# Patient Record
Sex: Male | Born: 1955
Health system: Southern US, Community
[De-identification: ages and names within clinical notes are randomized; demographics above are authoritative.]

## PROBLEM LIST (undated history)

## (undated) DIAGNOSIS — R011 Cardiac murmur, unspecified: Secondary | ICD-10-CM

## (undated) DIAGNOSIS — D649 Anemia, unspecified: Secondary | ICD-10-CM

## (undated) DIAGNOSIS — I499 Cardiac arrhythmia, unspecified: Secondary | ICD-10-CM

## (undated) DIAGNOSIS — K635 Polyp of colon: Secondary | ICD-10-CM

## (undated) DIAGNOSIS — M1990 Unspecified osteoarthritis, unspecified site: Secondary | ICD-10-CM

## (undated) DIAGNOSIS — M109 Gout, unspecified: Secondary | ICD-10-CM

## (undated) DIAGNOSIS — T7840XA Allergy, unspecified, initial encounter: Secondary | ICD-10-CM

## (undated) DIAGNOSIS — I10 Essential (primary) hypertension: Secondary | ICD-10-CM

## (undated) DIAGNOSIS — R0602 Shortness of breath: Secondary | ICD-10-CM

## (undated) DIAGNOSIS — K219 Gastro-esophageal reflux disease without esophagitis: Secondary | ICD-10-CM

## (undated) HISTORY — DX: Cardiac murmur, unspecified: R01.1

## (undated) HISTORY — DX: Shortness of breath: R06.02

## (undated) HISTORY — DX: Polyp of colon: K63.5

## (undated) HISTORY — DX: Essential (primary) hypertension: I10

## (undated) HISTORY — DX: Allergy, unspecified, initial encounter: T78.40XA

## (undated) HISTORY — DX: Gout, unspecified: M10.9

---

## 1988-10-31 HISTORY — PX: LUMBAR DISC SURGERY: SHX700

## 1999-12-06 ENCOUNTER — Encounter: Payer: Self-pay | Admitting: *Deleted

## 1999-12-06 ENCOUNTER — Encounter: Admission: RE | Admit: 1999-12-06 | Discharge: 1999-12-06 | Payer: Self-pay | Admitting: *Deleted

## 2000-01-24 ENCOUNTER — Encounter: Admission: RE | Admit: 2000-01-24 | Discharge: 2000-04-23 | Payer: Self-pay | Admitting: *Deleted

## 2006-08-17 ENCOUNTER — Ambulatory Visit: Payer: Self-pay | Admitting: Family Medicine

## 2006-09-12 ENCOUNTER — Ambulatory Visit: Payer: Self-pay | Admitting: Family Medicine

## 2006-09-19 ENCOUNTER — Ambulatory Visit: Payer: Self-pay | Admitting: Internal Medicine

## 2006-09-30 DIAGNOSIS — K635 Polyp of colon: Secondary | ICD-10-CM

## 2006-09-30 HISTORY — PX: COLONOSCOPY: SHX174

## 2006-09-30 HISTORY — DX: Polyp of colon: K63.5

## 2006-10-09 ENCOUNTER — Encounter (INDEPENDENT_AMBULATORY_CARE_PROVIDER_SITE_OTHER): Payer: Self-pay | Admitting: Specialist

## 2006-10-09 ENCOUNTER — Ambulatory Visit: Payer: Self-pay | Admitting: Internal Medicine

## 2006-10-09 LAB — HM COLONOSCOPY

## 2006-11-20 ENCOUNTER — Ambulatory Visit: Payer: Self-pay | Admitting: Family Medicine

## 2007-02-19 ENCOUNTER — Encounter: Payer: Self-pay | Admitting: Sports Medicine

## 2007-06-04 ENCOUNTER — Ambulatory Visit: Payer: Self-pay | Admitting: Family Medicine

## 2007-07-30 ENCOUNTER — Ambulatory Visit: Payer: Self-pay | Admitting: Family Medicine

## 2007-08-23 ENCOUNTER — Ambulatory Visit: Payer: Self-pay | Admitting: Family Medicine

## 2007-08-29 ENCOUNTER — Ambulatory Visit: Payer: Self-pay | Admitting: Family Medicine

## 2007-12-05 ENCOUNTER — Encounter: Payer: Self-pay | Admitting: Sports Medicine

## 2008-04-30 ENCOUNTER — Ambulatory Visit: Payer: Self-pay | Admitting: Sports Medicine

## 2008-04-30 DIAGNOSIS — M25569 Pain in unspecified knee: Secondary | ICD-10-CM | POA: Insufficient documentation

## 2008-04-30 DIAGNOSIS — M2140 Flat foot [pes planus] (acquired), unspecified foot: Secondary | ICD-10-CM

## 2008-04-30 DIAGNOSIS — IMO0002 Reserved for concepts with insufficient information to code with codable children: Secondary | ICD-10-CM

## 2008-07-24 ENCOUNTER — Ambulatory Visit: Payer: Self-pay | Admitting: Family Medicine

## 2008-08-07 ENCOUNTER — Ambulatory Visit: Payer: Self-pay | Admitting: Family Medicine

## 2009-10-06 ENCOUNTER — Ambulatory Visit: Payer: Self-pay | Admitting: Family Medicine

## 2009-10-31 HISTORY — PX: KNEE ARTHROSCOPY: SUR90

## 2009-12-21 ENCOUNTER — Ambulatory Visit: Payer: Self-pay | Admitting: Family Medicine

## 2010-01-20 ENCOUNTER — Ambulatory Visit: Payer: Self-pay | Admitting: Family Medicine

## 2010-08-20 ENCOUNTER — Ambulatory Visit: Payer: Self-pay | Admitting: Family Medicine

## 2010-09-09 ENCOUNTER — Ambulatory Visit: Payer: Self-pay | Admitting: Family Medicine

## 2010-09-13 ENCOUNTER — Ambulatory Visit: Payer: Self-pay | Admitting: Family Medicine

## 2011-01-10 ENCOUNTER — Ambulatory Visit (INDEPENDENT_AMBULATORY_CARE_PROVIDER_SITE_OTHER): Payer: BC Managed Care – PPO | Admitting: Family Medicine

## 2011-01-10 DIAGNOSIS — K5289 Other specified noninfective gastroenteritis and colitis: Secondary | ICD-10-CM

## 2011-02-14 ENCOUNTER — Ambulatory Visit (INDEPENDENT_AMBULATORY_CARE_PROVIDER_SITE_OTHER): Payer: BC Managed Care – PPO | Admitting: Family Medicine

## 2011-02-14 DIAGNOSIS — M79609 Pain in unspecified limb: Secondary | ICD-10-CM

## 2011-02-14 DIAGNOSIS — I10 Essential (primary) hypertension: Secondary | ICD-10-CM

## 2011-02-14 DIAGNOSIS — K219 Gastro-esophageal reflux disease without esophagitis: Secondary | ICD-10-CM

## 2011-02-14 DIAGNOSIS — Z79899 Other long term (current) drug therapy: Secondary | ICD-10-CM

## 2011-02-14 DIAGNOSIS — M7710 Lateral epicondylitis, unspecified elbow: Secondary | ICD-10-CM

## 2011-04-29 ENCOUNTER — Ambulatory Visit (INDEPENDENT_AMBULATORY_CARE_PROVIDER_SITE_OTHER): Payer: BC Managed Care – PPO | Admitting: Medical

## 2011-04-29 ENCOUNTER — Encounter: Payer: Self-pay | Admitting: Medical

## 2011-04-29 VITALS — BP 122/88 | HR 60 | Temp 98.2°F | Ht 73.0 in | Wt 197.0 lb

## 2011-04-29 DIAGNOSIS — D649 Anemia, unspecified: Secondary | ICD-10-CM

## 2011-04-29 LAB — IBC PANEL
%SAT: 15 % — ABNORMAL LOW (ref 20–55)
TIBC: 456 ug/dL — ABNORMAL HIGH (ref 215–435)
UIBC: 388 ug/dL

## 2011-04-29 LAB — COMPREHENSIVE METABOLIC PANEL
ALT: 17 U/L (ref 0–53)
CO2: 27 mEq/L (ref 19–32)
Calcium: 9.4 mg/dL (ref 8.4–10.5)
Chloride: 102 mEq/L (ref 96–112)
Creat: 0.99 mg/dL (ref 0.50–1.35)
Total Protein: 6.9 g/dL (ref 6.0–8.3)

## 2011-04-29 LAB — POCT URINALYSIS DIPSTICK
Bilirubin, UA: NEGATIVE
Blood, UA: NEGATIVE
Ketones, UA: NEGATIVE
Leukocytes, UA: NEGATIVE
pH, UA: 5

## 2011-04-29 NOTE — Progress Notes (Signed)
  Subjective:   HPI  Matthew [name] is a 56 y.o. male who presents for concern for anemia.  He was anemic mildly back in 4/12 on labs here with no prior history of anemia.  He notes that he donates platelets regularly, about once monthly for years, but recently he was declined twice due to low hemoglobin.  He is here today to investigate this.  He notes that he exercises regularly, 5 days/wk, eats a healthy balanced diet, but denies bleeding.  He does get some fatigue.  He wakes up at times fatigued, and gets some afternoon fatigue. He sometimes takes afternoon nap.  He denies mood or depression issues.  He does snore some but no witnessed apnea.  He denies pallor.  Otherwise has been in his usual state of health.  There are no family hx/o cancer or blood disorders.  His daughter has juvenile dermatomyositis, of note.  No other aggravating or relieving factors.  No other c/o.  The following portions of the patient's history were reviewed and updated as appropriate: allergies, current medications, past family history, past medical history, past social history, past surgical history and problem list. Past Medical History  Diagnosis Date  . Allergy   . Colonic polyp 09/2006    colonoscopy  . Hypertension     Review of Systems Constitutional: denies fever, chills, sweats, unexpected weight change, anorexia Dermatology: denies rash ENT: no runny nose, ear pain, sore throat, hoarseness, sinus pain Cardiology: denies chest pain, palpitations, edema Respiratory: denies cough, shortness of breath, wheezing Gastroenterology: denies abdominal pain, nausea, vomiting, diarrhea, constipation Hematology: denies bleeding or bruising problems Musculoskeletal: denies arthralgias, myalgias, joint swelling, back pain Urology: denies dysuria, difficulty urinating, hematuria, urinary frequency, urgency     Objective:   Physical Exam  General appearance: alert, no distress, WD/WN, white male Skin: warm dry,  no pallor Neck: supple, no lymphadenopathy, no thyromegaly, no masses Heart: RRR, normal S1, S2, no murmurs Lungs: CTA bilaterally, no wheezes, rhonchi, or rales Abdomen: +bs, soft, non tender, non distended, no masses, no hepatomegaly, no splenomegaly Extremities: no edema, no cyanosis, no clubbing Pulses: 2+ symmetric, upper and lower extremities, normal cap refill Rectal: anus normal, no obvious hemorrhoids, prostate WNL, occult negative stool  Assessment :    Encounter Diagnosis  Name Primary?  [name] Anemia Yes    Plan:    Discussed possible etiologies and possible workup.  Labs today, and if still anemic and no obvious source, will possibly need repeat GI workup.  He is due for repeat colonoscopy this year anyway.  Stool and urine negative today for blood.

## 2011-04-29 NOTE — Patient Instructions (Signed)
We will call with lab results and plan.

## 2011-04-30 LAB — CBC WITH DIFFERENTIAL/PLATELET
Basophils Absolute: 0 10*3/uL (ref 0.0–0.1)
HCT: 36.3 % — ABNORMAL LOW (ref 39.0–52.0)
Lymphocytes Relative: 27 % (ref 12–46)
Neutro Abs: 2.5 10*3/uL (ref 1.7–7.7)
Platelets: 300 10*3/uL (ref 150–400)
RDW: 14.3 % (ref 11.5–15.5)
WBC: 4.2 10*3/uL (ref 4.0–10.5)

## 2011-05-02 ENCOUNTER — Telehealth: Payer: Self-pay | Admitting: *Deleted

## 2011-05-02 ENCOUNTER — Other Ambulatory Visit: Payer: Self-pay | Admitting: Medical

## 2011-05-02 MED ORDER — FERROUS GLUCONATE 325 MG PO TABS: 325.0000 mg | ORAL_TABLET | Freq: Two times a day (BID) | ORAL | Status: DC

## 2011-05-02 NOTE — Telephone Encounter (Deleted)
Message copied by Dorthula Perfect on Mon May 02, 2011  5:01 PM ------      Message from: Jac Canavan      Created: Mon May 02, 2011  1:35 PM       His labs show low hemoglobin and red cells, iron and ferritin are at the low end of normal.  Otherwise liver, kidney, lytes, urine, B12, folate all normal.   At this point, since source of this is not known, lets refer back to GI.  He saw Dr. Yancey Flemings, [facility] for his colonoscopy in 2007.  If he is agreeable, refer back there for c/o anemia, source unknown to rule out GI bleed.  Send OV notes, labs, etc.  Also, I would recommend he begin iron for now.  I'll send script.  Have him take it once or twice daily with orange juice or food.  If it constipates him too bad, then try either once daily or 1/2 tablet BID.

## 2011-05-02 NOTE — Telephone Encounter (Addendum)
Message copied by Dorthula Perfect on Mon May 02, 2011  5:03 PM ------      Message from: Jac Canavan      Created: Mon May 02, 2011  1:35 PM       His labs show low hemoglobin and red cells, iron and ferritin are at the low end of normal.  Otherwise liver, kidney, lytes, urine, B12, folate all normal.   At this point, since source of this is not known, lets refer back to GI.  He saw Dr. Yancey Flemings, [facility] for his colonoscopy in 2007.  If he is agreeable, refer back there for c/o anemia, source unknown to rule out GI bleed.  Send OV notes, labs, etc.  Also, I would recommend he begin iron for now.  I'll send script.  Have him take it once or twice daily with orange juice or food.  If it constipates him too bad, then try either once daily or 1/2 tablet BID.   Pt notified of lab results.  Informed pt to take iron, and that rx was sent to pharmacy.  Will schedule appointment with GI Dr. Marina Goodell and let pt know of appointment.  CM,LPN

## 2011-05-03 ENCOUNTER — Encounter: Payer: Self-pay | Admitting: Internal Medicine

## 2011-05-03 ENCOUNTER — Telehealth: Payer: Self-pay | Admitting: Internal Medicine

## 2011-05-03 ENCOUNTER — Telehealth: Payer: Self-pay | Admitting: *Deleted

## 2011-05-03 NOTE — Telephone Encounter (Addendum)
Message copied by Dorthula Perfect on Tue May 03, 2011 10:25 AM ------      Message from: Aleen Campi, DAVID S      Created: Mon May 02, 2011  1:35 PM       His labs show low hemoglobin and red cells, iron and ferritin are at the low end of normal.  Otherwise liver, kidney, lytes, urine, B12, folate all normal.   At this point, since source of this is not known, lets refer back to GI.  He saw Dr. Yancey Flemings, [facility] for his colonoscopy in 2007.  If he is agreeable, refer back there for c/o anemia, source unknown to rule out GI bleed.  Send OV notes, labs, etc.  Also, I would recommend he begin iron for now.  I'll send script.  Have him take it once or twice daily with orange juice or food.  If it constipates him too bad, then try either once daily or 1/2 tablet BID.    Pt notified of all lab results.  Informed pt that rx for iron was sent to pharmacy and pt agreed to start taking the iron.  Appointment was scheduled with Dr. Marina Goodell GI on 06-15-11 at 3:30pm.  Pt was notified of appointment. CM, LPN

## 2011-05-03 NOTE — Telephone Encounter (Signed)
Pt would like to be seen, his Hgb and Ferritin level were low with his primary care visit. Pt had colon with Dr. Marina Goodell in 2007. Requesting sooner appt than first available. Appt made for pt to see Mike Gip PA 05/06/11@9 :30am when Dr. Marina Goodell is the supervising physician. Pt aware of appt date and time.

## 2011-05-06 ENCOUNTER — Encounter: Payer: Self-pay | Admitting: Physician Assistant

## 2011-05-06 ENCOUNTER — Ambulatory Visit (INDEPENDENT_AMBULATORY_CARE_PROVIDER_SITE_OTHER): Payer: BC Managed Care – PPO | Admitting: Physician Assistant

## 2011-05-06 VITALS — BP 102/80 | HR 80 | Ht 73.0 in | Wt 199.0 lb

## 2011-05-06 DIAGNOSIS — D500 Iron deficiency anemia secondary to blood loss (chronic): Secondary | ICD-10-CM

## 2011-05-06 DIAGNOSIS — D509 Iron deficiency anemia, unspecified: Secondary | ICD-10-CM

## 2011-05-06 DIAGNOSIS — Z860100 Personal history of colon polyps, unspecified: Secondary | ICD-10-CM

## 2011-05-06 MED ORDER — PEG-KCL-NACL-NASULF-NA ASC-C 100 G PO SOLR: ORAL | Status: DC

## 2011-05-06 NOTE — Progress Notes (Signed)
Agree with endoscopic workup for iron deficiency anemia.

## 2011-05-06 NOTE — Progress Notes (Signed)
Subjective:    Patient ID: Matthew [name], male    DOB: 1956-02-19, 55 y.o.   MRN: [id number]  HPI Matthew [name] is a pleasant 55 year old white male known to Dr. Marina Goodell from colonoscopy done in December of 2007. This was done for screening and he was found to have one small polyp in the splenic flexure which was adenomatous and a single cecal diverticulum. He has not been seen here since.  Patient had recent labs done through his primary care physician and was found to have a mild anemia with hemoglobin of 11.5 hematocrit of 36.3 MCV of 87.9 and platelets of 300 subsequent iron studies show a ferritin of 6 iron saturation of 15 TIBC 456 B12 and folate levels were normal. He has started on an iron supplement her sulfate 325 twice daily. He had rectal exam done within the past week or so which was Hemoccult-negative. He really has no symptoms from a GI standpoint and complains only of mild fatigue. He says that he has been generally healthy he eats very healthy in general he tries to keep crit. He has no complaints of heartburn indigestion dysphagia or odynophagia. Appetite has been fine weight has been stable, he has not noted any changes in his bowel habits nor any melena or hematochezia. He does take an occasional Advil though not daily and takes one baby aspirin daily.  He does relate that he has been a plasma donor for many years has been turned down twice recently.    Review of Systems  Constitutional: Positive for fatigue.  HENT: Negative.   Eyes: Negative.   Respiratory: Negative.   Cardiovascular: Negative.   Gastrointestinal: Negative.   Genitourinary: Negative.   Musculoskeletal: Negative.   Skin: Negative.   Neurological: Negative.   Hematological: Negative.   Psychiatric/Behavioral: Negative.    Outpatient Encounter Prescriptions as of 05/06/2011  Medication Sig Dispense Refill  . aspirin 81 MG tablet Take 81 mg by mouth daily.        . cetirizine (ZYRTEC) 10 MG tablet Take 10 mg by  mouth daily.        . ferrous gluconate (FERGON) 325 MG tablet Take 1 tablet (325 mg total) by mouth 2 (two) times daily.  60 tablet  2  . fish oil-omega-3 fatty acids 1000 MG capsule Take 2 g by mouth daily.        . fluticasone (FLONASE) 50 MCG/ACT nasal spray Place 2 sprays into the nose as needed.        [name] lisinopril-hydrochlorothiazide (PRINZIDE,ZESTORETIC) 10-12.5 MG per tablet Take 1 tablet by mouth daily.        . Multiple Vitamin (MULTIVITAMIN) tablet Take 1 tablet by mouth daily.        [name] omeprazole (PRILOSEC) 20 MG capsule Take 20 mg by mouth daily.        . peg 3350 powder (MOVIPREP) 100 G SOLR Take as directed. Moviprep  1 kit  0   Allergies  Allergen Reactions  . Demerol        Objective:   Physical Exam Well-developed white male in no acute distress, pleasant, accompanied by his wife. Alert and oriented x3; HEENT; nontraumatic, normocephalic EOMI PERRLA sclera anicteric, Neck; supple no JVD, Cardiovascular; regular rate and rhythm with S1-S2 soft systolic murmurM Pulmonary; clear bilaterally Abdomen; soft nontender nondistended, bowel sounds active, no palpable mass or hepatosplenomegaly  Rectal; not done, recent rectal per primary care Hemoccult-negative, Skin warm and dry benign, Psych; mood and affect normal an appropriate.  Assessment & Plan:  #[age] year old male with history of adenomatous colon polyp 2007, now with new diagnosis of iron deficiency anemia-Hemoccult-negative. Rule out intermittent slow GI blood loss, rule out malabsorption.  Plan; Continue ferrous sulfate 325 mg one twice daily, patient was advised to have his iron counts repeated in 3 months. Schedule for upper endoscopy with possible small bowel biopsies and colonoscopy with Dr. Yancey Flemings. Procedure discussed in detail with the patient and his wife and they are agreeable to proceed. Further workup pending findings of above.

## 2011-05-06 NOTE — Patient Instructions (Signed)
We have scheduled the colonoscopy/Endoscopy with Dr. Yancey Flemings at [facility]. Directions and brochure provided. We have sent a prescription for the Moviprep to [facility].

## 2011-05-10 ENCOUNTER — Other Ambulatory Visit: Payer: Self-pay | Admitting: Internal Medicine

## 2011-05-10 ENCOUNTER — Encounter: Payer: BC Managed Care – PPO | Admitting: Internal Medicine

## 2011-05-10 ENCOUNTER — Ambulatory Visit (HOSPITAL_COMMUNITY)
Admission: RE | Admit: 2011-05-10 | Discharge: 2011-05-10 | Disposition: A | Discharge status: Home / Self Care | Payer: BC Managed Care – PPO | Source: Ambulatory Visit | Attending: Internal Medicine | Admitting: Internal Medicine

## 2011-05-10 DIAGNOSIS — D509 Iron deficiency anemia, unspecified: Secondary | ICD-10-CM

## 2011-05-10 DIAGNOSIS — K5730 Diverticulosis of large intestine without perforation or abscess without bleeding: Secondary | ICD-10-CM | POA: Insufficient documentation

## 2011-05-10 DIAGNOSIS — Z860100 Personal history of colon polyps, unspecified: Secondary | ICD-10-CM | POA: Insufficient documentation

## 2011-05-10 LAB — HM COLONOSCOPY

## 2011-05-13 ENCOUNTER — Telehealth: Payer: Self-pay

## 2011-05-13 ENCOUNTER — Other Ambulatory Visit: Payer: Self-pay | Admitting: Internal Medicine

## 2011-05-13 DIAGNOSIS — D649 Anemia, unspecified: Secondary | ICD-10-CM

## 2011-05-13 NOTE — Telephone Encounter (Signed)
Pt scheduled to come for capsule teaching 05/17/11@2pm . Capsule endo scheduled for 05/24/11@8am . Paperwork to Dr. Marina Goodell to complete.

## 2011-05-13 NOTE — Telephone Encounter (Signed)
Message copied by Michele Mcalpine on Fri May 13, 2011  2:07 PM ------      Message from: Hilarie Fredrickson      Created: Thu May 12, 2011 10:53 AM       Bonita Quin, set this patient up for a capsule endoscopy. Thanks             jp                  ----- Message -----         From: [facility] In [facility] Interface         Sent: 05/11/2011   5:57 PM           To: Yancey Flemings, MD

## 2011-05-13 NOTE — Telephone Encounter (Signed)
Left message for pt to call back  °

## 2011-05-16 ENCOUNTER — Telehealth: Payer: Self-pay | Admitting: Internal Medicine

## 2011-05-16 ENCOUNTER — Telehealth: Payer: Self-pay | Admitting: Medical

## 2011-05-16 NOTE — Telephone Encounter (Signed)
If that is his wish, okay. This would not exclude the possibility of occult small bowel pathology causing iron deficiency anemia. I explained this to him previously, prior to his recent procedures.

## 2011-05-16 NOTE — Telephone Encounter (Signed)
Please call re need for lab tests. Pt saw Dr. Marina Goodell today, pt wants to wait on further testing and see what iron supplements are doing  First. When does he need labs repeated

## 2011-05-16 NOTE — Telephone Encounter (Signed)
Pt aware.

## 2011-05-16 NOTE — Telephone Encounter (Signed)
Pt would like to cancel his appt for the capsule teaching and endo. Pt thinks that perhaps his problem was due to taking an asa daily and doing the phoresis. He would like to wait 30-45 days and take the iron pills and see how his blood work looks. If he is still having problems he would then schedule the procedure.

## 2011-05-17 ENCOUNTER — Telehealth: Payer: Self-pay | Admitting: Medical

## 2011-05-17 NOTE — Telephone Encounter (Signed)
Pls call pt - if need be, you can get me on the phone regarding this:  I reviewed the GI note where they want to proceed with EGD and Colonoscopy.   As far as repeated blood work, I usually recheck a blood count in 28mo.  Regardless of whether the iron brings up the blood count or not, this still doesn't answer the question as to why he is anemic.  That is why the EGD and Colonoscopy is recommended.  I'll be glad to get on the phone and speak to him.

## 2011-05-17 NOTE — Telephone Encounter (Signed)
Pt had EGD and colonoscopy that were normal, biopsies were taken.  GI advised that they felt between him being on Aspirin and also him having plasma donations regularly contributed to the drop in H/H.  They said that the next step would be capsule endoscopy, but they also agreed that a conservative approach would be to c/t the iron for now, hold off on the plasma donations for now, and recheck CBC in 29mo.  We will go with this plan for now.  He will return end of the month for CBC no diff.  C/t iron for at least 43mo.

## 2011-06-15 ENCOUNTER — Ambulatory Visit: Payer: BC Managed Care – PPO | Admitting: Internal Medicine

## 2011-06-21 ENCOUNTER — Other Ambulatory Visit: Payer: BC Managed Care – PPO

## 2011-06-21 DIAGNOSIS — R7989 Other specified abnormal findings of blood chemistry: Secondary | ICD-10-CM

## 2011-06-22 LAB — CBC
Platelets: 323 10*3/uL (ref 150–400)
RBC: 4.44 MIL/uL (ref 4.22–5.81)
RDW: 15.8 % — ABNORMAL HIGH (ref 11.5–15.5)
WBC: 6.7 10*3/uL (ref 4.0–10.5)

## 2011-06-28 ENCOUNTER — Telehealth: Payer: Self-pay | Admitting: *Deleted

## 2011-06-28 NOTE — Telephone Encounter (Addendum)
Message copied by Dorthula Perfect on Tue Jun 28, 2011 11:50 AM ------      Message from: Aleen Campi, DAVID S      Created: Mon Jun 27, 2011  2:33 PM       Blood count, hemoglobin back to normal.  C/t iron for another 2 months as we discussed.   We can recheck CBC again in 2-3 mo.  I would hold off for at least another month or longer depending upon what GI told him regarding the plasma donations.   Pt informed of lab results.  Will call back to schedule a nurse visit to recheck CBC in 2-3 months.  CM, LPN

## 2011-07-25 ENCOUNTER — Ambulatory Visit (INDEPENDENT_AMBULATORY_CARE_PROVIDER_SITE_OTHER): Payer: BC Managed Care – PPO | Admitting: Family Medicine

## 2011-07-25 ENCOUNTER — Encounter: Payer: Self-pay | Admitting: Family Medicine

## 2011-07-25 VITALS — BP 120/82 | HR 68 | Wt 200.0 lb

## 2011-07-25 DIAGNOSIS — I498 Other specified cardiac arrhythmias: Secondary | ICD-10-CM

## 2011-07-25 DIAGNOSIS — D649 Anemia, unspecified: Secondary | ICD-10-CM

## 2011-07-25 DIAGNOSIS — R008 Other abnormalities of heart beat: Secondary | ICD-10-CM

## 2011-07-25 DIAGNOSIS — J301 Allergic rhinitis due to pollen: Secondary | ICD-10-CM

## 2011-07-25 MED ORDER — MONTELUKAST SODIUM 10 MG PO TABS: 10.0000 mg | ORAL_TABLET | Freq: Every day | ORAL | Status: DC

## 2011-07-25 MED ORDER — METHYLPREDNISOLONE SODIUM SUCC 125 MG IJ SOLR: 125.0000 mg | Freq: Once | INTRAMUSCULAR | Status: DC

## 2011-07-25 MED ORDER — CICLESONIDE 50 MCG/ACT NA SUSP: 2.0000 | Freq: Every day | NASAL | Status: DC

## 2011-07-25 NOTE — Progress Notes (Signed)
  Subjective:    Patient ID: Matthew [name], male    DOB: [birth year], 55 y.o.   MRN: [id number]  HPI He is here for evaluation of his allergies. He is having sneezing, itchy watery eyes, rhinorrhea, ear congestion and sinus pressure with PND and sore throat. He is on a nasal steroid and an OTC allergy med. He does not smoke. He also needs followup on his anemia. He did have workup including colonoscopy which was negative.   Review of Systems     Objective:   Physical Exam alert and in no distress. Tympanic membranes and canals are normal. Throat is clear. Tonsils are normal. Neck is supple without adenopathy or thyromegaly. Cardiac exam shows a regular sinus rhythm without murmurs , pain gallop type rhythm with a questionable splitting of S1. Lungs are clear to auscultation. EKG shows a conduction disturbance,LAH; right ventricular conduction delay       Assessment & Plan:   1. Allergic rhinitis due to pollen    2. Gallop rhythm  2D Echocardiogram without contrast  3. Anemia  CBC with Differential   He requested a steroid injection to help with the allergies. Will also switch him to [facility] and give Singulair. Will also use Allegra. CBC was canceled since he had blood work 3 weeks prior to the visit. He will also be sent to cardiology for further evaluation of his abnormal EKG as well as auscultation.

## 2011-07-25 NOTE — Patient Instructions (Signed)
Switch to Allegra and try the Omnaris and Singulair on a regular basis. He will probably need to take this through the first frost

## 2011-07-29 ENCOUNTER — Other Ambulatory Visit: Payer: Self-pay | Admitting: Family Medicine

## 2011-07-29 MED ORDER — METHYLPREDNISOLONE SODIUM SUCC 125 MG IJ SOLR: 125.0000 mg | Freq: Once | INTRAMUSCULAR | Status: DC

## 2011-07-29 NOTE — Progress Notes (Signed)
Addended by: Lavell Islam on: 07/29/2011 01:10 PM   Modules accepted: Orders

## 2011-08-04 ENCOUNTER — Telehealth: Payer: Self-pay | Admitting: Family Medicine

## 2011-08-04 NOTE — Telephone Encounter (Signed)
DONE

## 2011-08-09 DIAGNOSIS — Z0279 Encounter for issue of other medical certificate: Secondary | ICD-10-CM

## 2011-08-16 ENCOUNTER — Telehealth: Payer: Self-pay | Admitting: Family Medicine

## 2011-08-16 NOTE — Telephone Encounter (Signed)
I talked to him and will have him use Advil 800 3 times a day and call me in several days

## 2011-08-18 ENCOUNTER — Telehealth: Payer: Self-pay | Admitting: Family Medicine

## 2011-08-19 NOTE — Telephone Encounter (Signed)
DONE

## 2011-09-20 ENCOUNTER — Encounter: Payer: Self-pay | Admitting: Family Medicine

## 2011-09-20 ENCOUNTER — Ambulatory Visit (INDEPENDENT_AMBULATORY_CARE_PROVIDER_SITE_OTHER): Payer: BC Managed Care – PPO | Admitting: Family Medicine

## 2011-09-20 VITALS — BP 130/90 | HR 62 | Wt 195.0 lb

## 2011-09-20 DIAGNOSIS — M542 Cervicalgia: Secondary | ICD-10-CM

## 2011-09-20 MED ORDER — CARISOPRODOL 350 MG PO TABS: 350.0000 mg | ORAL_TABLET | Freq: Three times a day (TID) | ORAL | Status: AC | PRN

## 2011-09-20 NOTE — Progress Notes (Signed)
  Subjective:    Patient ID: Matthew [name], male    DOB: 1956/07/04, 55 y.o.   MRN: [id number]  HPI He has had difficulty with neck pain for the last month it started while he was running. The pain is more on the left. Is made worse with certain motions. He has no numbness tingling or weakness in his arms  Review of Systems     Objective:   Physical Exam Full range of motion of his neck. No palpable tenderness noted. Normal motor, sensory and DTRs.       Assessment & Plan:  Neck pain probably muscular: Discussed various therapies including a different chiropractor for manipulation versus physical therapy. He is decided to go to physical therapy for this.

## 2011-09-20 NOTE — Progress Notes (Signed)
Addended by: Ronnald Nian on: 09/20/2011 01:08 PM   Modules accepted: Orders

## 2011-09-20 NOTE — Progress Notes (Signed)
  Subjective:    Patient ID: Matthew [name], male    DOB: 1956-06-15, 55 y.o.   MRN: [id number]  HPI    Review of Systems     Objective:   Physical Exam        Assessment & Plan:  I will call inSoma compound for him. Also recommend he use Aleve 2 pills twice a day as well as heat 20 minutes 3 times per day

## 2011-09-23 ENCOUNTER — Telehealth: Payer: Self-pay | Admitting: Internal Medicine

## 2011-09-23 MED ORDER — ZOLPIDEM TARTRATE 10 MG PO TABS: 10.0000 mg | ORAL_TABLET | Freq: Every evening | ORAL | Status: DC | PRN

## 2011-09-23 NOTE — Telephone Encounter (Signed)
Called amb[facility] 10mg  [facility]

## 2011-09-23 NOTE — Telephone Encounter (Signed)
Renew the zolpedim

## 2011-10-07 ENCOUNTER — Other Ambulatory Visit: Payer: Self-pay | Admitting: Dermatology

## 2011-10-08 ENCOUNTER — Encounter: Payer: Self-pay | Admitting: Internal Medicine

## 2011-10-27 ENCOUNTER — Other Ambulatory Visit (INDEPENDENT_AMBULATORY_CARE_PROVIDER_SITE_OTHER): Payer: BC Managed Care – PPO

## 2011-10-27 DIAGNOSIS — Z23 Encounter for immunization: Secondary | ICD-10-CM

## 2011-12-02 ENCOUNTER — Telehealth: Payer: Self-pay | Admitting: Family Medicine

## 2011-12-02 MED ORDER — ZOLPIDEM TARTRATE 10 MG PO TABS: 10.0000 mg | ORAL_TABLET | Freq: Every evening | ORAL | Status: DC | PRN

## 2011-12-02 NOTE — Telephone Encounter (Signed)
Called in [place] 10mg  #30 no refills

## 2011-12-02 NOTE — Telephone Encounter (Signed)
fyi

## 2012-02-06 ENCOUNTER — Other Ambulatory Visit: Payer: Self-pay | Admitting: Family Medicine

## 2012-02-22 ENCOUNTER — Ambulatory Visit (INDEPENDENT_AMBULATORY_CARE_PROVIDER_SITE_OTHER): Payer: BC Managed Care – PPO | Admitting: Medical

## 2012-02-22 ENCOUNTER — Encounter: Payer: Self-pay | Admitting: Medical

## 2012-02-22 ENCOUNTER — Encounter: Payer: Self-pay | Admitting: Internal Medicine

## 2012-02-22 VITALS — BP 118/80 | HR 60 | Temp 98.0°F | Resp 16 | Wt 196.0 lb

## 2012-02-22 DIAGNOSIS — J40 Bronchitis, not specified as acute or chronic: Secondary | ICD-10-CM

## 2012-02-22 DIAGNOSIS — J329 Chronic sinusitis, unspecified: Secondary | ICD-10-CM

## 2012-02-22 DIAGNOSIS — G4700 Insomnia, unspecified: Secondary | ICD-10-CM

## 2012-02-22 MED ORDER — AMOXICILLIN-POT CLAVULANATE 875-125 MG PO TABS: 1.0000 | ORAL_TABLET | Freq: Two times a day (BID) | ORAL | Status: AC

## 2012-02-22 NOTE — Progress Notes (Signed)
Subjective:  Matthew [name] is a 56 y.o. male who presents for 5 day hx/o worsening symptoms.  Started with cough and chest congestion, but over last few days worsening head symptoms including sinus, cough with colored sputum, ears popping, running nose, scratchy throat.  Denies fever, but feels yucky.  Denies chills, sweats, NVD.  Daughter had bronchitis recently.  Using some Advil and hydrating well. No other aggravating or relieving factors.   He also notes problems with sleep.  In the past Dr. Susann Givens here has prescribe sleep aid Ambien, but this made him too groggy next day.  Has used periodically.  He notes 9-10pm falls asleep hard, then about 1:30-2 up every hour.  Has always been light sleeper.  Wife does sometimes watch tv in bed once he has went to sleep.  He notes no problems getting to sleep, just can't stay asleep.   Objective:   Filed Vitals:   02/22/12 1528  BP: 118/80  Pulse: 60  Temp: 98 F (36.7 C)  Resp: 16    General appearance: Alert, WD/WN, no distress                             Skin: warm, no rash                           Head: + mild sinus tenderness,                            Eyes: conjunctiva normal, corneas clear, PERRLA                            Ears: pearly TMs, external ear canals normal                          Nose: septum midline, turbinates swollen, with erythema and clear discharge             Mouth/throat: MMM, tongue normal, mild pharyngeal erythema                           Neck: supple, no adenopathy, no thyromegaly, nontender                          Heart: RRR, normal S1, S2, no murmurs                         Lungs: CTA bilaterally, no wheezes, rales, or rhonchi      Assessment and Plan:   Encounter Diagnoses  Name Primary?  . Sinobronchitis Yes  . Insomnia    Prescription given for Augmentin.  Can use OTC Mucinex for congestion.  Tylenol or Ibuprofen OTC for fever and malaise.  Discussed symptomatic relief, nasal saline, and call or  return if worse or not improving in 2-3 days.     Insomnia - discussed sleep hygeine, exercise, and begin trial of Melatonin OTC QHS.

## 2012-02-22 NOTE — Patient Instructions (Signed)
Sinobronchial infection - rest, drink plenty of fluids, salt water gargles, consider Mucinex DM or coricidin HBP for congestion, and then begin Augmentin twice daily for 10 days.  Call if not improving.   Sleep - consider melatonin 1mg  OTC at bedtime daily.   Insomnia Insomnia is frequent trouble falling and/or staying asleep. Insomnia can be a long term problem or a short term problem. Both are common. Insomnia can be a short term problem when the wakefulness is related to a certain stress or worry. Long term insomnia is often related to ongoing stress during waking hours and/or poor sleeping habits. Overtime, sleep deprivation itself can make the problem worse. Every little thing feels more severe because you are overtired and your ability to cope is decreased. CAUSES   Stress, anxiety, and depression.   Poor sleeping habits.   Distractions such as TV in the bedroom.   Naps close to bedtime.   Engaging in emotionally charged conversations before bed.   Technical reading before sleep.   Alcohol and other sedatives. They may make the problem worse. They can hurt normal sleep patterns and normal dream activity.   Stimulants such as caffeine for several hours prior to bedtime.   Pain syndromes and shortness of breath can cause insomnia.   Exercise late at night.   Changing time zones may cause sleeping problems (jet lag).  It is sometimes helpful to have someone observe your sleeping patterns. They should look for periods of not breathing during the night (sleep apnea). They should also look to see how long those periods last. If you live alone or observers are uncertain, you can also be observed at a sleep clinic where your sleep patterns will be professionally monitored. Sleep apnea requires a checkup and treatment. Give your caregivers your medical history. Give your caregivers observations your family has made about your sleep.  SYMPTOMS   Not feeling rested in the morning.    Anxiety and restlessness at bedtime.   Difficulty falling and staying asleep.  TREATMENT   Your caregiver may prescribe treatment for an underlying medical disorders. Your caregiver can give advice or help if you are using alcohol or other drugs for self-medication. Treatment of underlying problems will usually eliminate insomnia problems.   Medications can be prescribed for short time use. They are generally not recommended for lengthy use.   Over-the-counter sleep medicines are not recommended for lengthy use. They can be habit forming.   You can promote easier sleeping by making lifestyle changes such as:   Using relaxation techniques that help with breathing and reduce muscle tension.   Exercising earlier in the day.   Changing your diet and the time of your last meal. No night time snacks.   Establish a regular time to go to bed.   Counseling can help with stressful problems and worry.   Soothing music and white noise may be helpful if there are background noises you cannot remove.   Stop tedious detailed work at least one hour before bedtime.  HOME CARE INSTRUCTIONS   Keep a diary. Inform your caregiver about your progress. This includes any medication side effects. See your caregiver regularly. Take note of:   Times when you are asleep.   Times when you are awake during the night.   The quality of your sleep.   How you feel the next day.  This information will help your caregiver care for you.  Get out of bed if you are still awake after 15 minutes.  Read or do some quiet activity. Keep the lights down. Wait until you feel sleepy and go back to bed.   Keep regular sleeping and waking hours. Avoid naps.   Exercise regularly.   Avoid distractions at bedtime. Distractions include watching television or engaging in any intense or detailed activity like attempting to balance the household checkbook.   Develop a bedtime ritual. Keep a familiar routine of bathing,  brushing your teeth, climbing into bed at the same time each night, listening to soothing music. Routines increase the success of falling to sleep faster.   Use relaxation techniques. This can be using breathing and muscle tension release routines. It can also include visualizing peaceful scenes. You can also help control troubling or intruding thoughts by keeping your mind occupied with boring or repetitive thoughts like the old concept of counting sheep. You can make it more creative like imagining planting one beautiful flower after another in your backyard garden.   During your day, work to eliminate stress. When this is not possible use some of the previous suggestions to help reduce the anxiety that accompanies stressful situations.  MAKE SURE YOU:   Understand these instructions.   Will watch your condition.   Will get help right away if you are not doing well or get worse.  Document Released: 10/14/2000 Document Revised: 10/06/2011 Document Reviewed: 11/14/2007 [facility] Patient Information 2012 [place], [place].

## 2012-02-29 ENCOUNTER — Other Ambulatory Visit: Payer: Self-pay | Admitting: Family Medicine

## 2012-06-28 ENCOUNTER — Telehealth: Payer: Self-pay | Admitting: Family Medicine

## 2012-06-29 MED ORDER — VALACYCLOVIR HCL 1 G PO TABS: 1000.0000 mg | ORAL_TABLET | Freq: Every day | ORAL | Status: DC

## 2012-06-29 NOTE — Telephone Encounter (Signed)
Valtrex given for prevention

## 2012-07-27 ENCOUNTER — Telehealth: Payer: Self-pay | Admitting: Medical

## 2012-07-27 NOTE — Telephone Encounter (Signed)
Not sure about the cause.  Can c/t Ibuprofen, can actually use this 3 times daily if needed for a few days.  If he has calve/leg selling or redness or numbness may want to go to urgent care, but if just soreness, c/t Ibuprofen, increase water intake, consider eating bananas and drinking milk a few days for extra potassium in case its a cramp.

## 2012-07-27 NOTE — Telephone Encounter (Signed)
Pt called and I advised of Matthew [name]'s note.  Pt has ov here on Tuesday.

## 2012-07-28 ENCOUNTER — Ambulatory Visit (INDEPENDENT_AMBULATORY_CARE_PROVIDER_SITE_OTHER): Payer: BC Managed Care – PPO | Admitting: Family Medicine

## 2012-07-28 VITALS — BP 134/86 | HR 72 | Temp 97.3°F | Resp 16 | Ht 72.5 in | Wt 200.4 lb

## 2012-07-28 DIAGNOSIS — M79606 Pain in leg, unspecified: Secondary | ICD-10-CM

## 2012-07-28 DIAGNOSIS — M79609 Pain in unspecified limb: Secondary | ICD-10-CM

## 2012-07-28 LAB — POCT CBC
Granulocyte percent: 62.2 %G (ref 37–80)
MCH, POC: 29.9 pg (ref 27–31.2)
MCV: 94.6 fL (ref 80–97)
MID (cbc): 0.6 (ref 0–0.9)
MPV: 8.5 fL (ref 0–99.8)
POC LYMPH PERCENT: 29 %L (ref 10–50)
POC MID %: 8.8 %M (ref 0–12)
Platelet Count, POC: 326 10*3/uL (ref 142–424)
RBC: 4.52 M/uL — AB (ref 4.69–6.13)
RDW, POC: 13.2 %

## 2012-07-28 LAB — COMPREHENSIVE METABOLIC PANEL
AST: 29 U/L (ref 0–37)
Alkaline Phosphatase: 47 U/L (ref 39–117)
BUN: 10 mg/dL (ref 6–23)
Glucose, Bld: 99 mg/dL (ref 70–99)
Potassium: 4 mEq/L (ref 3.5–5.3)
Sodium: 140 mEq/L (ref 135–145)
Total Bilirubin: 0.6 mg/dL (ref 0.3–1.2)

## 2012-07-28 MED ORDER — DICLOFENAC SODIUM 75 MG PO TBEC: 75.0000 mg | DELAYED_RELEASE_TABLET | Freq: Two times a day (BID) | ORAL | Status: DC

## 2012-07-28 MED ORDER — METHOCARBAMOL 750 MG PO TABS: ORAL_TABLET | ORAL | Status: DC

## 2012-07-28 NOTE — Progress Notes (Addendum)
Subjective: Patient has been having pain for the past week. It started with a mild pain in his right buttock or lateral hip area. It then moved down into the lateral thigh. Then the last few days is moving down into the is no injuries. He is a runner sometimes, but he he last ran a couple days before the pain started. Played golf yesterday it hurt at the store but didn't let up. It is been bothering him a lot today. He has at times severe pain deep in the right lateral shin or calf area. He is not in the calf it's more in the shin.  Objective: Good range of motion of the spine. Nontender. He has had a laminectomy. Straight leg raise test negative. Is not acutely tender in the hip or thigh. Range of motion. He's got some tenderness in the right lateral lower leg. Pedal pulses good. No edema.  Assessment: Right leg pain etiology unclear  Plan: CPK CBC . He wondered about being metabolicly  out of line but I do not believe that is the case.  Results for orders placed in visit on 07/28/12  POCT CBC      Component Value Range   WBC 6.6  4.6 - 10.2 K/uL   Lymph, poc 1.9  0.6 - 3.4   POC LYMPH PERCENT 29.0  10 - 50 %L   MID (cbc) 0.6  0 - 0.9   POC MID % 8.8  0 - 12 %M   POC Granulocyte 4.1  2 - 6.9   Granulocyte percent 62.2  37 - 80 %G   RBC 4.52 (*) 4.69 - 6.13 M/uL   Hemoglobin 13.5 (*) 14.1 - 18.1 g/dL   HCT, POC 16.1 (*) 09.6 - 53.7 %   MCV 94.6  80 - 97 fL   MCH, POC 29.9  27 - 31.2 pg   MCHC 31.5 (*) 31.8 - 35.4 g/dL   RDW, POC 04.5     Platelet Count, POC 326  142 - 424 K/uL   MPV 8.5  0 - 99.8 fL

## 2012-07-28 NOTE — Patient Instructions (Signed)
Taking medicine for muscle relaxation and anti-inflammatory effect. If symptoms get worse return.

## 2012-07-30 ENCOUNTER — Encounter: Payer: Self-pay | Admitting: Medical

## 2012-07-30 ENCOUNTER — Ambulatory Visit (INDEPENDENT_AMBULATORY_CARE_PROVIDER_SITE_OTHER): Payer: BC Managed Care – PPO | Admitting: Medical

## 2012-07-30 VITALS — BP 130/80 | HR 60 | Temp 98.2°F | Wt 200.0 lb

## 2012-07-30 DIAGNOSIS — M79609 Pain in unspecified limb: Secondary | ICD-10-CM

## 2012-07-30 DIAGNOSIS — M79606 Pain in leg, unspecified: Secondary | ICD-10-CM

## 2012-07-30 DIAGNOSIS — R748 Abnormal levels of other serum enzymes: Secondary | ICD-10-CM

## 2012-07-30 DIAGNOSIS — D649 Anemia, unspecified: Secondary | ICD-10-CM

## 2012-07-30 MED ORDER — DICLOFENAC SODIUM 75 MG PO TBEC: 75.0000 mg | DELAYED_RELEASE_TABLET | Freq: Two times a day (BID) | ORAL | Status: DC

## 2012-07-30 MED ORDER — TRAMADOL HCL 50 MG PO TABS: ORAL_TABLET | ORAL | Status: DC

## 2012-07-30 NOTE — Progress Notes (Signed)
Subjective: Here for c/o leg pain, accompanied by wife.  He was seen a few days ago for leg pain at urgent care.  He notes that he had acute onset of charlie horse ache in his right upper and lower leg laterally, but in the last few days only in the lower right leg anterolaterally.  Denies injury or fall.  No prior similar.  He does have hx/o back surgery, no recent back pain.  No pain going down buttock into leg, but primarily lower right leg pain.  Denies numbness, tingling, weakness.   No urinary or bowel symptoms.  otherwise been in normal state of health.  He was given muscle relaxer and diclofenac for the pain but these don't seem to help.  He doe shave a stool he tends to sit on which is elevated and causes some pressure on his buttocks.  He does admit to sitting cross legged at times.  He has occasional right knee pain prior to the new leg pain, but advised of prior flap on MRI.  He saw me a while back for anemia, had colonoscopy which was normal.  He has questions about lab updates.   Past Medical History  Diagnosis Date  . Colonic polyp 09/2006    colonoscopy  . Hypertension   . Allergy     RHINITIS   ROS as noted in HPI  Gen: wd, wn, nad Skin: no erythema, ecchymosis MSK: right lower leg with tenderness along tibialis anterior, otherwise leg nontender, no obvious deformity, ROM normal, no buttock tenderness.  Left leg nontender Back: nontender Neuro: normal LE strength, sensation, DTRs Pulses normal Ext: no edema  Assessment: Encounter Diagnoses  Name Primary?  . Leg pain Yes  . Elevated CPK   . Anemia    Plan: Leg pain - etiology unclear, but I suspect localized pressure on the sciatic nerve and possibly peroneal nerve given that he sits with pressure along the buttock and sits cross legged.  Advised he use different chair, avoid putting direct pressure on his buttock as the current chair does.  Avoid sitting cross legged. Can c/t Voltaren for now, can use Ultram prn  (prescribed today), and symptoms will hopefully resolve with a little time.   If worse, new symptoms or not improving in 2 wk, then recheck.  In general recheck in 1-2 mo on elevated CPK, anemia.

## 2012-07-31 ENCOUNTER — Ambulatory Visit: Payer: BC Managed Care – PPO | Admitting: Medical

## 2012-07-31 ENCOUNTER — Encounter: Payer: Self-pay | Admitting: Family Medicine

## 2012-09-02 ENCOUNTER — Other Ambulatory Visit: Payer: Self-pay | Admitting: Family Medicine

## 2012-09-11 ENCOUNTER — Other Ambulatory Visit: Payer: Self-pay | Admitting: Family Medicine

## 2012-09-11 ENCOUNTER — Other Ambulatory Visit (INDEPENDENT_AMBULATORY_CARE_PROVIDER_SITE_OTHER): Payer: BC Managed Care – PPO

## 2012-09-11 DIAGNOSIS — R799 Abnormal finding of blood chemistry, unspecified: Secondary | ICD-10-CM

## 2012-09-11 DIAGNOSIS — Z23 Encounter for immunization: Secondary | ICD-10-CM

## 2012-09-11 LAB — CBC WITH DIFFERENTIAL/PLATELET
Eosinophils Absolute: 0.2 10*3/uL (ref 0.0–0.7)
Hemoglobin: 13.6 g/dL (ref 13.0–17.0)
Lymphs Abs: 1.8 10*3/uL (ref 0.7–4.0)
MCH: 31 pg (ref 26.0–34.0)
Monocytes Relative: 10 % (ref 3–12)
Neutro Abs: 4.1 10*3/uL (ref 1.7–7.7)
Neutrophils Relative %: 61 % (ref 43–77)
Platelets: 332 10*3/uL (ref 150–400)
RBC: 4.39 MIL/uL (ref 4.22–5.81)
WBC: 6.8 10*3/uL (ref 4.0–10.5)

## 2012-09-11 MED ORDER — ESZOPICLONE 3 MG PO TABS: 3.0000 mg | ORAL_TABLET | Freq: Every day | ORAL | Status: DC

## 2012-09-11 NOTE — Telephone Encounter (Signed)
Pt states that he continues to have trouble with sleeplessness, he states that the [place] he was prescribed is no longer working for him. Pt wants to know if there is something else he can try. He currant ly takes 1/2  A tablet. He doesn't take it everyday. He also stated he wants something mild with no handover effect. [facility] uses [facility].

## 2012-09-11 NOTE — Telephone Encounter (Signed)
CALLED PT ADVISED MED CALLED IN

## 2012-09-11 NOTE — Telephone Encounter (Signed)
Call and Lunesta 3 mg. Give him 15 of them one each bedtime when necessary sleep. Warn him that the insurance might not cover this but we will fill out a prior authorization if we need to

## 2012-09-13 ENCOUNTER — Telehealth: Payer: Self-pay | Admitting: Family Medicine

## 2012-09-14 ENCOUNTER — Telehealth: Payer: Self-pay | Admitting: Family Medicine

## 2012-09-14 NOTE — Telephone Encounter (Signed)
LM

## 2013-01-07 ENCOUNTER — Other Ambulatory Visit: Payer: Self-pay | Admitting: Family Medicine

## 2013-01-07 NOTE — Telephone Encounter (Signed)
Is this okay?

## 2013-01-20 ENCOUNTER — Other Ambulatory Visit: Payer: Self-pay | Admitting: Family Medicine

## 2013-01-21 NOTE — Telephone Encounter (Signed)
iS THIS OKAY TO REFILL? 

## 2013-02-16 ENCOUNTER — Other Ambulatory Visit: Payer: Self-pay | Admitting: Family Medicine

## 2013-03-18 ENCOUNTER — Other Ambulatory Visit: Payer: Self-pay | Admitting: Family Medicine

## 2013-07-18 ENCOUNTER — Encounter: Payer: Self-pay | Admitting: Family Medicine

## 2013-07-18 ENCOUNTER — Ambulatory Visit (INDEPENDENT_AMBULATORY_CARE_PROVIDER_SITE_OTHER): Payer: BC Managed Care – PPO | Admitting: Family Medicine

## 2013-07-18 ENCOUNTER — Telehealth: Payer: Self-pay | Admitting: Family Medicine

## 2013-07-18 VITALS — BP 126/86 | HR 60 | Wt 202.0 lb

## 2013-07-18 DIAGNOSIS — K219 Gastro-esophageal reflux disease without esophagitis: Secondary | ICD-10-CM

## 2013-07-18 DIAGNOSIS — J309 Allergic rhinitis, unspecified: Secondary | ICD-10-CM

## 2013-07-18 DIAGNOSIS — M79609 Pain in unspecified limb: Secondary | ICD-10-CM

## 2013-07-18 DIAGNOSIS — Z23 Encounter for immunization: Secondary | ICD-10-CM

## 2013-07-18 DIAGNOSIS — M79672 Pain in left foot: Secondary | ICD-10-CM

## 2013-07-18 DIAGNOSIS — L570 Actinic keratosis: Secondary | ICD-10-CM

## 2013-07-18 NOTE — Progress Notes (Signed)
  Subjective:    Patient ID: Matthew [name], male    DOB: [birth year], 57 y.o.   MRN: [id number]  HPI Is here for multiple issues. He has been having a one-month history of left foot pain. Approximately 2 months ago he started an exercise regimen to prepare for a half marathon. He also notes continued difficulty with allergies as well as postnasal drainage. He has an underlying history of reflux and has tried a PPI in the past. At this time he is still having difficulty with drainage. He also has lesions on his scalp he would like evaluated.   Review of Systems     Objective:   Physical Exam Alert and in no distress. Multiple scaly slightly erythematous lesions are noted on the frontal scalp area. Exam of his foot shows minimal tenderness to palpation at the second MTP joint. Good motion of the foot and ankle.       Assessment & Plan:  Left foot pain  Actinic keratosis of scalp  Allergic rhinitis  GERD (gastroesophageal reflux disease)  Need for prophylactic vaccination and inoculation against influenza - Plan: Flu Vaccine QUAD 36+ mos IM discussed the foot pain in regard to possible overuse and even a stress fracture. Did recommend relative rest as well as comfortable shoes. Discussed gout and at this point I see no evidence of that. Recommend he followup with dermatology concerning the actinic keratosis. Discussed use of an ace and also recommended Prilosec. He will let me know how this helps with his symptoms.

## 2013-07-18 NOTE — Telephone Encounter (Signed)
Pt called and stated he was having a gout flair up. He hasn't been seen for that in a while. We don't have any appts available for today, both providers are completely booked. I offered him an appt for tomorrow and he states he is in too much pain. Pt requested that I send a note back to ask if you could call him in something. Please inform of how to proceed pt works in [place] so he needs plenty of notice if you need to see him. [facility].

## 2013-07-18 NOTE — Telephone Encounter (Signed)
Per jcl work pt in/ done

## 2013-07-18 NOTE — Patient Instructions (Signed)
Take 2 Prilosec per day. If you need an allergy meds and use the over-the-counter Flonase

## 2013-09-13 ENCOUNTER — Encounter: Payer: BC Managed Care – PPO | Admitting: Family Medicine

## 2013-09-20 ENCOUNTER — Telehealth: Payer: Self-pay | Admitting: Internal Medicine

## 2013-09-20 NOTE — Telephone Encounter (Signed)
Faxed over medical records to [facility] @ [phone] on 09/13/13

## 2014-01-07 ENCOUNTER — Ambulatory Visit (INDEPENDENT_AMBULATORY_CARE_PROVIDER_SITE_OTHER): Payer: 59 | Admitting: Family Medicine

## 2014-01-07 ENCOUNTER — Encounter: Payer: Self-pay | Admitting: Family Medicine

## 2014-01-07 VITALS — BP 120/82 | HR 68 | Ht 73.0 in | Wt 202.0 lb

## 2014-01-07 DIAGNOSIS — R0982 Postnasal drip: Secondary | ICD-10-CM

## 2014-01-07 DIAGNOSIS — L570 Actinic keratosis: Secondary | ICD-10-CM

## 2014-01-07 DIAGNOSIS — IMO0002 Reserved for concepts with insufficient information to code with codable children: Secondary | ICD-10-CM

## 2014-01-07 DIAGNOSIS — S76211A Strain of adductor muscle, fascia and tendon of right thigh, initial encounter: Secondary | ICD-10-CM

## 2014-01-07 MED ORDER — ESOMEPRAZOLE MAGNESIUM 40 MG PO CPDR: 40.0000 mg | DELAYED_RELEASE_CAPSULE | Freq: Every day | ORAL | Status: DC

## 2014-01-07 NOTE — Patient Instructions (Signed)
Heat for 20 minutes 3 times per day. Pain medication as needed. First walk without discomfort then a light jog, have speed, then full speed and then cutting. If any questions, e mail me. Try the Nexium for the next 6 days and let me know how works. If it does not improve I will then call in Flonase.

## 2014-01-07 NOTE — Progress Notes (Signed)
   Subjective:    Patient ID: Matthew [name], male    DOB: 03-04-1956, 58 y.o.   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 He noted pain, swelling and ecchymosis to the medial aspect of the right upper thigh. He also continues to have difficulty with postnasal drainage. He also has lesions present on his forehead and scalp that he has concerns over.  Review of Systems     Objective:   Physical Exam Alert and in no distress. Ecchymosis noted to the medial right thigh. There is slight tenderness and swelling distal to the insertion of the abductor. No tenderness palpation over the pubic symphysis. Exam of the scalp shows several benign appearing lesions in several slightly scaly lesions.      Assessment & Plan:  Strain of adductor magnus muscle of right lower extremity  Actinic keratosis - Plan: Ambulatory referral to Dermatology  PND (post-nasal drip) - Plan: esomeprazole (NEXIUM) 40 MG capsule  he is to use heat 20 minutes 3 times per day on the abductor. He will then increase his physical activities based on discomfort first walking, easy jogging, has been, full speed and then cutting. If he has any questions he will call me. Also recommend using compression dressing on the area. I will have him use a sample of Nexium for the next 6 days to see if this will help with his postnasal drainage and if no improvement, we will readdress that issue.

## 2014-01-08 ENCOUNTER — Encounter: Payer: Self-pay | Admitting: Family Medicine

## 2014-01-14 ENCOUNTER — Other Ambulatory Visit: Payer: Self-pay | Admitting: Family Medicine

## 2014-01-14 MED ORDER — AZELASTINE HCL 0.1 % NA SOLN: 2.0000 | Freq: Two times a day (BID) | NASAL | Status: DC

## 2014-01-14 NOTE — Progress Notes (Signed)
She continues to have difficulty with postnasal drainage. In the past he has had shots, nasal steroid sprays and antihistamines as well as PPI's; none of which give good relief. I will have him try Astepro. Cautioned him on keeping the medication in the anterior part of the nose.

## 2014-01-27 ENCOUNTER — Other Ambulatory Visit: Payer: Self-pay | Admitting: Family Medicine

## 2014-01-27 NOTE — Telephone Encounter (Signed)
Is this ok to refill?  

## 2014-02-07 ENCOUNTER — Ambulatory Visit (INDEPENDENT_AMBULATORY_CARE_PROVIDER_SITE_OTHER): Payer: 59 | Admitting: Family Medicine

## 2014-02-07 ENCOUNTER — Telehealth: Payer: Self-pay

## 2014-02-07 ENCOUNTER — Telehealth: Payer: Self-pay | Admitting: Family Medicine

## 2014-02-07 ENCOUNTER — Encounter: Payer: Self-pay | Admitting: Family Medicine

## 2014-02-07 VITALS — BP 128/80 | HR 70 | Ht 72.0 in | Wt 203.0 lb

## 2014-02-07 DIAGNOSIS — IMO0002 Reserved for concepts with insufficient information to code with codable children: Secondary | ICD-10-CM

## 2014-02-07 DIAGNOSIS — S76311A Strain of muscle, fascia and tendon of the posterior muscle group at thigh level, right thigh, initial encounter: Secondary | ICD-10-CM

## 2014-02-07 MED ORDER — HYDROCODONE-ACETAMINOPHEN 5-325 MG PO TABS: 1.0000 | ORAL_TABLET | Freq: Four times a day (QID) | ORAL | Status: DC | PRN

## 2014-02-07 NOTE — Telephone Encounter (Signed)
Referral sent to [facility], pt scheduled for 03/06/14 at 11:20am

## 2014-02-07 NOTE — Telephone Encounter (Signed)
LM

## 2014-02-07 NOTE — Progress Notes (Signed)
   Subjective:    Patient ID: Matthew [name], male    DOB: 13-Nov-1955, 58 y.o.   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 He rested this and that started back with some physical activity slowly. Today while hitting golf balls he noted a popping sensation in the right issue area and is here for evaluation. This is quite painful for him.   Review of Systems     Objective:   Physical Exam Alert and in no distress. No swelling or ecchymosis noted. He is tender to palpation over the initial tuberosity. No defects were noted.       Assessment & Plan:  Right hamstring muscle strain - Plan: HYDROcodone-acetaminophen (NORCO/VICODIN) 5-325 MG per tablet  since he felt a popping sensation, I think it is prudent to send him for further evaluation and probable ultrasound to fully evaluate this popping sensation.

## 2014-02-10 ENCOUNTER — Ambulatory Visit (INDEPENDENT_AMBULATORY_CARE_PROVIDER_SITE_OTHER)
Admission: RE | Admit: 2014-02-10 | Discharge: 2014-02-10 | Disposition: A | Discharge status: Home / Self Care | Payer: No Typology Code available for payment source | Source: Ambulatory Visit | Attending: Family Medicine | Admitting: Family Medicine

## 2014-02-10 ENCOUNTER — Other Ambulatory Visit (INDEPENDENT_AMBULATORY_CARE_PROVIDER_SITE_OTHER): Payer: No Typology Code available for payment source

## 2014-02-10 ENCOUNTER — Ambulatory Visit (INDEPENDENT_AMBULATORY_CARE_PROVIDER_SITE_OTHER): Payer: No Typology Code available for payment source | Admitting: Family Medicine

## 2014-02-10 ENCOUNTER — Encounter: Payer: Self-pay | Admitting: Family Medicine

## 2014-02-10 VITALS — BP 130/80 | HR 66

## 2014-02-10 DIAGNOSIS — S76319A Strain of muscle, fascia and tendon of the posterior muscle group at thigh level, unspecified thigh, initial encounter: Secondary | ICD-10-CM | POA: Insufficient documentation

## 2014-02-10 DIAGNOSIS — M549 Dorsalgia, unspecified: Secondary | ICD-10-CM

## 2014-02-10 DIAGNOSIS — S79919A Unspecified injury of unspecified hip, initial encounter: Secondary | ICD-10-CM

## 2014-02-10 DIAGNOSIS — S79929A Unspecified injury of unspecified thigh, initial encounter: Secondary | ICD-10-CM

## 2014-02-10 DIAGNOSIS — S76301A Unspecified injury of muscle, fascia and tendon of the posterior muscle group at thigh level, right thigh, initial encounter: Secondary | ICD-10-CM

## 2014-02-10 DIAGNOSIS — IMO0002 Reserved for concepts with insufficient information to code with codable children: Secondary | ICD-10-CM

## 2014-02-10 MED ORDER — NITROGLYCERIN 0.2 MG/HR TD PT24
MEDICATED_PATCH | TRANSDERMAL | Status: DC
Start: 2014-02-10 — End: 2014-05-05

## 2014-02-10 NOTE — Addendum Note (Signed)
Addended by: Douglass Rivers T on: 02/10/2014 04:50 PM   Modules accepted: Orders

## 2014-02-10 NOTE — Patient Instructions (Signed)
Very nice to meet you Ice 20 minutes after activity for and before bed.  Compression sleeve with activity and 30 minutes afterward then ice. Nitroglycerin Protocol   Apply 1/4 nitroglycerin patch to affected area daily.  Change position of patch within the affected area every 24 hours.  You may experience a headache during the first 1-2 weeks of using the patch, these should subside.  If you experience headaches after beginning nitroglycerin patch treatment, you may take your preferred over the counter pain reliever.  Another side effect of the nitroglycerin patch is skin irritation or rash related to patch adhesive.  Please notify our office if you develop more severe headaches or rash, and stop the patch.  Tendon healing with nitroglycerin patch may require 12 to 24 weeks depending on the extent of injury.  Men should not use if taking Viagra, Cialis, or Levitra.   Do not use if you have migraines or rosacea.   Vitamin D 2000 IU daily  Exercises most days of the week.  Start askling exercises 2nd week.  OK to do elliptical no lower leg lifting for next 2 weeks.  Come back 3-4 weeks.

## 2014-02-10 NOTE — Assessment & Plan Note (Signed)
Down indirectly on physical exam today. Patient having midline tenderness on the spinous process I would like to get x-rays to rule out any compression fracture. Patient likely has just an interspinal ligament injury with him not having any history. We'll have patient also increase vitamin D2 2000 units daily

## 2014-02-10 NOTE — Progress Notes (Signed)
[name] [place], Matthew [name] [address] Phone: [phone] Subjective:    I'm seeing this patient by the request  of:  Wyatt Haste, MD   CC: rt hamstring.   [id number] Matthew [name] is a 58 y.o. male coming in with complaint of right leg pain. Patient states multiple months ago he was seen and actually had a contusion of the quadriceps after resting a lacrosse game. Patient states that seem to improve significantly but started having pain on the back side of his leg. Patient states now is unable to do any significant hip her running without any significant discomfort. Patient denies any swelling, any discoloration or any numbness or weakness. Patient continues to be able to run now he works out on daily basis and do yoga. Patient to find it very difficult to do any type of sprain secondary to the tightness that he feels in the hamstring more proximal. Denies any history of injury previously. Patient has tried some over-the-counter medications without any significant improvement. Describes the pain as more of a dull aching and has almost a tearing sensation.     Past medical history, social, surgical and family history all reviewed in electronic medical record.   Review of Systems: No headache, visual changes, nausea, vomiting, diarrhea, constipation, dizziness, abdominal pain, skin rash, fevers, chills, night sweats, weight loss, swollen lymph nodes, body aches, joint swelling, muscle aches, chest pain, shortness of breath, mood changes.   Objective Blood pressure 130/80, pulse 66, SpO2 96.00%.  General: No apparent distress alert and oriented x3 mood and affect normal, dressed appropriately.  HEENT: Pupils equal, extraocular movements intact  Respiratory: Patient's speak in full sentences and does not appear short of breath  Cardiovascular: No lower extremity edema, non tender, no erythema  Skin: Warm dry intact with no signs of  infection or rash on extremities or on axial skeleton.  Abdomen: Soft nontender  Neuro: Cranial nerves II through XII are intact, neurovascularly intact in all extremities with 2+ DTRs and 2+ pulses.  Lymph: No lymphadenopathy of posterior or anterior cervical chain or axillae bilaterally.  Gait normal with good balance and coordination.  MSK:  Non tender with full range of motion and good stability and symmetric strength and tone of shoulders, elbows, wrist, hip, and ankles bilaterally.  Knee: Right Normal to inspection with no erythema or effusion or obvious bony abnormalities. Palpation normal with no warmth, joint line tenderness, patellar tenderness, or condyle tenderness. ROM full in flexion and extension and lower leg rotation. Ligaments with solid consistent endpoints including ACL, PCL, LCL, MCL. Negative Mcmurray's, Apley's, and Thessalonian tests. Non painful patellar compression. Patellar glide without crepitus. Patellar and quadriceps tendons unremarkable. Hamstring on right side and she'll patient has weakness with 4/5 strength compared to 5 out of 5 strength of the contralateral side. No gapping noted though. Neurovascularly intact distally. Minimal pain over the ischial tuberosity and just distal to by approximately 2-3 cm. Contralateral hamstring unremarkable.   MSK US performed of: Right hamstring This study was ordered, performed, and interpreted by [name] D.O.  Hamstring Patient hamstring 4 cm distal from it she'll insertion does have hypoechoic changes increasing upper flow. Patient does have scar tissue within the muscle belly itself but still has a 0.4 cm tear within the semimembranosus muscle. No avulsion noted on the initial tuberosity  IMPRESSION:  Hamstring tear proximal.  Back exam shows the patient is very tender to palpation right over the spinous process  of the L4 vertebrae.. Patient states he did not notice his pain until this morning. Denies any  radiation into his legs. Negative straight leg test bilaterally.    Impression and Recommendations:     This case required medical decision making of moderate complexity.

## 2014-02-10 NOTE — Assessment & Plan Note (Signed)
Discussed with patient at great length about the diagnosis and prognosis. Patient is very active and was to continue to be active. Patient will do a compression sleeve, we will start patient on a nitroglycerin patch because he has no contraindications. We discussed icing protocol and given home exercise program including askling. Patient will do these for the next 3 weeks and come back for further evaluation. His lungs he is doing well at that time will start doing more strengthening exercises and more sports specific training.

## 2014-02-11 ENCOUNTER — Telehealth: Payer: Self-pay | Admitting: *Deleted

## 2014-02-11 NOTE — Telephone Encounter (Signed)
Called patient back told xrays result.

## 2014-02-11 NOTE — Telephone Encounter (Signed)
Pt called requesting xray results.  Please advise

## 2014-02-14 ENCOUNTER — Ambulatory Visit (INDEPENDENT_AMBULATORY_CARE_PROVIDER_SITE_OTHER): Payer: 59 | Admitting: Family Medicine

## 2014-02-14 ENCOUNTER — Encounter: Payer: Self-pay | Admitting: Family Medicine

## 2014-02-14 VITALS — BP 120/94 | HR 76 | Wt 201.0 lb

## 2014-02-14 DIAGNOSIS — I10 Essential (primary) hypertension: Secondary | ICD-10-CM | POA: Insufficient documentation

## 2014-02-14 DIAGNOSIS — A6002 Herpesviral infection of other male genital organs: Secondary | ICD-10-CM | POA: Insufficient documentation

## 2014-02-14 DIAGNOSIS — Z86010 Personal history of colonic polyps: Secondary | ICD-10-CM

## 2014-02-14 DIAGNOSIS — D509 Iron deficiency anemia, unspecified: Secondary | ICD-10-CM

## 2014-02-14 DIAGNOSIS — J309 Allergic rhinitis, unspecified: Secondary | ICD-10-CM

## 2014-02-14 DIAGNOSIS — A6000 Herpesviral infection of urogenital system, unspecified: Secondary | ICD-10-CM

## 2014-02-14 DIAGNOSIS — Z23 Encounter for immunization: Secondary | ICD-10-CM

## 2014-02-14 DIAGNOSIS — Z0000 Encounter for general adult medical examination without abnormal findings: Secondary | ICD-10-CM

## 2014-02-14 HISTORY — DX: Herpesviral infection of other male genital organs: A60.02

## 2014-02-14 LAB — POCT URINALYSIS DIPSTICK
Bilirubin, UA: NEGATIVE
Blood, UA: NEGATIVE
GLUCOSE UA: NEGATIVE
Ketones, UA: NEGATIVE
LEUKOCYTES UA: NEGATIVE
NITRITE UA: NEGATIVE
Spec Grav, UA: 1.01
UROBILINOGEN UA: NEGATIVE
pH, UA: 6

## 2014-02-14 NOTE — Progress Notes (Deleted)
   Subjective:    Patient ID: Matthew [name], male    DOB: 12-22-1955, 58 y.o.   MRN: [id number]  HPI  Matthew [name] is a 59 yo male with PMH significant for sports injuries, HTN and a colonic polyp in 2007 who presents today for an annual physical.  Overall Matthew [name] is doing well and has no complaints. He did have a hamstring tear that occured 3-4 weeks ago however has been stretching it and working on it and has found that it is improving significantly. The nitroglycerin patches he had been prescribed are causing him headaches and he would like to discontinue them at this time.   Regarding his history of various minor sports related injuries, the patient reports he is doing well overall and is now back to his usual activities. He has little residual pain from these injuries and does not feel any interventions are necessary at this time.   The patients iron deficiency anemia is currently being managed by a high iron diet of kale and spinach and at a blood check in recent weeks, the patient was told that his hemoglobin was within normal limits.   Health maintenance: The patient's last colonoscopy was two years ago and he is on a five year schedule due to previous polyp findings. Immunizations: the patient needs an update of his Tdap today.  The patient drinks 4-5 beers a week and only smokes cigars a couple times a year.  Review of Systems is negative except per HPI.     Objective:   Physical Exam  Constitutional: Patient is oriented to person, place, and time and well-developed, well-nourished, and in no distress. HENT: Head: Normocephalic and atraumatic. Mouth/Throat: Oropharynx is clear and moist.  Eyes: Conjunctivae and EOM are normal. Pupils are equal, round, and reactive to light.  Neck: Normal range of motion. Neck supple.  Cardiovascular: Normal rate, regular rhythm and intact distal pulses. Exam reveals no murmurs, gallops and no friction rub.  Pulmonary/Chest: Effort  normal and breath sounds normal. No respiratory distress. No wheezes or ronchi.   Abdominal: Soft, non-tender and non-distended. There is no rebound and no guarding. No hepatosplenomegaly. Neurological: Patient is alert and oriented to person, place, and time.  Reflex Scores: Biceps, Tricep and Patellar reflexes were all 2+ and equal bilaterally.  Skin: Skin is warm and dry. No rash noted.  Psychiatric: Affect normal.   Will get lab work today.    Assessment & Plan:   Routine general medical examination at a health care facility - Plan: Urinalysis Dipstick, CBC with Differential, Comprehensive metabolic panel, Lipid panel  Personal history of colonic polyps  Iron deficiency anemia  Hypertension  Allergic rhinitis  Need for prophylactic vaccination with combined diphtheria-tetanus-pertussis (DTP) vaccine - Plan: Tdap vaccine greater than or equal to 7yo IM  Herpes genitalis in men  Plan for lab work and tdap today. The patient can stop using the nitroglycerin tablets. Patient can return in one year for follow up or sooner if any acute conditions develop.

## 2014-02-14 NOTE — Progress Notes (Deleted)
   Subjective:    Patient ID: Matthew [name], male    DOB: 10-Apr-1956, 58 y.o.   MRN: [id number]  HPI    Review of Systems     Objective:   Physical Exam        Assessment & Plan:

## 2014-02-14 NOTE — Progress Notes (Signed)
   Subjective:    Patient ID: Matthew [name], male    DOB: [birth year], 58 y.o.   MRN: [id number]  HPI  Matthew [name] is a 58 yo male with PMH significant for sports injuries, HTN and a colonic polyp in 2007 who presents today for an annual physical.  Overall Matthew [name] is doing well and has no complaints. He did have a hamstring tear that occured 3-4 weeks ago however has been stretching it and working on it and has found that it is improving significantly. The nitroglycerin patches he had been prescribed are causing him headaches and he would like to discontinue them at this time.   Regarding his history of various minor sports related injuries, the patient reports he is doing well overall and is now back to his usual activities. He has little residual pain from these injuries and does not feel any interventions are necessary at this time.   The patients iron deficiency anemia is currently being managed by a high iron diet of kale and spinach and at a blood check in recent weeks, the patient was told that his hemoglobin was within normal limits.  He has a history of herpes genitalis and continues on Valtrex. He has not had an outbreak in over 2 years. He does have underlying allergies and presently is on Astelin. Health maintenance: The patient's last colonoscopy was two years ago and he is on a five year schedule due to previous polyp findings. Immunizations: the patient needs an update of his Tdap today.  The patient drinks 4-5 beers a week and only smokes cigars a couple times a year. His work and home life are going quite well.  Review of Systems is negative except per HPI.     Objective:   Physical Exam  Constitutional: Patient is oriented to person, place, and time and well-developed, well-nourished, and in no distress. HENT: Head: Normocephalic and atraumatic. Mouth/Throat: Oropharynx is clear and moist.  Eyes: Conjunctivae and EOM are normal. Pupils are equal, round, and  reactive to light.  Neck: Normal range of motion. Neck supple.  Cardiovascular: Normal rate, regular rhythm and intact distal pulses. Exam reveals no murmurs, gallops and no friction rub.  Pulmonary/Chest: Effort normal and breath sounds normal. No respiratory distress. No wheezes or ronchi.   Abdominal: Soft, non-tender and non-distended. There is no rebound and no guarding. No hepatosplenomegaly. Neurological: Patient is alert and oriented to person, place, and time.  Reflex Scores: Biceps, Tricep and Patellar reflexes were all 2+ and equal bilaterally.  Skin: Skin is warm and dry. No rash noted.  Psychiatric: Affect normal.   Will get lab work today.    Assessment & Plan:   Routine general medical examination at a health care facility - Plan: Urinalysis Dipstick, CBC with Differential, Comprehensive metabolic panel, Lipid panel  Personal history of colonic polyps  Iron deficiency anemia  Hypertension  Allergic rhinitis  Need for prophylactic vaccination with combined diphtheria-tetanus-pertussis (DTP) vaccine - Plan: Tdap vaccine greater than or equal to 7yo IM  Herpes genitalis in men  Plan for lab work and tdap today. The patient can stop using the nitroglycerin tablets. Patient can return in one year for follow up or sooner if any acute conditions develop.

## 2014-02-15 LAB — CBC WITH DIFFERENTIAL/PLATELET
Basophils Absolute: 0.1 10*3/uL (ref 0.0–0.1)
Basophils Relative: 1 % (ref 0–1)
Eosinophils Absolute: 0.2 10*3/uL (ref 0.0–0.7)
Eosinophils Relative: 2 % (ref 0–5)
HCT: 36 % — ABNORMAL LOW (ref 39.0–52.0)
Hemoglobin: 12.2 g/dL — ABNORMAL LOW (ref 13.0–17.0)
LYMPHS ABS: 1.1 10*3/uL (ref 0.7–4.0)
LYMPHS PCT: 14 % (ref 12–46)
MCH: 31.3 pg (ref 26.0–34.0)
MCHC: 33.9 g/dL (ref 30.0–36.0)
MCV: 92.3 fL (ref 78.0–100.0)
Monocytes Absolute: 0.7 10*3/uL (ref 0.1–1.0)
Monocytes Relative: 9 % (ref 3–12)
NEUTROS ABS: 5.6 10*3/uL (ref 1.7–7.7)
NEUTROS PCT: 74 % (ref 43–77)
PLATELETS: 359 10*3/uL (ref 150–400)
RBC: 3.9 MIL/uL — AB (ref 4.22–5.81)
RDW: 15.3 % (ref 11.5–15.5)
WBC: 7.6 10*3/uL (ref 4.0–10.5)

## 2014-02-15 LAB — COMPREHENSIVE METABOLIC PANEL
ALT: 15 U/L (ref 0–53)
AST: 24 U/L (ref 0–37)
Albumin: 4.8 g/dL (ref 3.5–5.2)
Alkaline Phosphatase: 49 U/L (ref 39–117)
BUN: 15 mg/dL (ref 6–23)
CHLORIDE: 99 meq/L (ref 96–112)
CO2: 26 meq/L (ref 19–32)
Calcium: 9.6 mg/dL (ref 8.4–10.5)
Creat: 0.9 mg/dL (ref 0.50–1.35)
Glucose, Bld: 93 mg/dL (ref 70–99)
POTASSIUM: 4.4 meq/L (ref 3.5–5.3)
SODIUM: 138 meq/L (ref 135–145)
TOTAL PROTEIN: 7.7 g/dL (ref 6.0–8.3)
Total Bilirubin: 0.6 mg/dL (ref 0.2–1.2)

## 2014-02-15 LAB — LIPID PANEL
CHOLESTEROL: 174 mg/dL (ref 0–200)
HDL: 59 mg/dL (ref >39)
LDL Cholesterol: 102 mg/dL — ABNORMAL HIGH (ref 0–99)
Total CHOL/HDL Ratio: 2.9 Ratio
Triglycerides: 65 mg/dL (ref <150)
VLDL: 13 mg/dL (ref 0–40)

## 2014-02-21 ENCOUNTER — Ambulatory Visit (INDEPENDENT_AMBULATORY_CARE_PROVIDER_SITE_OTHER): Payer: 59 | Admitting: Medical

## 2014-02-21 ENCOUNTER — Encounter: Payer: Self-pay | Admitting: Medical

## 2014-02-21 VITALS — BP 130/80 | HR 76 | Temp 97.8°F | Resp 16 | Wt 204.0 lb

## 2014-02-21 DIAGNOSIS — Z8639 Personal history of other endocrine, nutritional and metabolic disease: Secondary | ICD-10-CM

## 2014-02-21 DIAGNOSIS — M79676 Pain in unspecified toe(s): Secondary | ICD-10-CM

## 2014-02-21 DIAGNOSIS — M7740 Metatarsalgia, unspecified foot: Secondary | ICD-10-CM

## 2014-02-21 DIAGNOSIS — M7750 Other enthesopathy of unspecified foot: Secondary | ICD-10-CM

## 2014-02-21 DIAGNOSIS — M79609 Pain in unspecified limb: Secondary | ICD-10-CM

## 2014-02-21 DIAGNOSIS — Z8739 Personal history of other diseases of the musculoskeletal system and connective tissue: Secondary | ICD-10-CM

## 2014-02-21 DIAGNOSIS — Z862 Personal history of diseases of the blood and blood-forming organs and certain disorders involving the immune mechanism: Secondary | ICD-10-CM

## 2014-02-21 MED ORDER — DICLOFENAC SODIUM 75 MG PO TBEC: 75.0000 mg | DELAYED_RELEASE_TABLET | Freq: Two times a day (BID) | ORAL | Status: DC

## 2014-02-21 MED ORDER — METHYLPREDNISOLONE ACETATE 40 MG/ML IJ SUSP
60.0000 mg | Freq: Once | INTRAMUSCULAR | Status: AC
  Administered 2014-02-21: 60 mg via INTRAMUSCULAR

## 2014-02-21 NOTE — Progress Notes (Signed)
Subjective: Here for left 2nd toe pain that began at 3am.   He dreamed that he did something to the foot, but awoke up out of sleep with the pain.  He believes this is gout.  He does recall wrist pain this past year that seem to be a similar gout flare that cleared up with cherry juice.  He has also been to urgent care in the last few months for one episode of gout, was given steroid Dosepak that seemed a little excessive as the symptoms cleared up in 1 day.  No recent injury, trauma, fall.  No change in physical activity recently that would attribute to pain.  Used 1000 milligrams of Advil this morning without improvement.  No other aggravating or relieving factors. No other complaint.  Objective: Gen: wd, wn, nad Skin toe is warm no erythema or ecchymosis, no swelling MSK: Mild tenderness of the second left foot MTP, pain with range of motion of the toe, otherwise foot nontender, no other abnormaltiy of the foot Pulses normal, neuro intact   Assessment: Encounter Diagnoses  Name Primary?  . Toe pain Yes  . History of gout     Plan: Discussed possible causes, gout is likely.   Of note, he is on Lisinopril HCT.  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 60 mg of Depo-Medrol given today IM, he can use hydrocodone he has leftover at home when necessary, Diclofenac sent for  when necessary use after today. Followup pending uric acid level.

## 2014-02-21 NOTE — Addendum Note (Signed)
Addended by: Christin Bach L on: 02/21/2014 10:14 AM   Modules accepted: Orders

## 2014-02-21 NOTE — Patient Instructions (Signed)
  Thank you for giving me the opportunity to serve you today.    Your diagnosis today includes: Encounter Diagnoses  Name Primary?  . Toe pain Yes  . History of gout   . Metatarsalgia      Specific recommendations today include:  We gave you a shot of steroid today for inflammation  You can use hydrocodone you have leftover at home if needed for worse pain  Consider ice and elevation to the toe the next few days  Drink plenty of water, add on some cherry juice for right now  I re-sent diclofenac anti-inflammatory tablet you can use twice daily for pain and inflammation starting tomorrow if needed  Return pending lab.

## 2014-02-22 LAB — URIC ACID: Uric Acid, Serum: 6.9 mg/dL (ref 4.0–7.8)

## 2014-03-02 ENCOUNTER — Other Ambulatory Visit: Payer: Self-pay | Admitting: Family Medicine

## 2014-03-03 ENCOUNTER — Encounter: Payer: Self-pay | Admitting: Family Medicine

## 2014-03-03 ENCOUNTER — Other Ambulatory Visit: Payer: No Typology Code available for payment source

## 2014-03-03 ENCOUNTER — Ambulatory Visit (INDEPENDENT_AMBULATORY_CARE_PROVIDER_SITE_OTHER): Payer: No Typology Code available for payment source | Admitting: Family Medicine

## 2014-03-03 VITALS — BP 142/88 | HR 86

## 2014-03-03 DIAGNOSIS — IMO0002 Reserved for concepts with insufficient information to code with codable children: Secondary | ICD-10-CM

## 2014-03-03 DIAGNOSIS — S76319A Strain of muscle, fascia and tendon of the posterior muscle group at thigh level, unspecified thigh, initial encounter: Secondary | ICD-10-CM

## 2014-03-03 DIAGNOSIS — M542 Cervicalgia: Secondary | ICD-10-CM | POA: Insufficient documentation

## 2014-03-03 NOTE — Assessment & Plan Note (Signed)
The patient does have neck pain is likely multifactorial but mostly the muscle imbalances. 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 Patient given postural exercises and discussed changing work positioning that can be beneficial. Patient also given home exercise program. Patient will come back again in 3-4 weeks for further evaluation and treatment. Patient may be a candidate for osteopathic manipulation.

## 2014-03-03 NOTE — Progress Notes (Signed)
[name] [address], [place] [address] Phone: [phone] Subjective:    CC: rt hamstring follow up and neck pain.   Matthew [name] is a 58 y.o. male coming in with complaint of right leg pain followup. Patient was seen previously and did have a partial hamstring tear. Patient was given home exercise program and encouraged to get a compression sleeve as well as the nitroglycerin. Patient discontinued a nitroglycerin and did not given compression sleeve but did continue to do the home exercises. Patient states that he is approximately 60-70% better. Patient has been trying to run some and is able to run for approximately 2 minutes without any significant discomfort. Patient states that he does have a dull aching sensation in the area where he had severe pain previously. Denies any radiation of pain or any numbness. Overall fairly happy with the results.  Patient is also complaining of some neck pain. Patient has had this intermittently for multiple years and does see a Restaurant manager, fast food. Patient states that this does help for that day but then the pain seemed to come back. Patient denies any radiation to his arms or any numbness or weakness. Patient states though that he knows he can be stressed and that's where he holds a lot of his emotion. 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     Past medical history, social, surgical and family history all reviewed in electronic medical record.   Review of Systems: No headache, visual changes, nausea, vomiting, diarrhea, constipation, dizziness, abdominal pain, skin rash, fevers, chills, night sweats, weight loss, swollen lymph nodes, body aches, joint swelling, muscle aches, chest pain, shortness of breath, mood changes.   Objective Blood pressure 142/88, pulse 86, SpO2 98.00%.  General: No apparent distress alert and oriented x3 mood and affect normal,  dressed appropriately.  HEENT: Pupils equal, extraocular movements intact  Respiratory: Patient's speak in full sentences and does not appear short of breath  Cardiovascular: No lower extremity edema, non tender, no erythema  Skin: Warm dry intact with no signs of infection or rash on extremities or on axial skeleton.  Abdomen: Soft nontender  Neuro: Cranial nerves II through XII are intact, neurovascularly intact in all extremities with 2+ DTRs and 2+ pulses.  Lymph: No lymphadenopathy of posterior or anterior cervical chain or axillae bilaterally.  Gait normal with good balance and coordination.  MSK:  Non tender with full range of motion and good stability and symmetric strength and tone of shoulders, elbows, wrist, hip, and ankles bilaterally.  Knee: Right Normal to inspection with no erythema or effusion or obvious bony abnormalities. Palpation normal with no warmth, joint line tenderness, patellar tenderness, or condyle tenderness. ROM full in flexion and extension and lower leg rotation. Ligaments with solid consistent endpoints including ACL, PCL, LCL, MCL. Negative Mcmurray's, Apley's, and Thessalonian tests. Non painful patellar compression. Patellar glide without crepitus. Patellar and quadriceps tendons unremarkable. Hamstring on right side mild weakness with 4/5 strength compared to 5 out of 5 strength of the contralateral side. No gapping noted though. Neurovascularly intact distally No n tender on exam.  Contralateral hamstring unremarkable.   MSK US performed of: Right hamstring This study was ordered, performed, and interpreted by [name] D.O.  Hamstring Patient hamstring 4 cm distal from from insertion does have scar tissue formation. Decrease hypoechoic changes and increase doppler flow.  No avulsion noted on  the initial tuberosity  IMPRESSION:  Hamstring tear proximal.  Neck: Inspection mild increase in lordosis.  No palpable stepoffs. Negative Spurling's  maneuver. Full neck range of motion Grip strength and sensation normal in bilateral hands Strength good C4 to T1 distribution No sensory change to C4 to T1 Negative Hoffman sign bilaterally Reflexes normal    Impression and Recommendations:     This case required medical decision making of moderate complexity.

## 2014-03-03 NOTE — Assessment & Plan Note (Signed)
Patient is making good improvement. Patient encouraged to try the nitroglycerin patch again. Patient is also going to get a compression sleeve. We discussed icing protocol continuing the exercises on a regular regimen. Patient will try these changes and come back again in 4 weeks. As long as he is doing well we will start him on nordic exercises.   Spent greater than 25 minutes with patient face-to-face and had greater than 50% of counseling including as described above in assessment and plan.

## 2014-03-03 NOTE — Patient Instructions (Signed)
Good to see you Try the nitro again Posture on wall heels, butt shoulders and head for 5 minutes daily.  Monitor at eye level Tennis ball between shoulder blades on chair at work and consider car.  Exercises in handout Turmeric 500mg  twice daily.  See me again in 4 weeks.

## 2014-03-31 ENCOUNTER — Ambulatory Visit (INDEPENDENT_AMBULATORY_CARE_PROVIDER_SITE_OTHER): Payer: No Typology Code available for payment source | Admitting: Family Medicine

## 2014-03-31 ENCOUNTER — Encounter: Payer: Self-pay | Admitting: Family Medicine

## 2014-03-31 VITALS — BP 146/86 | HR 74 | Ht 73.0 in | Wt 201.0 lb

## 2014-03-31 DIAGNOSIS — IMO0002 Reserved for concepts with insufficient information to code with codable children: Secondary | ICD-10-CM

## 2014-03-31 DIAGNOSIS — M9981 Other biomechanical lesions of cervical region: Secondary | ICD-10-CM

## 2014-03-31 DIAGNOSIS — M542 Cervicalgia: Secondary | ICD-10-CM

## 2014-03-31 DIAGNOSIS — M999 Biomechanical lesion, unspecified: Secondary | ICD-10-CM | POA: Insufficient documentation

## 2014-03-31 DIAGNOSIS — S76319A Strain of muscle, fascia and tendon of the posterior muscle group at thigh level, unspecified thigh, initial encounter: Secondary | ICD-10-CM

## 2014-03-31 NOTE — Assessment & Plan Note (Signed)
Neck pain is still multifactorial. Patient will continue to work on postural exercises and he was given further upper back exercises that could be helpful. Patient did respond very well to osteopathic epilation will follow up again in 4 weeks for further evaluation.

## 2014-03-31 NOTE — Progress Notes (Signed)
[name] Matthew [name], [place] [address] Phone: [phone] Subjective:    CC: rt hamstring follow up and neck pain follow up.   ION:Matthew [name] is a 58 y.o. male coming in with complaint of right leg pain followup. Patient was seen previously and did have a partial hamstring tear. Patient was given home exercise program and encouraged to get a compression sleeve as well as the nitroglycerin. Patient is on phase 2 exercises. Patient states he continues to improve slowly. Patient is able to do either a local or biking for 60 minutes without any significant discomfort. Patient does have pain when he starts to get on a treadmill. Patient states that he keeps on an incline it does feel better. Denies any radiation of pain or any numbness. States overall though he continues to get better he is an 85-90% of his regular soft.  Patient is also complaining of some neck pain. Secondary to muscle balances. Patient was given postural exercises and discussed over-the-counter medications that could be beneficial. Patient states he has made some significant strides in this as well. Only has some mild discomfort when he is riding in the car. Patient's denies any radiation to the arms any numbness. Patient continues to have some stiffness but overall is feeling much better.     Past medical history, social, surgical and family history all reviewed in electronic medical record.   Review of Systems: No headache, visual changes, nausea, vomiting, diarrhea, constipation, dizziness, abdominal pain, skin rash, fevers, chills, night sweats, weight loss, swollen lymph nodes, body aches, joint swelling, muscle aches, chest pain, shortness of breath, mood changes.   Objective Blood pressure 146/86, pulse 74, height 6\' 1"  (1.854 m), weight 201 lb (91.173 kg), SpO2 98.00%.  General: No apparent distress alert and oriented x3 mood and affect normal, dressed  appropriately.  HEENT: Pupils equal, extraocular movements intact  Respiratory: Patient's speak in full sentences and does not appear short of breath  Cardiovascular: No lower extremity edema, non tender, no erythema  Skin: Warm dry intact with no signs of infection or rash on extremities or on axial skeleton.  Abdomen: Soft nontender  Neuro: Cranial nerves II through XII are intact, neurovascularly intact in all extremities with 2+ DTRs and 2+ pulses.  Lymph: No lymphadenopathy of posterior or anterior cervical chain or axillae bilaterally.  Gait normal with good balance and coordination.  MSK:  Non tender with full range of motion and good stability and symmetric strength and tone of shoulders, elbows, wrist, hip, and ankles bilaterally.  Knee: Right Normal to inspection with no erythema or effusion or obvious bony abnormalities. Palpation normal with no warmth, joint line tenderness, patellar tenderness, or condyle tenderness. ROM full in flexion and extension and lower leg rotation. Ligaments with solid consistent endpoints including ACL, PCL, LCL, MCL. Negative Mcmurray's, Apley's, and Thessalonian tests. Non painful patellar compression. Patellar glide without crepitus. Patellar and quadriceps tendons unremarkable. Hamstring on right side mild weakness with 5/5 strength compared to 5 out of 5 strength of the contralateral side. No gapping noted though. Neurovascularly intact distally No n tender on exam.  Contralateral hamstring unremarkable.    Neck: Inspection mild increase in lordosis.  No palpable stepoffs. Negative Spurling's maneuver. Full neck range of motion Grip strength and sensation normal in bilateral hands Strength good C4 to T1 distribution No sensory change to C4 to T1 Negative Hoffman sign bilaterally Reflexes normal Osteopathic findings Cervical C2 flexed rotated and  side bent left   Impression and Recommendations:     This case required medical decision  making of moderate complexity.

## 2014-03-31 NOTE — Assessment & Plan Note (Signed)
Decision today to treat with OMT was based on Physical Exam  After verbal consent patient was treated with HVLA techniques in cervical areas  Patient tolerated the procedure well with improvement in symptoms  Patient given exercises, stretches and lifestyle modifications  See medications in patient instructions if given  Patient will follow up in 4 weeks      

## 2014-03-31 NOTE — Assessment & Plan Note (Signed)
Patient is doing remarkably well at this time. Encourage him to continue the nitroglycerin as well as a compression. Patient was given face 3 exercises including strengthening with nordic exercises. Patient will continue to increase his activity as tolerated. Patient will come back earlier if any setbacks otherwise he'll followup again in 4-6 weeks for further evaluation. Spent greater than 25 minutes with patient face-to-face and had greater than 50% of counseling including as described above in assessment and plan.

## 2014-03-31 NOTE — Patient Instructions (Signed)
Good to see you.  Continue the exercises 3 times  A week and look at following link for nordic hamstring exercises [employer] For your neck work on Y-T-A exercises.  2 second holds in each position.  You are doing great shorten stride on elliptical and on treadmill  Incline better then decline.  See me again in 4 weeks.

## 2014-05-05 ENCOUNTER — Encounter: Payer: Self-pay | Admitting: Family Medicine

## 2014-05-05 ENCOUNTER — Ambulatory Visit (INDEPENDENT_AMBULATORY_CARE_PROVIDER_SITE_OTHER): Payer: No Typology Code available for payment source | Admitting: Family Medicine

## 2014-05-05 VITALS — BP 136/82 | HR 67 | Ht 73.0 in | Wt 198.0 lb

## 2014-05-05 DIAGNOSIS — Z5189 Encounter for other specified aftercare: Secondary | ICD-10-CM

## 2014-05-05 DIAGNOSIS — IMO0002 Reserved for concepts with insufficient information to code with codable children: Secondary | ICD-10-CM

## 2014-05-05 DIAGNOSIS — M999 Biomechanical lesion, unspecified: Secondary | ICD-10-CM

## 2014-05-05 DIAGNOSIS — G5701 Lesion of sciatic nerve, right lower limb: Secondary | ICD-10-CM | POA: Insufficient documentation

## 2014-05-05 DIAGNOSIS — M542 Cervicalgia: Secondary | ICD-10-CM

## 2014-05-05 DIAGNOSIS — G5700 Lesion of sciatic nerve, unspecified lower limb: Secondary | ICD-10-CM

## 2014-05-05 DIAGNOSIS — S76311D Strain of muscle, fascia and tendon of the posterior muscle group at thigh level, right thigh, subsequent encounter: Secondary | ICD-10-CM

## 2014-05-05 DIAGNOSIS — M9981 Other biomechanical lesions of cervical region: Secondary | ICD-10-CM

## 2014-05-05 NOTE — Assessment & Plan Note (Signed)
Piriformis Syndrome  Using an anatomical model, reviewed with the patient the structures involved and how they related to diagnosis. The patient indicated understanding.   The patient was given a handout from Dr. Arne Cleveland book "The Sports Medicine Patient Advisor" describing the anatomy and rehabilitation of the following condition: Piriformis Syndrome  Also given a handout with more extensive Piriformis stretching, hip flexor and abductor strengthening, ham stretching  Rec deep massage, explained self-massage with ball  return in 4 weeks.

## 2014-05-05 NOTE — Assessment & Plan Note (Signed)
Patient's neck pain seems to be improving. Patient's toe this has one area at the C2 region and seems to give him some discomfort. We discussed other manual massage strategies that could be beneficial. Patient will follow up with me again in 3-4 weeks for further evaluation and treatment.

## 2014-05-05 NOTE — Assessment & Plan Note (Signed)
Decision today to treat with OMT was based on Physical Exam  After verbal consent patient was treated with HVLA techniques in cervical areas  Patient tolerated the procedure well with improvement in symptoms  Patient given exercises, stretches and lifestyle modifications  See medications in patient instructions if given  Patient will follow up in 3-4 weeks

## 2014-05-05 NOTE — Progress Notes (Signed)
  [name] [address], [place] [address] Phone: [phone] Subjective:    CC: rt hamstring follow up and neck pain follow up.   [id number] Matthew [name] is a 58 y.o. male coming in with complaint of right leg pain followup. Patient was seen previously and did have a partial hamstring tear. He was doing 85-90% better with conservative therapy at last visit patient states overall he continues to do very well at 90-95% better. Patient states the pain is more specified to the right buttocks area. Patient has been wearing a tennis ball in the back right pocket which does give him some relief. Patient states he is having no pain in the hamstring itself. Continues to do the exercises fairly regularly.  Patient is also complaining of some neck pain. Secondary to muscle balances. Patient has been working on postural positioning the due to the holiday not doing the exercises quite so much. Denies any new symptoms. Patient has responded very well to osteopathic manipulation previously.     Past medical history, social, surgical and family history all reviewed in electronic medical record.   Review of Systems: No headache, visual changes, nausea, vomiting, diarrhea, constipation, dizziness, abdominal pain, skin rash, fevers, chills, night sweats, weight loss, swollen lymph nodes, body aches, joint swelling, muscle aches, chest pain, shortness of breath, mood changes.   Objective Blood pressure 136/82, pulse 67, height 6\' 1"  (6.812 m), weight 198 lb (89.812 kg), SpO2 97.00%.  General: No apparent distress alert and oriented x3 mood and affect normal, dressed appropriately.  HEENT: Pupils equal, extraocular movements intact  Respiratory: Patient's speak in full sentences and does not appear short of breath  Cardiovascular: No lower extremity edema, non tender, no erythema  Skin: Warm dry intact with no signs of infection or rash on extremities or on axial  skeleton.  Abdomen: Soft nontender  Neuro: Cranial nerves II through XII are intact, neurovascularly intact in all extremities with 2+ DTRs and 2+ pulses.  Lymph: No lymphadenopathy of posterior or anterior cervical chain or axillae bilaterally.  Gait normal with good balance and coordination.  MSK:  Non tender with full range of motion and good stability and symmetric strength and tone of shoulders, elbows, wrist, hip, and ankles bilaterally.  Knee: Right Normal to inspection with no erythema or effusion or obvious bony abnormalities. Palpation normal with no warmth, joint line tenderness, patellar tenderness, or condyle tenderness. ROM full in flexion and extension and lower leg rotation. Ligaments with solid consistent endpoints including ACL, PCL, LCL, MCL. Negative Mcmurray's, Apley's, and Thessalonian tests. Non painful patellar compression. Patellar glide without crepitus. Patellar and quadriceps tendons unremarkable. Hamstring with 5/5 strength compared to 5 out of 5 strength of the contralateral side. No gapping noted though. Neurovascularly intact distally No n tender on exam.  Positive Faber with weak hip abductors compared to the contralateral side. Contralateral hamstring unremarkable.    Neck: Inspection mild increase in lordosis.  No palpable stepoffs. Negative Spurling's maneuver. Full neck range of motion Grip strength and sensation normal in bilateral hands Strength good C4 to T1 distribution No sensory change to C4 to T1 Negative Hoffman sign bilaterally Reflexes normal  Osteopathic findings Cervical C2 flexed rotated and side bent left   Impression and Recommendations:     This case required medical decision making of moderate complexity.

## 2014-05-05 NOTE — Assessment & Plan Note (Signed)
Patient is doing markedly better at this time. Patient has stopped a nitroglycerin patch at this time. Patient is going to do more exercises for the piriformis and less for the hamstring itself. Patient continues to have pain though I would like to do another ultrasound and see if patient is having more of an issue bursitis. I think this is low likelihood. Patient will follow up with me again in 3-4 weeks for further evaluation and treatment for manipulation.  Spent greater than 25 minutes with patient face-to-face and had greater than 50% of counseling including as described above in assessment and plan.

## 2014-05-05 NOTE — Patient Instructions (Signed)
Good to see you New exercises 3 times a week. For piriformis and for groin strain.  Avoid any crunches.  Continue the exercises for the hamstring for another 3 weeks.  Overall you are doing great.  See me again  In 3-4 weeks.

## 2014-06-02 ENCOUNTER — Encounter: Payer: Self-pay | Admitting: Family Medicine

## 2014-06-02 ENCOUNTER — Ambulatory Visit (INDEPENDENT_AMBULATORY_CARE_PROVIDER_SITE_OTHER): Payer: No Typology Code available for payment source | Admitting: Family Medicine

## 2014-06-02 VITALS — BP 148/88 | HR 66 | Ht 73.0 in | Wt 195.0 lb

## 2014-06-02 DIAGNOSIS — G5700 Lesion of sciatic nerve, unspecified lower limb: Secondary | ICD-10-CM

## 2014-06-02 DIAGNOSIS — M999 Biomechanical lesion, unspecified: Secondary | ICD-10-CM

## 2014-06-02 DIAGNOSIS — M9981 Other biomechanical lesions of cervical region: Secondary | ICD-10-CM

## 2014-06-02 DIAGNOSIS — G5701 Lesion of sciatic nerve, right lower limb: Secondary | ICD-10-CM

## 2014-06-02 DIAGNOSIS — M542 Cervicalgia: Secondary | ICD-10-CM

## 2014-06-02 DIAGNOSIS — D509 Iron deficiency anemia, unspecified: Secondary | ICD-10-CM

## 2014-06-02 NOTE — Assessment & Plan Note (Signed)
Patient overall is doing much better. Encourage him to continue to work on the posterolateral side was at work and is working position. Patient is wearing good shoes and is avoiding certain activities that seems to exacerbate the problem. Patient is responding well to osteopathic manipulation will come back again in 4 weeks for further evaluation and treatment.

## 2014-06-02 NOTE — Progress Notes (Signed)
[name] [address], [place] [address] Phone: [phone] Subjective:    CC: rt hamstring follow up and neck pain follow up.   [id number] KEVEON AMSLER is a 58 y.o. male coming in with complaint of right leg pain followup. Patient was seen previously and did have a partial hamstring tear. Then more of a piriformis syndrome. Patient was given exercises and discontinued a nitroglycerin patch. Patient was to do icing we discussed proper shoe wear. Patient also had what appeared to be a potential groin strain. We avoided any significant purchase. Patient states he is not having any pain at all. Patient is doing relatively well. Patient has been able to do all activities without any discomfort and is running on a regular basis.  Patient is also complaining of some neck pain. Secondary to muscle balances. Patient states the pain in his neck is significantly better as well. Patient is very happy with the regimen that he is on at this time. Patient is able to do more activity and has been feeling relatively good. Patient does complain of some increasing fatigue. Patient does have a past medical history significant for iron depletion. Patient went to give blood and was denied secondary to anemia he states. Patient is a little more fatigue at the moment and was wondering what else he could possibly do.   looking at patient's last ferritin 2012 had a value of 6.      Past medical history, social, surgical and family history all reviewed in electronic medical record.   Review of Systems: No headache, visual changes, nausea, vomiting, diarrhea, constipation, dizziness, abdominal pain, skin rash, fevers, chills, night sweats, weight loss, swollen lymph nodes, body aches, joint swelling, muscle aches, chest pain, shortness of breath, mood changes.   Objective Blood pressure 148/88, pulse 66, height 6\' 1"  (1.854 m), weight 195 lb (88.451 kg), SpO2 99.00%.    General: No apparent distress alert and oriented x3 mood and affect normal, dressed appropriately.  HEENT: Pupils equal, extraocular movements intact  Respiratory: Patient's speak in full sentences and does not appear short of breath  Cardiovascular: No lower extremity edema, non tender, no erythema  Skin: Warm dry intact with no signs of infection or rash on extremities or on axial skeleton.  Abdomen: Soft nontender  Neuro: Cranial nerves II through XII are intact, neurovascularly intact in all extremities with 2+ DTRs and 2+ pulses.  Lymph: No lymphadenopathy of posterior or anterior cervical chain or axillae bilaterally.  Gait normal with good balance and coordination.  MSK:  Non tender with full range of motion and good stability and symmetric strength and tone of shoulders, elbows, wrist, hip, and ankles bilaterally.  Knee: Right Normal to inspection with no erythema or effusion or obvious bony abnormalities. Palpation normal with no warmth, joint line tenderness, patellar tenderness, or condyle tenderness. ROM full in flexion and extension and lower leg rotation. Ligaments with solid consistent endpoints including ACL, PCL, LCL, MCL. Negative Mcmurray's, Apley's, and Thessalonian tests. Non painful patellar compression. Patellar glide without crepitus. Patellar and quadriceps tendons unremarkable. Hamstring with 5/5 strength compared to 5 out of 5 strength of the contralateral side.  Negative Faber Contralateral hamstring unremarkable.    Neck: Inspection mild increase in lordosis.  No palpable stepoffs. Negative Spurling's maneuver. Full neck range of motion Grip strength and sensation normal in bilateral hands Strength good C4 to T1 distribution No sensory change to C4 to T1 Negative Hoffman sign bilaterally Reflexes  normal  Osteopathic findings Cervical C2 flexed rotated and side bent left C4 flexed rotated and side bent right   Impression and Recommendations:      This case required medical decision making of moderate complexity.

## 2014-06-02 NOTE — Assessment & Plan Note (Signed)
Patient has had anemia for multiple years. Patient has tried supplementation before with some good results. We are going to start again with over-the-counter medications. In addition this patient will do vitamin C supplementation at the same time to increase reabsorption as well as Colace to avoid any significant discomfort. Patient will try this and we can get lab results in 4-6 weeks to make sure that the iron has been absorbed properly. Otherwise we should consider other labs as well.

## 2014-06-02 NOTE — Assessment & Plan Note (Signed)
Decision today to treat with OMT was based on Physical Exam  After verbal consent patient was treated with HVLA techniques in cervical areas  Patient tolerated the procedure well with improvement in symptoms  Patient given exercises, stretches and lifestyle modifications  See medications in patient instructions if given  Patient will follow up in 4 weeks

## 2014-06-02 NOTE — Assessment & Plan Note (Signed)
Patient is doing remarkably better at this time. Encourage him to continue the exercises 3 times a week for another 6 weeks. We discussed the icing as well for manual massage. Patient will followup again in 4 weeks for further evaluation.

## 2014-06-02 NOTE — Patient Instructions (Signed)
Good to see you as always.  Keep doing what you are doing Whey protein isolate 4:1 ration of carbs to protein after exercise example chocolate milk 1 cup with whey protein and frozen fruit.  Never more than 2-3 hours without a snack.  Limit carbs after 6  Pm.  Iron 325mg  daily (27mg  on the bottle) take with 500mg  Vitamin C and colace/Ducolax.  See you in a month.

## 2014-06-11 ENCOUNTER — Telehealth: Payer: Self-pay | Admitting: *Deleted

## 2014-06-11 DIAGNOSIS — D509 Iron deficiency anemia, unspecified: Secondary | ICD-10-CM

## 2014-06-11 NOTE — Telephone Encounter (Signed)
Pt called and stated md was going to do some labs on him. Don't see in epic. Pt want to come in tomorrow morning for labs need order entered...Matthew [name]

## 2014-06-12 ENCOUNTER — Other Ambulatory Visit (INDEPENDENT_AMBULATORY_CARE_PROVIDER_SITE_OTHER): Payer: No Typology Code available for payment source

## 2014-06-12 DIAGNOSIS — D509 Iron deficiency anemia, unspecified: Secondary | ICD-10-CM

## 2014-06-12 LAB — IBC PANEL
IRON: 225 ug/dL — AB (ref 42–165)
Saturation Ratios: 45.4 % (ref 20.0–50.0)
Transferrin: 354 mg/dL (ref 212.0–360.0)

## 2014-06-12 LAB — CBC WITH DIFFERENTIAL/PLATELET
BASOS ABS: 0 10*3/uL (ref 0.0–0.1)
Basophils Relative: 0.6 % (ref 0.0–3.0)
EOS ABS: 0.2 10*3/uL (ref 0.0–0.7)
Eosinophils Relative: 2.9 % (ref 0.0–5.0)
HCT: 37.4 % — ABNORMAL LOW (ref 39.0–52.0)
Hemoglobin: 12.5 g/dL — ABNORMAL LOW (ref 13.0–17.0)
LYMPHS PCT: 24.1 % (ref 12.0–46.0)
Lymphs Abs: 1.4 10*3/uL (ref 0.7–4.0)
MCHC: 33.5 g/dL (ref 30.0–36.0)
MCV: 90.3 fl (ref 78.0–100.0)
Monocytes Absolute: 0.7 10*3/uL (ref 0.1–1.0)
Monocytes Relative: 11.7 % (ref 3.0–12.0)
NEUTROS ABS: 3.5 10*3/uL (ref 1.4–7.7)
Neutrophils Relative %: 60.7 % (ref 43.0–77.0)
Platelets: 341 10*3/uL (ref 150.0–400.0)
RBC: 4.14 Mil/uL — ABNORMAL LOW (ref 4.22–5.81)
RDW: 17 % — AB (ref 11.5–15.5)
WBC: 5.7 10*3/uL (ref 4.0–10.5)

## 2014-06-12 LAB — FERRITIN: Ferritin: 15.3 ng/mL — ABNORMAL LOW (ref 22.0–322.0)

## 2014-06-12 NOTE — Telephone Encounter (Signed)
Called patient told him labs are good to go.

## 2014-06-13 ENCOUNTER — Encounter: Payer: Self-pay | Admitting: Family Medicine

## 2014-06-24 ENCOUNTER — Other Ambulatory Visit: Payer: Self-pay | Admitting: Family Medicine

## 2014-07-08 ENCOUNTER — Encounter: Payer: Self-pay | Admitting: Family Medicine

## 2014-07-08 ENCOUNTER — Ambulatory Visit (INDEPENDENT_AMBULATORY_CARE_PROVIDER_SITE_OTHER): Payer: No Typology Code available for payment source | Admitting: Family Medicine

## 2014-07-08 VITALS — BP 140/82 | HR 72 | Ht 73.0 in | Wt 197.0 lb

## 2014-07-08 DIAGNOSIS — M999 Biomechanical lesion, unspecified: Secondary | ICD-10-CM

## 2014-07-08 DIAGNOSIS — R0982 Postnasal drip: Secondary | ICD-10-CM | POA: Insufficient documentation

## 2014-07-08 DIAGNOSIS — M9981 Other biomechanical lesions of cervical region: Secondary | ICD-10-CM

## 2014-07-08 DIAGNOSIS — D509 Iron deficiency anemia, unspecified: Secondary | ICD-10-CM

## 2014-07-08 DIAGNOSIS — M542 Cervicalgia: Secondary | ICD-10-CM

## 2014-07-08 NOTE — Patient Instructions (Addendum)
Great to see you as always.  I am sure I will see you at the gym.  Your ferritin is improving slowly which is great, will check again in 4 weeks.  Continue what you are doing! See you on the second.  Dr. Redmond Baseman will be calling you.

## 2014-07-08 NOTE — Progress Notes (Signed)
  [name] [address], [place] [address] Phone: [phone] Subjective:    CC: Neck pain, fatigue, postnasal drip   [id number] Matthew [name] is a 58 y.o. male coming in with complaint of fatigue. Patient was found to have a significant iron deficiency previously. Patient has been doing iron out 2 times daily and has noted some improvement. Patient ferritin level has been increasing and we'll be checking again in 4 weeks. Patient states that most of the time he has more energy and is feeling much more like himself. Denies any new symptoms such as weight loss.  The patient is complaining of a new problem of a postnasal drip. We discussed patient has been seen by a specialist previously and has been treated for gastroesophageal reflux disease as well as chronic sinusitis without any significant improvement. Patient is on an as per at this time and is not notice any significant improvement. Patient is no longer taking any be anti-reflex medication. Patient is wondering if he could see a potential specialist.        Past medical history, social, surgical and family history all reviewed in electronic medical record.   Review of Systems: No headache, visual changes, nausea, vomiting, diarrhea, constipation, dizziness, abdominal pain, skin rash, fevers, chills, night sweats, weight loss, swollen lymph nodes, body aches, joint swelling, muscle aches, chest pain, shortness of breath, mood changes.   Objective Blood pressure 140/82, pulse 72, height 6\' 1"  (1.854 m), weight 197 lb (89.359 kg), SpO2 99.00%.  General: No apparent distress alert and oriented x3 mood and affect normal, dressed appropriately.  HEENT: Pupils equal, extraocular movements intact, patient's posterior pharynx does have mild erythema. Mild postnasal drip noted. Respiratory: Patient's speak in full sentences and does not appear short of breath  Cardiovascular: No lower extremity  edema, non tender, no erythema  Skin: Warm dry intact with no signs of infection or rash on extremities or on axial skeleton.  Abdomen: Soft nontender  Neuro: Cranial nerves II through XII are intact, neurovascularly intact in all extremities with 2+ DTRs and 2+ pulses.  Lymph: No lymphadenopathy of posterior or anterior cervical chain or axillae bilaterally.  Gait normal with good balance and coordination.  MSK:  Non tender with full range of motion and good stability and symmetric strength and tone of shoulders, elbows, wrist, knees, hip, and ankles bilaterally.   Neck: Inspection mild increase in lordosis.  No palpable stepoffs. Negative Spurling's maneuver. Full neck range of motion Grip strength and sensation normal in bilateral hands Strength good C4 to T1 distribution No sensory change to C4 to T1 Negative Hoffman sign bilaterally Reflexes normal  Osteopathic findings Cervical C2 flexed rotated and side bent left C4 flexed rotated and side bent right  Thoracic T3 extended rotated and side bent right T7 extended rotated and side bent left  Lumbar L3 Flexed rotated rotated and side bent right   Impression and Recommendations:     This case required medical decision making of moderate complexity.

## 2014-07-08 NOTE — Assessment & Plan Note (Signed)
Decision today to treat with OMT was based on Physical Exam  After verbal consent patient was treated with HVLA techniques in cervical, thoracic, and lumbar areas  Patient tolerated the procedure well with improvement in symptoms  Patient given exercises, stretches and lifestyle modifications  See medications in patient instructions if given  Patient will follow up in 4 weeks

## 2014-07-08 NOTE — Assessment & Plan Note (Signed)
Patient's neck pain overall seems to be improving. Patient continues to respond very well to osteopathic manipulation. Patient was given new postural exercises I think will be beneficial. Patient will continue the over-the-counter supplementation. Patient is still remaining very active. Patient will come back and see me again in 4-6 weeks for further evaluation.

## 2014-07-08 NOTE — Assessment & Plan Note (Signed)
Patient is making some improvement with his hemoglobin overall. Patient is also making some improvement in his iron levels. Patient was given the suggestion to take MiraLax to help with any side effects to the medications. Patient and will come back and see me again in one month. At that time we will recheck patient ferritin level.

## 2014-07-10 ENCOUNTER — Encounter: Payer: Self-pay | Admitting: Family Medicine

## 2014-07-31 ENCOUNTER — Other Ambulatory Visit (INDEPENDENT_AMBULATORY_CARE_PROVIDER_SITE_OTHER): Payer: No Typology Code available for payment source

## 2014-07-31 DIAGNOSIS — D509 Iron deficiency anemia, unspecified: Secondary | ICD-10-CM

## 2014-07-31 LAB — IBC PANEL
Iron: 128 ug/dL (ref 42–165)
Saturation Ratios: 29 % (ref 20.0–50.0)
TRANSFERRIN: 314.9 mg/dL (ref 212.0–360.0)

## 2014-07-31 LAB — FERRITIN: Ferritin: 22.1 ng/mL (ref 22.0–322.0)

## 2014-08-01 ENCOUNTER — Encounter: Payer: Self-pay | Admitting: Family Medicine

## 2014-08-01 ENCOUNTER — Ambulatory Visit (INDEPENDENT_AMBULATORY_CARE_PROVIDER_SITE_OTHER): Payer: No Typology Code available for payment source | Admitting: Family Medicine

## 2014-08-01 VITALS — BP 128/72 | HR 71 | Ht 73.0 in | Wt 197.0 lb

## 2014-08-01 DIAGNOSIS — D509 Iron deficiency anemia, unspecified: Secondary | ICD-10-CM

## 2014-08-01 DIAGNOSIS — M542 Cervicalgia: Secondary | ICD-10-CM

## 2014-08-01 DIAGNOSIS — M999 Biomechanical lesion, unspecified: Secondary | ICD-10-CM

## 2014-08-01 DIAGNOSIS — K219 Gastro-esophageal reflux disease without esophagitis: Secondary | ICD-10-CM

## 2014-08-01 DIAGNOSIS — M9901 Segmental and somatic dysfunction of cervical region: Secondary | ICD-10-CM

## 2014-08-01 DIAGNOSIS — Z23 Encounter for immunization: Secondary | ICD-10-CM

## 2014-08-01 MED ORDER — ESOMEPRAZOLE MAGNESIUM 40 MG PO CPDR: 40.0000 mg | DELAYED_RELEASE_CAPSULE | Freq: Every day | ORAL | Status: DC

## 2014-08-01 NOTE — Assessment & Plan Note (Signed)
Patient has made improvement with his ferritin level. Encourage him to continue to twice daily and patient will continue Maalox as well. We discussed continuing the vitamin C to help with absorption. Patient will make no other significant changes at this time.

## 2014-08-01 NOTE — Patient Instructions (Addendum)
Try prilosec or nexium in AM right when you wake up Continue what you are doing  With workiing out more then 45 minutes gatorade. Add 1/4 cup to water.  Good luck with the tournament  See me when you need me.

## 2014-08-01 NOTE — Progress Notes (Signed)
  [name] [address] [name], [place] [address] Phone: [phone] Subjective:    CC: Neck pain, fatigue  [id number] Matthew [name] is a 58 y.o. male coming in with complaint of fatigue. Patient was found to have a significant iron deficiency previously. Patient has been doing iron out 2 times daily and has noted some improvement. Patient did have ferritin levels checked and did make another increase at 22.1. Patient has noticed that he has not felt quite as fatigue at the end of days and states that the stiffness is somewhat better.  The patient is complaining of a continued feeling in the back of his throat. Patient actually has noticed when he drinks coffee her right groin it seems to be somewhat more. Patient states when he lays down at night he can have some mild discomfort in the back of his throat is well. Denies any fevers or chills or any abnormal weight loss.  Neck pain has been improving. Continues to do the exercises routinely. Patient is still very active and he started playing lacrosse. Patient states though that he is doing well overall. Some mild aches and pains but no true injuries.     Past medical history, social, surgical and family history all reviewed in electronic medical record.   Review of Systems: No headache, visual changes, nausea, vomiting, diarrhea, constipation, dizziness, abdominal pain, skin rash, fevers, chills, night sweats, weight loss, swollen lymph nodes, body aches, joint swelling, muscle aches, chest pain, shortness of breath, mood changes.   Objective Blood pressure 128/72, pulse 71, height 6\' 1"  (1.854 m), weight 197 lb (89.359 kg), SpO2 99.00%.  General: No apparent distress alert and oriented x3 mood and affect normal, dressed appropriately.  HEENT: Pupils equal, extraocular movements intact, patient's posterior pharynx does have mild erythema. Mild postnasal drip noted. Respiratory: Patient's speak in full  sentences and does not appear short of breath  Cardiovascular: No lower extremity edema, non tender, no erythema  Skin: Warm dry intact with no signs of infection or rash on extremities or on axial skeleton.  Abdomen: Soft nontender  Neuro: Cranial nerves II through XII are intact, neurovascularly intact in all extremities with 2+ DTRs and 2+ pulses.  Lymph: No lymphadenopathy of posterior or anterior cervical chain or axillae bilaterally.  Gait normal with good balance and coordination.  MSK:  Non tender with full range of motion and good stability and symmetric strength and tone of shoulders, elbows, wrist, knees, hip, and ankles bilaterally.   Neck: Inspection mild increase in lordosis.  No palpable stepoffs. Negative Spurling's maneuver. Full neck range of motion Grip strength and sensation normal in bilateral hands Strength good C4 to T1 distribution No sensory change to C4 to T1 Negative Hoffman sign bilaterally Reflexes normal  Osteopathic findings Cervical C2 flexed rotated and side bent left C4 flexed rotated and side bent right  Thoracic T3 extended rotated and side bent right  Lumbar L3 Flexed rotated rotated and side bent right   Impression and Recommendations:     This case required medical decision making of moderate complexity.

## 2014-08-01 NOTE — Assessment & Plan Note (Signed)
Decision today to treat with OMT was based on Physical Exam  After verbal consent patient was treated with HVLA techniques in cervical, thoracic, and lumbar areas  Patient tolerated the procedure well with improvement in symptoms  Patient given exercises, stretches and lifestyle modifications  See medications in patient instructions if given  Patient will follow up in 4 weeks

## 2014-08-01 NOTE — Assessment & Plan Note (Signed)
I believe with patient at this time the patient's postnasal drip is actually more secondary to gastroesophageal reflux disease secondary to the association with food. Differential also includes allergic reaction to the foot itself. Patient will be started on a proton pump inhibitor medication. Patient will do a trial of this and see if he makes any significant improvement. He may want to consider B12 supplementation with this medicine. We'll see how patient does with that and follow up again in 2-4 weeks to discuss the patient is responding.

## 2014-08-05 ENCOUNTER — Encounter: Payer: Self-pay | Admitting: Family Medicine

## 2014-09-18 ENCOUNTER — Encounter: Payer: Self-pay | Admitting: Family Medicine

## 2014-09-18 ENCOUNTER — Other Ambulatory Visit (INDEPENDENT_AMBULATORY_CARE_PROVIDER_SITE_OTHER): Payer: No Typology Code available for payment source

## 2014-09-18 ENCOUNTER — Ambulatory Visit (INDEPENDENT_AMBULATORY_CARE_PROVIDER_SITE_OTHER): Payer: No Typology Code available for payment source | Admitting: Family Medicine

## 2014-09-18 VITALS — BP 118/82 | HR 63 | Ht 73.0 in | Wt 195.0 lb

## 2014-09-18 DIAGNOSIS — S83242A Other tear of medial meniscus, current injury, left knee, initial encounter: Secondary | ICD-10-CM | POA: Insufficient documentation

## 2014-09-18 DIAGNOSIS — M25562 Pain in left knee: Secondary | ICD-10-CM

## 2014-09-18 DIAGNOSIS — D509 Iron deficiency anemia, unspecified: Secondary | ICD-10-CM

## 2014-09-18 DIAGNOSIS — K219 Gastro-esophageal reflux disease without esophagitis: Secondary | ICD-10-CM

## 2014-09-18 NOTE — Patient Instructions (Addendum)
Phase out the nose spray for a week.  Continue the nexium Start fomantidine or rantidine 1-2 times daily.  Pennsaid twice daily for next 10 days then as needed.  Ice 20 minutes 2 times daily. Usually after activity and before bed. Exercises 3 times a week.  OK to bike or ellptical  Avoid twisting.  Avoid deep squats as of now.  See me before tournament.

## 2014-09-18 NOTE — Assessment & Plan Note (Signed)
Postsurgical changes noted the patient does have an acute on chronic meniscal tear that is approximately 20% of the tendon that is still remaining. There is no significant displacement. I think patient will do well with conservative therapy. Patient was given topical anti-inflammatories a try. Will avoid oral anti-inflammatories secondary to patient's gastroesophageal reflux disease. Patient was given a home exercise program and icing protocol. We will avoid any bracing at this time. Patient continues to have difficulty we can consider bracing as well as injection but we will discuss it further at follow-up in the next 3 weeks.  Spent greater than 25 minutes with patient face-to-face and had greater than 50% of counseling including as described above in assessment and plan.

## 2014-09-18 NOTE — Assessment & Plan Note (Signed)
Added H2 blocker

## 2014-09-18 NOTE — Progress Notes (Signed)
[name] [address] [place], [place] [address] Phone: [phone] Subjective:     CC: medial knee pain.   XTG:GYIRSWNIOE Matthew [name] is a 58 y.o. male coming in with complaint of medial knee pain.  Patient states that he did play a significant amount of Lacrosse but then during a yoga class he had pain all the second on the medial aspect of his knee. Patient states for 2 days and was extremely sore. Over the course last week it has slowly improved but still giving him difficulty when he does some twisting rotation. Denies any swelling, denies any radiation denies any numbness. Rates the severity of pain a 6 out of 10. Denies any nighttime awakening. Patient has not tried any home modalities at this time. Patient still is working out fairly regularly.  Patient also had gastroesophageal reflux disease. Patient is now taking Nexium and is feeling much better. Patient though still has some mild discomfort especially after having coffee. Patient is wondering if there is anything else that can be done.      Past medical history, social, surgical and family history all reviewed in electronic medical record.   Review of Systems: No headache, visual changes, nausea, vomiting, diarrhea, constipation, dizziness, abdominal pain, skin rash, fevers, chills, night sweats, weight loss, swollen lymph nodes, body aches, joint swelling, muscle aches, chest pain, shortness of breath, mood changes.   Objective Blood pressure 118/82, pulse 63, height 6\' 1"  (1.854 m), weight 195 lb (88.451 kg), SpO2 98 %.  General: No apparent distress alert and oriented x3 mood and affect normal, dressed appropriately.  HEENT: Pupils equal, extraocular movements intact  Respiratory: Patient's speak in full sentences and does not appear short of breath  Cardiovascular: No lower extremity edema, non tender, no erythema  Skin: Warm dry intact with no signs of infection or rash on extremities  or on axial skeleton.  Abdomen: Soft nontender  Neuro: Cranial nerves II through XII are intact, neurovascularly intact in all extremities with 2+ DTRs and 2+ pulses.  Lymph: No lymphadenopathy of posterior or anterior cervical chain or axillae bilaterally.  Gait normal with good balance and coordination.  MSK:  Non tender with full range of motion and good stability and symmetric strength and tone of shoulders, elbows, wrist, hip, and ankles bilaterally.  Knee: Left Normal to inspection with no erythema or effusion or obvious bony abnormalities. Palpation normal with no warmth, joint line tenderness, patellar tenderness, or condyle tenderness. ROM full in flexion and extension and lower leg rotation. Ligaments with solid consistent endpoints including ACL, PCL, LCL, MCL. Positive Mcmurray's, Apley's, and Thessalonian tests. Non painful patellar compression. Patellar glide without crepitus. Patellar and quadriceps tendons unremarkable. Hamstring and quadriceps strength is normal.  Contralateral knee unremarkable  MSK US performed of: Right knee This study was ordered, performed, and interpreted by [name] D.O.  Knee: All structures visualized. Patient's posterior medial meniscus has been removed. Patient does have some pulsatile changes of the medial meniscus but does have a new acute tear noted with increasing Doppler flow. No significant hypoechoic changes and no displacement noted. Patellar Tendon unremarkable on long and transverse views without effusion. No abnormality of prepatellar bursa. LCL and MCL unremarkable on long and transverse views. No abnormality of origin of medial or lateral head of the gastrocnemius.  IMPRESSION:  Acute on chronic medial meniscal tear with previous surgery noted.    Impression and Recommendations:     This case required medical  decision making of moderate complexity.

## 2014-10-01 ENCOUNTER — Other Ambulatory Visit (INDEPENDENT_AMBULATORY_CARE_PROVIDER_SITE_OTHER): Payer: No Typology Code available for payment source

## 2014-10-01 DIAGNOSIS — D509 Iron deficiency anemia, unspecified: Secondary | ICD-10-CM

## 2014-10-01 LAB — FERRITIN: Ferritin: 44.1 ng/mL (ref 22.0–322.0)

## 2014-10-02 ENCOUNTER — Encounter: Payer: Self-pay | Admitting: Family Medicine

## 2014-10-02 ENCOUNTER — Ambulatory Visit (INDEPENDENT_AMBULATORY_CARE_PROVIDER_SITE_OTHER): Payer: No Typology Code available for payment source | Admitting: Family Medicine

## 2014-10-02 VITALS — BP 112/76 | HR 84 | Ht 73.0 in | Wt 197.0 lb

## 2014-10-02 DIAGNOSIS — K219 Gastro-esophageal reflux disease without esophagitis: Secondary | ICD-10-CM

## 2014-10-02 DIAGNOSIS — S83242D Other tear of medial meniscus, current injury, left knee, subsequent encounter: Secondary | ICD-10-CM

## 2014-10-02 DIAGNOSIS — D509 Iron deficiency anemia, unspecified: Secondary | ICD-10-CM

## 2014-10-02 NOTE — Assessment & Plan Note (Signed)
Discussed with patient with training for marathon he should cross train 30% of his training. Also discussed continuing the icing. Patient given sedation of a compression sleeve which I think will be helpful. Patient will try these changes and will follow-up with me in 4 weeks if not completely resolved.

## 2014-10-02 NOTE — Assessment & Plan Note (Signed)
Patient is doing very well with the our instrumentation. Patient will continue the twice a day dosing because he is having more energy and feeling well with no GI discomfort.

## 2014-10-02 NOTE — Progress Notes (Signed)
[name] [address] [place], [place] [address] Phone: [phone] Subjective:     CC: medial knee pain follow up  Matthew [name] is a 58 y.o. male coming in with complaint of medial knee pain.  Patient was found to have an acute on chronic medial meniscal tear status post surgical intervention. We discussed conservative therapy. Patient was given bracing, icing, home exercises as well as topical anti-inflammatories to try. Patient states overall it is feeling much better. Patient is starting to train for marathon. Patient states that he has some mild dull achiness but otherwise feels relatively well. Denies any clicking, popping or giving out on him. Overall states that he is approximately 80% better.  Patient also had gastroesophageal reflux disease.seems to be stable now that we have added an H2 blocker..  Patient also has known iron deficiency. Patient easily had new ferritin level which has increased again. Patient is now at 38. This is within regular range and is optimal for athletes. Patient has had significant increase in energy and has been feeling significantly better.     Past medical history, social, surgical and family history all reviewed in electronic medical record.   Review of Systems: No headache, visual changes, nausea, vomiting, diarrhea, constipation, dizziness, abdominal pain, skin rash, fevers, chills, night sweats, weight loss, swollen lymph nodes, body aches, joint swelling, muscle aches, chest pain, shortness of breath, mood changes.   Objective Blood pressure 112/76, pulse 84, height 6\' 1"  (1.854 m), weight 197 lb (89.359 kg), SpO2 98 %.  General: No apparent distress alert and oriented x3 mood and affect normal, dressed appropriately.  HEENT: Pupils equal, extraocular movements intact  Respiratory: Patient's speak in full sentences and does not appear short of breath  Cardiovascular: No lower extremity edema, non  tender, no erythema  Skin: Warm dry intact with no signs of infection or rash on extremities or on axial skeleton.  Abdomen: Soft nontender  Neuro: Cranial nerves II through XII are intact, neurovascularly intact in all extremities with 2+ DTRs and 2+ pulses.  Lymph: No lymphadenopathy of posterior or anterior cervical chain or axillae bilaterally.  Gait normal with good balance and coordination.  MSK:  Non tender with full range of motion and good stability and symmetric strength and tone of shoulders, elbows, wrist, hip, and ankles bilaterally.  Knee: Left Normal to inspection with no erythema or effusion or obvious bony abnormalities. Palpation normal with no warmth, joint line tenderness, patellar tenderness, or condyle tenderness. ROM full in flexion and extension and lower leg rotation. Ligaments with solid consistent endpoints including ACL, PCL, LCL, MCL. negativeMcmurray's, Apley's, and Thessalonian tests. Non painful patellar compression. Patellar glide without crepitus. Patellar and quadriceps tendons unremarkable. Hamstring and quadriceps strength is normal.  Contralateral knee unremarkable  MSK US performed of: Right knee This study was ordered, performed, and interpreted by [name] D.O.  Knee: All structures visualized. Patient's posterior medial meniscus has been removed. Patient's meniscus still has tear appreciated but no significant hypoechoic changes. Mild interval healing noted.Doppler flow. No significant hypoechoic changes and no displacement noted. Patellar Tendon unremarkable on long and transverse views without effusion. No abnormality of prepatellar bursa. LCL and MCL unremarkable on long and transverse views. No abnormality of origin of medial or lateral head of the gastrocnemius.  IMPRESSION:  Healing medial meniscal tear    Impression and Recommendations:     This case required medical decision making of moderate complexity.

## 2014-10-02 NOTE — Assessment & Plan Note (Signed)
Continue current therapy. We discussed also about B12 supplementation with chronic proton pump inhibitor. Patient will decide if he would like to take this over-the-counter or not. We can test if any fatigue starts to occur.

## 2014-10-02 NOTE — Patient Instructions (Signed)
Good to see you Your knee is doing great Do at least 3rd of training on a bike or spin.  Ice still afterwards [email or web address] knee sleeve size medium.  Consider B12 1045mcg daily Iron continue same dose Continue nose spray.  See me when you need me.

## 2014-10-20 ENCOUNTER — Other Ambulatory Visit: Payer: Self-pay | Admitting: Family Medicine

## 2014-10-29 ENCOUNTER — Encounter: Payer: Self-pay | Admitting: Family Medicine

## 2014-10-29 MED ORDER — AMOXICILLIN-POT CLAVULANATE 875-125 MG PO TABS: 1.0000 | ORAL_TABLET | Freq: Two times a day (BID) | ORAL | Status: DC

## 2014-10-29 NOTE — Telephone Encounter (Signed)
Sent in rx for sinusitis, if worse then come back .

## 2014-11-30 ENCOUNTER — Encounter: Payer: Self-pay | Admitting: Family Medicine

## 2014-12-01 ENCOUNTER — Encounter: Payer: Self-pay | Admitting: Family Medicine

## 2014-12-01 ENCOUNTER — Ambulatory Visit (INDEPENDENT_AMBULATORY_CARE_PROVIDER_SITE_OTHER): Payer: BLUE CROSS/BLUE SHIELD | Admitting: Family Medicine

## 2014-12-01 VITALS — BP 130/80 | HR 61 | Ht 73.0 in | Wt 197.0 lb

## 2014-12-01 DIAGNOSIS — M62830 Muscle spasm of back: Secondary | ICD-10-CM

## 2014-12-01 NOTE — Assessment & Plan Note (Signed)
Believe the patient is having more of a back spasm. I think this back  Pain is more from tightness of the  Psoas muscle and possibly iliacus. Patient given range of motion and hip abductors. Discussed icing and compression shorts, Discussed if worsening, call immediatley.  NSIAD burst given F/u in 2 weeks.

## 2014-12-01 NOTE — Progress Notes (Signed)
Pre visit review using our clinic review tool, if applicable. No additional management support is needed unless otherwise documented below in the visit note. 

## 2014-12-01 NOTE — Patient Instructions (Addendum)
Good to see you.  Ice 20 minutes 3 times daily. Usually after activity and before bed. Nexium 2 times daily.  OK to add zantac in AM or pm whatever is better Alternate exercises 3 times a week Compression shorts Duexis 3 times daily for 6 days.  See me again in 2 weeks.

## 2014-12-01 NOTE — Progress Notes (Signed)
  [name] [address] [place], [place] [address] Phone: [phone] Subjective:    CC: Back pain.   OIN:OMVEHMCNOB Matthew [name] is a 59 y.o. male coming in with complaint of back pain. Patient states that this seems to be localized.patient states that this started after doing a lacrosse tournament. Patient felt pain in the left groin initially. Patient states that this did improve though. Patient states that more this seems to be returning on the left side of his body. Patient states that there is a very noticeable not the lower back that is very tender to palpation. Patient denies any radiation down the legs any numbness or tingling. Patient states though that it is sort making him have difficult he doing certain activities. Patient is concerned because he does have a lacrosse tournament next week and would like to be able to play.patient denies any nighttime awakening but states that it is very uncomfortable in the low back.patient states it has responded to ibuprofen.      Past medical history, social, surgical and family history all reviewed in electronic medical record.   Review of Systems: No headache, visual changes, nausea, vomiting, diarrhea, constipation, dizziness, abdominal pain, skin rash, fevers, chills, night sweats, weight loss, swollen lymph nodes, body aches, joint swelling, muscle aches, chest pain, shortness of breath, mood changes.   Objective Blood pressure 130/80, pulse 61, height 6\' 1"  (1.854 m), weight 197 lb (89.359 kg), SpO2 99 %.  General: No apparent distress alert and oriented x3 mood and affect normal, dressed appropriately.  HEENT: Pupils equal, extraocular movements intact  Respiratory: Patient's speak in full sentences and does not appear short of breath  Cardiovascular: No lower extremity edema, non tender, no erythema  Skin: Warm dry intact with no signs of infection or rash on extremities or on axial skeleton.  Abdomen:  Soft nontender  Neuro: Cranial nerves II through XII are intact, neurovascularly intact in all extremities with 2+ DTRs and 2+ pulses.  Lymph: No lymphadenopathy of posterior or anterior cervical chain or axillae bilaterally.  Gait normal with good balance and coordination.  MSK:  Non tender with full range of motion and good stability and symmetric strength and tone of shoulders, elbows, wrist, hip, knee and ankles bilaterally.  Back Exam:  Inspection: Unremarkable  Motion: Flexion 35 deg, Extension 35 deg, Side Bending to 35 deg bilaterally,  Rotation to 35 deg bilaterally  SLR laying: Negative  XSLR laying: Negative  Palpable tenderness: this is tender to palpation in the thoracolumbar junction in the paraspinal musculature on the left side. There is an area that appears to be some mild swelling mostly over the L2 area. FABER: negative. Sensory change: Gross sensation intact to all lumbar and sacral dermatomes.  Reflexes: 2+ at both patellar tendons, 2+ at achilles tendons, Babinski's downgoing.  Strength at foot  Plantar-flexion: 5/5 Dorsi-flexion: 5/5 Eversion: 5/5 Inversion: 5/5  Leg strength  Quad: 5/5 Hamstring: 5/5 Hip flexor: 5/5 Hip abductors: 4/5       Impression and Recommendations:     This case required medical decision making of moderate complexity.

## 2014-12-02 ENCOUNTER — Encounter: Payer: Self-pay | Admitting: Family Medicine

## 2014-12-15 ENCOUNTER — Encounter: Payer: Self-pay | Admitting: Family Medicine

## 2015-01-03 ENCOUNTER — Other Ambulatory Visit: Payer: Self-pay | Admitting: Family Medicine

## 2015-01-30 ENCOUNTER — Encounter: Payer: Self-pay | Admitting: Family Medicine

## 2015-01-30 ENCOUNTER — Ambulatory Visit (INDEPENDENT_AMBULATORY_CARE_PROVIDER_SITE_OTHER): Payer: BLUE CROSS/BLUE SHIELD | Admitting: Family Medicine

## 2015-01-30 VITALS — BP 132/82 | HR 72 | Ht 73.0 in | Wt 197.0 lb

## 2015-01-30 DIAGNOSIS — M62830 Muscle spasm of back: Secondary | ICD-10-CM | POA: Diagnosis not present

## 2015-01-30 MED ORDER — METHYLPREDNISOLONE ACETATE 80 MG/ML IJ SUSP
80.0000 mg | Freq: Once | INTRAMUSCULAR | Status: AC
  Administered 2015-01-30: 80 mg via INTRAMUSCULAR

## 2015-01-30 MED ORDER — KETOROLAC TROMETHAMINE 60 MG/2ML IM SOLN
60.0000 mg | Freq: Once | INTRAMUSCULAR | Status: AC
  Administered 2015-01-30: 60 mg via INTRAMUSCULAR

## 2015-01-30 MED ORDER — CYCLOBENZAPRINE HCL 10 MG PO TABS: 10.0000 mg | ORAL_TABLET | Freq: Three times a day (TID) | ORAL | Status: DC | PRN

## 2015-01-30 MED ORDER — TRAMADOL HCL 50 MG PO TABS: 50.0000 mg | ORAL_TABLET | Freq: Two times a day (BID) | ORAL | Status: DC | PRN

## 2015-01-30 NOTE — Progress Notes (Signed)
  [name] [address] [place], [place] [address] Phone: [phone] Subjective:    CC: Back pain follow up  CNO:[id number] Matthew [name] is a 59 y.o. male coming in with complaint of back pain.  Patient states the same area he's had previously seems to be significant worse. Patient states that it feels muscular in nature. Denies any fever, chills, or any abnormal weight loss. Patient has a treatment tomorrow in lacrosse and would like to play. Patient though it had difficulty sleeping even last night. Denies any bowel or bladder incontinence. Patient rates the severity of pain is 8 out of 10.      Past medical history, social, surgical and family history all reviewed in electronic medical record.   Review of Systems: No headache, visual changes, nausea, vomiting, diarrhea, constipation, dizziness, abdominal pain, skin rash, fevers, chills, night sweats, weight loss, swollen lymph nodes, body aches, joint swelling, muscle aches, chest pain, shortness of breath, mood changes.   Objective Blood pressure 132/82, pulse 72, height 6\' 1"  (1.854 m), weight 197 lb (89.359 kg), SpO2 98 %.  General: No apparent distress alert and oriented x3 mood and affect normal, dressed appropriately.  HEENT: Pupils equal, extraocular movements intact  Respiratory: Patient's speak in full sentences and does not appear short of breath  Cardiovascular: No lower extremity edema, non tender, no erythema  Skin: Warm dry intact with no signs of infection or rash on extremities or on axial skeleton.  Abdomen: Soft nontender  Neuro: Cranial nerves II through XII are intact, neurovascularly intact in all extremities with 2+ DTRs and 2+ pulses.  Lymph: No lymphadenopathy of posterior or anterior cervical chain or axillae bilaterally.  Gait normal with good balance and coordination.  MSK:  Non tender with full range of motion and good stability and symmetric strength and tone of  shoulders, elbows, wrist, hip, knee and ankles bilaterally.  Back Exam:  Inspection: Unremarkable  Motion: Flexion 35 deg, Extension 25 deg, Side Bending to 35 deg bilaterally,  Rotation to 35 deg bilaterally  SLR laying: Negative  XSLR laying: Negative  Palpable tenderness: this is tender to palpation in the thoracolumbar junction in the paraspinal musculature on the left side. Significant worse. Patient does have cystic lipoma in the area. Patient also has trigger points in the quadratus lumborum muscle. FABER: negative. Sensory change: Gross sensation intact to all lumbar and sacral dermatomes.  Reflexes: 2+ at both patellar tendons, 2+ at achilles tendons, Babinski's downgoing.  Strength at foot  Plantar-flexion: 5/5 Dorsi-flexion: 5/5 Eversion: 5/5 Inversion: 5/5  Leg strength  Quad: 5/5 Hamstring: 5/5 Hip flexor: 5/5 Hip abductors: 4/5       Impression and Recommendations:     This case required medical decision making of moderate complexity.

## 2015-01-30 NOTE — Progress Notes (Signed)
Pre visit review using our clinic review tool, if applicable. No additional management support is needed unless otherwise documented below in the visit note. 

## 2015-01-30 NOTE — Assessment & Plan Note (Signed)
Patient does have more of a muscle back spasm. Patient is having more severe than we seen previously. Patient does have some trigger points. We discussed the possibility of trigger point injections versus possible imaging. Patient elected try more of an oral approach with anti-inflammatories, Flexeril, as well as tramadol for breakthrough pain. Patient given a shot of Toradol and Depo-Medrol today. Patient is going to try to play if he can tolerate it. Patient has any worsening symptoms or any systemic red flags we discussed patient will seek medical attention immediately. Patient does not make any significant improvement over the next 72 hours he will call and we may need to consider further imaging.  Spent  25 minutes with patient face-to-face and had greater than 50% of counseling including as described above in assessment and plan.

## 2015-01-30 NOTE — Patient Instructions (Addendum)
Good to see you as always.  2 injections today Ice 20 minutes 2 times a day Flexeril up to 3 times daily Try the medicine 2 times a day for 6 days Call me on Monday and if not better we will need to consider MRI.

## 2015-02-15 ENCOUNTER — Other Ambulatory Visit: Payer: Self-pay | Admitting: Family Medicine

## 2015-02-16 NOTE — Telephone Encounter (Signed)
Is this okay?

## 2015-02-21 ENCOUNTER — Other Ambulatory Visit: Payer: Self-pay | Admitting: Family Medicine

## 2015-02-27 ENCOUNTER — Telehealth: Payer: Self-pay | Admitting: Family Medicine

## 2015-02-27 MED ORDER — HYDROXYZINE HCL 25 MG PO TABS: 25.0000 mg | ORAL_TABLET | Freq: Three times a day (TID) | ORAL | Status: DC | PRN

## 2015-02-27 NOTE — Telephone Encounter (Signed)
Sent in rx for sleep while on plane.

## 2015-04-09 ENCOUNTER — Other Ambulatory Visit: Payer: Self-pay | Admitting: Family Medicine

## 2015-04-10 ENCOUNTER — Encounter: Payer: Self-pay | Admitting: Family Medicine

## 2015-06-01 ENCOUNTER — Encounter: Payer: Self-pay | Admitting: Family Medicine

## 2015-06-08 ENCOUNTER — Encounter: Payer: Self-pay | Admitting: Family Medicine

## 2015-06-08 MED ORDER — CYCLOBENZAPRINE HCL 10 MG PO TABS: 10.0000 mg | ORAL_TABLET | Freq: Three times a day (TID) | ORAL | Status: DC | PRN

## 2015-06-10 ENCOUNTER — Encounter: Payer: Self-pay | Admitting: Family Medicine

## 2015-06-10 ENCOUNTER — Ambulatory Visit (INDEPENDENT_AMBULATORY_CARE_PROVIDER_SITE_OTHER): Payer: BLUE CROSS/BLUE SHIELD | Admitting: Family Medicine

## 2015-06-10 VITALS — BP 130/84 | HR 61 | Ht 73.0 in | Wt 195.0 lb

## 2015-06-10 DIAGNOSIS — M62830 Muscle spasm of back: Secondary | ICD-10-CM

## 2015-06-10 MED ORDER — METHYLPREDNISOLONE ACETATE 80 MG/ML IJ SUSP
80.0000 mg | Freq: Once | INTRAMUSCULAR | Status: AC
Start: 2015-06-10 — End: 2015-06-10
  Administered 2015-06-10: 80 mg via INTRAMUSCULAR

## 2015-06-10 MED ORDER — KETOROLAC TROMETHAMINE 60 MG/2ML IM SOLN
60.0000 mg | Freq: Once | INTRAMUSCULAR | Status: AC
  Administered 2015-06-10: 60 mg via INTRAMUSCULAR

## 2015-06-10 NOTE — Progress Notes (Signed)
  [name] [place], [place] [address] Phone: [phone] Subjective:    CC: Back pain follow up  HLK:TGYBWLSLHT DARUIS Matthew [name] is a 59 y.o. male coming in with complaint of back pain.  Patient has had recurrent spasms previously. Patient did take some time off and then started lifting. Patient states that he did have more tightness in the back. Patient was given a muscle relaxer. Patient states unfortunately had more of right-sided low back pain. Patient states that it was so severe he was unable to actually get up from a seated position. Patient states over the course of time though with the muscle relaxer it has improved. States that it's more tightness now. Patient denies any radiation down the legs or any numbness or tingling. Seems to be localized. An states that the severity is 7 out of 10. Now able to do daily activities. Patient states that sitting for long amount of time such as driving or it is very difficult to get up.     Past medical history, social, surgical and family history all reviewed in electronic medical record.   Review of Systems: No headache, visual changes, nausea, vomiting, diarrhea, constipation, dizziness, abdominal pain, skin rash, fevers, chills, night sweats, weight loss, swollen lymph nodes, body aches, joint swelling, muscle aches, chest pain, shortness of breath, mood changes.   Objective Blood pressure 130/84, pulse 61, height 6\' 1"  (1.854 m), weight 195 lb (88.451 kg), SpO2 99 %.  General: No apparent distress alert and oriented x3 mood and affect normal, dressed appropriately.  HEENT: Pupils equal, extraocular movements intact  Respiratory: Patient's speak in full sentences and does not appear short of breath  Cardiovascular: No lower extremity edema, non tender, no erythema  Skin: Warm dry intact with no signs of infection or rash on extremities or on axial skeleton.  Abdomen: Soft nontender  Neuro: Cranial  nerves II through XII are intact, neurovascularly intact in all extremities with 2+ DTRs and 2+ pulses.  Lymph: No lymphadenopathy of posterior or anterior cervical chain or axillae bilaterally.  Gait mild antalgic gait MSK:  Non tender with full range of motion and good stability and symmetric strength and tone of shoulders, elbows, wrist, hip, knee and ankles bilaterally.  Back Exam:  Inspection: Unremarkable  Motion: Flexion 35 deg, Extension 25 deg, Side Bending to 35 deg bilaterally,  Rotation to 35 deg bilaterally  SLR laying: Negative  XSLR laying: Negative  Palpable tenderness: t severe tenderness over the thoracolumbar junction on the right side as well as L3-L5. FABER: negative. Sensory change: Gross sensation intact to all lumbar and sacral dermatomes.  Reflexes: 2+ at both patellar tendons, 2+ at achilles tendons, Babinski's downgoing.  Strength at foot  Plantar-flexion: 5/5 Dorsi-flexion: 5/5 Eversion: 5/5 Inversion: 5/5  Leg strength  Quad: 5/5 Hamstring: 5/5 Hip flexor: 5/5 Hip abductors: 4/5       Impression and Recommendations:     This case required medical decision making of moderate complexity.

## 2015-06-10 NOTE — Progress Notes (Signed)
Pre visit review using our clinic review tool, if applicable. No additional management support is needed unless otherwise documented below in the visit note. 

## 2015-06-10 NOTE — Patient Instructions (Signed)
Good to see you Try double the socks or get turf shoes.  20 minutes heat 20 minutes ice then off as much as you want.  2 injections today Duexis starting tomorrow 1 pill 3 times a day for 3 days Muscle relaxer you have if needed See me again in 2 weeks and we will see manipulation.

## 2015-06-10 NOTE — Assessment & Plan Note (Signed)
It is as the patient is 7 and repeat muscle spasm. Patient does have some muscle relaxers. Patient given 2 injections today to help break some of the inflammation that is contributing to this. Patient will do a three-day course of anti-inflammatory's as well. Patient will alternate heat and ice. Patient has range of motion exercises. Patient will come back and see me again in 2 weeks to make sure that he is improving otherwise imaging would be warranted.

## 2015-06-15 ENCOUNTER — Other Ambulatory Visit: Payer: Self-pay | Admitting: Family Medicine

## 2015-07-23 ENCOUNTER — Encounter: Payer: Self-pay | Admitting: Family Medicine

## 2015-07-28 ENCOUNTER — Encounter: Payer: Self-pay | Admitting: Family Medicine

## 2015-07-28 ENCOUNTER — Ambulatory Visit (INDEPENDENT_AMBULATORY_CARE_PROVIDER_SITE_OTHER): Payer: BLUE CROSS/BLUE SHIELD | Admitting: Family Medicine

## 2015-07-28 VITALS — BP 118/80 | HR 76 | Ht 72.0 in | Wt 191.0 lb

## 2015-07-28 DIAGNOSIS — M542 Cervicalgia: Secondary | ICD-10-CM | POA: Diagnosis not present

## 2015-07-28 DIAGNOSIS — Z0000 Encounter for general adult medical examination without abnormal findings: Secondary | ICD-10-CM

## 2015-07-28 DIAGNOSIS — A6002 Herpesviral infection of other male genital organs: Secondary | ICD-10-CM

## 2015-07-28 DIAGNOSIS — J301 Allergic rhinitis due to pollen: Secondary | ICD-10-CM

## 2015-07-28 DIAGNOSIS — I10 Essential (primary) hypertension: Secondary | ICD-10-CM

## 2015-07-28 DIAGNOSIS — Z8639 Personal history of other endocrine, nutritional and metabolic disease: Secondary | ICD-10-CM

## 2015-07-28 DIAGNOSIS — A609 Anogenital herpesviral infection, unspecified: Secondary | ICD-10-CM | POA: Diagnosis not present

## 2015-07-28 DIAGNOSIS — G479 Sleep disorder, unspecified: Secondary | ICD-10-CM | POA: Diagnosis not present

## 2015-07-28 DIAGNOSIS — R252 Cramp and spasm: Secondary | ICD-10-CM

## 2015-07-28 DIAGNOSIS — K219 Gastro-esophageal reflux disease without esophagitis: Secondary | ICD-10-CM | POA: Diagnosis not present

## 2015-07-28 DIAGNOSIS — Z23 Encounter for immunization: Secondary | ICD-10-CM

## 2015-07-28 DIAGNOSIS — Z8739 Personal history of other diseases of the musculoskeletal system and connective tissue: Secondary | ICD-10-CM

## 2015-07-28 LAB — CBC WITH DIFFERENTIAL/PLATELET
BASOS PCT: 0 % (ref 0–1)
Basophils Absolute: 0 10*3/uL (ref 0.0–0.1)
EOS ABS: 0.1 10*3/uL (ref 0.0–0.7)
Eosinophils Relative: 2 % (ref 0–5)
HCT: 39.1 % (ref 39.0–52.0)
Hemoglobin: 13.6 g/dL (ref 13.0–17.0)
LYMPHS ABS: 1.3 10*3/uL (ref 0.7–4.0)
Lymphocytes Relative: 26 % (ref 12–46)
MCH: 32.9 pg (ref 26.0–34.0)
MCHC: 34.8 g/dL (ref 30.0–36.0)
MCV: 94.4 fL (ref 78.0–100.0)
MPV: 9.8 fL (ref 8.6–12.4)
Monocytes Absolute: 0.5 10*3/uL (ref 0.1–1.0)
Monocytes Relative: 11 % (ref 3–12)
Neutro Abs: 3 10*3/uL (ref 1.7–7.7)
Neutrophils Relative %: 61 % (ref 43–77)
PLATELETS: 291 10*3/uL (ref 150–400)
RBC: 4.14 MIL/uL — ABNORMAL LOW (ref 4.22–5.81)
RDW: 14.2 % (ref 11.5–15.5)
WBC: 4.9 10*3/uL (ref 4.0–10.5)

## 2015-07-28 LAB — POCT URINALYSIS DIPSTICK
Bilirubin, UA: NEGATIVE
Blood, UA: NEGATIVE
Glucose, UA: NEGATIVE
KETONES UA: NEGATIVE
LEUKOCYTES UA: NEGATIVE
Nitrite, UA: NEGATIVE
PH UA: 7
PROTEIN UA: NEGATIVE
Spec Grav, UA: 1.01
Urobilinogen, UA: NEGATIVE

## 2015-07-28 LAB — COMPREHENSIVE METABOLIC PANEL
ALBUMIN: 4.3 g/dL (ref 3.6–5.1)
ALT: 17 U/L (ref 9–46)
AST: 24 U/L (ref 10–35)
Alkaline Phosphatase: 43 U/L (ref 40–115)
BUN: 14 mg/dL (ref 7–25)
CHLORIDE: 103 mmol/L (ref 98–110)
CO2: 26 mmol/L (ref 20–31)
Calcium: 9.6 mg/dL (ref 8.6–10.3)
Creat: 0.85 mg/dL (ref 0.70–1.33)
Glucose, Bld: 82 mg/dL (ref 65–99)
POTASSIUM: 4.1 mmol/L (ref 3.5–5.3)
Sodium: 138 mmol/L (ref 135–146)
TOTAL PROTEIN: 6.9 g/dL (ref 6.1–8.1)
Total Bilirubin: 0.7 mg/dL (ref 0.2–1.2)

## 2015-07-28 LAB — LIPID PANEL
CHOL/HDL RATIO: 2.7 ratio (ref <=5.0)
CHOLESTEROL: 159 mg/dL (ref 125–200)
HDL: 60 mg/dL (ref >=40)
LDL Cholesterol: 86 mg/dL (ref <130)
TRIGLYCERIDES: 67 mg/dL (ref <150)
VLDL: 13 mg/dL (ref <30)

## 2015-07-28 LAB — URIC ACID: Uric Acid, Serum: 6.6 mg/dL (ref 4.0–7.8)

## 2015-07-28 NOTE — Patient Instructions (Signed)
Try melatonin regularly

## 2015-07-28 NOTE — Progress Notes (Signed)
Subjective:    Patient ID: Matthew [name], male    DOB: 07-Sep-1956, 59 y.o.   MRN: [id number]  HPI He is here for a complete examination. He has had difficulty recently with leg cramping. He has not had any changes in his running regimen or shoes. He keeps himself well hydrated. He usually has only one beer at night. He has also had difficulty with cough. He does have underlying reflux and hypertension. He has tried to determine whether this is related to the blood pressure medication or possibly reflux but so far has been unable to truly identify an issue. He also has a history of gout but has not had trouble with that in quite some time. He does complain of difficulty with sleep stating he can get to sleep usually will wake up several hours later. There've been no new stressors in his life. Medications were reviewed and again no alcohol to excess. He does have a history of neck pain and does see a chiropractor periodically for this. He has a history of herpes and continues on medication.He has had difficulty in the past with iron deficiency. 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 Work and home life are going well. Family and social history as well as health maintenance and immunizations were reviewed.   Review of Systems  All other systems reviewed and are negative.      Objective:   Physical Exam BP 118/80 mmHg  Pulse 76  Ht 6' (1.829 m)  Wt 191 lb (86.637 kg)  BMI 25.90 kg/m2  SpO2 96%  General Appearance:    Alert, cooperative, no distress, appears stated age  Head:    Normocephalic, without obvious abnormality, atraumatic  Eyes:    PERRL, conjunctiva/corneas clear, EOM's intact, fundi    benign  Ears:    Normal TM's and external ear canals  Nose:   Nares normal, mucosa normal, no drainage or sinus   tenderness  Throat:   Lips, mucosa, and tongue normal; teeth and gums normal  Neck:   Supple, no lymphadenopathy;  thyroid:  no    enlargementnderness/nodules; no carotid   bruit or JVD  Back:    Spine nontender, no curvature, ROM normal, no CVA     tenderness  Lungs:     Clear to auscultation bilaterally without wheezes, rales or     ronchi; respirations unlabored  Chest Wall:    No tenderness or deformity   Heart:    Regular rate and rhythm, S1 and S2 normal, no murmur, rub   or gallop  Breast Exam:    No chest wall tenderness, masses or gynecomastia  Abdomen:     Soft, non-tender, nondistended, normoactive bowel sounds,    no masses, no hepatosplenomegaly        Extremities:   No clubbing, cyanosis or edema  Pulses:   2+ and symmetric all extremities  Skin:   Skin color, texture, turgor normal, no rashes or lesions  Lymph nodes:   Cervical, supraclavicular, and axillary nodes normal  Neurologic:   CNII-XII intact, normal strength, sensation and gait; reflexes 2+ and symmetric throughout          Psych:   Normal mood, affect, hygiene and grooming.          Assessment & Plan:  Routine general medical examination at a health care facility - Plan: POCT Urinalysis Dipstick, CBC with Differential/Platelet, Comprehensive metabolic panel, Lipid panel, Uric Acid  Cramps, extremity - Plan:  CBC with Differential/Platelet, Comprehensive metabolic panel  History of gout - Plan: Comprehensive metabolic panel, Uric Acid  Need for prophylactic vaccination and inoculation against influenza - Plan: Flu Vaccine QUAD 36+ mos PF IM (Fluarix & Fluzone Quad PF)  Sleep disturbance  Essential hypertension  Gastroesophageal reflux disease without esophagitis  Allergic rhinitis due to pollen  Herpes genitalis in men  Neck pain discussed the cramping in regard to proper stretching after running as well as keeping himself well-hydrated which he usually does a good job of. Also recommend using melatonin to help with his sleep. Discussed the lisinopril as well as the PPI. He will experiment to see if there is any correlation  between knees and his coughing and let me know. Discussed the herpes with him and the possibility of stopping the medication. He will let me know if he wants to do that.

## 2015-07-29 MED ORDER — LISINOPRIL-HYDROCHLOROTHIAZIDE 10-12.5 MG PO TABS: 1.0000 | ORAL_TABLET | Freq: Every day | ORAL | Status: DC

## 2015-07-29 NOTE — Addendum Note (Signed)
Addended by: Denita Lung on: 07/29/2015 09:10 AM   Modules accepted: Orders

## 2015-08-10 ENCOUNTER — Encounter: Payer: Self-pay | Admitting: Family Medicine

## 2015-08-11 ENCOUNTER — Encounter: Payer: Self-pay | Admitting: *Deleted

## 2015-08-12 ENCOUNTER — Ambulatory Visit (INDEPENDENT_AMBULATORY_CARE_PROVIDER_SITE_OTHER): Payer: BLUE CROSS/BLUE SHIELD | Admitting: Family Medicine

## 2015-08-12 ENCOUNTER — Encounter: Payer: Self-pay | Admitting: Family Medicine

## 2015-08-12 ENCOUNTER — Other Ambulatory Visit: Payer: Self-pay | Admitting: Family Medicine

## 2015-08-12 ENCOUNTER — Ambulatory Visit: Payer: BLUE CROSS/BLUE SHIELD | Admitting: Family Medicine

## 2015-08-12 VITALS — BP 136/84 | HR 64 | Ht 72.0 in | Wt 192.0 lb

## 2015-08-12 DIAGNOSIS — M9908 Segmental and somatic dysfunction of rib cage: Secondary | ICD-10-CM

## 2015-08-12 DIAGNOSIS — R05 Cough: Secondary | ICD-10-CM

## 2015-08-12 DIAGNOSIS — M9901 Segmental and somatic dysfunction of cervical region: Secondary | ICD-10-CM | POA: Diagnosis not present

## 2015-08-12 DIAGNOSIS — M539 Dorsopathy, unspecified: Secondary | ICD-10-CM | POA: Diagnosis not present

## 2015-08-12 DIAGNOSIS — T464X5A Adverse effect of angiotensin-converting-enzyme inhibitors, initial encounter: Principal | ICD-10-CM

## 2015-08-12 DIAGNOSIS — M999 Biomechanical lesion, unspecified: Secondary | ICD-10-CM | POA: Insufficient documentation

## 2015-08-12 DIAGNOSIS — R29898 Other symptoms and signs involving the musculoskeletal system: Secondary | ICD-10-CM | POA: Insufficient documentation

## 2015-08-12 DIAGNOSIS — M9902 Segmental and somatic dysfunction of thoracic region: Secondary | ICD-10-CM

## 2015-08-12 DIAGNOSIS — I10 Essential (primary) hypertension: Secondary | ICD-10-CM

## 2015-08-12 MED ORDER — LOSARTAN POTASSIUM-HCTZ 50-12.5 MG PO TABS: 1.0000 | ORAL_TABLET | Freq: Every day | ORAL | Status: DC

## 2015-08-12 MED ORDER — TIZANIDINE HCL 4 MG PO TABS: 4.0000 mg | ORAL_TABLET | Freq: Every evening | ORAL | Status: DC

## 2015-08-12 NOTE — Progress Notes (Signed)
Pre visit review using our clinic review tool, if applicable. No additional management support is needed unless otherwise documented below in the visit note. 

## 2015-08-12 NOTE — Progress Notes (Signed)
  [name] [place],  [address] Phone: [phone] Subjective:    CC: Back pain follow up neck pain.    CBU:LAGTXMIWOE Matthew [name] is a 59 y.o. male coming in with complaint of back pain.  Patient states the neck seems to be worse in the back of this time. Patient states she is doing more of a tightness. Patient states that it seems to go into the shoulders bilaterally. No radiation down the arms and no weakness. States that he notices him having to shift position multiple times throughout the day. Patient denies any weakness of the upper 20s. Patient states that it is very difficult to get comfortable at night. Patient has had difficulty sleeping. Mild increase in stress as well as could be contributing to it. Doesn't stop  him from activities but is becoming frustrating. Has not tried any home modalities.     Past medical history, social, surgical and family history all reviewed in electronic medical record.   Review of Systems: No headache, visual changes, nausea, vomiting, diarrhea, constipation, dizziness, abdominal pain, skin rash, fevers, chills, night sweats, weight loss, swollen lymph nodes, body aches, joint swelling, muscle aches, chest pain, shortness of breath, mood changes.   Objective Blood pressure 136/84, pulse 64, height 6' (1.829 m), weight 192 lb (87.091 kg), SpO2 98 %.  General: No apparent distress alert and oriented x3 mood and affect normal, dressed appropriately.  HEENT: Pupils equal, extraocular movements intact  Respiratory: Patient's speak in full sentences and does not appear short of breath  Cardiovascular: No lower extremity edema, non tender, no erythema  Skin: Warm dry intact with no signs of infection or rash on extremities or on axial skeleton.  Abdomen: Soft nontender  Neuro: Cranial nerves II through XII are intact, neurovascularly intact in all extremities with 2+ DTRs and 2+ pulses.  Lymph: No  lymphadenopathy of posterior or anterior cervical chain or axillae bilaterally.  Gait mild antalgic gait MSK:  Non tender with full range of motion and good stability and symmetric strength and tone of shoulders, elbows, wrist, hip, knee and ankles bilaterally.  Neck: Inspection unremarkable. No palpable stepoffs. Negative Spurling's maneuver. Mild limitation in range of motion with rotation to the right and left as well as extension.. Grip strength and sensation normal in bilateral hands Strength good C4 to T1 distribution No sensory change to C4 to T1 Negative Hoffman sign bilaterally Reflexes normal Back Exam:  Inspection: Unremarkable  Motion: Flexion 35 deg, Extension 25 deg, Side Bending to 35 deg bilaterally,  Rotation to 35 deg bilaterally  SLR laying: Negative  XSLR laying: Negative  Palpable tenderness:  Nontender in the back FABER: negative. Sensory change: Gross sensation intact to all lumbar and sacral dermatomes.  Reflexes: 2+ at both patellar tendons, 2+ at achilles tendons, Babinski's downgoing.  Strength at foot  Plantar-flexion: 5/5 Dorsi-flexion: 5/5 Eversion: 5/5 Inversion: 5/5  Leg strength  Quad: 5/5 Hamstring: 5/5 Hip flexor: 5/5 Hip abductors: 4/5    Osteopathic findings C2 flexed rotated and side bent right C7 flexed rotated and side bent left T1 extended rotated and side bent left with elevated first rib T3 extended rotated and side bent right   Impression and Recommendations:     This case required medical decision making of moderate complexity.

## 2015-08-12 NOTE — Patient Instructions (Addendum)
Good to see you Duexis 1 tab 3 times daily for 6 days zanaflex at night to help with sleep.  Melatonin 5 mg at 6-7 pm.  On wall with heels, butt shoulder and head touching for a goal of 5 minutes daily  At computer try tennisball between shoulder blades See me again in 2-3 weeks.

## 2015-08-12 NOTE — Assessment & Plan Note (Signed)
Patient does have tightness of the next bilaterally. Seems to be more muscular skeletal and truly alignment. There is a possibility for some underlying arthritis but we will not get x-rays at this time. Patient is going to try short course of anti-inflammatory as well as a muscle relaxer at night. We discussed proper sleep position. Discussed postural changes throughout the day and ergonomics at work. Patient will try to make these changes and come back in 2-3 weeks. Continued have pain imaging will be warranted. Will also go with strengthening of the scapular region.

## 2015-08-12 NOTE — Assessment & Plan Note (Signed)
Decision today to treat with OMT was based on Physical Exam  After verbal consent patient was treated with HVLA, ME, FPR techniques in cervical, thoracic and rib areas  Patient tolerated the procedure well with improvement in symptoms  Patient given exercises, stretches and lifestyle modifications  See medications in patient instructions if given  Patient will follow up in 2-3 weeks

## 2015-11-12 ENCOUNTER — Encounter: Payer: Self-pay | Admitting: Family Medicine

## 2016-01-03 ENCOUNTER — Other Ambulatory Visit: Payer: Self-pay | Admitting: Family Medicine

## 2016-01-26 ENCOUNTER — Encounter: Payer: Self-pay | Admitting: Family Medicine

## 2016-01-27 ENCOUNTER — Ambulatory Visit (INDEPENDENT_AMBULATORY_CARE_PROVIDER_SITE_OTHER): Payer: BLUE CROSS/BLUE SHIELD | Admitting: Family Medicine

## 2016-01-27 ENCOUNTER — Encounter: Payer: Self-pay | Admitting: Family Medicine

## 2016-01-27 VITALS — BP 124/84 | HR 71 | Ht 72.0 in | Wt 197.0 lb

## 2016-01-27 DIAGNOSIS — M869 Osteomyelitis, unspecified: Secondary | ICD-10-CM | POA: Diagnosis not present

## 2016-01-27 DIAGNOSIS — M25562 Pain in left knee: Secondary | ICD-10-CM | POA: Insufficient documentation

## 2016-01-27 HISTORY — DX: Osteomyelitis, unspecified: M86.9

## 2016-01-27 NOTE — Patient Instructions (Signed)
Good to see you  Ice is your friend No squats-  Start leg press then squat on machine then smith machine askling exercises on yuo tube  Vitamin D 4000 IU daily for 2 weeks then 2000 IU daily  Send message in 2-3 weeks.

## 2016-01-27 NOTE — Progress Notes (Signed)
[name] [place], [place] [address] Phone: [phone] Subjective:     CC: medial knee pain.   [id number] Matthew [name] is a 60 y.o. male coming in with complaint of medial knee pain. Has history of microfracture as well as meniscectomy. Has been seen before. States that as long as he wears the brace she seems to do better. Denies any instability but states twisting seems to be worse recently. And is prone greater than Neer ago and it seemed to resolve on its own. Was doing much worse when he made the appointment 2 days ago but now seems to be doing better. Working on a fairly regular basis.  Patient is also coming in with complaint of bilateral groin pain. Denies any type injury. Has been doing a lot more yoga as well as running. Patient has been doing some high impact exercises. Patient states that seems to be more of a dull aching sensation with very minimal radiation. No back pain with it. Denies any fevers chills or any abnormal weight loss.     Past Medical History  Diagnosis Date  . Colonic polyp 09/2006    colonoscopy  . Hypertension   . Allergy     RHINITIS   Past Surgical History  Procedure Laterality Date  . Colonoscopy  09/2006  . Lumbar disc surgery  1990   Social History  Substance Use Topics  . Smoking status: Current Some Day Smoker    Types: Cigars  . Smokeless tobacco: Never Used  . Alcohol Use: 1.5 oz/week    3 drink(s) per week   Allergies  Allergen Reactions  . Demerol    No family history on file. No known family history of rheumatological diseases.   Past medical history, social, surgical and family history all reviewed in electronic medical record.   Review of Systems: No headache, visual changes, nausea, vomiting, diarrhea, constipation, dizziness, abdominal pain, skin rash, fevers, chills, night sweats, weight loss, swollen lymph nodes, body aches, joint swelling, muscle aches, chest pain,  shortness of breath, mood changes.   Objective Blood pressure 124/84, pulse 71, height 6' (1.829 m), weight 197 lb (89.359 kg), SpO2 98 %.  General: No apparent distress alert and oriented x3 mood and affect normal, dressed appropriately.  HEENT: Pupils equal, extraocular movements intact  Respiratory: Patient's speak in full sentences and does not appear short of breath  Cardiovascular: No lower extremity edema, non tender, no erythema  Skin: Warm dry intact with no signs of infection or rash on extremities or on axial skeleton.  Abdomen: Soft nontender  Neuro: Cranial nerves II through XII are intact, neurovascularly intact in all extremities with 2+ DTRs and 2+ pulses.  Lymph: No lymphadenopathy of posterior or anterior cervical chain or axillae bilaterally.  Gait normal with good balance and coordination.  MSK:  Non tender with full range of motion and good stability and symmetric strength and tone of shoulders, elbows, wrist, , and ankles bilaterally.  Hip exam bilaterally shows the patient does have full range of motion. Mild tightness with external rotation. Full strength of the hip flexors. Patient does have pain though against resisted adduction bilaterally. Severe tenderness over the pubic bone at its symphysis. No crepitus noted. No difficulty with ambulation. Neurovascularly intact distally with deep tendon reflexes intact  Knee: Left Normal to inspection with no erythema or effusion or obvious bony abnormalities. Palpation normal with no warmth, joint line tenderness, patellar tenderness, or condyle tenderness. ROM  full in flexion and extension and lower leg rotation. Ligaments with solid consistent endpoints including ACL, PCL, LCL, MCL. Positive Mcmurray's, Apley's, and Thessalonian tests. Non painful patellar compression. Patellar glide without crepitus. Patellar and quadriceps tendons unremarkable. Hamstring and quadriceps strength is normal.  Contralateral knee  unremarkable No significant change in previous exam.     Impression and Recommendations:     This case required medical decision making of moderate complexity.

## 2016-01-27 NOTE — Assessment & Plan Note (Signed)
We will continue to monitor. I do believe the patient did have some mild tightness of the hamstrings and we will go after the tightness of this. We discussed continuing to wear the brace as well as it is helpful. Patient will come back and see me again in 3-4 weeks.

## 2016-01-27 NOTE — Assessment & Plan Note (Signed)
This exam today I do think that this is the most likely diagnosis. Patient does not have any significant decrease in range of motion of the hips. Patient has had no injury otherwise. Severely tender over the symphysis. We discussed different treatment options. Patient is going to take anti-inflammatory 3 times daily for the next 6 days. We discussed icing. Discussed which activities to avoid. Patient is any significant pain that does not seem to be improving come back in 3 weeks and we'll either consider prednisone burst possible injection. At that time pelvic imaging would be necessary as well.

## 2016-01-27 NOTE — Progress Notes (Signed)
Pre visit review using our clinic review tool, if applicable. No additional management support is needed unless otherwise documented below in the visit note. 

## 2016-02-15 ENCOUNTER — Other Ambulatory Visit: Payer: Self-pay | Admitting: Family Medicine

## 2016-05-15 ENCOUNTER — Other Ambulatory Visit: Payer: Self-pay | Admitting: Family Medicine

## 2016-06-05 ENCOUNTER — Encounter: Payer: Self-pay | Admitting: Family Medicine

## 2016-07-25 ENCOUNTER — Encounter: Payer: Self-pay | Admitting: Family Medicine

## 2016-07-25 NOTE — Progress Notes (Signed)
[name] [address] [place], [place] [address] Phone: [phone] Subjective:     CC: medial knee pain.   [id number]  Matthew [name] is a 60 y.o. male coming in with complaint of medial knee pain. Has history of microfracture as well as meniscectomy. Patient has had this pain previously. Seems to be on the left sign. An states the pain seems to be worsening and increasing frequency as well as duration.  Patient states It as a dull throbbing aching sensation. States that he had some difficulty walking for proximal main 2 days and then it seemed to improve. Patient denies any numbness. Rates the severity of pain as 6 out of 10. States that today though it as 2 out of 10 and seems to be improving. Did not play golf at the last couple days.     Past Medical History:  Diagnosis Date  . Allergy    RHINITIS  . Colonic polyp 09/2006   colonoscopy  . Hypertension    Past Surgical History:  Procedure Laterality Date  . COLONOSCOPY  09/2006  . [place] SURGERY  1990   Social History  Substance Use Topics  . Smoking status: Current Some Day Smoker    Types: Cigars  . Smokeless tobacco: Never Used  . Alcohol use 1.5 oz/week    3 drink(s) per week   Allergies  Allergen Reactions  . Demerol    No family history on file. No known family history of rheumatological diseases.   Past medical history, social, surgical and family history all reviewed in electronic medical record.   Review of Systems: No headache, visual changes, nausea, vomiting, diarrhea, constipation, dizziness, abdominal pain, skin rash, fevers, chills, night sweats, weight loss, swollen lymph nodes, body aches, joint swelling, muscle aches, chest pain, shortness of breath, mood changes.   Objective  Blood pressure 130/86, pulse 65, SpO2 97 %.  General: No apparent distress alert and oriented x3 mood and affect normal, dressed appropriately.  HEENT: Pupils equal, extraocular  movements intact  Respiratory: Patient's speak in full sentences and does not appear short of breath  Cardiovascular: No lower extremity edema, non tender, no erythema  Skin: Warm dry intact with no signs of infection or rash on extremities or on axial skeleton.  Abdomen: Soft nontender  Neuro: Cranial nerves II through XII are intact, neurovascularly intact in all extremities with 2+ DTRs and 2+ pulses.  Lymph: No lymphadenopathy of posterior or anterior cervical chain or axillae bilaterally.  Gait normal with good balance and coordination.  MSK:  Non tender with full range of motion and good stability and symmetric strength and tone of shoulders, elbows, wrist, , and ankles bilaterally.  Pes planus bilaterally  Knee: Left Normal to inspection with no erythema or effusion or obvious bony abnormalities. Tender to palpation over the medial joint line ROM full in flexion and extension and lower leg rotation. Ligaments with solid consistent endpoints including ACL, PCL, LCL, MCL. Positive Mcmurray's, Apley's, and Thessalonian tests. Non painful patellar compression. Tenderness of the hamstring noted on the left side Patellar glide with mild crepitus. Patellar and quadriceps tendons unremarkable. Hamstring and quadriceps strength is normal.  Contralateral knee unremarkable Worsening pain from previous exam  MSK US performed of: Left knee This study was ordered, performed, and interpreted by [name] D.O.  Knee: All structures visualized. Patient does have chronic meniscal tear of the medial joint space. Mild narrowing noted. Patient does have significant tendinopathy noted. No  abnormality of prepatellar bursa. LCL and MCL unremarkable on long and transverse views. No abnormality of origin of medial or lateral head of the gastrocnemius.  IMPRESSION:  Chronic meniscal tear distal chain strengthening tendinopathy    Impression and Recommendations:     This case required medical  decision making of moderate complexity.

## 2016-07-26 ENCOUNTER — Ambulatory Visit (INDEPENDENT_AMBULATORY_CARE_PROVIDER_SITE_OTHER): Payer: BLUE CROSS/BLUE SHIELD | Admitting: Family Medicine

## 2016-07-26 ENCOUNTER — Encounter: Payer: Self-pay | Admitting: Family Medicine

## 2016-07-26 ENCOUNTER — Other Ambulatory Visit: Payer: Self-pay

## 2016-07-26 VITALS — BP 130/86 | HR 65

## 2016-07-26 DIAGNOSIS — M25562 Pain in left knee: Secondary | ICD-10-CM

## 2016-07-26 DIAGNOSIS — M76892 Other specified enthesopathies of left lower limb, excluding foot: Secondary | ICD-10-CM | POA: Insufficient documentation

## 2016-07-26 DIAGNOSIS — M2142 Flat foot [pes planus] (acquired), left foot: Secondary | ICD-10-CM | POA: Diagnosis not present

## 2016-07-26 DIAGNOSIS — Z23 Encounter for immunization: Secondary | ICD-10-CM | POA: Diagnosis not present

## 2016-07-26 DIAGNOSIS — M2141 Flat foot [pes planus] (acquired), right foot: Secondary | ICD-10-CM

## 2016-07-26 DIAGNOSIS — M109 Gout, unspecified: Secondary | ICD-10-CM | POA: Insufficient documentation

## 2016-07-26 MED ORDER — ALLOPURINOL 100 MG PO TABS: 200.0000 mg | ORAL_TABLET | Freq: Every day | ORAL | 6 refills | Status: DC

## 2016-07-26 MED ORDER — VITAMIN D (ERGOCALCIFEROL) 1.25 MG (50000 UNIT) PO CAPS: 50000.0000 [IU] | ORAL_CAPSULE | ORAL | 0 refills | Status: DC

## 2016-07-26 NOTE — Patient Instructions (Signed)
Good to see you  Stop daily vitamin D Start once weekly for 12 weeks.  Allopurinol 200mg  daily  Continue the vitamins Ice is your friend Gatorade through the day a little Spenco orthotics "total support" online would be great  See me again in 4-6 weeks.

## 2016-07-26 NOTE — Assessment & Plan Note (Signed)
Encouraged patient to get over-the-counter orthotics. We discussed this may be beneficial to some of his back pain as well. Follow-up in 4-6 weeks.

## 2016-07-26 NOTE — Assessment & Plan Note (Signed)
Patient likely had a partial meniscectomy that seems to now has had some chronic changes. Possible calcific deposits first possible uric acid deposits. Patient will be started on allopurinol and once weekly vitamin D. Encourage him to do more stretching. We discussed compression sleeve with activity. We discussed over-the-counter orthotics to help with the pes planus. Icing regimen and continuing to be active. Follow-up again in 4-6 weeks.

## 2016-08-12 ENCOUNTER — Encounter: Payer: Self-pay | Admitting: Family Medicine

## 2016-08-17 ENCOUNTER — Telehealth: Payer: Self-pay

## 2016-08-17 NOTE — Telephone Encounter (Signed)
Request refill of Losartan- HCTZ 90 day supply to [facility]

## 2016-08-18 ENCOUNTER — Other Ambulatory Visit: Payer: Self-pay

## 2016-08-18 MED ORDER — LOSARTAN POTASSIUM-HCTZ 50-12.5 MG PO TABS: 1.0000 | ORAL_TABLET | Freq: Every day | ORAL | 3 refills | Status: DC

## 2016-08-18 NOTE — Telephone Encounter (Signed)
Done

## 2016-10-10 ENCOUNTER — Encounter: Payer: Self-pay | Admitting: Family Medicine

## 2016-10-11 ENCOUNTER — Encounter: Payer: Self-pay | Admitting: Family Medicine

## 2016-10-12 ENCOUNTER — Other Ambulatory Visit (INDEPENDENT_AMBULATORY_CARE_PROVIDER_SITE_OTHER): Payer: BLUE CROSS/BLUE SHIELD

## 2016-10-12 ENCOUNTER — Other Ambulatory Visit: Payer: Self-pay | Admitting: Family Medicine

## 2016-10-12 DIAGNOSIS — R5383 Other fatigue: Secondary | ICD-10-CM

## 2016-10-12 LAB — COMPREHENSIVE METABOLIC PANEL
ALK PHOS: 50 U/L (ref 39–117)
ALT: 21 U/L (ref 0–53)
AST: 20 U/L (ref 0–37)
Albumin: 4.9 g/dL (ref 3.5–5.2)
BUN: 15 mg/dL (ref 6–23)
CHLORIDE: 102 meq/L (ref 96–112)
CO2: 29 mEq/L (ref 19–32)
Calcium: 10 mg/dL (ref 8.4–10.5)
Creatinine, Ser: 0.95 mg/dL (ref 0.40–1.50)
GFR: 85.72 mL/min (ref >60.00)
Glucose, Bld: 96 mg/dL (ref 70–99)
POTASSIUM: 3.9 meq/L (ref 3.5–5.1)
Sodium: 140 mEq/L (ref 135–145)
Total Bilirubin: 0.7 mg/dL (ref 0.2–1.2)
Total Protein: 7.7 g/dL (ref 6.0–8.3)

## 2016-10-12 LAB — CBC WITH DIFFERENTIAL/PLATELET
BASOS PCT: 0.3 % (ref 0.0–3.0)
Basophils Absolute: 0 10*3/uL (ref 0.0–0.1)
EOS PCT: 4.2 % (ref 0.0–5.0)
Eosinophils Absolute: 0.3 10*3/uL (ref 0.0–0.7)
HEMATOCRIT: 43.2 % (ref 39.0–52.0)
HEMOGLOBIN: 14.8 g/dL (ref 13.0–17.0)
Lymphocytes Relative: 23 % (ref 12.0–46.0)
Lymphs Abs: 1.4 10*3/uL (ref 0.7–4.0)
MCHC: 34.3 g/dL (ref 30.0–36.0)
MCV: 94.1 fl (ref 78.0–100.0)
MONOS PCT: 13.4 % — AB (ref 3.0–12.0)
Monocytes Absolute: 0.8 10*3/uL (ref 0.1–1.0)
Neutro Abs: 3.7 10*3/uL (ref 1.4–7.7)
Neutrophils Relative %: 59.1 % (ref 43.0–77.0)
Platelets: 300 10*3/uL (ref 150.0–400.0)
RBC: 4.59 Mil/uL (ref 4.22–5.81)
RDW: 12.5 % (ref 11.5–15.5)
WBC: 6.3 10*3/uL (ref 4.0–10.5)

## 2016-10-12 LAB — TSH: TSH: 2.15 u[IU]/mL (ref 0.35–4.50)

## 2016-10-12 LAB — IBC PANEL
IRON: 137 ug/dL (ref 42–165)
SATURATION RATIOS: 32.2 % (ref 20.0–50.0)
TRANSFERRIN: 304 mg/dL (ref 212.0–360.0)

## 2016-10-12 LAB — VITAMIN D 25 HYDROXY (VIT D DEFICIENCY, FRACTURES): VITD: 35.95 ng/mL (ref 30.00–100.00)

## 2016-10-12 LAB — FERRITIN: Ferritin: 82.8 ng/mL (ref 22.0–322.0)

## 2016-10-12 LAB — SEDIMENTATION RATE: SED RATE: 5 mm/h (ref 0–20)

## 2016-10-13 NOTE — Progress Notes (Signed)
[name] [place] [place], [place] [address] Phone: [phone] Subjective:    I'm seeing this patient by the request  of:  Wyatt Haste, MD   CC: right shoulder pain   [id number]  Matthew [name] is a 60 y.o. male coming in with complaint of right shoulder pain.   Patient states that this is a new problem. Has been getting worse over the course of time. Certain movements has a sharp pain that seems to be on the anterior lateral aspect of the shoulder. Patient denies any true injury. Continues to try to stay active. Denies any radiation towards the elbow. Patient denies any associated neck pain. Does hurt when he lays on that side. Rates the severity of pain as 5 out of 10.  Has been having some cramping in his legs mostly at night. Patient states it does not seem to happen during the day. Can be in his hamstrings quadriceps or even his calves. Has had difficult he with her previously but has been doing well. Patient to have it checked again and labs were unremarkable.     Past Medical History:  Diagnosis Date  . Allergy    RHINITIS  . Colonic polyp 09/2006   colonoscopy  . Hypertension    Past Surgical History:  Procedure Laterality Date  . COLONOSCOPY  09/2006  . [place] SURGERY  1990   Social History   Social History  . Marital status: Married    Spouse name: N/A  . Number of children: N/A  . Years of education: N/A   Social History Main Topics  . Smoking status: Current Some Day Smoker    Types: Cigars  . Smokeless tobacco: Never Used  . Alcohol use 1.5 oz/week    3 drink(s) per week  . Drug use: No  . Sexual activity: Yes   Other Topics Concern  . None   Social History Narrative  . None   Allergies  Allergen Reactions  . Demerol    No family history on file.no family history of rheumatological diseases  Past medical history, social, surgical and family history all reviewed in electronic medical  record.  No pertanent information unless stated regarding to the chief complaint.   Review of Systems:Review of systems updated and as accurate as of 10/15/16  No headache, visual changes, nausea, vomiting, diarrhea, constipation, dizziness, abdominal pain, skin rash, fevers, chills, night sweats, weight loss, swollen lymph nodes, body aches, joint swelling, muscle aches, chest pain, shortness of breath, mood changes.   Objective  Blood pressure 122/80, pulse (!) 58, height 6' (1.829 m), weight 204 lb (92.5 kg), SpO2 98 %. Systems examined below as of 10/15/16   General: No apparent distress alert and oriented x3 mood and affect normal, dressed appropriately.  HEENT: Pupils equal, extraocular movements intact  Respiratory: Patient's speak in full sentences and does not appear short of breath  Cardiovascular: No lower extremity edema, non tender, no erythema  Skin: Warm dry intact with no signs of infection or rash on extremities or on axial skeleton.  Abdomen: Soft nontender  Neuro: Cranial nerves II through XII are intact, neurovascularly intact in all extremities with 2+ DTRs and 2+ pulses.  Lymph: No lymphadenopathy of posterior or anterior cervical chain or axillae bilaterally.  Gait normal with good balance and coordination.  MSK:  Non tender with full range of motion and good stability and symmetric strength and tone of  elbows, wrist, hip, knee and ankles  bilaterally.  Shoulder: Right Inspection reveals no abnormalities, atrophy or asymmetry. Palpation is normal with no tenderness over AC joint or bicipital groove. ROM is full in all planes passively. Rotator cuff strength normal throughout. signs of impingement with positive Neer and Hawkin's tests, but negative empty can sign. Speeds and Yergason's tests positive. No labral pathology noted with negative Obrien's, negative clunk and good stability. Normal scapular function observed. No painful arc and no drop arm sign. No  apprehension sign  MSK US performed of: right shoulder This study was ordered, performed, and interpreted by [name] D.O.  Shoulder:   Supraspinatus: mild degenerative change but no true tear appreciated Infraspinatus:  Appears normal on long and transverse views. Subscapularis: degenerative changes noted Teres Minor:  Appears normal on long and transverse views. AC joint: mild arthritis Glenohumeral Joint:  Appears normal without effusion. Glenoid Labrum:  posterior aspect visualized with no true tear Biceps Tendon: patient does have significant hypoechoic changes of the bicep tendon.subluxation occurring No increased power doppler signal. Impression:biceps sublux with mild degenerative changes of the rotator cuff  Procedure note 97110; 15 minutes spent for Therapeutic exercises as stated in above notes.  This included exercises focusing on stretching, strengthening, with significant focus on eccentric aspects.   Shoulder Exercises that included:  Basic scapular stabilization to include adduction and depression of scapula Scaption, focusing on proper movement and good control Internal and External rotation utilizing a theraband, with elbow tucked at side entire time Rows with theraband  Proper technique shown and discussed handout in great detail with ATC.  All questions were discussed and answered.     Impression and Recommendations:     This case required medical decision making of moderate complexity.      Note: This dictation was prepared with Dragon dictation along with smaller phrase technology. Any transcriptional errors that result from this process are unintentional.

## 2016-10-14 ENCOUNTER — Ambulatory Visit (INDEPENDENT_AMBULATORY_CARE_PROVIDER_SITE_OTHER): Payer: BLUE CROSS/BLUE SHIELD | Admitting: Family Medicine

## 2016-10-14 ENCOUNTER — Encounter: Payer: Self-pay | Admitting: Family Medicine

## 2016-10-14 DIAGNOSIS — S43003A Unspecified subluxation of unspecified shoulder joint, initial encounter: Secondary | ICD-10-CM

## 2016-10-14 DIAGNOSIS — R252 Cramp and spasm: Secondary | ICD-10-CM

## 2016-10-14 DIAGNOSIS — S46119A Strain of muscle, fascia and tendon of long head of biceps, unspecified arm, initial encounter: Secondary | ICD-10-CM | POA: Diagnosis not present

## 2016-10-14 MED ORDER — VITAMIN D (ERGOCALCIFEROL) 1.25 MG (50000 UNIT) PO CAPS: 50000.0000 [IU] | ORAL_CAPSULE | ORAL | 0 refills | Status: DC

## 2016-10-14 MED ORDER — GABAPENTIN 100 MG PO CAPS: 200.0000 mg | ORAL_CAPSULE | Freq: Every day | ORAL | 3 refills | Status: DC

## 2016-10-14 NOTE — Patient Instructions (Addendum)
Good to see you Add vitamin D1000 IU daily to the weekly  Gabapentin 200mg  at night  For the shoulder Exercises 3 times a week.  pennsaid pinkie amount topically 2 times daily as needed.  Ice before bed Keep hands within peripheral vision  Continue the iron  See me when you need me.  Happy holidays!

## 2016-10-15 DIAGNOSIS — S46119A Strain of muscle, fascia and tendon of long head of biceps, unspecified arm, initial encounter: Secondary | ICD-10-CM | POA: Insufficient documentation

## 2016-10-15 DIAGNOSIS — G4762 Sleep related leg cramps: Secondary | ICD-10-CM | POA: Insufficient documentation

## 2016-10-15 DIAGNOSIS — R252 Cramp and spasm: Secondary | ICD-10-CM | POA: Insufficient documentation

## 2016-10-15 DIAGNOSIS — S43003A Unspecified subluxation of unspecified shoulder joint, initial encounter: Secondary | ICD-10-CM

## 2016-10-15 HISTORY — DX: Strain of muscle, fascia and tendon of long head of biceps, unspecified arm, initial encounter: S46.119A

## 2016-10-15 HISTORY — DX: Unspecified subluxation of unspecified shoulder joint, initial encounter: S43.003A

## 2016-10-15 NOTE — Assessment & Plan Note (Signed)
Patient does have more of a biceps subluxation. Patient is going to compression, home exercises, icing protocol. We discussed over-the-counter medications. Patient will continue to be active otherwise. Follow-up again with me in 4 weeks.

## 2016-10-15 NOTE — Assessment & Plan Note (Signed)
Discussed with patient in great length. We discussed icing regimen and home exercises. We discussed what other things patient may do before bed. Patient will try to make these changes and will start on gabapentin. History of a lumbar surgery and we discussed that this could be coming from his back. Follow-up again in 4 weeks. Worsening symptoms to seek medical attention immediately.

## 2016-11-10 ENCOUNTER — Encounter: Payer: Self-pay | Admitting: Family Medicine

## 2016-11-11 ENCOUNTER — Encounter: Payer: Self-pay | Admitting: Family Medicine

## 2016-11-11 ENCOUNTER — Ambulatory Visit (INDEPENDENT_AMBULATORY_CARE_PROVIDER_SITE_OTHER): Payer: BLUE CROSS/BLUE SHIELD | Admitting: Family Medicine

## 2016-11-11 ENCOUNTER — Ambulatory Visit: Payer: Self-pay

## 2016-11-11 VITALS — BP 136/84 | HR 66

## 2016-11-11 DIAGNOSIS — M79672 Pain in left foot: Secondary | ICD-10-CM

## 2016-11-11 DIAGNOSIS — M71572 Other bursitis, not elsewhere classified, left ankle and foot: Secondary | ICD-10-CM

## 2016-11-11 DIAGNOSIS — M7752 Other enthesopathy of left foot: Secondary | ICD-10-CM

## 2016-11-11 HISTORY — DX: Other enthesopathy of left foot and ankle: M77.52

## 2016-11-11 MED ORDER — DOXYCYCLINE HYCLATE 100 MG PO TABS: 100.0000 mg | ORAL_TABLET | Freq: Two times a day (BID) | ORAL | 0 refills | Status: AC

## 2016-11-11 NOTE — Assessment & Plan Note (Signed)
Bursitis noted and likely rupture.  Discussed proper shoes, ice, elevation Abx sent in in case of infection Potential uric acid as well.  RTC  In 2 weeks if not resolved.

## 2016-11-11 NOTE — Progress Notes (Signed)
[name] [address] [place], [place] [address] Phone: [phone] Subjective:    I'm seeing this patient by the request  of:    CC: Left foot pain  [id number]  Matthew [name] is a 61 y.o. male coming in with complaint of left foot pain. Patient states that this started last night. Severe amount of pain. Was unable to walk. Seem to be on the plantar aspect of the foot. Does not rib or injury. Did not step on anything. Patient states at night and had more of a throbbing sensation. Rates the severity pain is 8 out of 10. Worse with walking better with rest.     Past Medical History:  Diagnosis Date  . Allergy    RHINITIS  . Colonic polyp 09/2006   colonoscopy  . Hypertension    Past Surgical History:  Procedure Laterality Date  . COLONOSCOPY  09/2006  . [place] SURGERY  1990   Social History   Social History  . Marital status: Married    Spouse name: N/A  . Number of children: N/A  . Years of education: N/A   Social History Main Topics  . Smoking status: Current Some Day Smoker    Types: Cigars  . Smokeless tobacco: Never Used  . Alcohol use 1.5 oz/week    3 drink(s) per week  . Drug use: No  . Sexual activity: Yes   Other Topics Concern  . None   Social History Narrative  . None   Allergies  Allergen Reactions  . Demerol    No family history on file.  Past medical history, social, surgical and family history all reviewed in electronic medical record.  No pertanent information unless stated regarding to the chief complaint.   Review of Systems:Review of systems updated and as accurate as of 11/11/16  No headache, visual changes, nausea, vomiting, diarrhea, constipation, dizziness, abdominal pain, skin rash, fevers, chills, night sweats, weight loss, swollen lymph nodes, body aches, joint swelling, muscle aches, chest pain, shortness of breath, mood changes.   Objective  Blood pressure 136/84, pulse 66, SpO2 97  %. Systems examined below as of 11/11/16   General: No apparent distress alert and oriented x3 mood and affect normal, dressed appropriately.  HEENT: Pupils equal, extraocular movements intact  Respiratory: Patient's speak in full sentences and does not appear short of breath  Cardiovascular: No lower extremity edema, non tender, no erythema  Skin: Warm dry intact with no signs of infection or rash on extremities or on axial skeleton.  Abdomen: Soft nontender  Neuro: Cranial nerves II through XII are intact, neurovascularly intact in all extremities with 2+ DTRs and 2+ pulses.  Lymph: No lymphadenopathy of posterior or anterior cervical chain or axillae bilaterally.  Gait normal with good balance and coordination.  MSK:  Non tender with full range of motion and good stability and symmetric strength and tone of shoulders, elbows, wrist, hip, knee and ankles bilaterally.  Foot exam shows the patient does have more swelling on the plantar aspect of the foot. Soreness to palpation just proximal to the heads of the metatarsals over The second and third. Seems to be more soft tissue than over the bone itself. No pain over the dorsal aspect of the foot. No numbness. Neurovascularly intact distally. Full range of motion of the ankle.  Limited muscular skeletal ultrasound was performed and interpreted by Lyndal Pulley  Limited ultrasound patient's plantar aspect of the wound shows the patient  does have significant soft tissue swelling. No cortical defect appears to potentially have a bursa sac that may have ruptured. Impression possible plantar bursitis and had potentially ruptured causing soft tissue inflammation.      Impression and Recommendations:     This case required medical decision making of moderate complexity.      Note: This dictation was prepared with Dragon dictation along with smaller phrase technology. Any transcriptional errors that result from this process are unintentional.

## 2016-11-12 ENCOUNTER — Other Ambulatory Visit: Payer: Self-pay | Admitting: Family Medicine

## 2016-11-14 NOTE — Telephone Encounter (Signed)
Is this okay to refill? 

## 2016-12-07 ENCOUNTER — Ambulatory Visit (INDEPENDENT_AMBULATORY_CARE_PROVIDER_SITE_OTHER)
Admission: RE | Admit: 2016-12-07 | Discharge: 2016-12-07 | Disposition: A | Discharge status: Home / Self Care | Payer: BLUE CROSS/BLUE SHIELD | Source: Ambulatory Visit | Attending: Family Medicine | Admitting: Family Medicine

## 2016-12-07 ENCOUNTER — Encounter: Payer: Self-pay | Admitting: Family Medicine

## 2016-12-07 DIAGNOSIS — R52 Pain, unspecified: Secondary | ICD-10-CM

## 2016-12-07 NOTE — Progress Notes (Deleted)
[name] [place], [place] [address] Phone: [phone] Subjective:    I'm seeing this patient by the request  of:    CC: Right arm pain, left knee pain  [id number]  Matthew [name] is a 61 y.o. male coming in with complaint of  Right arm pain. Has been known to have subluxation of the bicep tendon. Patient was given home exercises, compression, overweight certain activities. Patient states  Patient is also had some left knee pain. Was having worsening symptoms. Possibility of having gout Changes of the knee. Patient was started on out. All. No significant improvement. Patient discusses pain as more of a dull, throbbing aching sensation.  X-rays the patient's right shoulder show mild glenohumeral joint arthritis. X-rays the patient's left knee shows mild arthritic changes as well as CPPD these were independently visualized by me. Taking 12/07/2016     Past Medical History:  Diagnosis Date  . Allergy    RHINITIS  . Colonic polyp 09/2006   colonoscopy  . Hypertension    Past Surgical History:  Procedure Laterality Date  . COLONOSCOPY  09/2006  . [place] SURGERY  1990   Social History   Social History  . Marital status: Married    Spouse name: N/A  . Number of children: N/A  . Years of education: N/A   Social History Main Topics  . Smoking status: Current Some Day Smoker    Types: Cigars  . Smokeless tobacco: Never Used  . Alcohol use 1.5 oz/week    3 drink(s) per week  . Drug use: No  . Sexual activity: Yes   Other Topics Concern  . Not on file   Social History Narrative  . No narrative on file   Allergies  Allergen Reactions  . Demerol    No family history on file.  Past medical history, social, surgical and family history all reviewed in electronic medical record.  No pertanent information unless stated regarding to the chief complaint.   Review of Systems:Review of systems updated and as accurate  as of 12/07/16  No headache, visual changes, nausea, vomiting, diarrhea, constipation, dizziness, abdominal pain, skin rash, fevers, chills, night sweats, weight loss, swollen lymph nodes, body aches, joint swelling, muscle aches, chest pain, shortness of breath, mood changes.   Objective  There were no vitals taken for this visit. Systems examined below as of 12/07/16   General: No apparent distress alert and oriented x3 mood and affect normal, dressed appropriately.  HEENT: Pupils equal, extraocular movements intact  Respiratory: Patient's speak in full sentences and does not appear short of breath  Cardiovascular: No lower extremity edema, non tender, no erythema  Skin: Warm dry intact with no signs of infection or rash on extremities or on axial skeleton.  Abdomen: Soft nontender  Neuro: Cranial nerves II through XII are intact, neurovascularly intact in all extremities with 2+ DTRs and 2+ pulses.  Lymph: No lymphadenopathy of posterior or anterior cervical chain or axillae bilaterally.  Gait normal with good balance and coordination.  MSK:  Non tender with full range of motion and good stability and symmetric strength and tone of  elbows, wrist, hip, and ankles bilaterally.  Shoulder: Inspection reveals no abnormalities, atrophy or asymmetry. Palpation is normal with no tenderness over AC joint or bicipital groove. ROM is full in all planes. Rotator cuff strength normal throughout. No signs of impingement with negative Neer and Hawkin's tests, empty can sign. Speeds and Yergason's tests  normal. No labral pathology noted with negative Obrien's, negative clunk and good stability. Normal scapular function observed. No painful arc and no drop arm sign. No apprehension sign Contralateral shoulder unremarkable  Knee: Normal to inspection with no erythema or effusion or obvious bony abnormalities. Palpation normal with no warmth, joint line tenderness, patellar tenderness, or condyle  tenderness. ROM full in flexion and extension and lower leg rotation. Ligaments with solid consistent endpoints including ACL, PCL, LCL, MCL. Negative Mcmurray's, Apley's, and Thessalonian tests. Non painful patellar compression. Patellar glide without crepitus. Patellar and quadriceps tendons unremarkable. Hamstring and quadriceps strength is normal.       Impression and Recommendations:     This case required medical decision making of moderate complexity.      Note: This dictation was prepared with Dragon dictation along with smaller phrase technology. Any transcriptional errors that result from this process are unintentional.

## 2016-12-08 ENCOUNTER — Ambulatory Visit: Payer: BLUE CROSS/BLUE SHIELD | Admitting: Family Medicine

## 2016-12-09 ENCOUNTER — Encounter: Payer: Self-pay | Admitting: Family Medicine

## 2016-12-09 ENCOUNTER — Ambulatory Visit: Payer: Self-pay

## 2016-12-09 ENCOUNTER — Ambulatory Visit (INDEPENDENT_AMBULATORY_CARE_PROVIDER_SITE_OTHER): Payer: BLUE CROSS/BLUE SHIELD | Admitting: Family Medicine

## 2016-12-09 VITALS — BP 128/82 | HR 60 | Ht 72.0 in | Wt 206.0 lb

## 2016-12-09 DIAGNOSIS — M109 Gout, unspecified: Secondary | ICD-10-CM | POA: Diagnosis not present

## 2016-12-09 DIAGNOSIS — S83207A Unspecified tear of unspecified meniscus, current injury, left knee, initial encounter: Secondary | ICD-10-CM | POA: Diagnosis not present

## 2016-12-09 DIAGNOSIS — M25511 Pain in right shoulder: Secondary | ICD-10-CM

## 2016-12-09 DIAGNOSIS — S43003A Unspecified subluxation of unspecified shoulder joint, initial encounter: Secondary | ICD-10-CM

## 2016-12-09 DIAGNOSIS — S46119A Strain of muscle, fascia and tendon of long head of biceps, unspecified arm, initial encounter: Secondary | ICD-10-CM | POA: Diagnosis not present

## 2016-12-09 MED ORDER — ALLOPURINOL 300 MG PO TABS: 300.0000 mg | ORAL_TABLET | Freq: Every day | ORAL | 3 refills | Status: DC

## 2016-12-09 NOTE — Assessment & Plan Note (Signed)
Patient given injection. Tolerated the procedure well. Discussed icing regimen and home exercises. Follow-up again in 4-6 weeks.

## 2016-12-09 NOTE — Progress Notes (Signed)
[name] [address] [place], [place] [address] Phone: [phone] Subjective:      CC: Right shoulder pain, left knee pain  [id number]  Matthew [name] is a 61 y.o. male coming in with complaint of 2 problems. Patient was seen previously and was diagnosed with more of a right shoulder problem. Patient did have more of a bicep tendinitis. Seems to be more of a subluxation. Started on home exercises. Was given gabapentin help him with some nighttime relief. Patient continued have pain. We did get x-rays. These were independently visualized by me showing patient having minimal arthritic changes of the acromial clavicular joint and mild arthritic changes of the glenohumeral joint.  Patient also was having left knee pain. States that it is stopping him from activities such as going up and down stairs can have pain with a twisting motion. On ultrasound previously seem to have more of a CPPD versus gout-like changes. Patient did get x-rays. X-rays were and apparently visualized by me. X-ray show degenerative chondrocalcinosis of the menisci and mild osteophytic changes of the patellofemoral joint.      Past Medical History:  Diagnosis Date  . Allergy    RHINITIS  . Colonic polyp 09/2006   colonoscopy  . Hypertension    Past Surgical History:  Procedure Laterality Date  . COLONOSCOPY  09/2006  . [place] SURGERY  1990   Social History   Social History  . Marital status: Married    Spouse name: N/A  . Number of children: N/A  . Years of education: N/A   Social History Main Topics  . Smoking status: Current Some Day Smoker    Types: Cigars  . Smokeless tobacco: Never Used  . Alcohol use 1.5 oz/week    3 drink(s) per week  . Drug use: No  . Sexual activity: Yes   Other Topics Concern  . Not on file   Social History Narrative  . No narrative on file   Allergies  Allergen Reactions  . Demerol    No family history on  file.  Past medical history, social, surgical and family history all reviewed in electronic medical record.  No pertanent information unless stated regarding to the chief complaint.   Review of Systems:Review of systems updated and as accurate as of 12/09/16  No headache, visual changes, nausea, vomiting, diarrhea, constipation, dizziness, abdominal pain, skin rash, fevers, chills, night sweats, weight loss, swollen lymph nodes, body aches, joint swelling, muscle aches, chest pain, shortness of breath, mood changes.   Objective  There were no vitals taken for this visit. Systems examined below as of 12/09/16   General: No apparent distress alert and oriented x3 mood and affect normal, dressed appropriately.  HEENT: Pupils equal, extraocular movements intact  Respiratory: Patient's speak in full sentences and does not appear short of breath  Cardiovascular: No lower extremity edema, non tender, no erythema  Skin: Warm dry intact with no signs of infection or rash on extremities or on axial skeleton.  Abdomen: Soft nontender  Neuro: Cranial nerves II through XII are intact, neurovascularly intact in all extremities with 2+ DTRs and 2+ pulses.  Lymph: No lymphadenopathy of posterior or anterior cervical chain or axillae bilaterally.  Gait normal with good balance and coordination.  MSK:  Non tender with full range of motion and good stability and symmetric strength and tone of  elbows, wrist, hip, and ankles bilaterally.  Shoulder:Right Inspection reveals no abnormalities, atrophy or asymmetry. Palpation  is normal with no tenderness over AC joint or bicipital groove. ROM is full in all planes. Rotator cuff strength normal throughout. Impingement noted. Speeds and Yergason's tests positive. No labral pathology noted with negative Obrien's, negative clunk and good stability. Normal scapular function observed. No painful arc and no drop arm sign. No apprehension sign    Knee:  Left Normal to inspection with no erythema or effusion or obvious bony abnormalities. Tender to palpation over the medial joint line ROM full in flexion and extension and lower leg rotation. Ligaments with solid consistent endpoints including ACL, PCL, LCL, MCL. Positive Mcmurray's, Apley's, and Thessalonian tests. Non painful patellar compression. Patellar glide without crepitus. Patellar and quadriceps tendons unremarkable. Hamstring and quadriceps strength is normal.  Contralateral knee unremarkable  After informed written and verbal consent, patient was seated on exam table. Left knee was prepped with alcohol swab and utilizing anterolateral approach, patient's left knee space was injected with 4:1  marcaine 0.5%: Kenalog 40mg /dL. Patient tolerated the procedure well without immediate complications.  Procedure: Real-time Ultrasound Guided Injection of right glenohumeral joint Device: GE Logiq Q7  Ultrasound guided injection is preferred based studies that show increased duration, increased effect, greater accuracy, decreased procedural pain, increased response rate with ultrasound guided versus blind injection.  Verbal informed consent obtained.  Time-out conducted.  Noted no overlying erythema, induration, or other signs of local infection.  Skin prepped in a sterile fashion.  Local anesthesia: Topical Ethyl chloride.  With sterile technique and under real time ultrasound guidance:  Joint visualized.  23g 1  inch needle inserted posterior approach. Pictures taken for needle placement. Patient did have injection of 2 cc of 1% lidocaine, 2 cc of 0.5% Marcaine, and 1.0 cc of Kenalog 40 mg/dL. Completed without difficulty  Pain immediately resolved suggesting accurate placement of the medication.  Advised to call if fevers/chills, erythema, induration, drainage, or persistent bleeding.  Images permanently stored and available for review in the ultrasound unit.  Impression: Technically  successful ultrasound guided injection.    Impression and Recommendations:     This case required medical decision making of moderate complexity.      Note: This dictation was prepared with Dragon dictation along with smaller phrase technology. Any transcriptional errors that result from this process are unintentional.

## 2016-12-09 NOTE — Assessment & Plan Note (Signed)
Increase allopurionl

## 2016-12-09 NOTE — Patient Instructions (Addendum)
Good to see you  Matthew [name] is your friend.  Increase allopurinol to 300mg  sent in new prescription Watch the diet like we said See em again in w4 weeks but send message in 2 weeks.

## 2016-12-09 NOTE — Assessment & Plan Note (Signed)
Given an injection. Ice discuss. Patient was given a brace that I think will be beneficial. Encourage patient to do the once weekly vitamin D. We will continue to monitor. Increase out. Also secondary to gout.

## 2016-12-25 ENCOUNTER — Other Ambulatory Visit: Payer: Self-pay | Admitting: Family Medicine

## 2016-12-26 ENCOUNTER — Encounter: Payer: Self-pay | Admitting: Family Medicine

## 2016-12-26 NOTE — Telephone Encounter (Signed)
Refill done.  

## 2016-12-27 MED ORDER — GABAPENTIN 300 MG PO CAPS: ORAL_CAPSULE | ORAL | 3 refills | Status: DC

## 2017-03-20 ENCOUNTER — Encounter: Payer: Self-pay | Admitting: Family Medicine

## 2017-03-22 ENCOUNTER — Encounter: Payer: Self-pay | Admitting: Family Medicine

## 2017-03-22 ENCOUNTER — Ambulatory Visit (INDEPENDENT_AMBULATORY_CARE_PROVIDER_SITE_OTHER): Payer: BLUE CROSS/BLUE SHIELD | Admitting: Family Medicine

## 2017-03-22 DIAGNOSIS — M1712 Unilateral primary osteoarthritis, left knee: Secondary | ICD-10-CM

## 2017-03-22 DIAGNOSIS — M109 Gout, unspecified: Secondary | ICD-10-CM

## 2017-03-22 DIAGNOSIS — Z72820 Sleep deprivation: Secondary | ICD-10-CM

## 2017-03-22 MED ORDER — ALLOPURINOL 100 MG PO TABS: 100.0000 mg | ORAL_TABLET | Freq: Every day | ORAL | 6 refills | Status: DC

## 2017-03-22 MED ORDER — HYDROXYZINE HCL 25 MG PO TABS: 25.0000 mg | ORAL_TABLET | Freq: Every evening | ORAL | 3 refills | Status: DC | PRN

## 2017-03-22 MED ORDER — ZOLPIDEM TARTRATE 5 MG PO TABS
5.0000 mg | ORAL_TABLET | Freq: Every evening | ORAL | 1 refills | Status: DC | PRN
Start: 2017-03-22 — End: 2017-11-15

## 2017-03-22 MED ORDER — MELOXICAM 15 MG PO TABS: 15.0000 mg | ORAL_TABLET | Freq: Every day | ORAL | 1 refills | Status: DC

## 2017-03-22 NOTE — Assessment & Plan Note (Signed)
Patient is having some difficulty with sleep. Discussed the possibility of a sleep study. We discussed that patient does have some snoring he states. Patient given hydroxyzine to see if this is more beneficial. Low dose of Ambien also given. Discussed the potential for withdrawal symptoms with this medication. We discussed other sleep hygiene neck be beneficial. Follow-up again in 4 weeks.

## 2017-03-22 NOTE — Patient Instructions (Addendum)
Good to see you as usual.  Meloxicam daily instead of the ibuprofen  Stop the gabapentin  Stop allopurionol at 300 and we will go back to 100mg   Tart cherry extract at night any dose  Hydroxyzine try first with the sleep  If not better then try the other sleep aid.  Injected knee.  See me again in 3-4 weeks and we will start the synvisc injections maybe

## 2017-03-22 NOTE — Assessment & Plan Note (Signed)
Decreased patient's paternal to 100 mg and will try over-the-counter medications.

## 2017-03-22 NOTE — Progress Notes (Signed)
[name] [address] [place], [place] [address] Phone: [phone] Subjective:    I'm seeing this patient by the request  of:    CC: Left knee pain, sleep problems  [id number]  Matthew [name] is a 61 y.o. male coming in with complaint of left knee pain. Past pedicle history significant for arthritic changes of the knee. Patient has had surgery on it previously. Patient states and is having worsening symptoms. Did have injections long time ago. Patient was responding to the upper and elbow feels like it is not helping as much. Has tried to make changes in his diet. Unable to run secondary to the pain. Denies any significant instability.  Having difficulty staying asleep. Patient states that he can follow sleep just fine. Unfortunately then seems to wake up at 3 AM and unable to go back to sleep. Has been going on for months. Has tried someone else's sleeping aid that was beneficial. Wondering if he can get some Ambien.     Past Medical History:  Diagnosis Date  . Allergy    RHINITIS  . Colonic polyp 09/2006   colonoscopy  . Hypertension    Past Surgical History:  Procedure Laterality Date  . COLONOSCOPY  09/2006  . [place] SURGERY  1990   Social History   Social History  . Marital status: Married    Spouse name: N/A  . Number of children: N/A  . Years of education: N/A   Social History Main Topics  . Smoking status: Current Some Day Smoker    Types: Cigars  . Smokeless tobacco: Never Used  . Alcohol use 1.5 oz/week    3 drink(s) per week  . Drug use: No  . Sexual activity: Yes   Other Topics Concern  . Not on file   Social History Narrative  . No narrative on file   Allergies  Allergen Reactions  . Demerol    No family history on file.  Past medical history, social, surgical and family history all reviewed in electronic medical record.  No pertanent information unless stated regarding to the chief complaint.    Review of Systems:Review of systems updated and as accurate as of 03/22/17  No headache, visual changes, nausea, vomiting, diarrhea, constipation, dizziness, abdominal pain, skin rash, fevers, chills, night sweats, weight loss, swollen lymph nodes, body aches, joint swelling, muscle aches, chest pain, shortness of breath, mood changes.   Objective  There were no vitals taken for this visit. Systems examined below as of 03/22/17   General: No apparent distress alert and oriented x3 mood and affect normal, dressed appropriately.  HEENT: Pupils equal, extraocular movements intact  Respiratory: Patient's speak in full sentences and does not appear short of breath  Cardiovascular: No lower extremity edema, non tender, no erythema  Skin: Warm dry intact with no signs of infection or rash on extremities or on axial skeleton.  Abdomen: Soft nontender  Neuro: Cranial nerves II through XII are intact, neurovascularly intact in all extremities with 2+ DTRs and 2+ pulses.  Lymph: No lymphadenopathy of posterior or anterior cervical chain or axillae bilaterally.  Gait normal with good balance and coordination.  MSK:  Non tender with full range of motion and good stability and symmetric strength and tone of shoulders, elbows, wrist, hip, and ankles bilaterally.  Knee: Left Moderate arthritic changes Tender over the medial joint line ROM full in flexion and extension and lower leg rotation. Mild instability with valgus force. Positive  Mcmurray's, Apley's, and Thessalonian tests. Non painful patellar compression. Patellar glide without crepitus. Patellar and quadriceps tendons unremarkable. Hamstring and quadriceps strength is normal.   After informed written and verbal consent, patient was seated on exam table. Left knee was prepped with alcohol swab and utilizing anterolateral approach, patient's left knee space was injected with 4:1  marcaine 0.5%: Kenalog 40mg /dL. Patient tolerated the procedure  well without immediate complications.    Impression and Recommendations:     This case required medical decision making of moderate complexity.      Note: This dictation was prepared with Dragon dictation along with smaller phrase technology. Any transcriptional errors that result from this process are unintentional.

## 2017-03-22 NOTE — Assessment & Plan Note (Signed)
Patient given injection today. Tolerated the procedure well. We discussed icing regimen. Has custom brace. Patient will try to wear this on a more regular basis. Patient will try to make daily changes. Patient come back and see me again in 3-4 weeks. Could be a candidate for viscous supplementation.

## 2017-03-23 ENCOUNTER — Other Ambulatory Visit: Payer: Self-pay | Admitting: Family Medicine

## 2017-04-19 NOTE — Progress Notes (Signed)
[name] [address] [place], [place] [address] Phone: [phone] Subjective:    I'm seeing this patient by the request  of:    CC: Left knee pain, sleep problems  [id number]  Matthew [name] is a 61 y.o. male coming in with complaint of left knee pain. Past medical history significant for arthritic changes of the knee. Patient has had surgery on it previously. Was having worsening symptoms and was given an injection at last exam. Patient was also switched to meloxicam. Stop the gabapentin. Went back to help her in all 100 mg. Started tart Cherry extract. Hydroxyzine to help with sleep. Patient states Doing much better overall. Patient has been running. Not having any severe pain at the time. Very happy with the results.  Having difficulty staying asleep. Started on hydroxyzine. States that the Ambien seems to do better at night. Patient understands and would have to take it nightly. ] Still some neck pain. Describes it as a dull, throbbing aching pain. Worse with rotating her head to the right. Denies any radiation down the arm. Seems to wake him up at about 2 AM.     Past Medical History:  Diagnosis Date  . Allergy    RHINITIS  . Colonic polyp 09/2006   colonoscopy  . Hypertension    Past Surgical History:  Procedure Laterality Date  . COLONOSCOPY  09/2006  . [place] SURGERY  1990   Social History   Social History  . Marital status: Married    Spouse name: N/A  . Number of children: N/A  . Years of education: N/A   Social History Main Topics  . Smoking status: Current Some Day Smoker    Types: Cigars  . Smokeless tobacco: Never Used  . Alcohol use 1.5 oz/week    3 drink(s) per week  . Drug use: No  . Sexual activity: Yes   Other Topics Concern  . None   Social History Narrative  . None   Allergies  Allergen Reactions  . Demerol    No family history on file.  Past medical history, social, surgical and family  history all reviewed in electronic medical record.  No pertanent information unless stated regarding to the chief complaint.   Review of Systems:Review of systems updated and as accurate as of 04/20/17  No headache, visual changes, nausea, vomiting, diarrhea, constipation, dizziness, abdominal pain, skin rash, fevers, chills, night sweats, weight loss, swollen lymph nodes, body aches, joint swelling,  chest pain, shortness of breath, mood changes.  Positive muscle aches  Objective  Blood pressure 130/80, pulse 74, SpO2 98 %.   Systems examined below as of 04/20/17 General: NAD A&O x3 mood, affect normal  HEENT: Pupils equal, extraocular movements intact no nystagmus Respiratory: not short of breath at rest or with speaking Cardiovascular: No lower extremity edema, non tender Skin: Warm dry intact with no signs of infection or rash on extremities or on axial skeleton. Abdomen: Soft nontender, no masses Neuro: Cranial nerves  intact, neurovascularly intact in all extremities with 2+ DTRs and 2+ pulses. Lymph: No lymphadenopathy appreciated today  Gait normal with good balance and coordination.  MSK: Non tender with full range of motion and good stability and symmetric strength and tone of shoulders, elbows, wrist, hips and ankles bilaterally.    Knee: Left Moderate arthritic changes Continue mild tenderness over the medial joint line ROM full in flexion and extension and lower leg rotation. Mild instability with valgus force.  Positive Mcmurray's, Apley's, and Thessalonian tests. Non painful patellar compression. Patellar glide without crepitus. Patellar and quadriceps tendons unremarkable. Hamstring and quadriceps strength is normal.  Contralateral knee unremarkable. Mild improvement from pre-'s exam   Neck: Inspection unremarkable. No palpable stepoffs. Negative Spurling's maneuver. Decreased range of motion lacking the last 5-10 of side bending to the right. Also lacks last 5  of flexion. Grip strength and sensation normal in bilateral hands Strength good C4 to T1 distribution No sensory change to C4 to T1 Negative Hoffman sign bilaterally Reflexes normal  Osteopathic findings C2 flexed rotated and side bent right C4 flexed rotated and side bent left T3 extended rotated and side bent right inhaled third rib T5 extended rotated and side bent left    Impression and Recommendations:     This case required medical decision making of moderate complexity.      Note: This dictation was prepared with Dragon dictation along with smaller phrase technology. Any transcriptional errors that result from this process are unintentional.

## 2017-04-20 ENCOUNTER — Ambulatory Visit (INDEPENDENT_AMBULATORY_CARE_PROVIDER_SITE_OTHER): Payer: BLUE CROSS/BLUE SHIELD | Admitting: Family Medicine

## 2017-04-20 ENCOUNTER — Encounter: Payer: Self-pay | Admitting: Family Medicine

## 2017-04-20 VITALS — BP 130/80 | HR 74

## 2017-04-20 DIAGNOSIS — M999 Biomechanical lesion, unspecified: Secondary | ICD-10-CM | POA: Diagnosis not present

## 2017-04-20 DIAGNOSIS — M1712 Unilateral primary osteoarthritis, left knee: Secondary | ICD-10-CM

## 2017-04-20 NOTE — Assessment & Plan Note (Signed)
Decision today to treat with OMT was based on Physical Exam  After verbal consent patient was treated with HVLA, ME, FPR techniques in cervical, thoracic,areas  Patient tolerated the procedure well with improvement in symptoms  Patient given exercises, stretches and lifestyle modifications  See medications in patient instructions if given  Patient will follow up in 3-4 weeks

## 2017-04-20 NOTE — Assessment & Plan Note (Signed)
Better after injection, continue current therapy.  Patient to be candidate for viscous supplementation if needed.

## 2017-05-04 ENCOUNTER — Encounter: Payer: Self-pay | Admitting: Family Medicine

## 2017-05-09 ENCOUNTER — Encounter: Payer: Self-pay | Admitting: Family Medicine

## 2017-05-09 ENCOUNTER — Ambulatory Visit (INDEPENDENT_AMBULATORY_CARE_PROVIDER_SITE_OTHER): Payer: BLUE CROSS/BLUE SHIELD | Admitting: Family Medicine

## 2017-05-09 VITALS — BP 138/100 | HR 78 | Ht 72.0 in | Wt 195.0 lb

## 2017-05-09 DIAGNOSIS — M1712 Unilateral primary osteoarthritis, left knee: Secondary | ICD-10-CM | POA: Diagnosis not present

## 2017-05-09 DIAGNOSIS — M999 Biomechanical lesion, unspecified: Secondary | ICD-10-CM

## 2017-05-09 DIAGNOSIS — R29898 Other symptoms and signs involving the musculoskeletal system: Secondary | ICD-10-CM

## 2017-05-09 NOTE — Patient Instructions (Signed)
Good to see you  Matthew [name] is your friend.  Stay active  we will get you set up for the brace Try a 1/2 size larger on the shoes.  See you again in 1 week for the knee.

## 2017-05-09 NOTE — Assessment & Plan Note (Signed)
Underlying arthritis is likely. We discussed with patient at great length. We discussed icing regimen and home exercises. We discussed which activities doing which ones to avoid. Patient will increase activity slowly. Well to manipulation. 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

## 2017-05-09 NOTE — Assessment & Plan Note (Signed)
Patient's physical conservative therapy and started on viscous supplementation. Patient will do more the home exercises, icing regimen, which activities to do a which ones to avoid. Patient will work on core strengthening instability. Follow-up again in 1 week for second in a series of 3 injections.

## 2017-05-09 NOTE — Assessment & Plan Note (Signed)
Decision today to treat with OMT was based on Physical Exam  After verbal consent patient was treated with HVLA, ME, FPR techniques in cervical, thoracic, rib lumbar and sacral areas  Patient tolerated the procedure well with improvement in symptoms  Patient given exercises, stretches and lifestyle modifications  See medications in patient instructions if given  Patient will follow up in 4-6 weeks 

## 2017-05-09 NOTE — Progress Notes (Signed)
[name] [address] [place], [place] [address] Phone: [phone] Subjective:     CC: Left knee pain, sleep problems  [id number]  Matthew [name] is a 61 y.o. male coming in with complaint of left knee pain. Past medical history significant for arthritic changes of the knee. Patient has had surgery on it previously. Was having worsening symptoms and was given an injection at last exam. States that unfortunately recently had some swelling again. Starting have worsening pain again. Looking for some other   Back pain seems to be stable. Does respond fairly well to the manipulation. Neck pain as well. Throbbing aching sensation. All bleeding asleep little bit better evaluate is taking medicines on a basis. No radiation of the pain..     Past Medical History:  Diagnosis Date  . Allergy    RHINITIS  . Colonic polyp 09/2006   colonoscopy  . Hypertension    Past Surgical History:  Procedure Laterality Date  . COLONOSCOPY  09/2006  . [place] SURGERY  1990   Social History   Social History  . Marital status: Married    Spouse name: N/A  . Number of children: N/A  . Years of education: N/A   Social History Main Topics  . Smoking status: Current Some Day Smoker    Types: Cigars  . Smokeless tobacco: Never Used  . Alcohol use 1.5 oz/week    3 drink(s) per week  . Drug use: No  . Sexual activity: Yes   Other Topics Concern  . None   Social History Narrative  . None   Allergies  Allergen Reactions  . Demerol    No family history on file. No family history of rheumatological diseases  Past medical history, social, surgical and family history all reviewed in electronic medical record.  No pertanent information unless stated regarding to the chief complaint.   Review of Systems: No headache, visual changes, nausea, vomiting, diarrhea, constipation, dizziness, abdominal pain, skin rash, fevers, chills, night sweats, weight loss,  swollen lymph nodes, body aches, muscle aches, chest pain, shortness of breath, mood changes. Positive joint swelling   Objective  Blood pressure (!) 138/100, pulse 78, height 6' (1.829 m), weight 195 lb (88.5 kg).   Systems examined below as of 05/09/17 General: NAD A&O x3 mood, affect normal  HEENT: Pupils equal, extraocular movements intact no nystagmus Respiratory: not short of breath at rest or with speaking Cardiovascular: No lower extremity edema, non tender Skin: Warm dry intact with no signs of infection or rash on extremities or on axial skeleton. Abdomen: Soft nontender, no masses Neuro: Cranial nerves  intact, neurovascularly intact in all extremities with 2+ DTRs and 2+ pulses. Lymph: No lymphadenopathy appreciated today  Gait normal with good balance and coordination.  MSK: Non tender with full range of motion and good stability and symmetric strength and tone of shoulders, elbows, wrist,  hips and ankles bilaterally.   Knee: valgus deformity noted.  Tender to palpation over medial and PF joint line.  ROM full in flexion and extension and lower leg rotation. instability with valgus force.  painful patellar compression. Patellar glide with moderate crepitus. Patellar and quadriceps tendons unremarkable. Hamstring and quadriceps strength is normal. Contralateral knee shows minimal changes    Neck: Inspection unremarkable. No palpable stepoffs. Negative Spurling's maneuver. Mild limitation in range of motion last 5 of extension Grip strength and sensation normal in bilateral hands Strength good C4 to T1 distribution No sensory change  to C4 to T1 Negative Hoffman sign bilaterally Reflexes normal  Osteopathic findingsl C2 flexed rotated and side bent right C5 flexed rotated and side bent left T3 extended rotated and side bent right inhaled third rib T6 extended rotated and side bent left L2 flexed rotated and side bent right Sacrum right on right  After  informed written and verbal consent, patient was seated on exam table. Left knee was prepped with alcohol swab and utilizing anterolateral approach, patient's left knee space was injected with 16 mg/2.5 mL of Synvisc (sodium hyaluronate) in a prefilled syringe was injected easily into the knee through a 22-gauge needle. Patient tolerated the procedure well without immediate complications.    Impression and Recommendations:     This case required medical decision making of moderate complexity.      Note: This dictation was prepared with Dragon dictation along with smaller phrase technology. Any transcriptional errors that result from this process are unintentional.

## 2017-05-15 ENCOUNTER — Encounter: Payer: Self-pay | Admitting: Family Medicine

## 2017-05-15 ENCOUNTER — Other Ambulatory Visit: Payer: BLUE CROSS/BLUE SHIELD

## 2017-05-15 ENCOUNTER — Ambulatory Visit (INDEPENDENT_AMBULATORY_CARE_PROVIDER_SITE_OTHER): Payer: BLUE CROSS/BLUE SHIELD | Admitting: Family Medicine

## 2017-05-15 DIAGNOSIS — M1712 Unilateral primary osteoarthritis, left knee: Secondary | ICD-10-CM

## 2017-05-15 MED ORDER — TRAMADOL HCL 50 MG PO TABS: 50.0000 mg | ORAL_TABLET | Freq: Three times a day (TID) | ORAL | 0 refills | Status: DC | PRN

## 2017-05-15 MED ORDER — PREDNISONE 50 MG PO TABS: 50.0000 mg | ORAL_TABLET | Freq: Every day | ORAL | 0 refills | Status: DC

## 2017-05-15 NOTE — Patient Instructions (Addendum)
Good to see you  We will get MRI as soon as I can  Tramadol up to 2 times a day for pain prednsione daily for 5 days starting tomorrow as well in case gout may play a little role.   Drained the knee again today and I hope it helps.  Depending on the MRI we will discuss.

## 2017-05-15 NOTE — Assessment & Plan Note (Addendum)
Worsening symptoms at this time. Discussed with patient at great length patient did have aspiration is some fluid today. Seems to be more of a synovitis than a true knee effusion. Patient will be given prednisone. Tramadol for breaks or pain. MRI ordered secondary to the lack of motion in the instability of the knee. Depending on findings we'll discuss further treatment options. We will need to talk to him about the possibility of arthroscopic procedure versus possible knee replacement the patient is very active. Differential also includes a possible gout flare.

## 2017-05-15 NOTE — Progress Notes (Signed)
Pre visit review using our clinic review tool, if applicable. No additional management support is needed unless otherwise documented below in the visit note. 

## 2017-05-15 NOTE — Progress Notes (Signed)
[name] [place], [place] [address] Phone: [phone] Subjective:     CC:  Left knee pain  [id number]  Matthew [name] is a 61 y.o. male coming in with complaint of left knee pain. Past medical history is significant for arthroscopic procedures. Patient does have severe osteophytic changes of the medial compartment. Failed all conservative therapy. Here for second in a series of 3 injections for viscous supplementation. Patient states that unfortunately worsening symptoms. Having locking. Giving out on him positive swelling. States that the pain is severe enough that keep him up at night. Wondering know what to the next step is. Pain worse then ever.     Past Medical History:  Diagnosis Date  . Allergy    RHINITIS  . Colonic polyp 09/2006   colonoscopy  . Hypertension    Past Surgical History:  Procedure Laterality Date  . COLONOSCOPY  09/2006  . [place] SURGERY  1990   Social History   Social History  . Marital status: Married    Spouse name: N/A  . Number of children: N/A  . Years of education: N/A   Social History Main Topics  . Smoking status: Current Some Day Smoker    Types: Cigars  . Smokeless tobacco: Never Used  . Alcohol use 1.5 oz/week    3 drink(s) per week  . Drug use: No  . Sexual activity: Yes   Other Topics Concern  . Not on file   Social History Narrative  . No narrative on file   Allergies  Allergen Reactions  . Demerol    No family history on file.  Past medical history, social, surgical and family history all reviewed in electronic medical record.  No pertanent information unless stated regarding to the chief complaint.   Review of Systems:Review of systems updated and as accurate as of 05/15/17  No headache, visual changes, nausea, vomiting, diarrhea, constipation, dizziness, abdominal pain, skin rash, fevers, chills, night sweats, weight loss, swollen lymph nodes, body aches, ,  chest pain, shortness of breath, mood changes. Positive muscle aches and joint swelling  Objective  There were no vitals taken for this visit. Systems examined below as of 05/15/17   General: No apparent distress alert and oriented x3 mood and affect normal, dressed appropriately.  HEENT: Pupils equal, extraocular movements intact  Respiratory: Patient's speak in full sentences and does not appear short of breath  Cardiovascular: No lower extremity edema, non tender, no erythema  Skin: Warm dry intact with no signs of infection or rash on extremities or on axial skeleton.  Abdomen: Soft nontender  Neuro: Cranial nerves II through XII are intact, neurovascularly intact in all extremities with 2+ DTRs and 2+ pulses.  Lymph: No lymphadenopathy of posterior or anterior cervical chain or axillae bilaterally.  Gait severely antalgic MSK:  Non tender with full range of motion and good stability and symmetric strength and tone of shoulders, elbows, wrist, hip, and ankles bilaterally.  Knee: Left valgus deformity noted. Severe swelling noted Tender to palpation over medial a joint line.  Significant decrease in flexion and lacking the last 10 as well as last 5 of extension instability with valgus force.  painful patellar compression. Patellar glide with moderate crepitus. Patellar and quadriceps tendons unremarkable. Hamstring and quadriceps strength unable to test secondary to pain Contralateral knee shows no significant arthritic changes  After informed written and verbal consent, patient was seated on exam table. Left knee was prepped with  alcohol swab and utilizing anterolateral approach, patient's left knee space was injected with 4:1  marcaine 0.5%: Kenalog 40mg /dL did aspirate 20 mL of strawlike colored fluid that was thick. Possible crystals noted. Patient tolerated the procedure well without immediate complications.  Impression and Recommendations:     This case required medical  decision making of moderate complexity.      Note: This dictation was prepared with Dragon dictation along with smaller phrase technology. Any transcriptional errors that result from this process are unintentional.

## 2017-05-16 ENCOUNTER — Ambulatory Visit
Admission: RE | Admit: 2017-05-16 | Discharge: 2017-05-16 | Disposition: A | Discharge status: Home / Self Care | Payer: BLUE CROSS/BLUE SHIELD | Source: Ambulatory Visit | Attending: Family Medicine | Admitting: Family Medicine

## 2017-05-16 DIAGNOSIS — M1712 Unilateral primary osteoarthritis, left knee: Secondary | ICD-10-CM

## 2017-05-16 LAB — SYNOVIAL CELL COUNT + DIFF, W/ CRYSTALS
BASOPHILS, %: 0 %
EOSINOPHILS-SYNOVIAL: 0 % (ref 0–2)
Lymphocytes-Synovial Fld: 31 % (ref 0–74)
Monocyte/Macrophage: 55 % (ref 0–69)
NEUTROPHIL, SYNOVIAL: 12 % (ref 0–24)
SYNOVIOCYTES, %: 2 % (ref 0–15)

## 2017-05-18 ENCOUNTER — Ambulatory Visit: Payer: BLUE CROSS/BLUE SHIELD | Admitting: Family Medicine

## 2017-05-21 NOTE — Progress Notes (Signed)
[name] [place], [place] [address] Phone: [phone] Subjective:    I'm seeing this patient by the request  of:    CC: Knee pain follow-up  FUX:[id number]  Matthew [name] is a 61 y.o. male coming in with complaint of knee pain. Left-sided. Patient does have history of CPPD. Worsening symptoms. Had a meniscal tear noted. Patient does have advanced medial compartment arthritis. Patient did have what appeared to be a knee effusion at last exam and was sent for an MRI. MRI and Bentley visualized by me confirming what was seen previously. Patient is here for second in a series of 3 injections for viscous supplementation. After taking prednisone was doing significantly better. Patient states Much better after last steroid injection. Patient states though still having pain on the medial aspect of the knee. Not working out likely regulated does. Is going to see another provider to discuss the unilateral compartment replacement.     Past Medical History:  Diagnosis Date  . Allergy    RHINITIS  . Colonic polyp 09/2006   colonoscopy  . Hypertension    Past Surgical History:  Procedure Laterality Date  . COLONOSCOPY  09/2006  . [place] SURGERY  1990   Social History   Social History  . Marital status: Married    Spouse name: N/A  . Number of children: N/A  . Years of education: N/A   Social History Main Topics  . Smoking status: Current Some Day Smoker    Types: Cigars  . Smokeless tobacco: Never Used  . Alcohol use 1.5 oz/week    3 drink(s) per week  . Drug use: No  . Sexual activity: Yes   Other Topics Concern  . Not on file   Social History Narrative  . No narrative on file   Allergies  Allergen Reactions  . Demerol    No family history on file. Scheduled Meds: Continuous Infusions: PRN Meds:.  Past medical history, social, surgical and family history all reviewed in electronic medical record.  No pertanent  information unless stated regarding to the chief complaint.   Review of Systems:Review of systems updated and as accurate as of 05/21/17  No headache, visual changes, nausea, vomiting, diarrhea, constipation, dizziness, abdominal pain, skin rash, fevers, chills, night sweats, weight loss, swollen lymph nodes, body aches, joint swelling, muscle aches, chest pain, shortness of breath, mood changes.   Objective  There were no vitals taken for this visit. Systems examined below as of 05/21/17   General: No apparent distress alert and oriented x3 mood and affect normal, dressed appropriately.  HEENT: Pupils equal, extraocular movements intact  Respiratory: Patient's speak in full sentences and does not appear short of breath  Cardiovascular: No lower extremity edema, non tender, no erythema  Skin: Warm dry intact with no signs of infection or rash on extremities or on axial skeleton.  Abdomen: Soft nontender  Neuro: Cranial nerves II through XII are intact, neurovascularly intact in all extremities with 2+ DTRs and 2+ pulses.  Lymph: No lymphadenopathy of posterior or anterior cervical chain or axillae bilaterally.  Gait normal with good balance and coordination.  MSK:  Non tender with full range of motion and good stability and symmetric strength and tone of shoulders, elbows, wrist, hip, and ankles bilaterally.  Knee: Left valgus deformity noted.  Tender to palpation over medial   ROM full in flexion and extension and lower leg rotation. Minimal instability with valgus force  painful  patellar compression. Patellar glide with moderate crepitus. Patellar and quadriceps tendons unremarkable. Hamstring and quadriceps strength is normal. Contralateral knee shows No arthritic changes  Procedure: Real-time Ultrasound Guided Injection of left knee Device: GE Logiq Q7 Ultrasound guided injection is preferred based studies that show increased duration, increased effect, greater accuracy, decreased  procedural pain, increased response rate, and decreased cost with ultrasound guided versus blind injection.  Verbal informed consent obtained.  Time-out conducted.  Noted no overlying erythema, induration, or other signs of local infection.  Skin prepped in a sterile fashion.  Local anesthesia: Topical Ethyl chloride.  With sterile technique and under real time ultrasound guidance: With a 22-gauge 2 inch needle patient was injected with 4 cc of 0.5% Marcaine and aspirated 12 mL of strawlike fluid then injected  1 cc of Kenalog 40 mg/dL. This was from a superior lateral approach.  Completed without difficulty  Pain immediately resolved suggesting accurate placement of the medication.  Advised to call if fevers/chills, erythema, induration, drainage, or persistent bleeding.  Images permanently stored and available for review in the ultrasound unit.  Impression: Technically successful ultrasound guided injection.   Impression and Recommendations:     This case required medical decision making of moderate complexity.      Note: This dictation was prepared with Dragon dictation along with smaller phrase technology. Any transcriptional errors that result from this process are unintentional.

## 2017-05-22 ENCOUNTER — Encounter: Payer: Self-pay | Admitting: Family Medicine

## 2017-05-22 ENCOUNTER — Ambulatory Visit: Payer: Self-pay

## 2017-05-22 ENCOUNTER — Ambulatory Visit (INDEPENDENT_AMBULATORY_CARE_PROVIDER_SITE_OTHER): Payer: BLUE CROSS/BLUE SHIELD | Admitting: Family Medicine

## 2017-05-22 VITALS — BP 128/82 | HR 67 | Ht 72.0 in | Wt 198.0 lb

## 2017-05-22 DIAGNOSIS — M109 Gout, unspecified: Secondary | ICD-10-CM

## 2017-05-22 DIAGNOSIS — M1712 Unilateral primary osteoarthritis, left knee: Secondary | ICD-10-CM | POA: Diagnosis not present

## 2017-05-22 MED ORDER — ALLOPURINOL 300 MG PO TABS: 300.0000 mg | ORAL_TABLET | Freq: Every day | ORAL | 6 refills | Status: DC

## 2017-05-22 NOTE — Assessment & Plan Note (Signed)
Second in a series of 3 injections for viscous supplementation. Patient will be discussing the possibility of replacement with another provider. Patient will return in one week for third and final injection. History of CPPD and increase patient's allopurinol to 300 mg.

## 2017-05-22 NOTE — Assessment & Plan Note (Signed)
Increase allopurinol to 300 mg. ?

## 2017-05-22 NOTE — Patient Instructions (Addendum)
See you next week  Allopurinol 300mg  at night  See you next week

## 2017-05-27 ENCOUNTER — Other Ambulatory Visit: Payer: Self-pay | Admitting: Family Medicine

## 2017-05-29 ENCOUNTER — Ambulatory Visit (INDEPENDENT_AMBULATORY_CARE_PROVIDER_SITE_OTHER): Payer: BLUE CROSS/BLUE SHIELD | Admitting: Family Medicine

## 2017-05-29 ENCOUNTER — Encounter: Payer: Self-pay | Admitting: Family Medicine

## 2017-05-29 DIAGNOSIS — M1712 Unilateral primary osteoarthritis, left knee: Secondary | ICD-10-CM | POA: Diagnosis not present

## 2017-05-29 NOTE — Patient Instructions (Signed)
You are done with the injections.  Stay active Lets hope the other medicines will help  See me again in 4 weeks.

## 2017-05-29 NOTE — Assessment & Plan Note (Signed)
Patient does have some osteophytic changes. Did well with synvisc.  Getting custom brace Following up with surgeon to discuss options RTC in 4 weeks

## 2017-05-29 NOTE — Progress Notes (Signed)
[name] [place], [place] [address] Phone: [phone] Subjective:    I'm seeing this patient by the request  of:    CC: Knee pain follow-up  LYY:[id number]  Matthew [name] is a 61 y.o. male coming in with complaint of knee pain. Left-sided. Patient does have history of CPPD. Worsening symptoms. Had a meniscal tear noted. Patient does have advanced medial compartment arthritis. Patient did have what appeared to be a knee effusion at last exam and was sent for an MRI. MRI and Bentley visualized by me confirming what was seen previously. Patient is here for 3rd in a series of 3 injections for viscous supplementation.Patient is doing relatively well. Making some improvement.     Past Medical History:  Diagnosis Date  . Allergy    RHINITIS  . Colonic polyp 09/2006   colonoscopy  . Hypertension    Past Surgical History:  Procedure Laterality Date  . COLONOSCOPY  09/2006  . [place] SURGERY  1990   Social History   Social History  . Marital status: Married    Spouse name: N/A  . Number of children: N/A  . Years of education: N/A   Social History Main Topics  . Smoking status: Current Some Day Smoker    Types: Cigars  . Smokeless tobacco: Never Used  . Alcohol use 1.5 oz/week    3 drink(s) per week  . Drug use: No  . Sexual activity: Yes   Other Topics Concern  . None   Social History Narrative  . None   Allergies  Allergen Reactions  . Demerol    No family history on file. Scheduled Meds: Continuous Infusions: PRN Meds:.  Past medical history, social, surgical and family history all reviewed in electronic medical record.  No pertanent information unless stated regarding to the chief complaint.   Review of Systems: No headache, visual changes, nausea, vomiting, diarrhea, constipation, dizziness, abdominal pain, skin rash, fevers, chills, night sweats, weight loss, swollen lymph nodes, body aches,, chest pain,  shortness of breath, mood changes.  Positive muscle aches and joint swelling.   Objective  Blood pressure 124/80, pulse 72, height 6' (1.829 m), SpO2 98 %.   Systems examined below as of 05/29/17 General: NAD A&O x3 mood, affect normal  HEENT: Pupils equal, extraocular movements intact no nystagmus Respiratory: not short of breath at rest or with speaking Cardiovascular: No lower extremity edema, non tender Skin: Warm dry intact with no signs of infection or rash on extremities or on axial skeleton. Abdomen: Soft nontender, no masses Neuro: Cranial nerves  intact, neurovascularly intact in all extremities with 2+ DTRs and 2+ pulses. Lymph: No lymphadenopathy appreciated today  Gait normal with good balance and coordination.  MSK: Non tender with full range of motion and good stability and symmetric strength and tone of shoulders, elbows, wrist,  hips and ankles bilaterally.   Knee:Left valgus deformity noted. Large thigh to calf ratio.  Tender to palpation over medial and PF joint line.  ROM full in flexion and extension and lower leg rotation. instability with valgus force.  painful patellar compression. Patellar glide with moderate crepitus. Patellar and quadriceps tendons unremarkable. Hamstring and quadriceps strength is normal. Contralateral knee shows no significant arthritic changes  After informed written and verbal consent, patient was seated on exam table. Left knee was prepped with alcohol swab and utilizing anterolateral approach, patient's left knee space was injected with 16 mg/2.5 mL of Synvisc (sodium hyaluronate) in  a prefilled syringe was injected easily into the knee through a 22-gauge needle. Patient tolerated the procedure well without immediate complications.     Impression and Recommendations:     This case required medical decision making of moderate complexity.      Note: This dictation was prepared with Dragon dictation along with smaller phrase  technology. Any transcriptional errors that result from this process are unintentional.

## 2017-06-01 ENCOUNTER — Other Ambulatory Visit: Payer: Self-pay | Admitting: Family Medicine

## 2017-06-11 ENCOUNTER — Other Ambulatory Visit: Payer: Self-pay | Admitting: Family Medicine

## 2017-07-10 ENCOUNTER — Other Ambulatory Visit: Payer: Self-pay | Admitting: *Deleted

## 2017-07-10 ENCOUNTER — Other Ambulatory Visit: Payer: Self-pay | Admitting: Family Medicine

## 2017-07-10 DIAGNOSIS — G8929 Other chronic pain: Secondary | ICD-10-CM

## 2017-07-10 DIAGNOSIS — M25562 Pain in left knee: Principal | ICD-10-CM

## 2017-07-10 MED ORDER — PREDNISONE 50 MG PO TABS: 50.0000 mg | ORAL_TABLET | Freq: Every day | ORAL | 0 refills | Status: DC

## 2017-07-11 ENCOUNTER — Encounter: Payer: Self-pay | Admitting: Physical Therapy

## 2017-07-11 ENCOUNTER — Ambulatory Visit: Payer: BLUE CROSS/BLUE SHIELD | Attending: Physical Medicine & Rehabilitation | Admitting: Physical Therapy

## 2017-07-11 DIAGNOSIS — M6281 Muscle weakness (generalized): Secondary | ICD-10-CM | POA: Insufficient documentation

## 2017-07-11 DIAGNOSIS — M25562 Pain in left knee: Secondary | ICD-10-CM | POA: Insufficient documentation

## 2017-07-11 DIAGNOSIS — G8929 Other chronic pain: Secondary | ICD-10-CM | POA: Diagnosis present

## 2017-07-11 NOTE — Patient Instructions (Signed)
   Leg press single leg - 10 to 15 reps starting with left leg (should be challenging on the last couple) do 2-3 sets   HIP ABDUCTION - SIDELYING  While lying on your side, slowly raise up your top leg to the side. Keep your knee straight and maintain your toes pointed forward the entire time. Keep your leg in-line with your body.  The bottom leg can be bent to stabilize your body.  15 x 3 sets    CERVICAL CHIN TUCK  AND RETRACTION - SUPINE WITH TOWEL  While lying on your back with a small folded up towel under your head, tuck your chin towards your chest. Also, focus on putting pressure on the towel with the back of your head.   Maintain contact of head with the towel the entire time.  Hold 5 seconds repeat 10

## 2017-07-11 NOTE — Therapy (Signed)
[facility] [address], [place], [place], [address] Phone: [phone]   Fax:  [phone]  Physical Therapy Evaluation  Patient Details  Name: Matthew [name] MRN: [id number] Date of Birth: 05/01/1956 Referring Provider: Lyndal Pulley, DO  Encounter Date: 07/11/2017      PT End of Session - 07/11/17 1516    Visit Number 1   Date for PT Re-Evaluation 09/05/17   PT Start Time 1526   PT Stop Time 9937   PT Time Calculation (min) 48 min   Activity Tolerance Patient tolerated treatment well   Behavior During Therapy [facility] for tasks assessed/performed      Past Medical History:  Diagnosis Date  . Allergy    RHINITIS  . Colonic polyp 09/2006   colonoscopy  . Hypertension     Past Surgical History:  Procedure Laterality Date  . COLONOSCOPY  09/2006  . LUMBAR DISC SURGERY  1990    There were no vitals filed for this visit.       Subjective Assessment - 07/11/17 1531    Subjective The knee is primary issue and will get gout flare ups.  Had surgery 7 years ago and then earlier this year I turned the knee.  I have some neck and shoulder pain and have also groin pain on the right side.  I have torn the h/s previously and it hurts sometimes at night.     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 Yes   Pain Score 2    Pain Location Knee   Pain Orientation Left;Medial   Pain Descriptors / Indicators Dull  sharp with gout   Pain Onset More than a month ago   Pain Frequency Intermittent   Aggravating Factors  reffing lacrosse, walking downhill   Pain Relieving Factors ice, tylenol   Effect of Pain on Daily Activities haven't run 6 weeks   Multiple Pain Sites Yes   Pain Score 4  gets up to 8/10   Pain Location Neck   Pain Orientation Right;Left   Pain Descriptors / Indicators Tightness   Pain Type Acute pain   Pain Radiating Towards to shoulder   Pain Onset  More than a month ago   Pain Frequency Intermittent   Aggravating Factors  sitting and driving   Pain Relieving Factors stretches, sitting up staight   Effect of Pain on Daily Activities turning head especially right            [facility] PT Assessment - 07/11/17 0001      Assessment   Medical Diagnosis M25.562,G89.29 (ICD-10-CM) - Chronic pain of left knee   Referring Provider Lyndal Pulley, DO   Prior Therapy no     Precautions   Precautions None     Restrictions   Weight Bearing Restrictions No     Balance Screen   Has the patient fallen in the past 6 months Yes   How many times? 1  playing [place]   Living Arrangements Spouse/significant other     Prior Function   Level of Independence Independent   Vocation Full time employment   Vocation Requirements driving and sitting     Cognition   Overall Cognitive Status Within Functional Limits for tasks assessed     Observation/Other Assessments   Focus on Therapeutic Outcomes (FOTO)  46% limited  Functional Tests   Functional tests Step down     Step Down   Comments hip drop left>right     Posture/Postural Control   Posture/Postural Control Postural limitations   Postural Limitations Rounded Shoulders  elevated shoulders     ROM / Strength   AROM / PROM / Strength Strength;AROM     AROM   Overall AROM Comments left knee flex 138 deg; right knee flex 141 deg     Strength   Overall Strength Comments left hip 4+/5, right hip 5/5     Palpation   Patella mobility WNL   Palpation comment right hip adductor tight     Ambulation/Gait   Gait Pattern Within Functional Limits            Objective measurements completed on examination: See above findings.          [facility] PT Treatment/Exercise - 07/11/17 0001      Exercises   Exercises --  HEP educated and performed                PT Education - 07/11/17 1659     Education provided Yes   Education Details leg press, hip abduction, cervical retraction   Person(s) Educated Patient   Methods Explanation;Demonstration;Tactile cues;Verbal cues;Handout   Comprehension Verbalized understanding;Returned demonstration          PT Short Term Goals - 07/11/17 1704      PT SHORT TERM GOAL #1   Title ind with initial HEP   Time 4   Period Weeks   Status New   Target Date 08/08/17           PT Long Term Goals - 07/11/17 1701      PT LONG TERM GOAL #1   Title FOTO < or = to 35% limited   Time 8   Period Weeks   Status New   Target Date 09/05/17     PT LONG TERM GOAL #2   Title independent with advanced HEP   Time 8   Period Weeks   Status New   Target Date 09/05/17     PT LONG TERM GOAL #3   Title able to perform step down 10x from 6" step without hip drop for improved LE stability in activities   Time 8   Period Weeks   Status New   Target Date 09/05/17     PT LONG TERM GOAL #4   Title able to lightly jog for 10 minute intervals without increased pain in order to return to normal activities.   Time 8   Period Weeks   Status New   Target Date 09/05/17                Plan - 07/11/17 1704    Clinical Impression Statement Patient presents to clinic with main compaint of left knee pain that has been chronic but gotten worse since twisting his leg several months ago.  Pt demonstrates LE weakness in left LE>right LE demonstrated by inablitliy to perform step down without dropping his hip.  He has muscle spasms in right adductors and decreased left knee flexion. 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  He will benefit from skilled PT in order to improve function and be able to participate in his activities to immprove his quality of life.    History and Personal Factors relevant to plan of care: OA, gout   Clinical Presentation Stable  Clinical Presentation due to: patient is stable    Clinical Decision Making Low   Clinical Impairments Affecting Rehab Potential OA, gout   PT Frequency 1x / week   PT Duration 8 weeks   PT Treatment/Interventions ADLs/Self Care Home Management;Biofeedback;Electrical Stimulation;Iontophoresis 4mg /ml Dexamethasone;Moist Heat;Patient/family education;Therapeutic activities;Therapeutic exercise;Balance training;Neuromuscular re-education;Passive range of motion;Manual techniques;Dry needling   PT Next Visit Plan discuss DN to adductor, review foam rolling to adductors, hip strength, single leg strengthening, look at running gait on TM, review step downs and HEP   Recommended Other Services n/a   Consulted and Agree with Plan of Care Patient      Patient will benefit from skilled therapeutic intervention in order to improve the following deficits and impairments:  Postural dysfunction, Impaired flexibility, Pain, Increased muscle spasms, Decreased strength, Decreased range of motion  Visit Diagnosis: Chronic pain of left knee  Muscle weakness (generalized)     Problem List Patient Active Problem List   Diagnosis Date Noted  . Degenerative arthritis of left knee 03/22/2017  . Sleep deficient 03/22/2017  . Acute meniscal tear of knee, left, initial encounter 12/09/2016  . Bursitis of left foot 11/11/2016  . Subluxation of tendon of long head of biceps 10/15/2016  . Leg cramping 10/15/2016  . Hamstring tendinitis of left thigh 07/26/2016  . Gout 07/26/2016  . Osteitis pubis ([place]) 01/27/2016  . Left knee pain 01/27/2016  . Neck tightness 08/12/2015  . Nonallopathic lesion of cervical region 08/12/2015  . Nonallopathic lesion of thoracic region 08/12/2015  . Nonallopathic lesion-rib cage 08/12/2015  . ACE-inhibitor cough 08/12/2015  . GERD (gastroesophageal reflux disease) 08/01/2014  . Neck pain 03/03/2014  . Hypertension 02/14/2014  . Allergic rhinitis 02/14/2014  . Herpes genitalis in men 02/14/2014  . Personal history of  colonic polyps 05/06/2011  . Pes planus 04/30/2008    Zannie Cove, PT 07/11/2017, 5:20 PM  [facility] [address], [place], [place], [address] Phone: [phone]   Fax:  [phone]  Name: Matthew [name] MRN: [id number] Date of Birth: [birth year]

## 2017-07-19 ENCOUNTER — Encounter: Payer: Self-pay | Admitting: Physical Therapy

## 2017-07-19 ENCOUNTER — Ambulatory Visit: Payer: BLUE CROSS/BLUE SHIELD | Admitting: Physical Therapy

## 2017-07-19 DIAGNOSIS — M25562 Pain in left knee: Secondary | ICD-10-CM | POA: Diagnosis not present

## 2017-07-19 DIAGNOSIS — M6281 Muscle weakness (generalized): Secondary | ICD-10-CM

## 2017-07-19 DIAGNOSIS — G8929 Other chronic pain: Secondary | ICD-10-CM

## 2017-07-19 NOTE — Therapy (Signed)
[facility] [address] [place], [place], [address] Phone: [phone]   Fax:  [phone]  Physical Therapy Treatment  Patient Details  Name: Matthew [name] MRN: [id number] Date of Birth: December 11, 1955 Referring Provider: Lyndal Pulley, DO  Encounter Date: 07/19/2017      PT End of Session - 07/19/17 0806    Visit Number 2  dry needle   Date for PT Re-Evaluation 09/05/17   PT Start Time 0803   PT Stop Time 0844   PT Time Calculation (min) 41 min   Activity Tolerance Patient tolerated treatment well   Behavior During Therapy [facility] for tasks assessed/performed      Past Medical History:  Diagnosis Date  . Allergy    RHINITIS  . Colonic polyp 09/2006   colonoscopy  . Hypertension     Past Surgical History:  Procedure Laterality Date  . COLONOSCOPY  09/2006  . LUMBAR DISC SURGERY  1990    There were no vitals filed for this visit.      Subjective Assessment - 07/19/17 0805    Subjective I had a little pain last night in my knee, but none currently.     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 No/denies                         [facility] Adult PT Treatment/Exercise - 07/19/17 0001      Exercises   Exercises Lumbar     Lumbar Exercises: Aerobic   Stationary Bike L2 x 6 min  PT present to discuss treatment   Tread Mill jogging 4.5 speed x 3 min  see assessment for notes on gait     Lumbar Exercises: Standing   Other Standing Lumbar Exercises squat with isometric obliques - red band; trunk rotation bilat red band - 10x each way  cue to keep heels down when squatting     Knee/Hip Exercises: Stretches   Other Knee/Hip Stretches adductor butterfly stretch 30 sec hold; foam rolling to adductors x 2 min     Knee/Hip Exercises: Sidelying   Hip ABduction Strengthening;Right;Left;10 reps  cue to keep leg in line; increased hip ext      Manual Therapy   Manual Therapy Soft tissue mobilization   Soft tissue mobilization right adductors          Trigger Point Dry Needling - 07/19/17 0951    Consent Given? Yes   Education Handout Provided No  verbally provided   Muscles Treated Lower Body Adductor longus/brevius/maximus   Adductor Response Twitch response elicited;Palpable increased muscle length                PT Short Term Goals - 07/19/17 0086      PT SHORT TERM GOAL #1   Title ind with initial HEP   Time 4   Period Weeks   Status On-going           PT Long Term Goals - 07/19/17 7619      PT LONG TERM GOAL #1   Title FOTO < or = to 35% limited   Time 8   Period Weeks   Status On-going     PT LONG TERM GOAL #2   Title independent with advanced HEP   Time 8   Period Weeks   Status On-going     PT LONG TERM GOAL #3  Title able to perform step down 10x from 6" step without hip drop for improved LE stability in activities   Time 8   Period Weeks   Status On-going     PT LONG TERM GOAL #4   Title able to lightly jog for 10 minute intervals without increased pain in order to return to normal activities.   Time 8   Period Weeks   Status On-going               Plan - 07/19/17 0806    Clinical Impression Statement Assessed running gait on TM and pt has mid-foot strike on outside of feet, right LE internal rotation and some medial collapse, no hip extension, more trunk rotation to the right.  Pt responded well to manual and dry need to right adductors.  Very tight and tender to palpation.  He was able to perform foam rolling and had improved hip flexion with less pain after treatment,.  Pt has difficulty with trunk rotation and needs cues for rotation to left.  He demonstrates improved squat technique with cues a to keep weight in heels. Pt will benefit from skilled PT to work on strength imbalances and posture for improved running gait and return to functional activities.   PT  Treatment/Interventions ADLs/Self Care Home Management;Biofeedback;Electrical Stimulation;Iontophoresis 4mg /ml Dexamethasone;Moist Heat;Patient/family education;Therapeutic activities;Therapeutic exercise;Balance training;Neuromuscular re-education;Passive range of motion;Manual techniques;Dry needling   PT Next Visit Plan f/u on right groin pain after DN #1, squat form, posture with exercises, single leg strengthening in standing, step up/down as tolerated   Consulted and Agree with Plan of Care Patient      Patient will benefit from skilled therapeutic intervention in order to improve the following deficits and impairments:  Postural dysfunction, Impaired flexibility, Pain, Increased muscle spasms, Decreased strength, Decreased range of motion  Visit Diagnosis: Chronic pain of left knee  Muscle weakness (generalized)     Problem List Patient Active Problem List   Diagnosis Date Noted  . Degenerative arthritis of left knee 03/22/2017  . Sleep deficient 03/22/2017  . Acute meniscal tear of knee, left, initial encounter 12/09/2016  . Bursitis of left foot 11/11/2016  . Subluxation of tendon of long head of biceps 10/15/2016  . Leg cramping 10/15/2016  . Hamstring tendinitis of left thigh 07/26/2016  . Gout 07/26/2016  . Osteitis pubis ([place]) 01/27/2016  . Left knee pain 01/27/2016  . Neck tightness 08/12/2015  . Nonallopathic lesion of cervical region 08/12/2015  . Nonallopathic lesion of thoracic region 08/12/2015  . Nonallopathic lesion-rib cage 08/12/2015  . ACE-inhibitor cough 08/12/2015  . GERD (gastroesophageal reflux disease) 08/01/2014  . Neck pain 03/03/2014  . Hypertension 02/14/2014  . Allergic rhinitis 02/14/2014  . Herpes genitalis in men 02/14/2014  . Personal history of colonic polyps 05/06/2011  . Pes planus 04/30/2008    Zannie Cove, PT 07/19/2017, 9:54 AM  [facility] [address], [place], [place], [address] Phone: [phone]   Fax:  [phone]  Name: Matthew [name] MRN: [id number] Date of Birth: Jul 23, 1956

## 2017-07-27 ENCOUNTER — Ambulatory Visit: Payer: BLUE CROSS/BLUE SHIELD | Admitting: Physical Therapy

## 2017-07-27 DIAGNOSIS — M6281 Muscle weakness (generalized): Secondary | ICD-10-CM

## 2017-07-27 DIAGNOSIS — M25562 Pain in left knee: Secondary | ICD-10-CM | POA: Diagnosis not present

## 2017-07-27 DIAGNOSIS — G8929 Other chronic pain: Secondary | ICD-10-CM

## 2017-07-27 NOTE — Therapy (Signed)
[facility] [address] [place], [place], [address] Phone: [phone]   Fax:  [phone]  Physical Therapy Treatment  Patient Details  Name: Matthew [name] MRN: [id number] Date of Birth: [birth year] Referring Provider: Lyndal Pulley, DO  Encounter Date: 07/27/2017      PT End of Session - 07/27/17 0848    Visit Number 3   Date for PT Re-Evaluation 09/05/17   PT Start Time 0801   PT Stop Time 0845   PT Time Calculation (min) 44 min   Activity Tolerance Patient tolerated treatment well;No increased pain   Behavior During Therapy WFL for tasks assessed/performed      Past Medical History:  Diagnosis Date  . Allergy    RHINITIS  . Colonic polyp 09/2006   colonoscopy  . Hypertension     Past Surgical History:  Procedure Laterality Date  . COLONOSCOPY  09/2006  . LUMBAR DISC SURGERY  1990    There were no vitals filed for this visit.      Subjective Assessment - 07/27/17 1108    Subjective Pt reports that he found some relief following the dry needling completed last session. He is still frustrated with the stiffness and fatigue in his quads during activity and exercise.    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 No/denies                         [facility] Adult PT Treatment/Exercise - 07/27/17 0001      Lumbar Exercises: Standing   Other Standing Lumbar Exercises Hip hike/lower from 6" box 2x15 reps each    Other Standing Lumbar Exercises BUE pressdown with knee flexion 2x15 reps each with green TB      Lumbar Exercises: Supine   Bridge 10 reps   Bridge Limitations Single leg lock bridge      Manual Therapy   Soft tissue mobilization IASTM Lt quadriceps (medial and lateral)          Trigger Point Dry Needling - 07/27/17 0846    Consent Given? Yes   Education Handout Provided No  pt declines, therapist provided verbal  instruction   Muscles Treated Lower Body Quadriceps  Lt    Quadriceps Response Twitch response elicited;Palpable increased muscle length              PT Education - 07/27/17 0848    Education provided Yes   Education Details difference between strength and stability, importance of addressing trigger points along quadriceps to decrease tightness with activity; dry needling implications and aftercare; technique with therex   Person(s) Educated Patient   Methods Explanation;Verbal cues;Tactile cues   Comprehension Verbalized understanding;Returned demonstration          PT Short Term Goals - 07/19/17 6962      PT SHORT TERM GOAL #1   Title ind with initial HEP   Time 4   Period Weeks   Status On-going           PT Long Term Goals - 07/19/17 9528      PT LONG TERM GOAL #1   Title FOTO < or = to 35% limited   Time 8   Period Weeks   Status On-going     PT LONG TERM GOAL #2   Title independent with advanced HEP   Time 8   Period Weeks   Status  On-going     PT LONG TERM GOAL #3   Title able to perform step down 10x from 6" step without hip drop for improved LE stability in activities   Time 8   Period Weeks   Status On-going     PT LONG TERM GOAL #4   Title able to lightly jog for 10 minute intervals without increased pain in order to return to normal activities.   Time 8   Period Weeks   Status On-going               Plan - 07/27/17 0941    Clinical Impression Statement Pt arrived with reported improvements in medial thigh pain following dry needling last session. Primary complaint at this time is his fatigue and tightness along the anterior thighs. Therapist noted palpable trigger points throughout the quadriceps, and completed soft tissue techniques to address this. Ended with trial of dry needling to determine muscle reaction, noting twitch response along vastus lateralis. Will likely complete more of this during the next session to further  improve quadriceps activation. Ended session without report of pain or fatigue.    PT Treatment/Interventions ADLs/Self Care Home Management;Biofeedback;Electrical Stimulation;Iontophoresis 4mg /ml Dexamethasone;Moist Heat;Patient/family education;Therapeutic activities;Therapeutic exercise;Balance training;Neuromuscular re-education;Passive range of motion;Manual techniques;Dry needling   PT Next Visit Plan DN B quadriceps, deadlifts, single leg clamshell, single leg step down   Consulted and Agree with Plan of Care Patient      Patient will benefit from skilled therapeutic intervention in order to improve the following deficits and impairments:  Postural dysfunction, Impaired flexibility, Pain, Increased muscle spasms, Decreased strength, Decreased range of motion  Visit Diagnosis: Chronic pain of left knee  Muscle weakness (generalized)     Problem List Patient Active Problem List   Diagnosis Date Noted  . Degenerative arthritis of left knee 03/22/2017  . Sleep deficient 03/22/2017  . Acute meniscal tear of knee, left, initial encounter 12/09/2016  . Bursitis of left foot 11/11/2016  . Subluxation of tendon of long head of biceps 10/15/2016  . Leg cramping 10/15/2016  . Hamstring tendinitis of left thigh 07/26/2016  . Gout 07/26/2016  . Osteitis pubis ([place]) 01/27/2016  . Left knee pain 01/27/2016  . Neck tightness 08/12/2015  . Nonallopathic lesion of cervical region 08/12/2015  . Nonallopathic lesion of thoracic region 08/12/2015  . Nonallopathic lesion-rib cage 08/12/2015  . ACE-inhibitor cough 08/12/2015  . GERD (gastroesophageal reflux disease) 08/01/2014  . Neck pain 03/03/2014  . Hypertension 02/14/2014  . Allergic rhinitis 02/14/2014  . Herpes genitalis in men 02/14/2014  . Personal history of colonic polyps 05/06/2011  . Pes planus 04/30/2008   11:12 AM,07/27/17 Elly Modena PT, DPT [facility] [address], [place], [place], [address] Phone: [phone]   Fax:  [phone]  Name: Matthew [name] MRN: [id number] Date of Birth: 01-14-1956

## 2017-07-31 ENCOUNTER — Other Ambulatory Visit: Payer: Self-pay | Admitting: Family Medicine

## 2017-07-31 NOTE — Telephone Encounter (Signed)
Refill done.  

## 2017-08-03 ENCOUNTER — Ambulatory Visit: Payer: BLUE CROSS/BLUE SHIELD | Attending: Physical Medicine & Rehabilitation | Admitting: Physical Therapy

## 2017-08-03 DIAGNOSIS — G8929 Other chronic pain: Secondary | ICD-10-CM | POA: Insufficient documentation

## 2017-08-03 DIAGNOSIS — M25562 Pain in left knee: Secondary | ICD-10-CM | POA: Insufficient documentation

## 2017-08-03 DIAGNOSIS — M6281 Muscle weakness (generalized): Secondary | ICD-10-CM | POA: Diagnosis present

## 2017-08-03 NOTE — Therapy (Signed)
[facility] [address], [place], [place], [address] Phone: [phone]   Fax:  [phone]  Physical Therapy Treatment  Patient Details  Name: Matthew [name] MRN: [id number] Date of Birth: [birth year] Referring Provider: Lyndal Pulley, DO  Encounter Date: 08/03/2017      PT End of Session - 08/03/17 0854    Visit Number 4   Date for PT Re-Evaluation 09/05/17   PT Start Time 0800   PT Stop Time 0845   PT Time Calculation (min) 45 min   Activity Tolerance Patient tolerated treatment well;No increased pain   Behavior During Therapy WFL for tasks assessed/performed      Past Medical History:  Diagnosis Date  . Allergy    RHINITIS  . Colonic polyp 09/2006   colonoscopy  . Hypertension     Past Surgical History:  Procedure Laterality Date  . COLONOSCOPY  09/2006  . LUMBAR DISC SURGERY  1990    There were no vitals filed for this visit.      Subjective Assessment - 08/03/17 0804    Subjective (P)  Pt reports that he found some relief following the dry needling completed last session. He is still frustrated with the stiffness and fatigue in his quads during activity and exercise.    Patient Stated Goals (P)  be able to ref lacrosse and be able to run                   [facility] Adult PT Treatment/Exercise - 08/03/17 0001      Lumbar Exercises: Standing   Other Standing Lumbar Exercises Single leg dead lift x5 reps each, tactile cues for proper lumbar alignment     Knee/Hip Exercises: Stretches   Other Knee/Hip Stretches Standing Lt quad stretch with mini squat hold 5x10 sec      Manual Therapy   Soft tissue mobilization STM Lt quadriceps/ITB          Trigger Point Dry Needling - 08/03/17 0857    Consent Given? Yes   Education Handout Provided No  PT provided information verbally    Muscles Treated Lower Body Quadriceps   Quadriceps Response Twitch response elicited;Palpable  increased muscle length              PT Education - 08/03/17 0851    Education provided Yes   Education Details after care following today's session; technique with therex    Person(s) Educated Patient   Methods Explanation;Verbal cues   Comprehension Verbalized understanding;Returned demonstration          PT Short Term Goals - 07/19/17 0808      PT SHORT TERM GOAL #1   Title ind with initial HEP   Time 4   Period Weeks   Status On-[name]           PT Long Term Goals - 07/19/17 1245      PT LONG TERM GOAL #1   Title FOTO < or = to 35% limited   Time 8   Period Weeks   Status On-[name]     PT LONG TERM GOAL #2   Title independent with advanced HEP   Time 8   Period Weeks   Status On-[name]     PT LONG TERM GOAL #3   Title able to perform step down 10x from 6" step without hip drop for improved LE stability in activities   Time 8   Period Weeks   Status On-[name]  PT LONG TERM GOAL #4   Title able to lightly jog for 10 minute intervals without increased pain in order to return to normal activities.   Time 8   Period Weeks   Status On-[name]               Plan - 08/03/17 0854    Clinical Impression Statement Pt is making progress towards improving pain along the lateral knee, however his medial knee pain has returned since his last session. Focused on dry needling this session, in addition to other manual techniques to address trigger points throughout the Lt quadriceps primarily. Noted local twitch responses and improvements in tissue flexibility by the end of today's interventions. Educated pt on proper after care and he verbalized understanding. Will continue with current POC to decrease pain, improve flexibility and stability with daily activity.    PT Treatment/Interventions ADLs/Self Care Home Management;Biofeedback;Electrical Stimulation;Iontophoresis 4mg /ml Dexamethasone;Moist Heat;Patient/family education;Therapeutic activities;Therapeutic  exercise;Balance training;Neuromuscular re-education;Passive range of motion;Manual techniques;Dry needling   PT Next Visit Plan f/u on dry needling response, deadlifts (bilateral and single), single leg clamshell, single leg step down   PT Home Exercise Plan foam rolling 5-10 min/day    Consulted and Agree with Plan of Care Patient      Patient will benefit from skilled therapeutic intervention in order to improve the following deficits and impairments:  Postural dysfunction, Impaired flexibility, Pain, Increased muscle spasms, Decreased strength, Decreased range of motion  Visit Diagnosis: Chronic pain of left knee  Muscle weakness (generalized)     Problem List Patient Active Problem List   Diagnosis Date Noted  . Degenerative arthritis of left knee 03/22/2017  . Sleep deficient 03/22/2017  . Acute meniscal tear of knee, left, initial encounter 12/09/2016  . Bursitis of left foot 11/11/2016  . Subluxation of tendon of long head of biceps 10/15/2016  . Leg cramping 10/15/2016  . Hamstring tendinitis of left thigh 07/26/2016  . Gout 07/26/2016  . Osteitis pubis ([place]) 01/27/2016  . Left knee pain 01/27/2016  . Neck tightness 08/12/2015  . Nonallopathic lesion of cervical region 08/12/2015  . Nonallopathic lesion of thoracic region 08/12/2015  . Nonallopathic lesion-rib cage 08/12/2015  . ACE-inhibitor cough 08/12/2015  . GERD (gastroesophageal reflux disease) 08/01/2014  . Neck pain 03/03/2014  . Hypertension 02/14/2014  . Allergic rhinitis 02/14/2014  . Herpes genitalis in men 02/14/2014  . Personal history of colonic polyps 05/06/2011  . Pes planus 04/30/2008    10:24 AM,08/03/17 Elly Modena PT, DPT [facility] [address] [place], [place], [address] Phone: [phone]   Fax:  [phone]  Name: Matthew [name] MRN: [id number] Date  of Birth: Jun 05, 1956

## 2017-08-08 ENCOUNTER — Ambulatory Visit: Payer: BLUE CROSS/BLUE SHIELD | Admitting: Physical Therapy

## 2017-08-08 ENCOUNTER — Other Ambulatory Visit: Payer: Self-pay | Admitting: *Deleted

## 2017-08-08 DIAGNOSIS — M6281 Muscle weakness (generalized): Secondary | ICD-10-CM

## 2017-08-08 DIAGNOSIS — M1712 Unilateral primary osteoarthritis, left knee: Secondary | ICD-10-CM

## 2017-08-08 DIAGNOSIS — M25562 Pain in left knee: Secondary | ICD-10-CM | POA: Diagnosis not present

## 2017-08-08 DIAGNOSIS — G8929 Other chronic pain: Secondary | ICD-10-CM

## 2017-08-08 NOTE — Therapy (Addendum)
[facility] [address] [place], [place], [address] Phone: [phone]   Fax:  [phone]  Physical Therapy Treatment/Discharge  Patient Details  Name: WILIAN KWONG MRN: [id number] Date of Birth: 1956-03-07 Referring Provider: Lyndal Pulley, DO  Encounter Date: 08/08/2017      PT End of Session - 08/08/17 1607    Visit Number 5   Date for PT Re-Evaluation 09/05/17   PT Start Time 0233   PT Stop Time 1600   PT Time Calculation (min) 44 min   Activity Tolerance Patient tolerated treatment well;No increased pain   Behavior During Therapy WFL for tasks assessed/performed      Past Medical History:  Diagnosis Date  . Allergy    RHINITIS  . Colonic polyp 09/2006   colonoscopy  . Hypertension     Past Surgical History:  Procedure Laterality Date  . COLONOSCOPY  09/2006  . LUMBAR DISC SURGERY  1990    There were no vitals filed for this visit.      Subjective Assessment - 08/08/17 1520    Subjective Pt reports that things are going ok. He tried doing some running last week on the treadmill, but could only take maybe 20 steps without increased Lt knee pain.    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 No/denies                         [facility] Adult PT Treatment/Exercise - 08/08/17 0001      Exercises   Exercises Other Exercises   Other Exercises  standing adductor stretch 2x30 sec each      Lumbar Exercises: Standing   Other Standing Lumbar Exercises standing hip adductor squeeze/slide 2x10 reps each  mirror feedback to correct technique    Other Standing Lumbar Exercises lateral step down from 4" box, 2x10 reps each LE, mirror feedback and green TB around knees to correct posture and decrease knee valgus deviation; BLE mini deadlift with 10# dumbbells x20 reps (last 10 reps with blue TB resistance to increase hip extensor activation)      Manual Therapy   Soft tissue mobilization STM/TPR Rt adductors                 PT Education - 08/08/17 1606    Education provided Yes   Education Details implications for exercises completed during session; discussed placing pt on hold to allow for him to reach out to his orthopedic MD regarding possible surgery to address Lt knee arthritis secondary to his goal for returning to high level activity    Person(s) Educated Patient   Methods Explanation;Verbal cues;Tactile cues   Comprehension Verbalized understanding;Returned demonstration          PT Short Term Goals - 07/19/17 4356      PT SHORT TERM GOAL #1   Title ind with initial HEP   Time 4   Period Weeks   Status On-going           PT Long Term Goals - 07/19/17 8616      PT LONG TERM GOAL #1   Title FOTO < or = to 35% limited   Time 8   Period Weeks   Status On-going     PT LONG TERM GOAL #2   Title independent with advanced HEP   Time 8   Period Weeks   Status On-going  PT LONG TERM GOAL #3   Title able to perform step down 10x from 6" step without hip drop for improved LE stability in activities   Time 8   Period Weeks   Status On-going     PT LONG TERM GOAL #4   Title able to lightly jog for 10 minute intervals without increased pain in order to return to normal activities.   Time 8   Period Weeks   Status On-going               Plan - 08/08/17 1608    Clinical Impression Statement Pt arrived today with frustrations after attempting to jog over the weekend. This resulted in swelling and pain in the Lt knee. At this time, due to his goal of returning to a higher level of sport and activity participation, he feels that he would benefit from possible surgical intervention. He will be placed on hold with PT until he is able to speak with his referring physician regarding possible next steps. Otherwise, focus of today was on improving posterior chain activation and improving hip  stability during single leg tasks. Pt required heavy cuing to address his valgus/Trendelenburg deviations noted on each LE during step downs and would benefit from continued skilled PT to further improve his flexibility, strength, proprioception and stability should he choose to return after speaking with his physician.    PT Treatment/Interventions ADLs/Self Care Home Management;Biofeedback;Electrical Stimulation;Iontophoresis 38m/ml Dexamethasone;Moist Heat;Patient/family education;Therapeutic activities;Therapeutic exercise;Balance training;Neuromuscular re-education;Passive range of motion;Manual techniques;Dry needling   PT Next Visit Plan f/u on pt plan to either have surgery, etc.; single leg step down, progress proximal hip strength   PT Home Exercise Plan foam rolling 5-10 min/day    Consulted and Agree with Plan of Care Patient      Patient will benefit from skilled therapeutic intervention in order to improve the following deficits and impairments:  Postural dysfunction, Impaired flexibility, Pain, Increased muscle spasms, Decreased strength, Decreased range of motion  Visit Diagnosis: Chronic pain of left knee  Muscle weakness (generalized)     Problem List Patient Active Problem List   Diagnosis Date Noted  . Degenerative arthritis of left knee 03/22/2017  . Sleep deficient 03/22/2017  . Acute meniscal tear of knee, left, initial encounter 12/09/2016  . Bursitis of left foot 11/11/2016  . Subluxation of tendon of long head of biceps 10/15/2016  . Leg cramping 10/15/2016  . Hamstring tendinitis of left thigh 07/26/2016  . Gout 07/26/2016  . Osteitis pubis ([place] 01/27/2016  . Left knee pain 01/27/2016  . Neck tightness 08/12/2015  . Nonallopathic lesion of cervical region 08/12/2015  . Nonallopathic lesion of thoracic region 08/12/2015  . Nonallopathic lesion-rib cage 08/12/2015  . ACE-inhibitor cough 08/12/2015  . GERD (gastroesophageal reflux disease) 08/01/2014  .  Neck pain 03/03/2014  . Hypertension 02/14/2014  . Allergic rhinitis 02/14/2014  . Herpes genitalis in men 02/14/2014  . Personal history of colonic polyps 05/06/2011  . Pes planus 04/30/2008    4:14 PM,08/08/17 SElly ModenaPT, DPT [facility] [address] [place] [address]Phone: [phone]  Fax:  [phone] Name: PMAXTON NOREENMRN: [id number] of Birth: 2May 28, 1957   *Addendum to d/c pt from therapy and resolve episode of care  PHYSICAL THERAPY DISCHARGE SUMMARY  Visits from Start of Care: 5  Current functional level related to goals / functional outcomes:  See above for more details    Remaining deficits: See above for more details    Education / Equipment: See above for more details  Plan: Patient agrees to discharge.  Patient goals were partially met. Patient is being discharged due to not returning since the last visit.  ?????     2:08 PM,10/12/17 Elly Modena PT, [place] at [place]

## 2017-08-09 ENCOUNTER — Encounter: Payer: Self-pay | Admitting: *Deleted

## 2017-08-25 ENCOUNTER — Other Ambulatory Visit: Payer: Self-pay | Admitting: Family Medicine

## 2017-08-25 MED ORDER — TRAZODONE HCL 50 MG PO TABS: 25.0000 mg | ORAL_TABLET | Freq: Every evening | ORAL | 3 refills | Status: DC | PRN

## 2017-09-02 ENCOUNTER — Other Ambulatory Visit: Payer: Self-pay | Admitting: Family Medicine

## 2017-09-07 ENCOUNTER — Other Ambulatory Visit: Payer: Self-pay | Admitting: Family Medicine

## 2017-09-07 NOTE — Telephone Encounter (Signed)
Pt has appt scheduled for CPE in Jan due to traveling and holidays and knee surgery in Jan.

## 2017-10-02 ENCOUNTER — Other Ambulatory Visit: Payer: Self-pay | Admitting: Family Medicine

## 2017-10-02 MED ORDER — TRAMADOL HCL 50 MG PO TABS: 50.0000 mg | ORAL_TABLET | Freq: Three times a day (TID) | ORAL | 0 refills | Status: DC | PRN

## 2017-10-02 MED ORDER — PREDNISONE 50 MG PO TABS: 50.0000 mg | ORAL_TABLET | Freq: Every day | ORAL | 0 refills | Status: DC

## 2017-10-03 NOTE — Progress Notes (Signed)
[name] [address], [place] [address] Phone: [phone] Subjective:      CC: Left knee pain  NIO:[id number]  Matthew [name] is a 61 y.o. male coming in with complaint of severe left knee pain.  Patient has had severe bone-on-bone medial compartment arthritis.  Had been doing fairly well with conservative therapy.  Attempted Visco supplementation in July of this year.  Unfortunately worsening symptoms.  Patient is scheduled for replacement in January but is having worsening discomfort and pain now.  Patient states she started to develop them.  Trying to wear the brace more but states that the swelling is because it not fitting right.        Past Medical History:  Diagnosis Date  . Allergy    RHINITIS  . Colonic polyp 09/2006   colonoscopy  . Hypertension    Past Surgical History:  Procedure Laterality Date  . COLONOSCOPY  09/2006  . [place]   Social History   Socioeconomic History  . Marital status: Married    Spouse name: Not on file  . Number of children: Not on file  . Years of education: Not on file  . Highest education level: Not on file  Social Needs  . Financial resource strain: Not on file  . Food insecurity - worry: Not on file  . Food insecurity - inability: Not on file  . Transportation needs - medical: Not on file  . Transportation needs - non-medical: Not on file  Occupational History  . Not on file  Tobacco Use  . Smoking status: Current Some Day Smoker    Types: Cigars  . Smokeless tobacco: Never Used  Substance and Sexual Activity  . Alcohol use: Yes    Alcohol/week: 1.5 oz    Types: 3 drink(s) per week  . Drug use: No  . Sexual activity: Yes  Other Topics Concern  . Not on file  Social History Narrative  . Not on file   Allergies  Allergen Reactions  . Demerol    No family history on file.   Past medical history, social, surgical and family history all reviewed in  electronic medical record.  No pertanent information unless stated regarding to the chief complaint.   Review of Systems:Review of systems updated and as accurate as of 10/03/17  No headache, visual changes, nausea, vomiting, diarrhea, constipation, dizziness, abdominal pain, skin rash, fevers, chills, night sweats, weight loss, swollen lymph nodes, body aches, joint swelling, muscle aches, chest pain, shortness of breath, mood changes.   Objective  There were no vitals taken for this visit. Systems examined below as of 10/03/17   General: No apparent distress alert and oriented x3 mood and affect normal, dressed appropriately.  HEENT: Pupils equal, extraocular movements intact  Respiratory: Patient's speak in full sentences and does not appear short of breath  Cardiovascular: No lower extremity edema, non tender, no erythema  Skin: Warm dry intact with no signs of infection or rash on extremities or on axial skeleton.  Abdomen: Soft nontender  Neuro: Cranial nerves II through XII are intact, neurovascularly intact in all extremities with 2+ DTRs and 2+ pulses.  Lymph: No lymphadenopathy of posterior or anterior cervical chain or axillae bilaterally.  Gait normal with good balance and coordination.  MSK:  Non tender with full range of motion and good stability and symmetric strength and tone of shoulders, elbows, wrist, hip, and ankles bilaterally.  Knee: Left valgus deformity  noted.  Abnormal thigh to calf ratio.  Tender to palpation over medial and PF joint line.  ROM full in flexion and extension and lower leg rotation. instability with valgus force.  painful patellar compression. Patellar glide with moderate crepitus. Patellar and quadriceps tendons unremarkable. Hamstring and quadriceps strength is normal. Contralateral knee shows minimal arthritic changes  After informed written and verbal consent, patient was seated on exam table. Left knee was prepped with alcohol swab and  utilizing anterolateral approach, patient's left knee space was injected with 4:1  marcaine 0.5%: Kenalog 40mg /dL. Patient tolerated the procedure well without immediate complications.    Impression and Recommendations:     This case required medical decision making of moderate complexity.      Note: This dictation was prepared with Dragon dictation along with smaller phrase technology. Any transcriptional errors that result from this process are unintentional.

## 2017-10-04 ENCOUNTER — Encounter: Payer: Self-pay | Admitting: Family Medicine

## 2017-10-04 ENCOUNTER — Ambulatory Visit (INDEPENDENT_AMBULATORY_CARE_PROVIDER_SITE_OTHER): Payer: BLUE CROSS/BLUE SHIELD | Admitting: Family Medicine

## 2017-10-04 DIAGNOSIS — M1712 Unilateral primary osteoarthritis, left knee: Secondary | ICD-10-CM

## 2017-10-04 MED ORDER — PREDNISONE 50 MG PO TABS: 50.0000 mg | ORAL_TABLET | Freq: Every day | ORAL | 0 refills | Status: DC

## 2017-10-04 NOTE — Patient Instructions (Addendum)
You are in good hands with [facility] Matthew [name] is your friend Refilled prednisone in case you need it between now and the surgery  YOu know where I am if you need me

## 2017-10-04 NOTE — Assessment & Plan Note (Signed)
Repeat injection again today.  Failed all conservative therapy and is scheduled for surgery next month.  We discussed icing regimen, encouraged the possibility of prednisone if no significant improvement.  Patient will continue to be active otherwise.  Follow-up with me again in 4-6 weeks

## 2017-10-17 ENCOUNTER — Other Ambulatory Visit: Payer: Self-pay | Admitting: Family Medicine

## 2017-10-26 ENCOUNTER — Other Ambulatory Visit: Payer: Self-pay | Admitting: Orthopedic Surgery

## 2017-10-29 ENCOUNTER — Other Ambulatory Visit: Payer: Self-pay | Admitting: Family Medicine

## 2017-11-01 ENCOUNTER — Encounter (HOSPITAL_COMMUNITY)
Admission: RE | Admit: 2017-11-01 | Discharge: 2017-11-01 | Disposition: A | Discharge status: Home / Self Care | Payer: 59 | Source: Ambulatory Visit | Attending: Orthopedic Surgery | Admitting: Orthopedic Surgery

## 2017-11-01 ENCOUNTER — Ambulatory Visit (HOSPITAL_COMMUNITY)
Admission: RE | Admit: 2017-11-01 | Discharge: 2017-11-01 | Disposition: A | Discharge status: Home / Self Care | Payer: 59 | Source: Ambulatory Visit | Attending: Orthopedic Surgery | Admitting: Orthopedic Surgery

## 2017-11-01 ENCOUNTER — Other Ambulatory Visit: Payer: Self-pay

## 2017-11-01 ENCOUNTER — Encounter (HOSPITAL_COMMUNITY): Payer: Self-pay

## 2017-11-01 DIAGNOSIS — M1712 Unilateral primary osteoarthritis, left knee: Secondary | ICD-10-CM | POA: Diagnosis not present

## 2017-11-01 DIAGNOSIS — R9431 Abnormal electrocardiogram [ECG] [EKG]: Secondary | ICD-10-CM | POA: Diagnosis not present

## 2017-11-01 DIAGNOSIS — Z01818 Encounter for other preprocedural examination: Secondary | ICD-10-CM | POA: Insufficient documentation

## 2017-11-01 HISTORY — DX: Unspecified osteoarthritis, unspecified site: M19.90

## 2017-11-01 HISTORY — DX: Gastro-esophageal reflux disease without esophagitis: K21.9

## 2017-11-01 HISTORY — DX: Anemia, unspecified: D64.9

## 2017-11-01 LAB — TYPE AND SCREEN
ABO/RH(D): O POS
Antibody Screen: NEGATIVE

## 2017-11-01 LAB — CBC WITH DIFFERENTIAL/PLATELET
BASOS ABS: 0 10*3/uL (ref 0.0–0.1)
BASOS PCT: 1 %
Eosinophils Absolute: 0.3 10*3/uL (ref 0.0–0.7)
Eosinophils Relative: 4 %
HEMATOCRIT: 42 % (ref 39.0–52.0)
HEMOGLOBIN: 14.3 g/dL (ref 13.0–17.0)
Lymphocytes Relative: 24 %
Lymphs Abs: 1.6 10*3/uL (ref 0.7–4.0)
MCH: 32 pg (ref 26.0–34.0)
MCHC: 34 g/dL (ref 30.0–36.0)
MCV: 94 fL (ref 78.0–100.0)
MONOS PCT: 12 %
Monocytes Absolute: 0.8 10*3/uL (ref 0.1–1.0)
NEUTROS ABS: 4 10*3/uL (ref 1.7–7.7)
NEUTROS PCT: 59 %
Platelets: 330 10*3/uL (ref 150–400)
RBC: 4.47 MIL/uL (ref 4.22–5.81)
RDW: 12.3 % (ref 11.5–15.5)
WBC: 6.7 10*3/uL (ref 4.0–10.5)

## 2017-11-01 LAB — COMPREHENSIVE METABOLIC PANEL
ALBUMIN: 4.9 g/dL (ref 3.5–5.0)
ALK PHOS: 57 U/L (ref 38–126)
ALT: 31 U/L (ref 17–63)
AST: 33 U/L (ref 15–41)
Anion gap: 9 (ref 5–15)
BILIRUBIN TOTAL: 0.8 mg/dL (ref 0.3–1.2)
BUN: 11 mg/dL (ref 6–20)
CALCIUM: 9.9 mg/dL (ref 8.9–10.3)
CO2: 28 mmol/L (ref 22–32)
Chloride: 99 mmol/L — ABNORMAL LOW (ref 101–111)
Creatinine, Ser: 0.93 mg/dL (ref 0.61–1.24)
GFR calc Af Amer: >60 mL/min (ref >60)
GFR calc non Af Amer: >60 mL/min (ref >60)
GLUCOSE: 93 mg/dL (ref 65–99)
Potassium: 3.5 mmol/L (ref 3.5–5.1)
Sodium: 136 mmol/L (ref 135–145)
TOTAL PROTEIN: 8.4 g/dL — AB (ref 6.5–8.1)

## 2017-11-01 LAB — URINALYSIS, ROUTINE W REFLEX MICROSCOPIC
BILIRUBIN URINE: NEGATIVE
Bacteria, UA: NONE SEEN
GLUCOSE, UA: NEGATIVE mg/dL
HGB URINE DIPSTICK: NEGATIVE
Ketones, ur: NEGATIVE mg/dL
Leukocytes, UA: NEGATIVE
NITRITE: NEGATIVE
Protein, ur: NEGATIVE mg/dL
Specific Gravity, Urine: 1.011 (ref 1.005–1.030)
Squamous Epithelial / LPF: NONE SEEN
pH: 7 (ref 5.0–8.0)

## 2017-11-01 LAB — SURGICAL PCR SCREEN
MRSA, PCR: NEGATIVE
STAPHYLOCOCCUS AUREUS: POSITIVE — AB

## 2017-11-01 LAB — PROTIME-INR
INR: 0.94
Prothrombin Time: 12.4 seconds (ref 11.4–15.2)

## 2017-11-01 LAB — ABO/RH: ABO/RH(D): O POS

## 2017-11-01 LAB — APTT: APTT: 31 s (ref 24–36)

## 2017-11-01 NOTE — Pre-Procedure Instructions (Signed)
Matthew [name]  11/01/2017      [id number] [name], [place] [address] Phone: [phone] Fax: [phone]    Your procedure is scheduled on  Jan 7  Report to [facility] at [phone]   Call this number if you have problems the morning of surgery:  [phone]   Remember:  Do not eat food or drink liquids after midnight.  Take these medicines the morning of surgery with A SIP OF WATER Allopurinol (Zyloprim),Cetirizine (Zyrtec), Esomeprazole (Nexium) if needed, Tramadol (Ultram) if needed  Stop taking aspirin as directed by your DR.   Stop taking BC's, Goody's, Herbal medications, Fish Oil, Ibuprofen, Advil, Motrin, Aleve, meloxicam ,(Mobic ) Tumeric,   Do not wear jewelry, make-up or nail polish.  Do not wear lotions, powders, or perfumes, or deodorant.  Do not shave 48 hours prior to surgery.  Men may shave face and neck.  Do not bring valuables to the hospital.  [facility] is not responsible for any belongings or valuables.  Contacts, dentures or bridgework may not be worn into surgery.  Leave your suitcase in the car.  After surgery it may be brought to your room.  For patients admitted to the hospital, discharge time will be determined by your treatment team.  Patients discharged the day of surgery will not be allowed to drive home.   Special instructions:  [facility] - Preparing for Surgery  Before surgery, you can play an important role.  Because skin is not sterile, your skin needs to be as free of germs as possible.  You can reduce the number of germs on you skin by washing with CHG (chlorahexidine gluconate) soap before surgery.  CHG is an antiseptic cleaner which kills germs and bonds with the skin to continue killing germs even after washing.  Please DO NOT use if you have an allergy to CHG or antibacterial soaps.  If your skin becomes reddened/irritated stop using the CHG and  inform your nurse when you arrive at Short Stay.  Do not shave (including legs and underarms) for at least 48 hours prior to the first CHG shower.  You may shave your face.  Please follow these instructions carefully:   1.  Shower with CHG Soap the night before surgery and the morning of Surgery.  2.  If you choose to wash your hair, wash your hair first as usual with your  normal shampoo.  3.  After you shampoo, rinse your hair and body thoroughly to remove the  Shampoo.  4.  Use CHG as you would any other liquid soap.  You can apply chg directly  to the skin and wash gently with scrungie or a clean washcloth.  5.  Apply the CHG Soap to your body ONLY FROM THE NECK DOWN.  Do not use on open wounds or open sores.  Avoid contact with your eyes,       ears, mouth and genitals (private parts).  Wash genitals (private parts)  with your normal soap.  6.  Wash thoroughly, paying special attention to the area where your surgery will be performed.  7.  Thoroughly rinse your body with warm water from the neck down.  8.  DO NOT shower/wash with your normal soap after using and rinsing off   the CHG Soap.  9.  Pat yourself dry with a clean towel.  10.  Wear clean pajamas.            11.  Place clean sheets on your bed the night of your first shower and do not  sleep with pets.  Day of Surgery  Do not apply any lotions/deoderants the morning of surgery.  Please wear clean clothes to the hospital/surgery center.     Please read over the  fact sheets that you were given.

## 2017-11-01 NOTE — Pre-Procedure Instructions (Signed)
Matthew [name]  11/01/2017      [id number] Matthew [name], Matthew [name] [place] Matthew [name] Matthew [name] [address] Phone: [phone] Fax: [phone]    Your procedure is scheduled on  Jan 7  Report to [facility] at [phone]   Call this number if you have problems the morning of surgery:  [phone]   Remember:  Do not eat food or drink liquids after midnight.  Take these medicines the morning of surgery with A SIP OF WATER Allopurinol (Zyloprim),Cetirizine (Zyrtec), Esomeprazole (Nexium) if needed, Tramadol (Ultram) if needed  Stop taking aspirin as directed by your DR.   Stop taking BC's, Goody's, Herbal medications, Fish Oil, Ibuprofen, Advil, Motrin, Aleve, meloxicam ,(Mobic ) Tumeric,   Do not wear jewelry, make-up or nail polish.  Do not wear lotions, powders, or perfumes, or deodorant.  Do not shave 48 hours prior to surgery.  Men may shave face and neck.  Do not bring valuables to the hospital.  [facility] is not responsible for any belongings or valuables.  Contacts, dentures or bridgework may not be worn into surgery.  Leave your suitcase in the car.  After surgery it may be brought to your room.  For patients admitted to the hospital, discharge time will be determined by your treatment team.  Patients discharged the day of surgery will not be allowed to drive home.   Special instructions:  [facility] - Preparing for Surgery  Before surgery, you can play an important role.  Because skin is not sterile, your skin needs to be as free of germs as possible.  You can reduce the number of germs on you skin by washing with CHG (chlorahexidine gluconate) soap before surgery.  CHG is an antiseptic cleaner which kills germs and bonds with the skin to continue killing germs even after washing.  Please DO NOT use if you have an allergy to CHG or antibacterial soaps.  If your skin becomes reddened/irritated stop using the CHG and  inform your nurse when you arrive at Short Stay.  Do not shave (including legs and underarms) for at least 48 hours prior to the first CHG shower.  You may shave your face.  Please follow these instructions carefully:   1.  Shower with CHG Soap the night before surgery and the morning of Surgery.  2.  If you choose to wash your hair, wash your hair first as usual with your  normal shampoo.  3.  After you shampoo, rinse your hair and body thoroughly to remove the  Shampoo.  4.  Use CHG as you would any other liquid soap.  You can apply chg directly  to the skin and wash gently with scrungie or a clean washcloth.  5.  Apply the CHG Soap to your body ONLY FROM THE NECK DOWN.  Do not use on open wounds or open sores.  Avoid contact with your eyes,       ears, mouth and genitals (private parts).  Wash genitals (private parts)  with your normal soap.  6.  Wash thoroughly, paying special attention to the area where your surgery will be performed.  7.  Thoroughly rinse your body with warm water from the neck down.  8.  DO NOT shower/wash with your normal soap after using and rinsing off   the CHG Soap.  9.  Pat yourself dry with a clean towel.  10.  Wear clean pajamas.            11.  Place clean sheets on your bed the night of your first shower and do not  sleep with pets.  Day of Surgery  Do not apply any lotions/deoderants the morning of surgery.  Please wear clean clothes to the hospital/surgery center.     Please read over the  fact sheets that you were given.

## 2017-11-01 NOTE — Progress Notes (Signed)
[facility] [address] [facility] of Staph. Left message on pt's voicemail informing him of results and need to pick up Rx tonight and start it tonight.

## 2017-11-01 NOTE — Pre-Procedure Instructions (Signed)
Matthew [name]  11/01/2017      [id number] [name], [place] [address] Phone: [phone] Fax: [phone]    Your procedure is scheduled on  Jan 7  Report to [facility] at [phone]   Call this number if you have problems the morning of surgery:  [phone]   Remember:  Do not eat food or drink liquids after midnight.  Take these medicines the morning of surgery with A SIP OF WATER Allopurinol (Zyloprim), Artificial tears if needed, Cetirizine (Zyrtec), Esomeprazole (Nexium) if needed, Tramadol (Ultram) if needed  Stop taking aspirin as directed by your DR.   Stop taking BC's, Goody's, Herbal medications, Fish Oil, Ibuprofen, Advil, Motrin, Aleve, Mobic    Do not wear jewelry, make-up or nail polish.  Do not wear lotions, powders, or perfumes, or deodorant.  Do not shave 48 hours prior to surgery.  Men may shave face and neck.  Do not bring valuables to the hospital.  [facility] is not responsible for any belongings or valuables.  Contacts, dentures or bridgework may not be worn into surgery.  Leave your suitcase in the car.  After surgery it may be brought to your room.  For patients admitted to the hospital, discharge time will be determined by your treatment team.  Patients discharged the day of surgery will not be allowed to drive home.   Special instructions:  [facility] - Preparing for Surgery  Before surgery, you can play an important role.  Because skin is not sterile, your skin needs to be as free of germs as possible.  You can reduce the number of germs on you skin by washing with CHG (chlorahexidine gluconate) soap before surgery.  CHG is an antiseptic cleaner which kills germs and bonds with the skin to continue killing germs even after washing.  Please DO NOT use if you have an allergy to CHG or antibacterial soaps.  If your skin becomes reddened/irritated stop using the CHG  and inform your nurse when you arrive at Short Stay.  Do not shave (including legs and underarms) for at least 48 hours prior to the first CHG shower.  You may shave your face.  Please follow these instructions carefully:   1.  Shower with CHG Soap the night before surgery and the morning of Surgery.  2.  If you choose to wash your hair, wash your hair first as usual with your  normal shampoo.  3.  After you shampoo, rinse your hair and body thoroughly to remove the  Shampoo.  4.  Use CHG as you would any other liquid soap.  You can apply chg directly  to the skin and wash gently with scrungie or a clean washcloth.  5.  Apply the CHG Soap to your body ONLY FROM THE NECK DOWN.  Do not use on open wounds or open sores.  Avoid contact with your eyes,       ears, mouth and genitals (private parts).  Wash genitals (private parts)  with your normal soap.  6.  Wash thoroughly, paying special attention to the area where your surgery will be performed.  7.  Thoroughly rinse your body with warm water from the neck down.  8.  DO NOT shower/wash with your normal soap after using and rinsing off   the CHG Soap.  9.  Pat yourself dry with a clean towel.  10.  Wear clean pajamas.            11.  Place clean sheets on your bed the night of your first shower and do not  sleep with pets.  Day of Surgery  Do not apply any lotions/deoderants the morning of surgery.  Please wear clean clothes to the hospital/surgery center.     Please read over the following fact sheets that you were given. Pain Booklet, Coughing and Deep Breathing, MRSA Information and Surgical Site Infection Prevention

## 2017-11-02 ENCOUNTER — Ambulatory Visit (INDEPENDENT_AMBULATORY_CARE_PROVIDER_SITE_OTHER): Payer: 59 | Admitting: Family Medicine

## 2017-11-02 ENCOUNTER — Encounter: Payer: Self-pay | Admitting: Family Medicine

## 2017-11-02 VITALS — BP 126/70 | HR 70 | Resp 16 | Ht 72.0 in | Wt 202.6 lb

## 2017-11-02 DIAGNOSIS — Z72820 Sleep deprivation: Secondary | ICD-10-CM | POA: Diagnosis not present

## 2017-11-02 DIAGNOSIS — J301 Allergic rhinitis due to pollen: Secondary | ICD-10-CM | POA: Diagnosis not present

## 2017-11-02 DIAGNOSIS — Z125 Encounter for screening for malignant neoplasm of prostate: Secondary | ICD-10-CM

## 2017-11-02 DIAGNOSIS — M109 Gout, unspecified: Secondary | ICD-10-CM | POA: Diagnosis not present

## 2017-11-02 DIAGNOSIS — M6740 Ganglion, unspecified site: Secondary | ICD-10-CM | POA: Diagnosis not present

## 2017-11-02 DIAGNOSIS — Z0000 Encounter for general adult medical examination without abnormal findings: Secondary | ICD-10-CM

## 2017-11-02 DIAGNOSIS — L570 Actinic keratosis: Secondary | ICD-10-CM

## 2017-11-02 DIAGNOSIS — Z86010 Personal history of colonic polyps: Secondary | ICD-10-CM | POA: Diagnosis not present

## 2017-11-02 DIAGNOSIS — M1712 Unilateral primary osteoarthritis, left knee: Secondary | ICD-10-CM | POA: Diagnosis not present

## 2017-11-02 DIAGNOSIS — Z1159 Encounter for screening for other viral diseases: Secondary | ICD-10-CM

## 2017-11-02 DIAGNOSIS — Z23 Encounter for immunization: Secondary | ICD-10-CM | POA: Diagnosis not present

## 2017-11-02 DIAGNOSIS — D171 Benign lipomatous neoplasm of skin and subcutaneous tissue of trunk: Secondary | ICD-10-CM | POA: Diagnosis not present

## 2017-11-02 HISTORY — DX: Ganglion, unspecified site: M67.40

## 2017-11-02 LAB — POCT URINALYSIS DIP (PROADVANTAGE DEVICE)
BILIRUBIN UA: NEGATIVE
BILIRUBIN UA: NEGATIVE mg/dL
Blood, UA: NEGATIVE
Glucose, UA: NEGATIVE mg/dL
Leukocytes, UA: NEGATIVE
Nitrite, UA: NEGATIVE
PROTEIN UA: NEGATIVE mg/dL
SPECIFIC GRAVITY, URINE: 1.015
Urobilinogen, Ur: NEGATIVE
pH, UA: 7 (ref 5.0–8.0)

## 2017-11-02 NOTE — Progress Notes (Signed)
Subjective:    Patient ID: Matthew [name], male    DOB: 1956-05-06, 62 y.o.   MRN: [id number]  HPI He is here for complete examination.  He is planning on having a left TKR done this coming Monday.  He continues on tramadol to help with that.  He was also given gabapentin and trazodone.  Trazodone was to help with sleep and the gabapentin was supposed to help with the knee pain from the arthritis.  He also would like to be seen by dermatology for treatment of scalp actinic keratoses.  He has a lesion in his left flank area that he would like evaluated.  He also has lesions on his hands that are starting to give him some difficulty.  He has underlying gout and continues on allopurinol.  He also has history of colonic polyps and is in need of follow-up colonoscopy.  His allergies seem to be under good control.  He does complain of reflux symptoms but really discusses more clearing of the throat rather than true acid reflux symptoms.  No sneezing, itchy watery eyes. Family and social history as well as health maintenance and immunizations was reviewed   Review of Systems  All other systems reviewed and are negative.      Objective:   Physical Exam BP 126/70   Pulse 70   Resp 16   Ht 6' (1.829 m)   Wt 202 lb 9.6 oz (91.9 kg)   SpO2 98%   BMI 27.48 kg/m   General Appearance:    Alert, cooperative, no distress, appears stated age  Head:    Normocephalic, without obvious abnormality, atraumatic.  Multiple slightly erythematous scaly lesions are noted on the scalp.  Eyes:    PERRL, conjunctiva/corneas clear, EOM's intact, fundi    benign  Ears:    Normal TM's and external ear canals  Nose:   Nares normal, mucosa normal, no drainage or sinus   tenderness  Throat:   Lips, mucosa, and tongue normal; teeth and gums normal  Neck:   Supple, no lymphadenopathy;  thyroid:  no   enlargement/tenderness/nodules; no carotid   bruit or JVD     Lungs:     Clear to auscultation bilaterally without  wheezes, rales or     ronchi; respirations unlabored      Heart:    Regular rate and rhythm, S1 and S2 normal, no murmur, rub   or gallop     Abdomen:     Soft, non-tender, nondistended, normoactive bowel sounds, 3 cm round smooth movable lesion is noted in the left leg area.   no masses, no hepatosplenomegaly  Genitalia:   Deferred  Rectal:   Deferred  Extremities:   No clubbing, cyanosis or edema.  Several 1-1/2 cm round smooth lesions are noted on the palm of the hands.  Pulses:   2+ and symmetric all extremities  Skin:   Skin color, texture, turgor normal, no rashes or lesions  Lymph nodes:   Cervical, supraclavicular, and axillary nodes normal  Neurologic:   CNII-XII intact, normal strength, sensation and gait; reflexes 2+ and symmetric throughout          Psych:   Normal mood, affect, hygiene and grooming.           Assessment & Plan:  Routine general medical examination at a health care facility - Plan: POCT Urinalysis DIP (Proadvantage Device), Lipid panel  Screening for prostate cancer - Plan: PSA  Need for shingles vaccine - Plan: Varicella-zoster  vaccine IM (Shingrix)  Primary osteoarthritis of left knee  Sleep deficient  Allergic rhinitis due to pollen, unspecified seasonality  Gout, unspecified cause, unspecified chronicity, unspecified site - Plan: Uric Acid  Personal history of colonic polyps - Plan: Ambulatory referral to Gastroenterology  Encounter for hepatitis C screening test for low risk patient - Plan: Hepatitis C antibody  Lipoma of torso  Ganglion  Actinic keratosis He is ready to have the surgery and does plan to rehab this quite extensively. I then discussed the medicines that he is using for sleep and pain.  Encouraged him to try to avoid them if at all possible especially postoperatively as all of them can have CNS effects and he will probably be given pain management.  He will keep in touch with me concerning this.  I do not think he has  reflux disease but more Of a clearing of the throat/allergy type symptoms and recommended trying an antihistamine. Explained that the lipoma is of no concern and no intervention needed. Discussed the issue with the hands and until he has difficulty with gripping things, would not recommend further intervention on that. He will be referred to dermatology for care of his actinic keratosis. I also discussed in detail PSA testing.  He would like to go ahead and get this done.

## 2017-11-03 LAB — LIPID PANEL
CHOL/HDL RATIO: 2.9 (calc) (ref <5.0)
Cholesterol: 208 mg/dL — ABNORMAL HIGH (ref <200)
HDL: 71 mg/dL (ref >40)
LDL Cholesterol (Calc): 117 mg/dL (calc) — ABNORMAL HIGH
Non-HDL Cholesterol (Calc): 137 mg/dL (calc) — ABNORMAL HIGH (ref <130)
TRIGLYCERIDES: 96 mg/dL (ref <150)

## 2017-11-03 LAB — URIC ACID: URIC ACID, SERUM: 4.7 mg/dL (ref 4.0–8.0)

## 2017-11-03 LAB — PSA: PSA: 0.7 ng/mL (ref <=4.0)

## 2017-11-03 LAB — HEPATITIS C ANTIBODY
HEP C AB: NONREACTIVE
SIGNAL TO CUT-OFF: 0.01 (ref <1.00)

## 2017-11-03 MED ORDER — ALLOPURINOL 300 MG PO TABS: 300.0000 mg | ORAL_TABLET | Freq: Every day | ORAL | 3 refills | Status: DC

## 2017-11-03 MED ORDER — LOSARTAN POTASSIUM-HCTZ 50-12.5 MG PO TABS: 1.0000 | ORAL_TABLET | Freq: Every day | ORAL | 3 refills | Status: DC

## 2017-11-03 MED ORDER — CEFAZOLIN SODIUM-DEXTROSE 2-4 GM/100ML-% IV SOLN
2.0000 g | INTRAVENOUS | Status: AC
  Administered 2017-11-06: 2 g via INTRAVENOUS
  Filled 2017-11-03: qty 100

## 2017-11-03 NOTE — Addendum Note (Signed)
Addended by: Denita Lung on: 11/03/2017 08:17 AM   Modules accepted: Orders

## 2017-11-06 ENCOUNTER — Encounter (HOSPITAL_COMMUNITY)
Admission: RE | Disposition: A | Discharge status: Home Health | Payer: Self-pay | Source: Ambulatory Visit | Attending: Orthopedic Surgery

## 2017-11-06 ENCOUNTER — Inpatient Hospital Stay (HOSPITAL_COMMUNITY): Payer: 59 | Admitting: Anesthesiology

## 2017-11-06 ENCOUNTER — Inpatient Hospital Stay (HOSPITAL_COMMUNITY)
Admission: RE | Admit: 2017-11-06 | Discharge: 2017-11-07 | DRG: 470 | Disposition: A | Discharge status: Home Health | Payer: 59 | Source: Ambulatory Visit | Attending: Orthopedic Surgery | Admitting: Orthopedic Surgery

## 2017-11-06 ENCOUNTER — Encounter (HOSPITAL_COMMUNITY): Payer: Self-pay | Admitting: Anesthesiology

## 2017-11-06 DIAGNOSIS — M1712 Unilateral primary osteoarthritis, left knee: Principal | ICD-10-CM | POA: Diagnosis present

## 2017-11-06 DIAGNOSIS — M25562 Pain in left knee: Secondary | ICD-10-CM | POA: Diagnosis not present

## 2017-11-06 DIAGNOSIS — F17290 Nicotine dependence, other tobacco product, uncomplicated: Secondary | ICD-10-CM | POA: Diagnosis present

## 2017-11-06 DIAGNOSIS — I10 Essential (primary) hypertension: Secondary | ICD-10-CM | POA: Diagnosis present

## 2017-11-06 DIAGNOSIS — G8918 Other acute postprocedural pain: Secondary | ICD-10-CM | POA: Diagnosis not present

## 2017-11-06 DIAGNOSIS — Z86010 Personal history of colonic polyps: Secondary | ICD-10-CM

## 2017-11-06 DIAGNOSIS — A6002 Herpesviral infection of other male genital organs: Secondary | ICD-10-CM | POA: Diagnosis present

## 2017-11-06 DIAGNOSIS — Z79899 Other long term (current) drug therapy: Secondary | ICD-10-CM | POA: Diagnosis not present

## 2017-11-06 DIAGNOSIS — Z888 Allergy status to other drugs, medicaments and biological substances status: Secondary | ICD-10-CM

## 2017-11-06 DIAGNOSIS — Z7982 Long term (current) use of aspirin: Secondary | ICD-10-CM | POA: Diagnosis not present

## 2017-11-06 DIAGNOSIS — K219 Gastro-esophageal reflux disease without esophagitis: Secondary | ICD-10-CM | POA: Diagnosis not present

## 2017-11-06 DIAGNOSIS — Z96652 Presence of left artificial knee joint: Secondary | ICD-10-CM | POA: Diagnosis not present

## 2017-11-06 HISTORY — DX: Unilateral primary osteoarthritis, left knee: M17.12

## 2017-11-06 HISTORY — PX: TOTAL KNEE ARTHROPLASTY: SHX125

## 2017-11-06 SURGERY — ARTHROPLASTY, KNEE, TOTAL
Anesthesia: Spinal | Site: Knee | Laterality: Left

## 2017-11-06 MED ORDER — SODIUM CHLORIDE 0.9 % IR SOLN
Status: DC | PRN
  Administered 2017-11-06: 3000 mL

## 2017-11-06 MED ORDER — MIDAZOLAM HCL 2 MG/2ML IJ SOLN
2.0000 mg | Freq: Once | INTRAMUSCULAR | Status: AC
  Administered 2017-11-06: 2 mg via INTRAVENOUS

## 2017-11-06 MED ORDER — ASPIRIN EC 325 MG PO TBEC: 325.0000 mg | DELAYED_RELEASE_TABLET | Freq: Two times a day (BID) | ORAL | 0 refills | Status: DC

## 2017-11-06 MED ORDER — BUPIVACAINE IN DEXTROSE 0.75-8.25 % IT SOLN
INTRATHECAL | Status: DC | PRN
  Administered 2017-11-06: 2 mL via INTRATHECAL

## 2017-11-06 MED ORDER — MIDAZOLAM HCL 2 MG/2ML IJ SOLN
INTRAMUSCULAR | Status: AC
  Administered 2017-11-06: 2 mg via INTRAVENOUS
  Filled 2017-11-06: qty 2

## 2017-11-06 MED ORDER — OXYCODONE HCL 5 MG PO TABS
5.0000 mg | ORAL_TABLET | ORAL | Status: DC | PRN
  Administered 2017-11-06 – 2017-11-07 (×4): 10 mg via ORAL
  Filled 2017-11-06 (×4): qty 2

## 2017-11-06 MED ORDER — FENTANYL CITRATE (PF) 100 MCG/2ML IJ SOLN
100.0000 ug | Freq: Once | INTRAMUSCULAR | Status: AC
  Administered 2017-11-06: 100 ug via INTRAVENOUS

## 2017-11-06 MED ORDER — FENTANYL CITRATE (PF) 100 MCG/2ML IJ SOLN
INTRAMUSCULAR | Status: AC
  Administered 2017-11-06: 100 ug via INTRAVENOUS
  Filled 2017-11-06: qty 2

## 2017-11-06 MED ORDER — METHOCARBAMOL 1000 MG/10ML IJ SOLN
500.0000 mg | Freq: Four times a day (QID) | INTRAVENOUS | Status: DC | PRN
  Filled 2017-11-06: qty 5

## 2017-11-06 MED ORDER — TRANEXAMIC ACID 1000 MG/10ML IV SOLN
1000.0000 mg | Freq: Once | INTRAVENOUS | Status: AC
  Administered 2017-11-06: 1000 mg via INTRAVENOUS
  Filled 2017-11-06: qty 10

## 2017-11-06 MED ORDER — PANTOPRAZOLE SODIUM 40 MG PO TBEC
40.0000 mg | DELAYED_RELEASE_TABLET | Freq: Every day | ORAL | Status: DC
  Administered 2017-11-06 – 2017-11-07 (×2): 40 mg via ORAL
  Filled 2017-11-06 (×2): qty 1

## 2017-11-06 MED ORDER — CHLORHEXIDINE GLUCONATE 4 % EX LIQD: 60.0000 mL | Freq: Once | CUTANEOUS | Status: DC

## 2017-11-06 MED ORDER — ACETAMINOPHEN 650 MG RE SUPP: 650.0000 mg | RECTAL | Status: DC | PRN

## 2017-11-06 MED ORDER — DOCUSATE SODIUM 100 MG PO CAPS
100.0000 mg | ORAL_CAPSULE | Freq: Two times a day (BID) | ORAL | Status: DC
  Administered 2017-11-06 – 2017-11-07 (×2): 100 mg via ORAL
  Filled 2017-11-06 (×2): qty 1

## 2017-11-06 MED ORDER — PROMETHAZINE HCL 25 MG/ML IJ SOLN
INTRAMUSCULAR | Status: AC
  Filled 2017-11-06: qty 1

## 2017-11-06 MED ORDER — MIDAZOLAM HCL 2 MG/2ML IJ SOLN
INTRAMUSCULAR | Status: AC
  Filled 2017-11-06: qty 2

## 2017-11-06 MED ORDER — CELECOXIB 200 MG PO CAPS
200.0000 mg | ORAL_CAPSULE | Freq: Two times a day (BID) | ORAL | Status: DC
  Administered 2017-11-06 – 2017-11-07 (×2): 200 mg via ORAL
  Filled 2017-11-06 (×2): qty 1

## 2017-11-06 MED ORDER — ONDANSETRON HCL 4 MG PO TABS: 4.0000 mg | ORAL_TABLET | Freq: Four times a day (QID) | ORAL | Status: DC | PRN

## 2017-11-06 MED ORDER — LORATADINE 10 MG PO TABS
10.0000 mg | ORAL_TABLET | Freq: Every day | ORAL | Status: DC
  Administered 2017-11-07: 10 mg via ORAL
  Filled 2017-11-06: qty 1

## 2017-11-06 MED ORDER — DEXAMETHASONE SODIUM PHOSPHATE 10 MG/ML IJ SOLN
10.0000 mg | Freq: Two times a day (BID) | INTRAMUSCULAR | Status: DC
  Administered 2017-11-06 – 2017-11-07 (×2): 10 mg via INTRAVENOUS
  Filled 2017-11-06 (×3): qty 1

## 2017-11-06 MED ORDER — METHOCARBAMOL 500 MG PO TABS
500.0000 mg | ORAL_TABLET | Freq: Four times a day (QID) | ORAL | Status: DC | PRN
  Administered 2017-11-06 – 2017-11-07 (×2): 500 mg via ORAL
  Filled 2017-11-06 (×2): qty 1

## 2017-11-06 MED ORDER — BUPIVACAINE LIPOSOME 1.3 % IJ SUSP
20.0000 mL | Freq: Once | INTRAMUSCULAR | Status: DC
  Filled 2017-11-06: qty 20

## 2017-11-06 MED ORDER — DOCUSATE SODIUM 100 MG PO CAPS: 100.0000 mg | ORAL_CAPSULE | Freq: Two times a day (BID) | ORAL | 0 refills | Status: DC

## 2017-11-06 MED ORDER — TRANEXAMIC ACID 1000 MG/10ML IV SOLN
1000.0000 mg | INTRAVENOUS | Status: AC
  Administered 2017-11-06: 1000 mg via INTRAVENOUS
  Filled 2017-11-06: qty 10

## 2017-11-06 MED ORDER — PROMETHAZINE HCL 25 MG/ML IJ SOLN
6.2500 mg | INTRAMUSCULAR | Status: DC | PRN
  Administered 2017-11-06: 6.25 mg via INTRAVENOUS

## 2017-11-06 MED ORDER — TIZANIDINE HCL 2 MG PO TABS: 2.0000 mg | ORAL_TABLET | Freq: Three times a day (TID) | ORAL | 0 refills | Status: DC | PRN

## 2017-11-06 MED ORDER — ONDANSETRON HCL 4 MG/2ML IJ SOLN
4.0000 mg | Freq: Four times a day (QID) | INTRAMUSCULAR | Status: DC | PRN
  Administered 2017-11-06: 4 mg via INTRAVENOUS
  Filled 2017-11-06: qty 2

## 2017-11-06 MED ORDER — LOSARTAN POTASSIUM 50 MG PO TABS
50.0000 mg | ORAL_TABLET | Freq: Every day | ORAL | Status: DC
  Administered 2017-11-06 – 2017-11-07 (×2): 50 mg via ORAL
  Filled 2017-11-06 (×2): qty 1

## 2017-11-06 MED ORDER — HYDROMORPHONE HCL 1 MG/ML IJ SOLN
INTRAMUSCULAR | Status: AC
  Filled 2017-11-06: qty 1

## 2017-11-06 MED ORDER — LOSARTAN POTASSIUM-HCTZ 50-12.5 MG PO TABS: 1.0000 | ORAL_TABLET | Freq: Every day | ORAL | Status: DC

## 2017-11-06 MED ORDER — VALACYCLOVIR HCL 500 MG PO TABS
1000.0000 mg | ORAL_TABLET | Freq: Every day | ORAL | Status: DC
  Administered 2017-11-07: 1000 mg via ORAL
  Filled 2017-11-06 (×2): qty 2

## 2017-11-06 MED ORDER — MIDAZOLAM HCL 5 MG/5ML IJ SOLN
INTRAMUSCULAR | Status: DC | PRN
  Administered 2017-11-06 (×2): 1 mg via INTRAVENOUS

## 2017-11-06 MED ORDER — PHENYLEPHRINE HCL 10 MG/ML IJ SOLN
INTRAMUSCULAR | Status: DC | PRN
  Administered 2017-11-06 (×9): 20 ug via INTRAVENOUS
  Administered 2017-11-06: 40 ug via INTRAVENOUS
  Administered 2017-11-06 (×3): 20 ug via INTRAVENOUS

## 2017-11-06 MED ORDER — BUPIVACAINE HCL (PF) 0.25 % IJ SOLN
INTRAMUSCULAR | Status: AC
  Filled 2017-11-06: qty 10

## 2017-11-06 MED ORDER — ALLOPURINOL 300 MG PO TABS
300.0000 mg | ORAL_TABLET | Freq: Every day | ORAL | Status: DC
  Administered 2017-11-07: 300 mg via ORAL
  Filled 2017-11-06: qty 1

## 2017-11-06 MED ORDER — OXYCODONE-ACETAMINOPHEN 5-325 MG PO TABS: 1.0000 | ORAL_TABLET | Freq: Four times a day (QID) | ORAL | 0 refills | Status: DC | PRN

## 2017-11-06 MED ORDER — DIPHENHYDRAMINE HCL 12.5 MG/5ML PO ELIX
12.5000 mg | ORAL_SOLUTION | ORAL | Status: DC | PRN
  Administered 2017-11-06: 25 mg via ORAL
  Filled 2017-11-06: qty 10

## 2017-11-06 MED ORDER — MAGNESIUM CITRATE PO SOLN: 1.0000 | Freq: Once | ORAL | Status: DC | PRN

## 2017-11-06 MED ORDER — ONDANSETRON HCL 4 MG/2ML IJ SOLN
INTRAMUSCULAR | Status: DC | PRN
  Administered 2017-11-06: 4 mg via INTRAVENOUS

## 2017-11-06 MED ORDER — FERROUS SULFATE 325 (65 FE) MG PO TABS
325.0000 mg | ORAL_TABLET | Freq: Every day | ORAL | Status: DC
  Administered 2017-11-07: 325 mg via ORAL
  Filled 2017-11-06: qty 1

## 2017-11-06 MED ORDER — BISACODYL 5 MG PO TBEC: 5.0000 mg | DELAYED_RELEASE_TABLET | Freq: Every day | ORAL | Status: DC | PRN

## 2017-11-06 MED ORDER — BUPIVACAINE HCL (PF) 0.25 % IJ SOLN
INTRAMUSCULAR | Status: AC
  Filled 2017-11-06: qty 20

## 2017-11-06 MED ORDER — HYDROMORPHONE HCL 1 MG/ML IJ SOLN
0.5000 mg | INTRAMUSCULAR | Status: DC | PRN
  Administered 2017-11-06 – 2017-11-07 (×4): 1 mg via INTRAVENOUS
  Filled 2017-11-06 (×4): qty 1

## 2017-11-06 MED ORDER — SODIUM CHLORIDE 0.9 % IV SOLN
INTRAVENOUS | Status: DC
  Administered 2017-11-06: 23:00:00 via INTRAVENOUS

## 2017-11-06 MED ORDER — HYDROCHLOROTHIAZIDE 12.5 MG PO CAPS
12.5000 mg | ORAL_CAPSULE | Freq: Every day | ORAL | Status: DC
  Administered 2017-11-07: 12.5 mg via ORAL
  Filled 2017-11-06: qty 1

## 2017-11-06 MED ORDER — ALUM & MAG HYDROXIDE-SIMETH 200-200-20 MG/5ML PO SUSP: 30.0000 mL | ORAL | Status: DC | PRN

## 2017-11-06 MED ORDER — LACTATED RINGERS IV SOLN
INTRAVENOUS | Status: DC
  Administered 2017-11-06 (×2): via INTRAVENOUS

## 2017-11-06 MED ORDER — POLYETHYLENE GLYCOL 3350 17 G PO PACK: 17.0000 g | PACK | Freq: Every day | ORAL | Status: DC | PRN

## 2017-11-06 MED ORDER — GABAPENTIN 300 MG PO CAPS
300.0000 mg | ORAL_CAPSULE | Freq: Two times a day (BID) | ORAL | Status: DC
  Administered 2017-11-06 – 2017-11-07 (×2): 300 mg via ORAL
  Filled 2017-11-06 (×2): qty 1

## 2017-11-06 MED ORDER — 0.9 % SODIUM CHLORIDE (POUR BTL) OPTIME
TOPICAL | Status: DC | PRN
  Administered 2017-11-06: 1000 mL

## 2017-11-06 MED ORDER — HYDROMORPHONE HCL 1 MG/ML IJ SOLN
0.2500 mg | INTRAMUSCULAR | Status: DC | PRN
  Administered 2017-11-06 (×3): 0.5 mg via INTRAVENOUS

## 2017-11-06 MED ORDER — PROPOFOL 500 MG/50ML IV EMUL
INTRAVENOUS | Status: DC | PRN
  Administered 2017-11-06: 100 ug/kg/min via INTRAVENOUS

## 2017-11-06 MED ORDER — CEFAZOLIN SODIUM-DEXTROSE 2-4 GM/100ML-% IV SOLN
2.0000 g | Freq: Four times a day (QID) | INTRAVENOUS | Status: AC
  Administered 2017-11-06 – 2017-11-07 (×2): 2 g via INTRAVENOUS
  Filled 2017-11-06 (×2): qty 100

## 2017-11-06 MED ORDER — MEPERIDINE HCL 25 MG/ML IJ SOLN: 6.2500 mg | INTRAMUSCULAR | Status: DC | PRN

## 2017-11-06 MED ORDER — ACETAMINOPHEN 325 MG PO TABS: 650.0000 mg | ORAL_TABLET | ORAL | Status: DC | PRN

## 2017-11-06 MED ORDER — ASPIRIN EC 325 MG PO TBEC
325.0000 mg | DELAYED_RELEASE_TABLET | Freq: Two times a day (BID) | ORAL | Status: DC
  Administered 2017-11-07: 325 mg via ORAL
  Filled 2017-11-06: qty 1

## 2017-11-06 SURGICAL SUPPLY — 64 items
APL SKNCLS STERI-STRIP NONHPOA (GAUZE/BANDAGES/DRESSINGS) ×1
BANDAGE ACE 4X5 VEL STRL LF (GAUZE/BANDAGES/DRESSINGS) ×2 IMPLANT
BANDAGE ACE 6X5 VEL STRL LF (GAUZE/BANDAGES/DRESSINGS) ×2 IMPLANT
BANDAGE ESMARK 6X9 LF (GAUZE/BANDAGES/DRESSINGS) ×1 IMPLANT
BENZOIN TINCTURE PRP APPL 2/3 (GAUZE/BANDAGES/DRESSINGS) ×3 IMPLANT
BLADE SAGITTAL 25.0X1.19X90 (BLADE) ×2 IMPLANT
BLADE SAGITTAL 25.0X1.19X90MM (BLADE) ×1
BLADE SAW SAG 90X13X1.27 (BLADE) ×7 IMPLANT
BNDG CMPR 9X6 STRL LF SNTH (GAUZE/BANDAGES/DRESSINGS) ×1
BNDG ESMARK 6X9 LF (GAUZE/BANDAGES/DRESSINGS) ×3
BOWL SMART MIX CTS (DISPOSABLE) ×3 IMPLANT
CAPT KNEE TOTAL 3 ATTUNE ×2 IMPLANT
CEMENT HV SMART SET (Cement) ×6 IMPLANT
CLOSURE STERI-STRIP 1/2X4 (GAUZE/BANDAGES/DRESSINGS) ×1
CLOSURE WOUND 1/2 X4 (GAUZE/BANDAGES/DRESSINGS)
CLSR STERI-STRIP ANTIMIC 1/2X4 (GAUZE/BANDAGES/DRESSINGS) ×1 IMPLANT
COVER SURGICAL LIGHT HANDLE (MISCELLANEOUS) ×3 IMPLANT
CUFF TOURNIQUET SINGLE 34IN LL (TOURNIQUET CUFF) ×3 IMPLANT
CUFF TOURNIQUET SINGLE 44IN (TOURNIQUET CUFF) IMPLANT
DRAPE EXTREMITY T 121X128X90 (DRAPE) ×3 IMPLANT
DRAPE U-SHAPE 47X51 STRL (DRAPES) ×3 IMPLANT
DRSG AQUACEL AG ADV 3.5X10 (GAUZE/BANDAGES/DRESSINGS) ×2 IMPLANT
DRSG PAD ABDOMINAL 8X10 ST (GAUZE/BANDAGES/DRESSINGS) ×3 IMPLANT
DURAPREP 26ML APPLICATOR (WOUND CARE) ×3 IMPLANT
ELECT REM PT RETURN 9FT ADLT (ELECTROSURGICAL) ×3
ELECTRODE REM PT RTRN 9FT ADLT (ELECTROSURGICAL) ×1 IMPLANT
EVACUATOR 1/8 PVC DRAIN (DRAIN) IMPLANT
FACESHIELD WRAPAROUND (MASK) ×3 IMPLANT
FACESHIELD WRAPAROUND OR TEAM (MASK) ×1 IMPLANT
GAUZE SPONGE 4X4 12PLY STRL (GAUZE/BANDAGES/DRESSINGS) ×3 IMPLANT
GLOVE BIOGEL PI IND STRL 8 (GLOVE) ×2 IMPLANT
GLOVE BIOGEL PI INDICATOR 8 (GLOVE) ×4
GLOVE ECLIPSE 7.5 STRL STRAW (GLOVE) ×6 IMPLANT
GOWN STRL REUS W/ TWL LRG LVL3 (GOWN DISPOSABLE) ×1 IMPLANT
GOWN STRL REUS W/ TWL XL LVL3 (GOWN DISPOSABLE) ×2 IMPLANT
GOWN STRL REUS W/TWL LRG LVL3 (GOWN DISPOSABLE) ×3
GOWN STRL REUS W/TWL XL LVL3 (GOWN DISPOSABLE) ×6
HANDPIECE INTERPULSE COAX TIP (DISPOSABLE) ×3
HOOD PEEL AWAY FACE SHEILD DIS (HOOD) ×6 IMPLANT
IMMOBILIZER KNEE 22 UNIV (SOFTGOODS) ×3 IMPLANT
KIT BASIN OR (CUSTOM PROCEDURE TRAY) ×3 IMPLANT
KIT ROOM TURNOVER OR (KITS) ×3 IMPLANT
MANIFOLD NEPTUNE II (INSTRUMENTS) ×3 IMPLANT
NEEDLE 22X1 1/2 (OR ONLY) (NEEDLE) ×3 IMPLANT
NS IRRIG 1000ML POUR BTL (IV SOLUTION) ×3 IMPLANT
PACK TOTAL JOINT (CUSTOM PROCEDURE TRAY) ×3 IMPLANT
PAD ARMBOARD 7.5X6 YLW CONV (MISCELLANEOUS) ×6 IMPLANT
PADDING CAST COTTON 6X4 STRL (CAST SUPPLIES) ×2 IMPLANT
SET HNDPC FAN SPRY TIP SCT (DISPOSABLE) ×1 IMPLANT
STAPLER VISISTAT 35W (STAPLE) IMPLANT
STRIP CLOSURE SKIN 1/2X4 (GAUZE/BANDAGES/DRESSINGS) IMPLANT
SUCTION FRAZIER HANDLE 10FR (MISCELLANEOUS) ×2
SUCTION TUBE FRAZIER 10FR DISP (MISCELLANEOUS) ×1 IMPLANT
SUT MNCRL AB 3-0 PS2 18 (SUTURE) IMPLANT
SUT VIC AB 0 CTB1 27 (SUTURE) ×6 IMPLANT
SUT VIC AB 1 CT1 27 (SUTURE) ×6
SUT VIC AB 1 CT1 27XBRD ANBCTR (SUTURE) ×2 IMPLANT
SUT VIC AB 2-0 CTB1 (SUTURE) ×6 IMPLANT
SYR 50ML LL SCALE MARK (SYRINGE) ×3 IMPLANT
TOWEL OR 17X24 6PK STRL BLUE (TOWEL DISPOSABLE) ×3 IMPLANT
TOWEL OR 17X26 10 PK STRL BLUE (TOWEL DISPOSABLE) ×3 IMPLANT
TRAY CATH 16FR W/PLASTIC CATH (SET/KITS/TRAYS/PACK) IMPLANT
TRAY FOLEY W/METER SILVER 16FR (SET/KITS/TRAYS/PACK) IMPLANT
WRAP KNEE MAXI GEL POST OP (GAUZE/BANDAGES/DRESSINGS) ×3 IMPLANT

## 2017-11-06 NOTE — Progress Notes (Signed)
Orthopedic Tech Progress Note Patient Details:  THADIUS SMISEK 10-02-1956 [id number]  CPM Left Knee CPM Left Knee: On Left Knee Flexion (Degrees): 90 Left Knee Extension (Degrees): 0 Additional Comments: applied cpm to pt left knee.  pt tolerated application very well.  applied OHF for support.  left knee.   Post Interventions Patient Tolerated: Well Instructions Provided: Adjustment of device, Care of device  Kristopher Oppenheim 11/06/2017, 5:20 PM

## 2017-11-06 NOTE — Discharge Instructions (Signed)

## 2017-11-06 NOTE — Brief Op Note (Signed)
11/06/2017  4:00 PM  PATIENT:  Tomma Rakers  62 y.o. male  PRE-OPERATIVE DIAGNOSIS:  LEFT KNEE OSTEOARTHRITIS  POST-OPERATIVE DIAGNOSIS:  LEFT KNEE OSTEOARTHRITIS  PROCEDURE:  Procedure(s): TOTAL KNEE ARTHROPLASTY (Left)  SURGEON:  Surgeon(s) and Role:    Dorna Leitz, MD - Primary  PHYSICIAN ASSISTANT:   ASSISTANTS: bethune   ANESTHESIA:   spinal  EBL:  none   BLOOD ADMINISTERED:none  DRAINS: none   LOCAL MEDICATIONS USED:  MARCAINE    and OTHER experel  SPECIMEN:  No Specimen  DISPOSITION OF SPECIMEN:  N/A  COUNTS:  YES  TOURNIQUET:   Total Tourniquet Time Documented: Thigh (Left) - 62 minutes Total: Thigh (Left) - 62 minutes   DICTATION: .Other Dictation: Dictation Number [id number]  PLAN OF CARE: Admit to inpatient   PATIENT DISPOSITION:  PACU - hemodynamically stable.   Delay start of Pharmacological VTE agent (>24hrs) due to surgical blood loss or risk of bleeding: no

## 2017-11-06 NOTE — Anesthesia Preprocedure Evaluation (Signed)
Anesthesia Evaluation  Patient identified by MRN, date of birth, ID band Patient awake    Reviewed: Allergy & Precautions, NPO status , Patient's Chart, lab work & pertinent test results  Airway Mallampati: II  TM Distance: >3 FB Neck ROM: Full    Dental no notable dental hx.    Pulmonary neg pulmonary ROS, Current Smoker,    Pulmonary exam normal breath sounds clear to auscultation       Cardiovascular hypertension, Pt. on medications Normal cardiovascular exam Rhythm:Regular Rate:Normal     Neuro/Psych negative neurological ROS  negative psych ROS   GI/Hepatic Neg liver ROS, GERD  ,  Endo/Other  negative endocrine ROS  Renal/GU negative Renal ROS     Musculoskeletal  (+) Arthritis ,   Abdominal   Peds  Hematology  (+) anemia ,   Anesthesia Other Findings   Reproductive/Obstetrics                             Anesthesia Physical Anesthesia Plan  ASA: III  Anesthesia Plan: Spinal   Post-op Pain Management:  Regional for Post-op pain   Induction: Intravenous  PONV Risk Score and Plan: 1 and Ondansetron and Propofol infusion  Airway Management Planned:   Additional Equipment:   Intra-op Plan:   Post-operative Plan:   Informed Consent: I have reviewed the patients History and Physical, chart, labs and discussed the procedure including the risks, benefits and alternatives for the proposed anesthesia with the patient or authorized representative who has indicated his/her understanding and acceptance.   Dental advisory given  Plan Discussed with: CRNA  Anesthesia Plan Comments:         Anesthesia Quick Evaluation

## 2017-11-06 NOTE — Transfer of Care (Signed)
Immediate Anesthesia Transfer of Care Note  Patient: Matthew [name]  Procedure(s) Performed: TOTAL KNEE ARTHROPLASTY (Left Knee)  Patient Location: PACU  Anesthesia Type:Spinal and MAC combined with regional for post-op pain  Level of Consciousness: awake, alert  and oriented  Airway & Oxygen Therapy: Patient Spontanous Breathing and Patient connected to face mask oxygen  Post-op Assessment: Report given to RN and Post -op Vital signs reviewed and stable  Post vital signs: Reviewed and stable  Last Vitals:  Vitals:   11/06/17 1315 11/06/17 1320  BP: (!) 139/95 (!) 130/95  Pulse: 71 68  Resp: 15 12  Temp:    SpO2: 100% 97%    Last Pain:  Vitals:   11/06/17 1244  TempSrc:   PainSc: 2          Complications: No apparent anesthesia complications

## 2017-11-06 NOTE — Anesthesia Procedure Notes (Signed)
Anesthesia Regional Block: Adductor canal block   Pre-Anesthetic Checklist: ,, timeout performed, Correct Patient, Correct Site, Correct Laterality, Correct Procedure, Correct Position, site marked, Risks and benefits discussed,  Surgical consent,  Pre-op evaluation,  At surgeon's request and post-op pain management  Laterality: Left  Prep: chloraprep       Needles:  Injection technique: Single-shot  Needle Type: Stimiplex     Needle Length: 9cm  Needle Gauge: 21     Additional Needles:   Procedures:,,,, ultrasound used (permanent image in chart),,,,  Narrative:  Start time: 11/06/2017 1:13 PM End time: 11/06/2017 1:15 PM Injection made incrementally with aspirations every 5 mL.  Performed by: Personally  Anesthesiologist: Nolon Nations, MD  Additional Notes: BP cuff, EKG monitors applied. Sedation begun. Artery and nerve location verified with U/S and anesthetic injected incrementally, slowly, and after negative aspirations under direct u/s guidance. Good fascial /perineural spread. Tolerated well.

## 2017-11-06 NOTE — Anesthesia Procedure Notes (Signed)
Spinal  Patient location during procedure: OR Staffing Anesthesiologist: Levert Heslop, MD Performed: anesthesiologist  Preanesthetic Checklist Completed: patient identified, site marked, surgical consent, pre-op evaluation, timeout performed, IV checked, risks and benefits discussed and monitors and equipment checked Spinal Block Patient position: sitting Prep: ChloraPrep Patient monitoring: heart rate, continuous pulse ox and blood pressure Approach: right paramedian Location: L2-3 Injection technique: single-shot Needle Needle type: Sprotte  Needle gauge: 24 G Needle length: 9 cm Additional Notes Expiration date of kit checked and confirmed. Patient tolerated procedure well, without complications.       

## 2017-11-06 NOTE — H&P (Signed)
TOTAL KNEE ADMISSION H&P  Patient is being admitted for left total knee arthroplasty.  Subjective:  Chief Complaint:left knee pain.  HPI: Matthew [name], 62 y.o. male, has a history of pain and functional disability in the left knee due to arthritis and has failed non-surgical conservative treatments for greater than 12 weeks to includeNSAID's and/or analgesics, corticosteriod injections, viscosupplementation injections, flexibility and strengthening excercises, supervised PT with diminished ADL's post treatment and activity modification.  Onset of symptoms was gradual, starting 5 years ago with gradually worsening course since that time. The patient noted no past surgery on the left knee(s).  Patient currently rates pain in the left knee(s) at 9 out of 10 with activity. Patient has night pain, worsening of pain with activity and weight bearing, pain that interferes with activities of daily living, pain with passive range of motion, crepitus and joint swelling.  Patient has evidence of subchondral sclerosis and joint space narrowing by imaging studies. This patient has had ffailure of all reasonable conservative care. There is no active infection.  Patient Active Problem List   Diagnosis Date Noted  . Lipoma of torso 11/02/2017  . Ganglion 11/02/2017  . Sleep deficient 03/22/2017  . Bursitis of left foot 11/11/2016  . Subluxation of tendon of long head of biceps 10/15/2016  . Leg cramping 10/15/2016  . Hamstring tendinitis of left thigh 07/26/2016  . Gout 07/26/2016  . Osteitis pubis ([place]) 01/27/2016  . Neck tightness 08/12/2015  . Nonallopathic lesion of cervical region 08/12/2015  . Nonallopathic lesion of thoracic region 08/12/2015  . Nonallopathic lesion-rib cage 08/12/2015  . ACE-inhibitor cough 08/12/2015  . GERD (gastroesophageal reflux disease) 08/01/2014  . Neck pain 03/03/2014  . Hypertension 02/14/2014  . Allergic rhinitis 02/14/2014  . Herpes genitalis in men 02/14/2014   . Personal history of colonic polyps 05/06/2011  . Pes planus 04/30/2008   Past Medical History:  Diagnosis Date  . Allergy    RHINITIS  . Anemia   . Arthritis   . Colonic polyp 09/2006   colonoscopy  . GERD (gastroesophageal reflux disease)   . Hypertension     Past Surgical History:  Procedure Laterality Date  . COLONOSCOPY  09/2006  . KNEE ARTHROSCOPY Left   . LUMBAR DISC SURGERY  1990    Current Facility-Administered Medications  Medication Dose Route Frequency Provider Last Rate Last Dose  . ceFAZolin (ANCEF) IVPB 2g/100 mL premix  2 g Intravenous To Nino Parsley, MD       Current Outpatient Medications  Medication Sig Dispense Refill Last Dose  . acetaminophen (TYLENOL) 500 MG tablet Take 1,000 mg by mouth every 6 (six) hours as needed for moderate pain or headache.   Not Taking  . Artificial Tear Ointment (LUBRICANT EYE OP) Place 1 drop into both eyes daily.   Taking  . aspirin 81 MG tablet Take 81 mg by mouth daily.     Taking  . cetirizine (ZYRTEC) 10 MG tablet Take 10 mg by mouth daily.   Taking  . esomeprazole (NEXIUM 24HR) 20 MG capsule Take 40 mg by mouth daily as needed (for acid reflux).   Taking  . Ferrous Sulfate (IRON) 325 (65 FE) MG TABS Take 325 mg by mouth daily.    Taking  . fish oil-omega-3 fatty acids 1000 MG capsule Take 2 g by mouth daily.    Taking  . gabapentin (NEURONTIN) 300 MG capsule Take 300 mg by mouth at bedtime.   Taking  . Multiple Vitamin (MULTIVITAMIN) tablet  Take 1 tablet by mouth daily.     Taking  . traZODone (DESYREL) 50 MG tablet Take 0.5-1 tablets (25-50 mg total) by mouth at bedtime as needed for sleep. 30 tablet 3 Taking  . TURMERIC PO Take 1 capsule by mouth daily.   Taking  . valACYclovir (VALTREX) 1000 MG tablet TAKE 1 TABLET BY MOUTH EVERY DAY (Patient taking differently: TAKE 1000 MG BY MOUTH EVERY DAY) 30 tablet 5 Taking  . Vitamin D, Ergocalciferol, (DRISDOL) 50000 units CAPS capsule TAKE 1 CAPSULE (50,000 UNITS  TOTAL) BY MOUTH EVERY 7 (SEVEN) DAYS. (Patient taking differently: Take 50,000 Units by mouth every Sunday. ) 12 capsule 0 Taking  . allopurinol (ZYLOPRIM) 300 MG tablet Take 1 tablet (300 mg total) by mouth daily. 90 tablet 3   . hydrOXYzine (ATARAX/VISTARIL) 25 MG tablet TAKE 1 TABLET BY MOUTH AT BEDTIME AS NEEDED. (Patient not taking: Reported on 10/27/2017) 90 tablet 0 Not Taking  . losartan-hydrochlorothiazide (HYZAAR) 50-12.5 MG tablet Take 1 tablet by mouth daily. 90 tablet 3   . meloxicam (MOBIC) 15 MG tablet Take 1 tablet (15 mg total) by mouth daily. (Patient not taking: Reported on 10/27/2017) 90 tablet 1 Not Taking  . OVER THE COUNTER MEDICATION    Taking  . traMADol (ULTRAM) 50 MG tablet TAKE 1 TABLET (50 MG TOTAL) BY MOUTH EVERY 8 (EIGHT) HOURS AS NEEDED. 30 tablet 0 Taking  . zolpidem (AMBIEN) 5 MG tablet Take 1 tablet (5 mg total) by mouth at bedtime as needed for sleep. (Patient not taking: Reported on 10/27/2017) 15 tablet 1 Not Taking   Allergies  Allergen Reactions  . Demerol Itching    Social History   Tobacco Use  . Smoking status: Current Some Day Smoker    Types: Cigars  . Smokeless tobacco: Never Used  Substance Use Topics  . Alcohol use: Yes    Alcohol/week: 1.5 oz    Types: 3 Standard drinks or equivalent per week    Comment: occ 2-3 beers    Family History  Problem Relation Age of Onset  . Stroke Father      ROS ROS: I have reviewed the patient's review of systems thoroughly and there are no positive responses as relates to the HPI. Objective:  Physical Exam  Vital signs in last 24 hours:   Well-developed well-nourished patient in no acute distress. Alert and oriented x3 HEENT:within normal limits Cardiac: Regular rate and rhythm Pulmonary: Lungs clear to auscultation Abdomen: Soft and nontender.  Normal active bowel sounds  Musculoskeletal: Left knee: Painful range of motion.  Limited range of motion.  No instability  Trace  effusion. Labs:  Recent Results (from the past 2160 hour(s))  Surgical pcr screen     Status: Abnormal   Collection Time: 11/01/17  3:36 PM  Result Value Ref Range   MRSA, PCR NEGATIVE NEGATIVE   Staphylococcus aureus POSITIVE (A) NEGATIVE    Comment: (NOTE) The Xpert SA Assay (FDA approved for NASAL specimens in patients 48 years of age and older), is one component of a comprehensive surveillance program. It is not intended to diagnose infection nor to guide or monitor treatment.   APTT     Status: None   Collection Time: 11/01/17  3:36 PM  Result Value Ref Range   aPTT 31 24 - 36 seconds  CBC WITH DIFFERENTIAL     Status: None   Collection Time: 11/01/17  3:36 PM  Result Value Ref Range   WBC 6.7 4.0 - 10.5  K/uL   RBC 4.47 4.22 - 5.81 MIL/uL   Hemoglobin 14.3 13.0 - 17.0 g/dL   HCT 42.0 39.0 - 52.0 %   MCV 94.0 78.0 - 100.0 fL   MCH 32.0 26.0 - 34.0 pg   MCHC 34.0 30.0 - 36.0 g/dL   RDW 12.3 11.5 - 15.5 %   Platelets 330 150 - 400 K/uL   Neutrophils Relative % 59 %   Neutro Abs 4.0 1.7 - 7.7 K/uL   Lymphocytes Relative 24 %   Lymphs Abs 1.6 0.7 - 4.0 K/uL   Monocytes Relative 12 %   Monocytes Absolute 0.8 0.1 - 1.0 K/uL   Eosinophils Relative 4 %   Eosinophils Absolute 0.3 0.0 - 0.7 K/uL   Basophils Relative 1 %   Basophils Absolute 0.0 0.0 - 0.1 K/uL  Comprehensive metabolic panel     Status: Abnormal   Collection Time: 11/01/17  3:36 PM  Result Value Ref Range   Sodium 136 135 - 145 mmol/L   Potassium 3.5 3.5 - 5.1 mmol/L   Chloride 99 (L) 101 - 111 mmol/L   CO2 28 22 - 32 mmol/L   Glucose, Bld 93 65 - 99 mg/dL   BUN 11 6 - 20 mg/dL   Creatinine, Ser 0.93 0.61 - 1.24 mg/dL   Calcium 9.9 8.9 - 10.3 mg/dL   Total Protein 8.4 (H) 6.5 - 8.1 g/dL   Albumin 4.9 3.5 - 5.0 g/dL   AST 33 15 - 41 U/L   ALT 31 17 - 63 U/L   Alkaline Phosphatase 57 38 - 126 U/L   Total Bilirubin 0.8 0.3 - 1.2 mg/dL   GFR calc non Af Amer >60 >60 mL/min   GFR calc Af Amer >60 >60  mL/min    Comment: (NOTE) The eGFR has been calculated using the CKD EPI equation. This calculation has not been validated in all clinical situations. eGFR's persistently <60 mL/min signify possible Chronic Kidney Disease.    Anion gap 9 5 - 15  Protime-INR     Status: None   Collection Time: 11/01/17  3:36 PM  Result Value Ref Range   Prothrombin Time 12.4 11.4 - 15.2 seconds   INR 0.94   Urinalysis, Routine w reflex microscopic     Status: None   Collection Time: 11/01/17  3:36 PM  Result Value Ref Range   Color, Urine YELLOW YELLOW   APPearance CLEAR CLEAR   Specific Gravity, Urine 1.011 1.005 - 1.030   pH 7.0 5.0 - 8.0   Glucose, UA NEGATIVE NEGATIVE mg/dL   Hgb urine dipstick NEGATIVE NEGATIVE   Bilirubin Urine NEGATIVE NEGATIVE   Ketones, ur NEGATIVE NEGATIVE mg/dL   Protein, ur NEGATIVE NEGATIVE mg/dL   Nitrite NEGATIVE NEGATIVE   Leukocytes, UA NEGATIVE NEGATIVE   RBC / HPF 0-5 0 - 5 RBC/hpf   WBC, UA 0-5 0 - 5 WBC/hpf   Bacteria, UA NONE SEEN NONE SEEN   Squamous Epithelial / LPF NONE SEEN NONE SEEN  Type and screen Order type and screen if day of surgery is less than 15 days from draw of preadmission visit or order morning of surgery if day of surgery is greater than 6 days from preadmission visit.     Status: None   Collection Time: 11/01/17  3:50 PM  Result Value Ref Range   ABO/RH(D) O POS    Antibody Screen NEG    Sample Expiration 11/15/2017    Extend sample reason NO TRANSFUSIONS OR PREGNANCY  IN THE PAST 3 MONTHS   ABO/Rh     Status: None   Collection Time: 11/01/17  3:50 PM  Result Value Ref Range   ABO/RH(D) O POS   Hepatitis C antibody     Status: None   Collection Time: 11/02/17  2:42 PM  Result Value Ref Range   Hepatitis C Ab NON-REACTIVE NON-REACTI   SIGNAL TO CUT-OFF 0.01 <1.00  Lipid panel     Status: Abnormal   Collection Time: 11/02/17  2:42 PM  Result Value Ref Range   Cholesterol 208 (H) <200 mg/dL   HDL 71 >40 mg/dL   Triglycerides 96  <150 mg/dL   LDL Cholesterol (Calc) 117 (H) mg/dL (calc)    Comment: Reference range: <100 . Desirable range <100 mg/dL for primary prevention;   <70 mg/dL for patients with CHD or diabetic patients  with > or = 2 CHD risk factors. [name] LDL-C is now calculated using the Martin-Hopkins  calculation, which is a validated novel method providing  better accuracy than the Friedewald equation in the  estimation of LDL-C.  Cresenciano Genre et al. Annamaria Helling. 7412;878(67): 2061-2068  (http://education.[facility].com/faq/FAQ164)    Total CHOL/HDL Ratio 2.9 <5.0 (calc)   Non-HDL Cholesterol (Calc) 137 (H) <130 mg/dL (calc)    Comment: For patients with diabetes plus 1 major ASCVD risk  factor, treating to a non-HDL-C goal of <100 mg/dL  (LDL-C of <70 mg/dL) is considered a therapeutic  option.   Uric Acid     Status: None   Collection Time: 11/02/17  2:42 PM  Result Value Ref Range   Uric Acid, Serum 4.7 4.0 - 8.0 mg/dL    Comment: Therapeutic target for gout patients: <6.0 mg/dL .   PSA     Status: None   Collection Time: 11/02/17  2:42 PM  Result Value Ref Range   PSA 0.7 < OR = 4.0 ng/mL    Comment: The total PSA value from this assay system is  standardized against the WHO standard. The test  result will be approximately 20% lower when compared  to the equimolar-standardized total PSA (Beckman  Coulter). Comparison of serial PSA results should be  interpreted with this fact in mind. . This test was performed using the Siemens  chemiluminescent method. Values obtained from  different assay methods cannot be used interchangeably. PSA levels, regardless of value, should not be interpreted as absolute evidence of the presence or absence of disease.   POCT Urinalysis DIP (Proadvantage Device)     Status: None   Collection Time: 11/02/17  2:43 PM  Result Value Ref Range   Color, UA yellow yellow   Clarity, UA clear clear   Glucose, UA negative negative mg/dL   Bilirubin, UA negative  negative   Ketones, POC UA negative negative mg/dL   Specific Gravity, Urine 1.015    Blood, UA negative negative   pH, UA 7.0 5.0 - 8.0   Protein Ur, POC negative negative mg/dL   Urobilinogen, Ur neg    Nitrite, UA Negative Negative   Leukocytes, UA Negative Negative   Estimated body mass index is 27.48 kg/m as calculated from the following:   Height as of 11/02/17: 6' (1.829 m).   Weight as of 11/02/17: 91.9 kg (202 lb 9.6 oz).   Imaging Review Plain radiographs demonstrate severe degenerative joint disease of the left knee(s). The overall alignment ismild valgus. The bone quality appears to be good for age and reported activity level.  Assessment/Plan:  End stage arthritis,  left knee   The patient history, physical examination, clinical judgment of the provider and imaging studies are consistent with end stage degenerative joint disease of the left knee(s) and total knee arthroplasty is deemed medically necessary. The treatment options including medical management, injection therapy arthroscopy and arthroplasty were discussed at length. The risks and benefits of total knee arthroplasty were presented and reviewed. The risks due to aseptic loosening, infection, stiffness, patella tracking problems, thromboembolic complications and other imponderables were discussed. The patient acknowledged the explanation, agreed to proceed with the plan and consent was signed. Patient is being admitted for inpatient treatment for surgery, pain control, PT, OT, prophylactic antibiotics, VTE prophylaxis, progressive ambulation and ADL's and discharge planning. The patient is planning to be discharged home with home health services

## 2017-11-07 ENCOUNTER — Encounter (HOSPITAL_COMMUNITY): Payer: Self-pay | Admitting: General Practice

## 2017-11-07 ENCOUNTER — Other Ambulatory Visit: Payer: Self-pay

## 2017-11-07 LAB — CBC
HEMATOCRIT: 28.8 % — AB (ref 39.0–52.0)
HEMOGLOBIN: 9.7 g/dL — AB (ref 13.0–17.0)
MCH: 31.6 pg (ref 26.0–34.0)
MCHC: 33.7 g/dL (ref 30.0–36.0)
MCV: 93.8 fL (ref 78.0–100.0)
Platelets: 248 10*3/uL (ref 150–400)
RBC: 3.07 MIL/uL — ABNORMAL LOW (ref 4.22–5.81)
RDW: 11.9 % (ref 11.5–15.5)
WBC: 8.9 10*3/uL (ref 4.0–10.5)

## 2017-11-07 NOTE — Progress Notes (Signed)
Pt was ambulating to the hall with walker. Left knee dressing dry and intact. Discharge instructions given to pt. Discharged to home accompanied by wife.

## 2017-11-07 NOTE — Progress Notes (Signed)
Subjective: 1 Day Post-Op Procedure(s) (LRB): TOTAL KNEE ARTHROPLASTY (Left) Patient reports pain as moderate.Taking by mouth and voiding okay.   Objective: Vital signs in last 24 hours: Temp:  [96.8 F (36 C)-99.7 F (37.6 C)] 99.7 F (37.6 C) (01/08 0941) Pulse Rate:  [49-98] 98 (01/08 0941) Resp:  [9-20] 20 (01/08 0941) BP: (108-169)/(75-108) 146/89 (01/08 0941) SpO2:  [83 %-100 %] 98 % (01/08 0941) Weight:  [91.6 kg (202 lb)-96.4 kg (212 lb 8.4 oz)] 96.4 kg (212 lb 8.4 oz) (01/07 1950)  Intake/Output from previous day: 01/07 0701 - 01/08 0700 In: 3150 [P.O.:600; I.V.:2550] Out: 760 [Urine:660; Blood:100] Intake/Output this shift: Total I/O In: -  Out: 500 [Urine:500]  No results for input(s): HGB in the last 72 hours. No results for input(s): WBC, RBC, HCT, PLT in the last 72 hours. No results for input(s): NA, K, CL, CO2, BUN, CREATININE, GLUCOSE, CALCIUM in the last 72 hours. No results for input(s): LABPT, INR in the last 72 hours. Left knee exam: Neurovascular intact Sensation intact distally Intact pulses distally Dorsiflexion/Plantar flexion intact Incision: dressing C/D/I Compartment soft  Assessment/Plan: 1 Day Post-Op Procedure(s) (LRB): TOTAL KNEE ARTHROPLASTY (Left)  Plan: Weight-bear as tolerated on left. Aspirin enteric-coated 325 mg twice daily for DVT prophylaxis 1 month postop. Up with therapy Discharge home with home health Today If passes physical therapy today which I anticipate he will. Follow-up with Dr. Berenice Primas in 2 weeks.  Perry G 11/07/2017, 10:02 AM

## 2017-11-07 NOTE — Op Note (Signed)
NAME:  Matthew [name], Matthew [name] NO.:  MEDICAL RECORD NO.:  [id number]  LOCATION:                                 FACILITY:  PHYSICIAN:  Alta Corning, M.D.        DATE OF BIRTH:  DATE OF PROCEDURE:  11/06/2017 DATE OF DISCHARGE:                              OPERATIVE REPORT   PREOPERATIVE DIAGNOSIS:  End-stage degenerative joint disease, left knee, with bone-on-bone change in the medial compartment.  POSTOPERATIVE DIAGNOSIS:  End-stage degenerative joint disease, left knee, with bone-on-bone change in the medial compartment.  PROCEDURE:  Left total knee replacement with an Attune system size 6 femur, size 8 tibia, 6-mm bridging bearing, and a 41-mm all-polyethylene patella.  SURGEON:  Alta Corning, MD.  ASSISTANT:  Gary Fleet, PA.  ANESTHESIA:  Spinal.  BRIEF HISTORY:  Matthew [name] is a 62 year old male with long history and significant complaints of left knee pain.  He had been treated conservatively for prolonged period of time.  He has had MRI examination, which shows that he had significant edema, chondromalacia, and a significant peripheral meniscal tear.  We talked to him about treatment options including the possibility of meniscectomy. Unfortunately, standing Von Constance Holster view showed that he had bone-on-bone change and after discussion he felt that he wanted to go in the arthroplasty direction.  We talked about unicompartmental arthroplasty given that he had primarily medial compartment disease, but his activity level was such that he felt that total knee replacement was his choice and he is brought to the operating room for this procedure.  DESCRIPTION OF PROCEDURE:  The patient was brought to the operative room and after adequate anesthesia was obtained with a spinal anesthetic, the patient was placed supine on the operating table.  The left leg was prepped and draped in usual sterile fashion.  Following this, the leg was exsanguinated and  blood pressure tourniquet was inflated to 300 mmHg.  Following this, attention was turned towards the knee where the medial and lateral meniscus were removed, retropatellar fat pad, synovium on the anterior aspect of the femur, and anterior and posterior cruciates.  Once this was done, attention was turned towards the femur where an intramedullary pilot hole was drilled and a 4-degree valgus inclination cut was made followed by sizing of the femur, sized to a 6. Anterior and posterior cuts were made, chamfers and box.  Attention was turned to the tibia.  It was cut perpendicular to its long axis and sized this to an 8.  Anterior and posterior cuts were made, chamfers and box.  Attention was then turned towards the towards the tibia where it was cut perpendicular to its long axis and following this, attention was turned towards size to an 8, drilled and keeled.  Following this, attention was turned to the patella, cut down to a level of 13 mm and lugs were drilled for 41 patella.  Following this, the trials were placed, knee was put through a range of motion, excellent stability and range of motion were achieved.  At this point, all trial components were removed and the knee was then thoroughly and copiously irrigated with pulsatile  lavage irrigation and suctioned dry.  The final components were then cemented in the place, size 8 tibia, size 6 femur, 6-mm bridging bearing trial was placed and a 41 all-poly patella.  Once cement allowed to harden, the excess bone and cement were removed.  All bleeding was controlled with electrocautery and the tourniquet was let down and we trialed a 7 poly.  It was too tight, went back to the 6. Excellent range of motion and stability were achieved here.  The medial parapatellar arthrotomy was then closed with 1 Vicryl running, skin with 0 and 2-0 Vicryl, and 3-0 Monocryl subcuticular.  Benzoin and Steri- Strips were applied.  Sterile compressive dressing  was applied.  The patient was taken to the recovery room where she was noted to be in satisfactory condition.  Estimated blood loss for procedure is minimal.     Alta Corning, M.D.     Corliss Skains  D:  11/06/2017  T:  11/06/2017  Job:  [id number]

## 2017-11-07 NOTE — Progress Notes (Addendum)
Physical Therapy Treatment and Discharge Patient Details Name: Matthew [name] MRN: [id number] DOB: 09-29-1956 Today's Date: 11/07/2017    History of Present Illness Pt is a 62 y/o male who presents s/p L TKA on 11/06/17. No significant PMH noted.     PT Comments    Pt progressing well with mobility and reports he has been ambulating around the unit with his family between therapy sessions. Pt has currently met acute PT goals and will be discharged from acute PT services with HHPT to follow up. Pt was educated on safe activity progression, HEP, car transfer, and general safety with mobility. Will sign off at this time. If needs change, please reconsult.    Follow Up Recommendations  DC plan and follow up therapy as arranged by surgeon;Home health PT     Equipment Recommendations  Rolling walker with 5" wheels    Recommendations for Other Services       Precautions / Restrictions Precautions Precautions: Fall;Knee Precaution Comments: Pt was educated on NO roll/ice pack/pillow under knee, ONLY under ankle.  Restrictions Weight Bearing Restrictions: Yes LLE Weight Bearing: Weight bearing as tolerated    Mobility  Bed Mobility Overal bed mobility: Modified Independent Bed Mobility: Supine to Sit           General bed mobility comments: HOB flat. No assist required.   Transfers Overall transfer level: Modified independent Equipment used: Rolling walker (2 wheeled) Transfers: Sit to/from Stand           General transfer comment: Pt demonstrated proper hand placement on seated surface for safety. No assist to power-up to full stand.   Ambulation/Gait Ambulation/Gait assistance: Modified independent (Device/Increase time) Ambulation Distance (Feet): 700 Feet Assistive device: Rolling walker (2 wheeled) Gait Pattern/deviations: Step-through pattern;Decreased stride length;Trunk flexed Gait velocity: Decreased Gait velocity interpretation: Below normal speed for  age/gender General Gait Details: VC's for improved gait technique. No unsteadiness or LOB noted.    Stairs Stairs: Yes   Stair Management: One rail Left;Step to pattern;Sideways Number of Stairs: 5 General stair comments: VC's for sequencing and general safety. Pt was able to complete without difficulty.   Wheelchair Mobility    Modified Rankin (Stroke Patients Only)       Balance Overall balance assessment: No apparent balance deficits (not formally assessed)                                          Cognition Arousal/Alertness: Awake/alert Behavior During Therapy: WFL for tasks assessed/performed Overall Cognitive Status: Within Functional Limits for tasks assessed                                        Exercises Total Joint Exercises Quad Sets: 10 reps Heel Slides: 20 reps;Seated;Supine(x10 each) Long Arc Quad: 10 reps    General Comments        Pertinent Vitals/Pain Pain Assessment: Faces Faces Pain Scale: Hurts little more Pain Location: L knee Pain Descriptors / Indicators: Operative site guarding Pain Intervention(s): Monitored during session    Home Living                      Prior Function            PT Goals (current goals can now be found in the care  plan section) Acute Rehab PT Goals Patient Stated Goal: Home today PT Goal Formulation: With patient Time For Goal Achievement: 11/14/17 Potential to Achieve Goals: Good Progress towards PT goals: Progressing toward goals    Frequency    7X/week      PT Plan Current plan remains appropriate    Co-evaluation              AM-PAC PT "6 Clicks" Daily Activity  Outcome Measure                   End of Session Equipment Utilized During Treatment: Gait belt Activity Tolerance: Patient tolerated treatment well Patient left: in chair;with call bell/phone within reach;with family/visitor present Nurse Communication: Mobility status PT  Visit Diagnosis: Unsteadiness on feet (R26.81);Other (comment)     Time: 7342-8768 PT Time Calculation (min) (ACUTE ONLY): 23 min  Charges:  $Gait Training: 8-22 mins $Therapeutic Exercise: 8-22 mins                    G Codes:       Rolinda Roan, PT, DPT Acute Rehabilitation Services Pager: [phone]    Thelma Comp 11/07/2017, 2:25 PM

## 2017-11-07 NOTE — Evaluation (Signed)
Physical Therapy Evaluation Patient Details Name: Matthew [name] DOBIE MRN: [id number] DOB: [birth year] Today's Date: 11/07/2017   History of Present Illness  Pt is a 62 y/o male who presents s/p L TKA on 11/06/17. No significant PMH noted.   Clinical Impression  Pt admitted with above diagnosis. Pt currently with functional limitations due to the deficits listed below (see PT Problem List). At the time of PT eval pt as able to perform transfers and ambulation with gross supervision for safety. Pt anticipates d/c home this afternoon. Will plan for afternoon session for stair training prior to d/c. Pt will benefit from skilled PT to increase their independence and safety with mobility to allow discharge to the venue listed below.       Follow Up Recommendations DC plan and follow up therapy as arranged by surgeon;Home health PT    Equipment Recommendations  Rolling walker with 5" wheels    Recommendations for Other Services       Precautions / Restrictions Precautions Precautions: Fall;Knee Precaution Comments: Pt was educated on NO roll/ice pack/pillow under knee, ONLY under ankle.  Restrictions Weight Bearing Restrictions: Yes LLE Weight Bearing: Weight bearing as tolerated      Mobility  Bed Mobility Overal bed mobility: Needs Assistance Bed Mobility: Supine to Sit     Supine to sit: Supervision     General bed mobility comments: Supervision for safety. pt was able to complete with HOB flat.   Transfers Overall transfer level: Needs assistance Equipment used: Rolling walker (2 wheeled) Transfers: Sit to/from Stand Sit to Stand: Supervision         General transfer comment: VC's for hand placement on seated surface for safety. No assist required.   Ambulation/Gait Ambulation/Gait assistance: Supervision;Min guard Ambulation Distance (Feet): 200 Feet Assistive device: Rolling walker (2 wheeled) Gait Pattern/deviations: Step-through pattern;Decreased stride length;Trunk  flexed Gait velocity: Decreased Gait velocity interpretation: Below normal speed for age/gender General Gait Details: Min guard progressing to supervision for safety. Pt was cued for increased heel strike, increased step/stride length, and improved posture.   Stairs            Wheelchair Mobility    Modified Rankin (Stroke Patients Only)       Balance Overall balance assessment: No apparent balance deficits (not formally assessed)                                           Pertinent Vitals/Pain Pain Assessment: Faces Faces Pain Scale: Hurts little more Pain Location: L knee Pain Descriptors / Indicators: Operative site guarding Pain Intervention(s): Limited activity within patient's tolerance;Monitored during session;Repositioned    Home Living Family/patient expects to be discharged to:: Private residence Living Arrangements: Spouse/significant other Available Help at Discharge: Family Type of Home: House Home Access: Stairs to enter   Technical brewer of Steps: 3 Home Layout: One level Home Equipment: Shower seat      Prior Function Level of Independence: Independent               Hand Dominance        Extremity/Trunk Assessment   Upper Extremity Assessment Upper Extremity Assessment: Overall WFL for tasks assessed    Lower Extremity Assessment Lower Extremity Assessment: LLE deficits/detail LLE Deficits / Details: Decreased strength and AROM consistent with above mentioned procedure    Cervical / Trunk Assessment Cervical / Trunk Assessment: Normal  Communication  Communication: No difficulties  Cognition Arousal/Alertness: Awake/alert Behavior During Therapy: WFL for tasks assessed/performed Overall Cognitive Status: Within Functional Limits for tasks assessed                                        General Comments      Exercises Total Joint Exercises Ankle Circles/Pumps: 10 reps Quad Sets:  10 reps Heel Slides: 15 reps Goniometric ROM: 17-90 AROM in sitting (L knee)   Assessment/Plan    PT Assessment Patient needs continued PT services  PT Problem List Decreased strength;Decreased range of motion;Decreased activity tolerance;Decreased balance;Decreased mobility;Decreased knowledge of use of DME;Decreased safety awareness;Decreased knowledge of precautions;Pain       PT Treatment Interventions DME instruction;Gait training;Stair training;Functional mobility training;Therapeutic activities;Therapeutic exercise;Neuromuscular re-education;Cognitive remediation;Patient/family education    PT Goals (Current goals can be found in the Care Plan section)  Acute Rehab PT Goals Patient Stated Goal: Home today PT Goal Formulation: With patient Time For Goal Achievement: 11/14/17 Potential to Achieve Goals: Good    Frequency 7X/week   Barriers to discharge        Co-evaluation               AM-PAC PT "6 Clicks" Daily Activity  Outcome Measure                  End of Session Equipment Utilized During Treatment: Gait belt Activity Tolerance: Patient tolerated treatment well Patient left: in chair;with call bell/phone within reach;with family/visitor present Nurse Communication: Mobility status PT Visit Diagnosis: Unsteadiness on feet (R26.81);Other (comment)    Time: 1103-1594 PT Time Calculation (min) (ACUTE ONLY): 34 min   Charges:   PT Evaluation $PT Eval Moderate Complexity: 1 Mod PT Treatments $Gait Training: 8-22 mins   PT G Codes:        Rolinda Roan, PT, DPT Acute Rehabilitation Services Pager: [phone]   Thelma Comp 11/07/2017, 10:20 AM

## 2017-11-07 NOTE — Anesthesia Postprocedure Evaluation (Signed)
Anesthesia Post Note  Patient: Matthew [name]  Procedure(s) Performed: TOTAL KNEE ARTHROPLASTY (Left Knee)     Patient location during evaluation: PACU Anesthesia Type: Spinal Level of consciousness: awake and alert Pain management: pain level controlled Vital Signs Assessment: post-procedure vital signs reviewed and stable Respiratory status: spontaneous breathing and respiratory function stable Cardiovascular status: blood pressure returned to baseline and stable Postop Assessment: spinal receding Anesthetic complications: no    Last Vitals:  Vitals:   11/07/17 0418 11/07/17 0941  BP: (!) 144/86 (!) 146/89  Pulse: 90 98  Resp: 18 20  Temp: 37.2 C 37.6 C  SpO2: 96% 98%    Last Pain:  Vitals:   11/07/17 1204  TempSrc:   PainSc: 6    Pain Goal:                 Nolon Nations

## 2017-11-07 NOTE — Discharge Summary (Signed)
Patient ID: Matthew [name] MRN: [id number] DOB/AGE: 1956/07/08 62 y.o.  Admit date: 11/06/2017 Discharge date: 11/07/2017  Admission Diagnoses:  Principal Problem:   Primary osteoarthritis of left knee   Discharge Diagnoses:  Same  Past Medical History:  Diagnosis Date  . Allergy    RHINITIS  . Anemia   . Arthritis   . Colonic polyp 09/2006   colonoscopy  . GERD (gastroesophageal reflux disease)   . Hypertension     Surgeries: Procedure(s):Left TOTAL KNEE ARTHROPLASTY on 11/06/2017   Consultants:   Discharged Condition: Improved  Hospital Course: WILLIAMS DIETRICK is an 62 y.o. male who was admitted 11/06/2017 for operative treatment ofPrimary osteoarthritis of left knee. Patient has severe unremitting pain that affects sleep, daily activities, and work/hobbies. After pre-op clearance the patient was taken to the operating room on 11/06/2017 and underwent  Procedure(s):Left TOTAL KNEE ARTHROPLASTY.    Patient was given perioperative antibiotics:  Anti-infectives (From admission, onward)   Start     Dose/Rate Route Frequency Ordered Stop   11/06/17 2200  ceFAZolin (ANCEF) IVPB 2g/100 mL premix     2 g 200 mL/hr over 30 Minutes Intravenous Every 6 hours 11/06/17 1942 11/07/17 0508   11/06/17 1945  valACYclovir (VALTREX) tablet 1,000 mg     1,000 mg Oral Daily 11/06/17 1942     11/06/17 1430  ceFAZolin (ANCEF) IVPB 2g/100 mL premix     2 g 200 mL/hr over 30 Minutes Intravenous To ShortStay Surgical 11/03/17 1030 11/06/17 1345       Patient was given sequential compression devices, early ambulation, and chemoprophylaxis to prevent DVT.  Patient benefited maximally from hospital stay and there were no complications.    Recent vital signs:  Patient Vitals for the past 24 hrs:  BP Temp Temp src Pulse Resp SpO2 Height Weight  11/07/17 0941 (!) 146/89 99.7 F (37.6 C) Oral 98 20 98 % - -  11/07/17 0418 (!) 144/86 98.9 F (37.2 C) Oral 90 18 96 % - -  11/06/17 1950 (!)  161/108 97.7 F (36.5 C) Oral (!) 59 16 100 % 6' (1.829 m) 96.4 kg (212 lb 8.4 oz)  11/06/17 1925 - (!) 97.4 F (36.3 C) - - - - - -  11/06/17 1924 - - - (!) 49 - (!) 83 % - -  11/06/17 1900 - - - - 14 - - -  11/06/17 1845 - - - - 15 98 % - -  11/06/17 1830 - - - (!) 55 12 100 % - -  11/06/17 1815 - - - (!) 51 15 100 % - -  11/06/17 1800 - - - (!) 52 16 100 % - -  11/06/17 1750 (!) 148/94 - - (!) 53 12 100 % - -  11/06/17 1745 - - - - 14 - - -  11/06/17 1735 (!) 148/100 - - - 15 - - -  11/06/17 1730 - - - (!) 51 15 100 % - -  11/06/17 1720 (!) 145/98 - - - 12 - - -  11/06/17 1715 - - - - 13 - - -  11/06/17 1705 (!) 139/97 - - - 12 - - -  11/06/17 1700 - (!) 97 F (36.1 C) - - 16 - - -  11/06/17 1650 (!) 136/96 - - - 12 - - -  11/06/17 1645 - - - - 14 - - -  11/06/17 1637 133/90 - - - 16 - - -  11/06/17 1630 - - - -  16 - - -  11/06/17 1620 112/75 - - 74 17 98 % - -  11/06/17 1615 - - - 68 (!) 9 99 % - -  11/06/17 1604 108/76 (!) 96.8 F (36 C) - 67 12 100 % - -  11/06/17 1320 (!) 130/95 - - 68 12 97 % - -  11/06/17 1315 (!) 139/95 - - 71 15 100 % - -  11/06/17 1310 (!) 149/97 - - 67 16 99 % - -  11/06/17 1245 (!) 150/96 - - - - - - -  11/06/17 1217 (!) 169/103 98.1 F (36.7 C) Oral 64 18 98 % 6' (1.829 m) 91.6 kg (202 lb)     Recent laboratory studies: No results for input(s): WBC, HGB, HCT, PLT, NA, K, CL, CO2, BUN, CREATININE, GLUCOSE, INR, CALCIUM in the last 72 hours.  Invalid input(s): PT, 2   Discharge Medications:   Allergies as of 11/07/2017      Reactions   Demerol Itching      Medication List    STOP taking these medications   acetaminophen 500 MG tablet Commonly known as:  TYLENOL   aspirin 81 MG tablet Replaced by:  aspirin EC 325 MG tablet   traMADol 50 MG tablet Commonly known as:  ULTRAM     TAKE these medications   allopurinol 300 MG tablet Commonly known as:  ZYLOPRIM Take 1 tablet (300 mg total) by mouth daily.   aspirin EC 325 MG  tablet Take 1 tablet (325 mg total) by mouth 2 (two) times daily after a meal. Take x 1 month post op to decrease risk of blood clots. Replaces:  aspirin 81 MG tablet   cetirizine 10 MG tablet Commonly known as:  ZYRTEC Take 10 mg by mouth daily.   docusate sodium 100 MG capsule Commonly known as:  COLACE Take 1 capsule (100 mg total) by mouth 2 (two) times daily.   fexofenadine 180 MG tablet Commonly known as:  ALLEGRA Take 180 mg by mouth daily.   fish oil-omega-3 fatty acids 1000 MG capsule Take 2 g by mouth daily.   gabapentin 300 MG capsule Commonly known as:  NEURONTIN Take 300 mg by mouth at bedtime.   hydrOXYzine 25 MG tablet Commonly known as:  ATARAX/VISTARIL TAKE 1 TABLET BY MOUTH AT BEDTIME AS NEEDED.   Iron 325 (65 Fe) MG Tabs Take 325 mg by mouth daily.   losartan-hydrochlorothiazide 50-12.5 MG tablet Commonly known as:  HYZAAR Take 1 tablet by mouth daily.   LUBRICANT EYE OP Place 1 drop into both eyes daily.   meloxicam 15 MG tablet Commonly known as:  MOBIC Take 1 tablet (15 mg total) by mouth daily.   multivitamin tablet Take 1 tablet by mouth daily.   NEXIUM 24HR 20 MG capsule Generic drug:  esomeprazole Take 40 mg by mouth daily as needed (for acid reflux).   OVER THE COUNTER MEDICATION   oxyCODONE-acetaminophen 5-325 MG tablet Commonly known as:  PERCOCET/ROXICET Take 1-2 tablets by mouth every 6 (six) hours as needed for severe pain.   tiZANidine 2 MG tablet Commonly known as:  ZANAFLEX Take 1 tablet (2 mg total) by mouth every 8 (eight) hours as needed for muscle spasms.   traZODone 50 MG tablet Commonly known as:  DESYREL Take 0.5-1 tablets (25-50 mg total) by mouth at bedtime as needed for sleep.   TURMERIC PO Take 1 capsule by mouth daily.   valACYclovir 1000 MG tablet Commonly known as:  VALTREX  TAKE 1 TABLET BY MOUTH EVERY DAY What changed:    how much to take  how to take this  when to take this   Vitamin D  (Ergocalciferol) 50000 units Caps capsule Commonly known as:  DRISDOL TAKE 1 CAPSULE (50,000 UNITS TOTAL) BY MOUTH EVERY 7 (SEVEN) DAYS. What changed:  when to take this   zolpidem 5 MG tablet Commonly known as:  AMBIEN Take 1 tablet (5 mg total) by mouth at bedtime as needed for sleep.            Discharge Care Instructions  (From admission, onward)        Start     Ordered   11/07/17 0000  Weight bearing as tolerated    Question Answer Comment  Laterality left   Extremity Lower      11/07/17 1206      Diagnostic Studies: Dg Chest 2 View  Result Date: 11/01/2017 CLINICAL DATA:  Preop. EXAM: CHEST  2 VIEW COMPARISON:  None. FINDINGS: Trachea is midline. Heart size normal. Lungs are clear. No pleural fluid. Pectus deformity. IMPRESSION: No acute findings. Electronically Signed   By: Lorin Picket M.D.   On: 11/01/2017 16:10    Disposition: 01-Home or Self Care  Discharge Instructions    CPM   Complete by:  As directed    Continuous passive motion machine (CPM):      Use the CPM from 0 to 70 for 8 hours per day.      You may increase by 5-10 per day.  You may break it up into 2 or 3 sessions per day.      Use CPM for 1-2 weeks or until you are told to stop.   Call MD / Call 911   Complete by:  As directed    If you experience chest pain or shortness of breath, CALL 911 and be transported to the hospital emergency room.  If you develope a fever above 101 F, pus (white drainage) or increased drainage or redness at the wound, or calf pain, call your surgeon's office.   Constipation Prevention   Complete by:  As directed    Drink plenty of fluids.  Prune juice may be helpful.  You may use a stool softener, such as Colace (over the counter) 100 mg twice a day.  Use MiraLax (over the counter) for constipation as needed.   Diet general   Complete by:  As directed    Do not put a pillow under the knee. Place it under the heel.   Complete by:  As directed    Increase  activity slowly as tolerated   Complete by:  As directed    Weight bearing as tolerated   Complete by:  As directed    Laterality:  left   Extremity:  Lower      Follow-up Information    Dorna Leitz, MD. Schedule an appointment as soon as possible for a visit in 2 weeks.   Specialty:  Orthopedic Surgery Contact information: [place] [address] [phone]            Signed: Erlene Senters 11/07/2017, 12:07 PM

## 2017-11-07 NOTE — Care Management Note (Signed)
Case Management Note  Patient Details  Name: OMARE BILOTTA MRN: [id number] Date of Birth: May 31, 1956  Subjective/Objective:                    Action/Plan:  Patient already has all DME including CPM at home.  Expected Discharge Date:  11/07/17               Expected Discharge Plan:  [place]  In-House Referral:  NA  Discharge planning Services  CM Consult  Post Acute Care Choice:  Durable Medical Equipment, Home Health Choice offered to:  Patient, Spouse  DME Arranged:  N/A DME Agency:  NA  HH Arranged:  PT HH Agency:  [facility]  Status of Service:  Completed, signed off  If discussed at [place] of Stay Meetings, dates discussed:    Additional Comments:  Marilu Favre, RN 11/07/2017, 1:22 PM

## 2017-11-09 ENCOUNTER — Encounter: Payer: BLUE CROSS/BLUE SHIELD | Admitting: Family Medicine

## 2017-11-09 DIAGNOSIS — M1712 Unilateral primary osteoarthritis, left knee: Secondary | ICD-10-CM | POA: Diagnosis not present

## 2017-11-09 DIAGNOSIS — Z96652 Presence of left artificial knee joint: Secondary | ICD-10-CM | POA: Diagnosis not present

## 2017-11-09 DIAGNOSIS — Z471 Aftercare following joint replacement surgery: Secondary | ICD-10-CM | POA: Diagnosis not present

## 2017-11-13 ENCOUNTER — Encounter: Payer: Self-pay | Admitting: Family Medicine

## 2017-11-14 ENCOUNTER — Telehealth: Payer: Self-pay | Admitting: Family Medicine

## 2017-11-14 NOTE — Telephone Encounter (Signed)
Discussed with him on my chart

## 2017-11-14 NOTE — Telephone Encounter (Signed)
Copied from [place] [id number]. Topic: Quick Communication - See Telephone Encounter >> Nov 14, 2017 11:39 AM Oneta Rack wrote: CRM for notification. See Telephone encounter for:   11/14/17. Caller name: Herbert Deaner Relation to pt: PT from [place]  Call back number:[phone]    Reason for call:   Would like to speak with nurse from Dr. Tamala Julian office regarding patient vitals: BP 160/100, standing 140/90, intermittent head aches, losartan-hydrochlorothiazide (HYZAAR) 50-12.5 MG tablet increase frequency 2x daily, please advise

## 2017-11-15 ENCOUNTER — Ambulatory Visit (INDEPENDENT_AMBULATORY_CARE_PROVIDER_SITE_OTHER): Payer: 59 | Admitting: Family Medicine

## 2017-11-15 ENCOUNTER — Other Ambulatory Visit (INDEPENDENT_AMBULATORY_CARE_PROVIDER_SITE_OTHER): Payer: 59

## 2017-11-15 ENCOUNTER — Other Ambulatory Visit: Payer: Self-pay

## 2017-11-15 ENCOUNTER — Encounter: Payer: Self-pay | Admitting: Family Medicine

## 2017-11-15 ENCOUNTER — Other Ambulatory Visit: Payer: Self-pay | Admitting: Family Medicine

## 2017-11-15 VITALS — BP 148/102 | HR 130 | Ht 72.0 in | Wt 199.0 lb

## 2017-11-15 DIAGNOSIS — R0602 Shortness of breath: Secondary | ICD-10-CM

## 2017-11-15 LAB — CBC WITH DIFFERENTIAL/PLATELET
Basophils Absolute: 0.1 10*3/uL (ref 0.0–0.1)
Basophils Relative: 0.7 % (ref 0.0–3.0)
EOS ABS: 0.2 10*3/uL (ref 0.0–0.7)
Eosinophils Relative: 1.9 % (ref 0.0–5.0)
HCT: 31.5 % — ABNORMAL LOW (ref 39.0–52.0)
Hemoglobin: 10.9 g/dL — ABNORMAL LOW (ref 13.0–17.0)
LYMPHS ABS: 1.5 10*3/uL (ref 0.7–4.0)
Lymphocytes Relative: 13.8 % (ref 12.0–46.0)
MCHC: 34.7 g/dL (ref 30.0–36.0)
MCV: 97.1 fl (ref 78.0–100.0)
MONO ABS: 1 10*3/uL (ref 0.1–1.0)
Monocytes Relative: 9.4 % (ref 3.0–12.0)
NEUTROS PCT: 74.2 % (ref 43.0–77.0)
Neutro Abs: 8.1 10*3/uL — ABNORMAL HIGH (ref 1.4–7.7)
Platelets: 704 10*3/uL — ABNORMAL HIGH (ref 150.0–400.0)
RBC: 3.25 Mil/uL — AB (ref 4.22–5.81)
RDW: 12.6 % (ref 11.5–15.5)
WBC: 10.9 10*3/uL — AB (ref 4.0–10.5)

## 2017-11-15 LAB — COMPREHENSIVE METABOLIC PANEL
ALBUMIN: 4.7 g/dL (ref 3.5–5.2)
ALT: 49 U/L (ref 0–53)
AST: 36 U/L (ref 0–37)
Alkaline Phosphatase: 99 U/L (ref 39–117)
BUN: 14 mg/dL (ref 6–23)
CHLORIDE: 99 meq/L (ref 96–112)
CO2: 29 mEq/L (ref 19–32)
CREATININE: 0.82 mg/dL (ref 0.40–1.50)
Calcium: 10.1 mg/dL (ref 8.4–10.5)
GFR: 101.22 mL/min (ref >60.00)
GLUCOSE: 99 mg/dL (ref 70–99)
Potassium: 3.7 mEq/L (ref 3.5–5.1)
SODIUM: 137 meq/L (ref 135–145)
Total Bilirubin: 1.1 mg/dL (ref 0.2–1.2)
Total Protein: 8.3 g/dL (ref 6.0–8.3)

## 2017-11-15 LAB — PROTIME-INR
INR: 1.1 ratio — ABNORMAL HIGH (ref 0.8–1.0)
PROTHROMBIN TIME: 12.2 s (ref 9.6–13.1)

## 2017-11-15 LAB — SEDIMENTATION RATE: Sed Rate: 28 mm/hr — ABNORMAL HIGH (ref 0–20)

## 2017-11-15 LAB — C-REACTIVE PROTEIN: CRP: 4.5 mg/dL (ref 0.5–20.0)

## 2017-11-15 LAB — URIC ACID: URIC ACID, SERUM: 3.3 mg/dL — AB (ref 4.0–7.8)

## 2017-11-15 LAB — TROPONIN I: TNIDX: 0.01 ug/L (ref 0.00–0.06)

## 2017-11-15 MED ORDER — AMLODIPINE BESYLATE 5 MG PO TABS: 5.0000 mg | ORAL_TABLET | Freq: Every day | ORAL | 3 refills | Status: DC

## 2017-11-15 NOTE — Progress Notes (Signed)
[name] [address] [place], [place] [address] Phone: [phone] Subjective:      CC: Elevated blood pressure  MGQ:[id number]  Matthew [name] is a 62 y.o. male coming in with complaint of elevated blood pressure.  Patient did have a knee replacement done November 06, 2017.  Seem to be uncomplicated.  Taking aspirin.  Has noticed with home health as well as had physical therapy patient has had significant elevation in blood pressure.  Patient did double up his losartan but has not noticed any significant improvement.  Patient states that there is some shortness of breath that is associated with it.  Some nausea with increasing activity as well.  Pain is quite severe in the knee but is taking pain medication regularly.  Last oxycodone dose was taken.  Patient is ambulating with the aid of the walker    Past Medical History:  Diagnosis Date  . Allergy    RHINITIS  . Anemia   . Arthritis   . Colonic polyp 09/2006   colonoscopy  . GERD (gastroesophageal reflux disease)   . Hypertension    Past Surgical History:  Procedure Laterality Date  . COLONOSCOPY  09/2006  . KNEE ARTHROSCOPY Left   . [place] SURGERY  1990  . TOTAL KNEE ARTHROPLASTY Left 11/06/2017  . TOTAL KNEE ARTHROPLASTY Left 11/06/2017   Procedure: TOTAL KNEE ARTHROPLASTY;  Surgeon: Dorna Leitz, MD;  Location: [facility];  Service: Orthopedics;  Laterality: Left;   Social History   Socioeconomic History  . Marital status: Married    Spouse name: Not on file  . Number of children: Not on file  . Years of education: Not on file  . Highest education level: Not on file  Social Needs  . Financial resource strain: Not on file  . Food insecurity - worry: Not on file  . Food insecurity - inability: Not on file  . Transportation needs - medical: Not on file  . Transportation needs - non-medical: Not on file  Occupational History  . Not on file  Tobacco Use  . Smoking status: Current  Some Day Smoker    Types: Cigars  . Smokeless tobacco: Never Used  Substance and Sexual Activity  . Alcohol use: Yes    Alcohol/week: 1.5 oz    Types: 3 Standard drinks or equivalent per week    Comment: occ 2-3 beers  . Drug use: No  . Sexual activity: Yes  Other Topics Concern  . Not on file  Social History Narrative  . Not on file   Allergies  Allergen Reactions  . Demerol Itching   Family History  Problem Relation Age of Onset  . Stroke Father      Past medical history, social, surgical and family history all reviewed in electronic medical record.  No pertanent information unless stated regarding to the chief complaint.   Review of Systems:Review of systems updated and as accurate as of 11/15/17  No headache, visual changes, nausea, vomiting, diarrhea, constipation, dizziness, abdominal pain, skin rash, fevers, chills, night sweats, weight loss, swollen lymph nodes, body aches, , chest pain, mood changes.  Mild shortness of breath, joint swelling, muscle aches  Objective  Blood pressure (!) 148/102, pulse (!) 130, height 6' (1.829 m), weight 199 lb (90.3 kg), SpO2 (!) 86 %. Systems examined below as of 11/15/17   General: No apparent distress alert and oriented x3 mood and affect normal, dressed appropriately.  HEENT: Pupils equal, extraocular movements intact  Respiratory: Clear to auscultation but patient does have some atelectasis noted in the right lower lobe. Cardiovascular: trace lower extremity edema, non tender, no erythema  Skin: Warm dry intact with no signs of infection or rash on extremities or on axial skeleton. Petechiae noted on back  Abdomen: Soft nontender  Neuro: Cranial nerves II through XII are intact, neurovascularly intact in all extremities with 2+ DTRs and 2+ pulses.  Lymph: No lymphadenopathy of posterior or anterior cervical chain or axillae bilaterally.  Gait using walker  MSK:  Non tender with full range of motion and good stability and  symmetric strength and tone of shoulders, elbows, wrist, hip, and ankles bilaterally.      Impression and Recommendations:     This case required medical decision making of moderate complexity.      Note: This dictation was prepared with Dragon dictation along with smaller phrase technology. Any transcriptional errors that result from this process are unintentional.

## 2017-11-15 NOTE — Patient Instructions (Addendum)
Good to see you as always  lets start the amlodipine;  Stop the aspirin and no ibuprofen.  Start the xarelto as stated in the box.  Once we get the labs you will hear from me.  We may need more testing.  See me again on Friday at 945

## 2017-11-15 NOTE — Assessment & Plan Note (Signed)
62 year old gentleman recent surgery was coming in with worsening with activity.  We discussed with patient that I am concerned with him being only on an aspirin that there is a possibility for possible deep venous thrombosis or potential pulmonary embolism.  D-dimer is pending.  Patient would like to await the labs before we would consider a CT angiogram.  We discussed if any worsening chest pain he needs to go to the emergency room immediately.  Patient is accompanied with wife at this time.  I would like to change patient's aspirin to Xarelto.  Patient given a starter pack today and will start this.  Discontinue all other anti-inflammatories.  Patient is following up with me again no in 48 hours.

## 2017-11-16 ENCOUNTER — Encounter (HOSPITAL_COMMUNITY): Payer: Self-pay | Admitting: Emergency Medicine

## 2017-11-16 ENCOUNTER — Other Ambulatory Visit: Payer: Self-pay

## 2017-11-16 ENCOUNTER — Emergency Department (HOSPITAL_COMMUNITY)
Admission: EM | Admit: 2017-11-16 | Discharge: 2017-11-16 | Disposition: A | Discharge status: Home / Self Care | Payer: 59 | Attending: Emergency Medicine | Admitting: Emergency Medicine

## 2017-11-16 ENCOUNTER — Emergency Department (HOSPITAL_BASED_OUTPATIENT_CLINIC_OR_DEPARTMENT_OTHER): Admit: 2017-11-16 | Discharge: 2017-11-16 | Disposition: A | Discharge status: Home / Self Care | Payer: 59

## 2017-11-16 ENCOUNTER — Emergency Department (HOSPITAL_COMMUNITY): Payer: 59

## 2017-11-16 DIAGNOSIS — R42 Dizziness and giddiness: Secondary | ICD-10-CM | POA: Diagnosis not present

## 2017-11-16 DIAGNOSIS — I10 Essential (primary) hypertension: Secondary | ICD-10-CM | POA: Diagnosis not present

## 2017-11-16 DIAGNOSIS — R0602 Shortness of breath: Secondary | ICD-10-CM | POA: Diagnosis not present

## 2017-11-16 DIAGNOSIS — Z79899 Other long term (current) drug therapy: Secondary | ICD-10-CM | POA: Insufficient documentation

## 2017-11-16 DIAGNOSIS — M7989 Other specified soft tissue disorders: Secondary | ICD-10-CM | POA: Diagnosis not present

## 2017-11-16 DIAGNOSIS — F17290 Nicotine dependence, other tobacco product, uncomplicated: Secondary | ICD-10-CM | POA: Diagnosis not present

## 2017-11-16 DIAGNOSIS — R7989 Other specified abnormal findings of blood chemistry: Secondary | ICD-10-CM | POA: Diagnosis not present

## 2017-11-16 DIAGNOSIS — M1712 Unilateral primary osteoarthritis, left knee: Secondary | ICD-10-CM | POA: Diagnosis not present

## 2017-11-16 LAB — CBC
HCT: 32.6 % — ABNORMAL LOW (ref 39.0–52.0)
HEMOGLOBIN: 11.1 g/dL — AB (ref 13.0–17.0)
MCH: 32.8 pg (ref 26.0–34.0)
MCHC: 34 g/dL (ref 30.0–36.0)
MCV: 96.4 fL (ref 78.0–100.0)
PLATELETS: 711 10*3/uL — AB (ref 150–400)
RBC: 3.38 MIL/uL — AB (ref 4.22–5.81)
RDW: 12.6 % (ref 11.5–15.5)
WBC: 11.3 10*3/uL — AB (ref 4.0–10.5)

## 2017-11-16 LAB — COMPREHENSIVE METABOLIC PANEL
ALBUMIN: 4.5 g/dL (ref 3.5–5.0)
ALK PHOS: 95 U/L (ref 38–126)
ALT: 50 U/L (ref 17–63)
AST: 38 U/L (ref 15–41)
Anion gap: 14 (ref 5–15)
BUN: 13 mg/dL (ref 6–20)
CALCIUM: 10.1 mg/dL (ref 8.9–10.3)
CHLORIDE: 100 mmol/L — AB (ref 101–111)
CO2: 22 mmol/L (ref 22–32)
CREATININE: 0.88 mg/dL (ref 0.61–1.24)
GFR calc Af Amer: >60 mL/min (ref >60)
GFR calc non Af Amer: >60 mL/min (ref >60)
GLUCOSE: 110 mg/dL — AB (ref 65–99)
Potassium: 3.7 mmol/L (ref 3.5–5.1)
SODIUM: 136 mmol/L (ref 135–145)
Total Bilirubin: 1.1 mg/dL (ref 0.3–1.2)
Total Protein: 8.2 g/dL — ABNORMAL HIGH (ref 6.5–8.1)

## 2017-11-16 LAB — LACTATE DEHYDROGENASE: LDH: 454 U/L — AB (ref 120–250)

## 2017-11-16 LAB — D-DIMER, QUANTITATIVE (NOT AT ARMC): D DIMER QUANT: 14.17 ug{FEU}/mL — AB (ref <0.50)

## 2017-11-16 LAB — TROPONIN I: Troponin I: <0.03 ng/mL (ref <0.03)

## 2017-11-16 MED ORDER — IOPAMIDOL (ISOVUE-370) INJECTION 76%
INTRAVENOUS | Status: AC
  Administered 2017-11-16: 100 mL
  Filled 2017-11-16: qty 100

## 2017-11-16 MED ORDER — ALBUTEROL SULFATE (2.5 MG/3ML) 0.083% IN NEBU: 5.0000 mg | INHALATION_SOLUTION | Freq: Once | RESPIRATORY_TRACT | Status: DC

## 2017-11-16 NOTE — ED Notes (Signed)
Patient transported to Ultrasound 

## 2017-11-16 NOTE — ED Notes (Signed)
Ambulated patient around the room and noted no drop in pulse oximetry. Patient denies any increased shortness of breath while ambulating. Oxygen saturation levels maintained at 100 % on room air

## 2017-11-16 NOTE — ED Triage Notes (Signed)
Pt reports feeling slightly SOB and fatigued since L knee surgery 11/06/17.  Pt reports eing sent by PCP for elevated d dimer yesterday.

## 2017-11-16 NOTE — ED Notes (Signed)
Received call from Ortho pt's D dimer is 15

## 2017-11-16 NOTE — ED Notes (Addendum)
Notified PA of orthostatic changes

## 2017-11-16 NOTE — ED Notes (Signed)
Patient able to ambulate independently  

## 2017-11-16 NOTE — ED Notes (Signed)
ED Provider at bedside. 

## 2017-11-16 NOTE — ED Notes (Signed)
Patient continues to be in at radiology/US

## 2017-11-16 NOTE — ED Provider Notes (Signed)
[facility] EMERGENCY DEPARTMENT Provider Note   CSN: [id number] Arrival date & time: 11/16/17  4081     History   Chief Complaint Chief Complaint  Patient presents with  . [name]  . Post-op Problem    HPI  Matthew [name] is a 62 y.o. Male with a history of hypertension, gout and GERD, presents to the ED for evaluation after he was called by this orthopedist due to a d-dimer of 15.  Patient had left knee replacement on 11/06/17, since then he has been feeling slightly short of breath and fatigue, with some intermittent dizziness and lightheadedness when he goes from sitting to standing.  Patient has also reported elevated blood pressures despite taking his usual losartan and even increasing the dose.  Patient denies any chest pain with the symptoms.  No syncopal episodes.  Patient reports pain in the knee with some surrounding swelling, but no obvious swelling distal to the knee, patient has been doing physical therapy with home health, and has noticed shortness of breath with increased activity.  As well as some intermittent nausea.  Patient taking regular oxycodone for knee pain and ambulating with the aid of a walker.  He was taking aspirin but no other blood thinners.  He was given a starter pack of Xarelto by his PCP yesterday and started on amlodipine in addition to his home losartan for blood pressure.      Past Medical History:  Diagnosis Date  . Allergy    RHINITIS  . Anemia   . Arthritis   . Colonic polyp 09/2006   colonoscopy  . GERD (gastroesophageal reflux disease)   . Hypertension     Patient Active Problem List   Diagnosis Date Noted  . [name] 11/15/2017  . Primary osteoarthritis of left knee 11/06/2017  . Lipoma of torso 11/02/2017  . Ganglion 11/02/2017  . Sleep deficient 03/22/2017  . Bursitis of left foot 11/11/2016  . Subluxation of tendon of long head of biceps 10/15/2016  . Leg cramping 10/15/2016  .  Hamstring tendinitis of left thigh 07/26/2016  . Gout 07/26/2016  . Osteitis pubis ([place]) 01/27/2016  . Neck tightness 08/12/2015  . Nonallopathic lesion of cervical region 08/12/2015  . Nonallopathic lesion of thoracic region 08/12/2015  . Nonallopathic lesion-rib cage 08/12/2015  . ACE-inhibitor cough 08/12/2015  . GERD (gastroesophageal reflux disease) 08/01/2014  . Neck pain 03/03/2014  . Hypertension 02/14/2014  . Allergic rhinitis 02/14/2014  . Herpes genitalis in men 02/14/2014  . Personal history of colonic polyps 05/06/2011  . Pes planus 04/30/2008    Past Surgical History:  Procedure Laterality Date  . COLONOSCOPY  09/2006  . KNEE ARTHROSCOPY Left   . [place] SURGERY  1990  . TOTAL KNEE ARTHROPLASTY Left 11/06/2017  . TOTAL KNEE ARTHROPLASTY Left 11/06/2017   Procedure: TOTAL KNEE ARTHROPLASTY;  Surgeon: Dorna Leitz, MD;  Location: [facility];  Service: Orthopedics;  Laterality: Left;       Home Medications    Prior to Admission medications   Medication Sig Start Date End Date Taking? Authorizing Provider  allopurinol (ZYLOPRIM) 300 MG tablet Take 1 tablet (300 mg total) by mouth daily. 11/03/17   Denita Lung, MD  amLODipine (NORVASC) 5 MG tablet Take 1 tablet (5 mg total) by mouth daily. 11/15/17   Lyndal Pulley, DO  Artificial Tear Ointment (LUBRICANT EYE OP) Place 1 drop into both eyes daily.    [provider]  aspirin EC  325 MG tablet Take 1 tablet (325 mg total) by mouth 2 (two) times daily after a meal. Take x 1 month post op to decrease risk of blood clots. 11/06/17   Gary Fleet, PA-C  docusate sodium (COLACE) 100 MG capsule Take 1 capsule (100 mg total) by mouth 2 (two) times daily. 11/06/17   Gary Fleet, PA-C  esomeprazole (NEXIUM 24HR) 20 MG capsule Take 40 mg by mouth daily as needed (for acid reflux).    [provider]  Ferrous Sulfate (IRON) 325 (65 FE) MG TABS Take 325 mg by mouth daily.     [provider]    fexofenadine (ALLEGRA) 180 MG tablet Take 180 mg by mouth daily.    [provider]  fish oil-omega-3 fatty acids 1000 MG capsule Take 2 g by mouth daily.     [provider]  gabapentin (NEURONTIN) 300 MG capsule Take 300 mg by mouth at bedtime.    [provider]  hydrOXYzine (ATARAX/VISTARIL) 25 MG tablet TAKE 1 TABLET BY MOUTH AT BEDTIME AS NEEDED. 10/17/17   Lyndal Pulley, DO  losartan-hydrochlorothiazide (HYZAAR) 50-12.5 MG tablet Take 1 tablet by mouth daily. 11/03/17   Denita Lung, MD  meloxicam (MOBIC) 15 MG tablet Take 1 tablet (15 mg total) by mouth daily. 03/22/17   Lyndal Pulley, DO  Multiple Vitamin (MULTIVITAMIN) tablet Take 1 tablet by mouth daily.      [provider]  OVER THE COUNTER MEDICATION     [provider]  oxyCODONE-acetaminophen (PERCOCET/ROXICET) 5-325 MG tablet Take 1-2 tablets by mouth every 6 (six) hours as needed for severe pain. 11/06/17   Gary Fleet, PA-C  tiZANidine (ZANAFLEX) 2 MG tablet Take 1 tablet (2 mg total) by mouth every 8 (eight) hours as needed for muscle spasms. 11/06/17   Gary Fleet, PA-C  traZODone (DESYREL) 50 MG tablet Take 0.5-1 tablets (25-50 mg total) by mouth at bedtime as needed for sleep. 08/25/17   Lyndal Pulley, DO  TURMERIC PO Take 1 capsule by mouth daily.    [provider]  valACYclovir (VALTREX) 1000 MG tablet TAKE 1 TABLET BY MOUTH EVERY DAY Patient taking differently: TAKE 1000 MG BY MOUTH EVERY DAY 06/02/17   Denita Lung, MD  Vitamin D, Ergocalciferol, (DRISDOL) 50000 units CAPS capsule TAKE 1 CAPSULE (50,000 UNITS TOTAL) BY MOUTH EVERY 7 (SEVEN) DAYS. Patient taking differently: Take 50,000 Units by mouth every Sunday.  09/04/17   Lyndal Pulley, DO    Family History Family History  Problem Relation Age of Onset  . Stroke Father     Social History Social History   Tobacco Use  . Smoking status: Current Some Day Smoker    Types: Cigars  .  Smokeless tobacco: Never Used  Substance Use Topics  . Alcohol use: Yes    Alcohol/week: 1.5 oz    Types: 3 Standard drinks or equivalent per week    Comment: occ 2-3 beers  . Drug use: No     Allergies   Asa [aspirin] and Demerol   Review of Systems Review of Systems  Constitutional: Negative for chills and fever.  HENT: Negative for congestion, rhinorrhea and sore throat.   Respiratory: Positive for shortness of breath. Negative for cough, chest tightness, wheezing and stridor.   Cardiovascular: Positive for leg swelling. Negative for chest pain and palpitations.  Gastrointestinal: Negative for abdominal pain, nausea and vomiting.  Musculoskeletal: Positive for arthralgias (L knee, post-op) and joint swelling.  Skin:  Negative for rash.  Neurological: Positive for light-headedness. Negative for dizziness and weakness.  All other systems reviewed and are negative.    Physical Exam Updated Vital Signs BP (!) 157/99 (BP Location: Right Arm)   Pulse (!) 117   Temp 98.7 F (37.1 C) (Oral)   Resp (!) 22   SpO2 96%   Physical Exam  Constitutional: He appears well-developed and well-nourished. No distress.  HENT:  Head: Normocephalic and atraumatic.  Mouth/Throat: Oropharynx is clear and moist.  Eyes: Right eye exhibits no discharge. Left eye exhibits no discharge.  Neck: Neck supple.  Cardiovascular: Normal rate, regular rhythm, normal heart sounds and intact distal pulses.  Pulses:      Radial pulses are 2+ on the right side, and 2+ on the left side.       Dorsalis pedis pulses are 2+ on the right side, and 2+ on the left side.       Posterior tibial pulses are 2+ on the right side, and 2+ on the left side.  Pulmonary/Chest: Effort normal and breath sounds normal. No stridor. No respiratory distress. He has no wheezes. He has no rales.  Abdominal: Soft. Bowel sounds are normal. He exhibits no distension. There is no tenderness.  Musculoskeletal:  Swelling of left knee,  no erythema or warmth, surgical bandages still in place, no swelling proximal or distal to the knee, no tenderness to palpation of the calf, normal flexion and extension with some pain Right lower extremity is unremarkable, no appreciable swelling Bilateral DP pulses 2+, sensation and motor function intact  Neurological: He is alert. Coordination normal.  Skin: He is not diaphoretic.  Psychiatric: He has a normal mood and affect. His behavior is normal.  Nursing note and vitals reviewed.    ED Treatments / Results  Labs (all labs ordered are listed, but only abnormal results are displayed) Labs Reviewed  CBC - Abnormal; Notable for the following components:      Result Value   WBC 11.3 (*)    RBC 3.38 (*)    Hemoglobin 11.1 (*)    HCT 32.6 (*)    Platelets 711 (*)    All other components within normal limits  COMPREHENSIVE METABOLIC PANEL - Abnormal; Notable for the following components:   Chloride 100 (*)    Glucose, Bld 110 (*)    Total Protein 8.2 (*)    All other components within normal limits  TROPONIN I    EKG  EKG Interpretation  Date/Time:  Thursday November 16 2017 08:55:01 EST Ventricular Rate:  99 PR Interval:  162 QRS Duration: 88 QT Interval:  338 QTC Calculation: 433 R Axis:   -45 Text Interpretation:  Normal sinus rhythm Possible Left atrial enlargement Left anterior fascicular block Septal infarct , age undetermined Abnormal ECG Confirmed by Fredia Sorrow [id number]) on 11/16/2017 11:31:07 AM       Radiology Ct Angio Chest Pe W And/or Wo Contrast  Result Date: 11/16/2017 CLINICAL DATA:  [name].  Elevated D-dimer level. EXAM: CT ANGIOGRAPHY CHEST WITH CONTRAST TECHNIQUE: Multidetector CT imaging of the chest was performed using the standard protocol during bolus administration of intravenous contrast. Multiplanar CT image reconstructions and MIPs were obtained to evaluate the vascular anatomy. CONTRAST:  141mL ISOVUE-370 IOPAMIDOL (ISOVUE-370)  INJECTION 76% COMPARISON:  None. FINDINGS: Cardiovascular: Satisfactory opacification of the pulmonary arteries to the segmental level. No evidence of pulmonary embolism. Normal heart size. No pericardial effusion. Mediastinum/Nodes: No enlarged mediastinal, hilar, or axillary lymph nodes. Thyroid  gland, trachea, and esophagus demonstrate no significant findings. Lungs/Pleura: Lungs are clear. No pleural effusion or pneumothorax. Upper Abdomen: No acute abnormality. Musculoskeletal: No chest wall abnormality. No acute or significant osseous findings. Review of the MIP images confirms the above findings. IMPRESSION: No definite evidence of pulmonary embolus. No abnormality seen in the chest. Electronically Signed   By: Marijo Conception, M.D.   On: 11/16/2017 10:16    Procedures Procedures (including critical care time)  Medications Ordered in ED Medications  iopamidol (ISOVUE-370) 76 % injection (100 mLs  Contrast Given 11/16/17 0954)     Initial Impression / Assessment and Plan / ED Course  I have reviewed the triage vital signs and the nursing notes.  Pertinent labs & imaging results that were available during my care of the patient were reviewed by me and considered in my medical decision making (see chart for details).  Patient presents to the ED for evaluation of shortness of breath, contacted by his PCP yesterday for elevated d-dimer to 15.  Denies chest pain.  On initial evaluation patient with mild tachycardia and hypertension, no hypoxia or tachypnea and patient is afebrile.  On exam today patient is well-appearing, no increased work of breathing, lungs clear to auscultation bilaterally.  Left lower extremity with mild swelling moderate swelling surrounding the left knee, surgical bandages in place, no erythema or warmth, no appreciable swelling distal or proximal to the surgical site.  Labs and CT angio PE study pending.  CBC shows very minimal leukocytosis, hemoglobin stable, no  electrolyte derangements requiring intervention.  Troponin negative and EKG without significant changes.  CT PE study shows no evidence of pulmonary embolus or other acute findings in the lung.  Will order DVT studies to rule out other source of blood clot given elevated d-dimer yesterday.  11:09 AM Called by vascular ultrasound, LE venous study negative for DVT.  Patient ambulated in the hallway and maintained normal pulse ox throughout, patient also reports he better walking around without shortness of breath, lightheadedness or dizziness.  Patient did have drop in blood pressure with orthostatic vital signs what was completely asymptomatic with this.  Patient has follow-up appointment with his PCP tomorrow morning at 9:45.  He will continue taking Xarelto and new blood pressure medications as prescribed until he follows up with Dr. Tamala Julian.  Strict return precautions discussed.  At discharge vitals are stable and patient is in no acute distress, expresses understanding and is in agreement with plan.  Patient discussed with Dr. Rogene Houston who is in agreement with plan.  Final Clinical Impressions(s) / ED Diagnoses   Final diagnoses:  [name]    ED Discharge Orders    None       Janet Berlin 11/16/17 2117    Fredia Sorrow, MD 11/18/17 [phone]

## 2017-11-16 NOTE — Progress Notes (Signed)
Left lower extremity venous duplex has been completed. Negative for DVT. Results were given to [facility]  11/16/17 11:10 AM Carlos Levering RVT

## 2017-11-16 NOTE — Discharge Instructions (Signed)
Your evaluation today has been reassuring, DVT study and CT of the chest show no evidence of blood clots, lab work and EKG showed no evidence of acute cardiac or pulmonary issues.  Please follow-up at your appointment with Dr. Tamala Julian tomorrow and continue to take your Xarelto and blood pressure medications as directed.  If you have worsening shortness of breath or develop chest pain or other new or concerning symptoms please return to the ED for reevaluation.

## 2017-11-16 NOTE — ED Notes (Signed)
PA at bedside.

## 2017-11-17 ENCOUNTER — Encounter: Payer: Self-pay | Admitting: Family Medicine

## 2017-11-17 ENCOUNTER — Ambulatory Visit: Payer: 59 | Admitting: Family Medicine

## 2017-11-17 ENCOUNTER — Ambulatory Visit (INDEPENDENT_AMBULATORY_CARE_PROVIDER_SITE_OTHER): Payer: 59 | Admitting: Family Medicine

## 2017-11-17 VITALS — BP 142/88 | HR 74 | Ht 72.0 in | Wt 199.0 lb

## 2017-11-17 DIAGNOSIS — I10 Essential (primary) hypertension: Secondary | ICD-10-CM | POA: Diagnosis not present

## 2017-11-17 DIAGNOSIS — M1712 Unilateral primary osteoarthritis, left knee: Secondary | ICD-10-CM | POA: Diagnosis not present

## 2017-11-17 DIAGNOSIS — Z0130 Encounter for examination of blood pressure without abnormal findings: Secondary | ICD-10-CM

## 2017-11-17 DIAGNOSIS — R031 Nonspecific low blood-pressure reading: Secondary | ICD-10-CM

## 2017-11-17 NOTE — Assessment & Plan Note (Signed)
Status post replacement.  Continues to have pain and swelling. His only 15 days from his surgery ago.  Was concerned more for the possibility of pulmonary embolism.  We will continue on the Xarelto 10 mg daily for 35 days for prophylactic treatment.  Patient seem to be having petechiae from the aspirin.  Seems to have improved even 1 day off of it.  Continues to have some limited range of motion which could be likely normal.  Patient will follow up with me again in 2 weeks for hypertension.

## 2017-11-17 NOTE — Progress Notes (Signed)
[name] Matthew [name], [place] [address] Phone: [phone] Subjective:    I'm seeing this patient by the request  of:    CC: Hypertension  [id number]  Matthew [name] is a 62 y.o. male coming in with complaint of hypertension.  Patient was having elevated blood pressure.  Was found to have an elevated d-dimer  Lab Results  Component Value Date   DDIMER 14.17 (H) 11/15/2017    and sent for emergency for CT angiogram done.  This was negative for any pulmonary embolism. Patient states that he has been monitoring his blood pressure at home which continues to be elevated. He also complains of dizziness.  Patient states some mornings overall seems to be may be getting a little better.  is left knee is bothering him due to the swelling in the joint.      Past Medical History:  Diagnosis Date  . Allergy    RHINITIS  . Anemia   . Arthritis   . Colonic polyp 09/2006   colonoscopy  . GERD (gastroesophageal reflux disease)   . Hypertension    Past Surgical History:  Procedure Laterality Date  . COLONOSCOPY  09/2006  . KNEE ARTHROSCOPY Left   . [place] SURGERY  1990  . TOTAL KNEE ARTHROPLASTY Left 11/06/2017  . TOTAL KNEE ARTHROPLASTY Left 11/06/2017   Procedure: TOTAL KNEE ARTHROPLASTY;  Surgeon: Dorna Leitz, MD;  Location: [facility];  Service: Orthopedics;  Laterality: Left;   Social History   Socioeconomic History  . Marital status: Married    Spouse name: None  . Number of children: None  . Years of education: None  . Highest education level: None  Social Needs  . Financial resource strain: None  . Food insecurity - worry: None  . Food insecurity - inability: None  . Transportation needs - medical: None  . Transportation needs - non-medical: None  Occupational History  . None  Tobacco Use  . Smoking status: Current Some Day Smoker    Types: Cigars  . Smokeless tobacco: Never Used  Substance and Sexual Activity  .  Alcohol use: Yes    Alcohol/week: 1.5 oz    Types: 3 Standard drinks or equivalent per week    Comment: occ 2-3 beers  . Drug use: No  . Sexual activity: Yes  Other Topics Concern  . None  Social History Narrative  . None   Allergies  Allergen Reactions  . Asa [Aspirin] Rash  . Demerol Itching   Family History  Problem Relation Age of Onset  . Stroke Father      Past medical history, social, surgical and family history all reviewed in electronic medical record.  No pertanent information unless stated regarding to the chief complaint.   Review of Systems:Review of systems updated and as accurate as of 11/17/17  No headache, visual changes, nausea, vomiting, diarrhea, constipation, dizziness, abdominal pain, skin rash, fevers, chills, night sweats, weight loss, swollen lymph nodes, body aches muscle aches, chest pain, shortness of breath, mood changes.  Positive joint swelling, headaches  Objective  Blood pressure (!) 142/88, pulse 74, height 6' (1.829 m), weight 199 lb (90.3 kg), SpO2 93 %. Systems examined below as of 11/17/17   General: No apparent distress alert and oriented x3 mood and affect normal, dressed appropriately.  HEENT: Pupils equal, extraocular movements intact  Respiratory: Patient's speak in full sentences and does not appear short of breath  Cardiovascular: Trace lower extremity edema, non  tender, no erythema  Skin: Warm dry intact with no signs of infection or rash on extremities or on axial skeleton.  Abdomen: Soft nontender  Neuro: Cranial nerves II through XII are intact, neurovascularly intact in all extremities with 2+ DTRs and 2+ pulses.  Lymph: No lymphadenopathy of posterior or anterior cervical chain or axillae bilaterally.  Gait antalgic gait using cane MSK:  Non tender with full range of motion and good stability and symmetric strength and tone of shoulders, elbows, wrist, hip, and ankles bilaterally.  Patient's left knee is swollen.  Mild warm  to the touch.  Effusion noted.  Decreased range of motion lacking last 10 degrees of extension and only has flexion of 50 degrees.  No significant instability though noted.  Neurovascularly intact distally.  Negative pain with compression of the calf   Impression and Recommendations:     This case required medical decision making of moderate complexity.      Note: This dictation was prepared with Dragon dictation along with smaller phrase technology. Any transcriptional errors that result from this process are unintentional.

## 2017-11-17 NOTE — Patient Instructions (Signed)
Good to see you  Labs Monday  Try the compression  Ice ice baby  Lets see you again in 2 weeks for the blood pressure.

## 2017-11-20 ENCOUNTER — Other Ambulatory Visit: Payer: Self-pay | Admitting: Family Medicine

## 2017-11-20 ENCOUNTER — Other Ambulatory Visit (INDEPENDENT_AMBULATORY_CARE_PROVIDER_SITE_OTHER): Payer: 59

## 2017-11-20 DIAGNOSIS — Z0130 Encounter for examination of blood pressure without abnormal findings: Secondary | ICD-10-CM

## 2017-11-20 LAB — CBC WITH DIFFERENTIAL/PLATELET
BASOS PCT: 0.5 % (ref 0.0–3.0)
Basophils Absolute: 0.1 10*3/uL (ref 0.0–0.1)
EOS PCT: 2.2 % (ref 0.0–5.0)
Eosinophils Absolute: 0.2 10*3/uL (ref 0.0–0.7)
HEMATOCRIT: 31.8 % — AB (ref 39.0–52.0)
HEMOGLOBIN: 11.1 g/dL — AB (ref 13.0–17.0)
LYMPHS PCT: 13.1 % (ref 12.0–46.0)
Lymphs Abs: 1.3 10*3/uL (ref 0.7–4.0)
MCHC: 34.7 g/dL (ref 30.0–36.0)
MCV: 95.8 fl (ref 78.0–100.0)
Monocytes Absolute: 0.9 10*3/uL (ref 0.1–1.0)
Monocytes Relative: 9.4 % (ref 3.0–12.0)
NEUTROS ABS: 7.2 10*3/uL (ref 1.4–7.7)
Neutrophils Relative %: 74.8 % (ref 43.0–77.0)
PLATELETS: 921 10*3/uL — AB (ref 150.0–400.0)
RBC: 3.32 Mil/uL — ABNORMAL LOW (ref 4.22–5.81)
RDW: 12.5 % (ref 11.5–15.5)
WBC: 9.7 10*3/uL (ref 4.0–10.5)

## 2017-11-20 NOTE — Telephone Encounter (Signed)
Refill denied 

## 2017-11-21 DIAGNOSIS — M25662 Stiffness of left knee, not elsewhere classified: Secondary | ICD-10-CM | POA: Diagnosis not present

## 2017-11-21 DIAGNOSIS — Z96652 Presence of left artificial knee joint: Secondary | ICD-10-CM | POA: Diagnosis not present

## 2017-11-21 DIAGNOSIS — M25562 Pain in left knee: Secondary | ICD-10-CM | POA: Diagnosis not present

## 2017-11-22 DIAGNOSIS — M25662 Stiffness of left knee, not elsewhere classified: Secondary | ICD-10-CM | POA: Diagnosis not present

## 2017-11-22 DIAGNOSIS — M25562 Pain in left knee: Secondary | ICD-10-CM | POA: Diagnosis not present

## 2017-11-22 DIAGNOSIS — Z96652 Presence of left artificial knee joint: Secondary | ICD-10-CM | POA: Diagnosis not present

## 2017-11-23 DIAGNOSIS — M25462 Effusion, left knee: Secondary | ICD-10-CM | POA: Diagnosis not present

## 2017-11-23 DIAGNOSIS — M25562 Pain in left knee: Secondary | ICD-10-CM | POA: Diagnosis not present

## 2017-11-27 DIAGNOSIS — M25662 Stiffness of left knee, not elsewhere classified: Secondary | ICD-10-CM | POA: Diagnosis not present

## 2017-11-27 DIAGNOSIS — M25562 Pain in left knee: Secondary | ICD-10-CM | POA: Diagnosis not present

## 2017-11-27 DIAGNOSIS — Z96652 Presence of left artificial knee joint: Secondary | ICD-10-CM | POA: Diagnosis not present

## 2017-11-28 ENCOUNTER — Encounter: Payer: Self-pay | Admitting: Internal Medicine

## 2017-11-29 DIAGNOSIS — M25562 Pain in left knee: Secondary | ICD-10-CM | POA: Diagnosis not present

## 2017-11-29 DIAGNOSIS — L570 Actinic keratosis: Secondary | ICD-10-CM | POA: Diagnosis not present

## 2017-11-29 DIAGNOSIS — M25662 Stiffness of left knee, not elsewhere classified: Secondary | ICD-10-CM | POA: Diagnosis not present

## 2017-11-29 DIAGNOSIS — D044 Carcinoma in situ of skin of scalp and neck: Secondary | ICD-10-CM | POA: Diagnosis not present

## 2017-11-29 DIAGNOSIS — Z96652 Presence of left artificial knee joint: Secondary | ICD-10-CM | POA: Diagnosis not present

## 2017-11-29 DIAGNOSIS — X32XXXA Exposure to sunlight, initial encounter: Secondary | ICD-10-CM | POA: Diagnosis not present

## 2017-11-29 DIAGNOSIS — Z1283 Encounter for screening for malignant neoplasm of skin: Secondary | ICD-10-CM | POA: Diagnosis not present

## 2017-11-29 DIAGNOSIS — D225 Melanocytic nevi of trunk: Secondary | ICD-10-CM | POA: Diagnosis not present

## 2017-12-01 DIAGNOSIS — M25562 Pain in left knee: Secondary | ICD-10-CM | POA: Diagnosis not present

## 2017-12-01 DIAGNOSIS — M25662 Stiffness of left knee, not elsewhere classified: Secondary | ICD-10-CM | POA: Diagnosis not present

## 2017-12-01 DIAGNOSIS — Z96652 Presence of left artificial knee joint: Secondary | ICD-10-CM | POA: Diagnosis not present

## 2017-12-02 ENCOUNTER — Other Ambulatory Visit: Payer: Self-pay | Admitting: Family Medicine

## 2017-12-05 ENCOUNTER — Other Ambulatory Visit: Payer: Self-pay | Admitting: Family Medicine

## 2017-12-06 DIAGNOSIS — Z96652 Presence of left artificial knee joint: Secondary | ICD-10-CM | POA: Diagnosis not present

## 2017-12-06 DIAGNOSIS — M25562 Pain in left knee: Secondary | ICD-10-CM | POA: Diagnosis not present

## 2017-12-06 DIAGNOSIS — M25662 Stiffness of left knee, not elsewhere classified: Secondary | ICD-10-CM | POA: Diagnosis not present

## 2017-12-07 ENCOUNTER — Telehealth: Payer: Self-pay | Admitting: Family Medicine

## 2017-12-07 NOTE — Telephone Encounter (Signed)
[facility] called from [phone] and left message that they had recd a bill for 500.00 from [facility] [place]. Called [facility] spoke with Sophia [phone]  And she said the claim was still pending and marked claim development and that the pt should not have a bill yet, maybe an EOB.  That the final bill will be 9.00 and 31.23.  Called Vaughan Basta reached voice mail advised of same.

## 2017-12-08 ENCOUNTER — Telehealth: Payer: Self-pay | Admitting: Family Medicine

## 2017-12-08 ENCOUNTER — Encounter: Payer: Self-pay | Admitting: Internal Medicine

## 2017-12-08 DIAGNOSIS — M25662 Stiffness of left knee, not elsewhere classified: Secondary | ICD-10-CM | POA: Diagnosis not present

## 2017-12-08 DIAGNOSIS — Z96652 Presence of left artificial knee joint: Secondary | ICD-10-CM | POA: Diagnosis not present

## 2017-12-08 DIAGNOSIS — M25562 Pain in left knee: Secondary | ICD-10-CM | POA: Diagnosis not present

## 2017-12-08 NOTE — Telephone Encounter (Signed)
Pt was give a rx on 11/03/2017 # 68 with 3 refills , pt doesn't need this refill.

## 2017-12-08 NOTE — Telephone Encounter (Signed)
Fax refill request  Losartan-hctz 50-12.5 mg

## 2017-12-11 ENCOUNTER — Other Ambulatory Visit: Payer: Self-pay | Admitting: Family Medicine

## 2017-12-12 DIAGNOSIS — M25562 Pain in left knee: Secondary | ICD-10-CM | POA: Diagnosis not present

## 2017-12-14 ENCOUNTER — Other Ambulatory Visit (INDEPENDENT_AMBULATORY_CARE_PROVIDER_SITE_OTHER): Payer: 59

## 2017-12-14 ENCOUNTER — Ambulatory Visit (INDEPENDENT_AMBULATORY_CARE_PROVIDER_SITE_OTHER): Payer: 59 | Admitting: Family Medicine

## 2017-12-14 ENCOUNTER — Encounter: Payer: Self-pay | Admitting: Family Medicine

## 2017-12-14 VITALS — BP 138/90 | HR 86

## 2017-12-14 DIAGNOSIS — I10 Essential (primary) hypertension: Secondary | ICD-10-CM | POA: Diagnosis not present

## 2017-12-14 DIAGNOSIS — M25562 Pain in left knee: Secondary | ICD-10-CM | POA: Diagnosis not present

## 2017-12-14 DIAGNOSIS — R009 Unspecified abnormalities of heart beat: Secondary | ICD-10-CM | POA: Diagnosis not present

## 2017-12-14 DIAGNOSIS — M2550 Pain in unspecified joint: Secondary | ICD-10-CM

## 2017-12-14 DIAGNOSIS — Z96652 Presence of left artificial knee joint: Secondary | ICD-10-CM | POA: Diagnosis not present

## 2017-12-14 DIAGNOSIS — M25662 Stiffness of left knee, not elsewhere classified: Secondary | ICD-10-CM | POA: Diagnosis not present

## 2017-12-14 HISTORY — DX: Unspecified abnormalities of heart beat: R00.9

## 2017-12-14 LAB — CBC WITH DIFFERENTIAL/PLATELET
BASOS ABS: 0.1 10*3/uL (ref 0.0–0.1)
Basophils Relative: 0.9 % (ref 0.0–3.0)
Eosinophils Absolute: 0.3 10*3/uL (ref 0.0–0.7)
Eosinophils Relative: 4.6 % (ref 0.0–5.0)
HCT: 39.9 % (ref 39.0–52.0)
Hemoglobin: 13.2 g/dL (ref 13.0–17.0)
LYMPHS ABS: 1.3 10*3/uL (ref 0.7–4.0)
Lymphocytes Relative: 19.5 % (ref 12.0–46.0)
MCHC: 33.2 g/dL (ref 30.0–36.0)
MCV: 95.6 fl (ref 78.0–100.0)
MONOS PCT: 11.7 % (ref 3.0–12.0)
Monocytes Absolute: 0.8 10*3/uL (ref 0.1–1.0)
NEUTROS PCT: 63.3 % (ref 43.0–77.0)
Neutro Abs: 4.2 10*3/uL (ref 1.4–7.7)
Platelets: 417 10*3/uL — ABNORMAL HIGH (ref 150.0–400.0)
RBC: 4.17 Mil/uL — AB (ref 4.22–5.81)
RDW: 13.5 % (ref 11.5–15.5)
WBC: 6.7 10*3/uL (ref 4.0–10.5)

## 2017-12-14 LAB — T3, FREE: T3 FREE: 3.9 pg/mL (ref 2.3–4.2)

## 2017-12-14 LAB — TSH: TSH: 1.62 u[IU]/mL (ref 0.35–4.50)

## 2017-12-14 LAB — T4, FREE: FREE T4: 0.75 ng/dL (ref 0.60–1.60)

## 2017-12-14 LAB — SEDIMENTATION RATE: SED RATE: 26 mm/h — AB (ref 0–20)

## 2017-12-14 MED ORDER — AMLODIPINE BESYLATE 10 MG PO TABS: 10.0000 mg | ORAL_TABLET | Freq: Every day | ORAL | 1 refills | Status: DC

## 2017-12-14 MED ORDER — LOSARTAN POTASSIUM-HCTZ 100-25 MG PO TABS: 1.0000 | ORAL_TABLET | Freq: Every day | ORAL | 3 refills | Status: DC

## 2017-12-14 NOTE — Patient Instructions (Addendum)
Good to see you  We will get you in with cardiology soon.  Amlodipine now 10 mg at night Losartan in the morning.  Lets do get labs today again as well.  See me again in 2-3 weeks  Send a message in a week

## 2017-12-14 NOTE — Assessment & Plan Note (Signed)
Hypertension.  Laboratory workup given for possible thyroid disease.  Increase amlodipine.  Change time of dosing of the medication as well.  Patient will monitor blood pressure closer.  Patient is also being referred to cardiology for this as well as abnormal heart rhythm.  Patient knows if any worsening chest pain to go to the emergency room immediately.  Will follow up with me again in 2 weeks.

## 2017-12-14 NOTE — Assessment & Plan Note (Signed)
Regularly irregular heart rate with patient dropping every seventh beat.  Does not appear to be in atrial fibrillation.  Strong pulses proximally and distally.  Mild 2 out of 6 systolic ejection murmur heard but seem to be benign.  We discussed possible EKG but patient would rather have referral to cardiology with a will do an EKG as well.  Patient given warning signs and symptoms and when to seek medical attention sooner.  Otherwise patient will follow-up with cardiology knowing that likely may have a stress test in the near future.

## 2017-12-14 NOTE — Progress Notes (Signed)
[name] [place], [place] [address] Phone: [phone] Subjective:     CC: High blood pressure  [id number]  Matthew [name] is a 62 y.o. male coming in with complaint of hypertension.  Patient did have a knee surgery previously.  See his patient's previous notes.  Has noticed that he is having increasing blood pressure again.  Has been doing formal physical therapy.  We increased patient medications to losartan 100 mg and started him on amlodipine after last exam.  Still not feeling like he is making progress.  Highest blood pressure readings are in the morning.  Now associated with some lightheadedness and some.  Denies any chest pain.  Patient was found to have elevated d-dimer that was secondary to inflammation.  Patient has discontinued Xarelto and other blood thinners including aspirin at this time.  No new medications other than the amlodipine.     Past Medical History:  Diagnosis Date  . Allergy    RHINITIS  . Anemia   . Arthritis   . Colonic polyp 09/2006   colonoscopy  . GERD (gastroesophageal reflux disease)   . Hypertension    Past Surgical History:  Procedure Laterality Date  . COLONOSCOPY  09/2006  . KNEE ARTHROSCOPY Left   . [place] SURGERY  1990  . TOTAL KNEE ARTHROPLASTY Left 11/06/2017  . TOTAL KNEE ARTHROPLASTY Left 11/06/2017   Procedure: TOTAL KNEE ARTHROPLASTY;  Surgeon: Dorna Leitz, MD;  Location: [facility];  Service: Orthopedics;  Laterality: Left;   Social History   Socioeconomic History  . Marital status: Married    Spouse name: None  . Number of children: None  . Years of education: None  . Highest education level: None  Social Needs  . Financial resource strain: None  . Food insecurity - worry: None  . Food insecurity - inability: None  . Transportation needs - medical: None  . Transportation needs - non-medical: None  Occupational History  . None  Tobacco Use  . Smoking status: Current  Some Day Smoker    Types: Cigars  . Smokeless tobacco: Never Used  Substance and Sexual Activity  . Alcohol use: Yes    Alcohol/week: 1.5 oz    Types: 3 Standard drinks or equivalent per week    Comment: occ 2-3 beers  . Drug use: No  . Sexual activity: Yes  Other Topics Concern  . None  Social History Narrative  . None   Allergies  Allergen Reactions  . Asa [Aspirin] Rash  . Demerol Itching   Family History  Problem Relation Age of Onset  . Stroke Father      Past medical history, social, surgical and family history all reviewed in electronic medical record.  No pertanent information unless stated regarding to the chief complaint.   Review of Systems:Review of systems updated and as accurate as of 12/14/60  No vomiting, diarrhea, constipation, dizziness, abdominal pain, skin rash, fevers, chills, night sweats, weight loss, swollen lymph nodes, body aches, joint swelling,  chest pain, shortness of breath, mood changes.  Positive headache, visual changes, muscle aches and nausea  Objective  Blood pressure 138/90, pulse 86, SpO2 97 %. Systems examined below as of 12/14/60   General: No apparent distress alert and oriented x3 mood and affect normal, dressed appropriately.  HEENT: Pupils equal, extraocular movements intact  Respiratory: Patient's speak in full sentences and does not appear short of breath  Cardiovascular: Trace lower extremity edema noted.  Heart  has irregularly regular rhythm with a 2 out of 6 systolic ejection murmur that seems to be benign.  Patient is dropping every seventh beat. Skin: Warm dry intact with no signs of infection or rash on extremities or on axial skeleton.  Abdomen: Soft nontender  Neuro: Cranial nerves II through XII are intact, neurovascularly intact in all extremities with 2+ DTRs and 2+ pulses.  Lymph: No lymphadenopathy of posterior or anterior cervical chain or axillae bilaterally.  Gait mild antalgic gait MSK:  Non tender with full  range of motion and good stability and symmetric strength and tone of shoulders, elbows, wrist, hip, and ankles bilaterally.  Left knee replacement does have near full range of motion left.  Trace swelling still noted.  Still some warmness to touch.  Moderately tender to palpation.   Impression and Recommendations:     This case required medical decision making of moderate complexity.      Note: This dictation was prepared with Dragon dictation along with smaller phrase technology. Any transcriptional errors that result from this process are unintentional.

## 2017-12-15 DIAGNOSIS — Z96652 Presence of left artificial knee joint: Secondary | ICD-10-CM | POA: Diagnosis not present

## 2017-12-15 DIAGNOSIS — M25562 Pain in left knee: Secondary | ICD-10-CM | POA: Diagnosis not present

## 2017-12-15 DIAGNOSIS — M25662 Stiffness of left knee, not elsewhere classified: Secondary | ICD-10-CM | POA: Diagnosis not present

## 2017-12-19 ENCOUNTER — Ambulatory Visit (INDEPENDENT_AMBULATORY_CARE_PROVIDER_SITE_OTHER): Payer: 59 | Admitting: Cardiology

## 2017-12-19 ENCOUNTER — Encounter: Payer: Self-pay | Admitting: Cardiology

## 2017-12-19 VITALS — BP 120/78 | HR 79 | Ht 72.0 in | Wt 191.0 lb

## 2017-12-19 DIAGNOSIS — R0602 Shortness of breath: Secondary | ICD-10-CM | POA: Diagnosis not present

## 2017-12-19 DIAGNOSIS — I493 Ventricular premature depolarization: Secondary | ICD-10-CM

## 2017-12-19 DIAGNOSIS — I10 Essential (primary) hypertension: Secondary | ICD-10-CM

## 2017-12-19 NOTE — Progress Notes (Signed)
Referring-Matthew Smith DO Reason for referral-palpitations, irregular heartbeat  HPI: 62 year old male for evaluation of palpitations and irregular heartbeat at request of Matthew Saas DO.  Patient is typically very active.  He had knee replacement in January.  Prior to that there was no dyspnea, chest pain, palpitations or syncope.   patient had left knee replacement in January.  Since that time he has noticed increased dyspnea on exertion.  A CTA was performed and showed no pulmonary embolus.  He does not have exertional chest pain.  His blood pressure initially was high postoperatively but he had mistakenly not tak his losartan. He has now resumed and is also on amlodipine and his blood pressure is controlled.  He occasionally has some lightheadedness with standing but no syncope.  He was seen by primary care recently and noted to have an irregular heartbeat and cardiology asked to evaluate.  Current Outpatient Medications  Medication Sig Dispense Refill  . allopurinol (ZYLOPRIM) 300 MG tablet Take 1 tablet (300 mg total) by mouth daily. 90 tablet 3  . amLODipine (NORVASC) 10 MG tablet Take 1 tablet (10 mg total) by mouth daily. 90 tablet 1  . Artificial Tear Ointment (LUBRICANT EYE OP) Place 1 drop into both eyes daily.    . Ferrous Sulfate (IRON) 325 (65 FE) MG TABS Take 325 mg by mouth daily.     . fexofenadine (ALLEGRA) 180 MG tablet Take 180 mg by mouth daily.    . fish oil-omega-3 fatty acids 1000 MG capsule Take 2 g by mouth daily.     . hydrOXYzine (ATARAX/VISTARIL) 25 MG tablet TAKE 1 TABLET BY MOUTH AT BEDTIME AS NEEDED. 90 tablet 0  . Losartan Potassium-HCTZ (HYZAAR PO) Take 50 mg by mouth daily.    . meloxicam (MOBIC) 15 MG tablet Take 1 tablet (15 mg total) by mouth daily. 90 tablet 1  . Multiple Vitamin (MULTIVITAMIN) tablet Take 1 tablet by mouth daily.      [name] OVER THE COUNTER MEDICATION     . tiZANidine (ZANAFLEX) 2 MG tablet Take 1 tablet (2 mg total) by mouth every 8  (eight) hours as needed for muscle spasms. 50 tablet 0  . traZODone (DESYREL) 50 MG tablet Take 0.5-1 tablets (25-50 mg total) by mouth at bedtime as needed for sleep. 30 tablet 3  . TURMERIC PO Take 1 capsule by mouth daily.    . valACYclovir (VALTREX) 1000 MG tablet TAKE 1 TABLET BY MOUTH EVERY DAY 30 tablet 5  . Vitamin D, Ergocalciferol, (DRISDOL) 50000 units CAPS capsule TAKE 1 CAPSULE (50,000 UNITS TOTAL) BY MOUTH EVERY 7 (SEVEN) DAYS. 12 capsule 0   No current facility-administered medications for this visit.     Allergies  Allergen Reactions  . Asa [Aspirin] Rash  . Demerol Itching     Past Medical History:  Diagnosis Date  . Allergy    RHINITIS  . Anemia   . Arthritis   . Colonic polyp 09/2006   colonoscopy  . GERD (gastroesophageal reflux disease)   . Hypertension     Past Surgical History:  Procedure Laterality Date  . COLONOSCOPY  09/2006  . KNEE ARTHROSCOPY Left   . [place] SURGERY  1990  . TOTAL KNEE ARTHROPLASTY Left 11/06/2017   Procedure: TOTAL KNEE ARTHROPLASTY;  Surgeon: Dorna Leitz, MD;  Location: [facility];  Service: Orthopedics;  Laterality: Left;    Social History   Socioeconomic History  . Marital status: Married    Spouse name: Not on file  .  Number of children: 1  . Years of education: Not on file  . Highest education level: Not on file  Social Needs  . Financial resource strain: Not on file  . Food insecurity - worry: Not on file  . Food insecurity - inability: Not on file  . Transportation needs - medical: Not on file  . Transportation needs - non-medical: Not on file  Occupational History  . Not on file  Tobacco Use  . Smoking status: Current Some Day Smoker    Types: Cigars  . Smokeless tobacco: Never Used  Substance and Sexual Activity  . Alcohol use: Yes    Alcohol/week: 1.5 oz    Types: 3 Standard drinks or equivalent per week    Comment: occ 2-3 beers  . Drug use: No  . Sexual activity: Yes  Other Topics Concern  . Not  on file  Social History Narrative  . Not on file    Family History  Problem Relation Age of Onset  . Stroke Father     ROS: Knee arthralgias but no fevers or chills, productive cough, hemoptysis, dysphasia, odynophagia, melena, hematochezia, dysuria, hematuria, rash, seizure activity, orthopnea, PND, pedal edema, claudication. Remaining systems are negative.  Physical Exam:   Blood pressure 120/78, pulse 79, height 6' (1.829 m), weight 191 lb (86.6 kg).  General:  Well developed/well nourished in NAD Skin warm/dry Patient not depressed No peripheral clubbing Back-normal HEENT-normal/normal eyelids Neck supple/normal carotid upstroke bilaterally; no bruits; no JVD; no thyromegaly chest - CTA/ normal expansion CV - RRR/normal S1 and S2; 2/6 systolic ejection murmur.  No rubs or gallops;  PMI nondisplaced;  Abdomen -NT/ND, no HSM, no mass, + bowel sounds, no bruit 2+ femoral pulses, no bruits Ext-no edema, chords, 2+ DP Neuro-grossly nonfocal  ECG - NSR, PVC; personally reviewed  A/P  1 PVCs-patient noted to have an irregular heartbeat previously but is not having palpitations.  PVC noted on electrocardiogram.  Potassium in January normal.  No history of syncope.  I will arrange an echocardiogram to rule out LV dysfunction.  If normal no further therapy indicated.  We can consider adding a beta-blocker in the future if needed.  2 dyspnea-recent CTA negative.  Possibly related to deconditioning postoperatively.  Echocardiogram to assess LV function.  3 hypertension-blood pressure is controlled.  Continue present medications.  Matthew Ruths, MD

## 2017-12-19 NOTE — Patient Instructions (Signed)
Medication Instructions:   NO CHANGE  Testing/Procedures:  Your physician has requested that you have an echocardiogram. Echocardiography is a painless test that uses sound waves to create images of your heart. It provides your doctor with information about the size and shape of your heart and how well your heart's chambers and valves are working. This procedure takes approximately one hour. There are no restrictions for this procedure.    Follow-Up:  Your physician wants you to follow-up in: 6 MONTHS WITH DR CRENSHAW You will receive a reminder letter in the mail two months in advance. If you don't receive a letter, please call our office to schedule the follow-up appointment.      

## 2017-12-26 ENCOUNTER — Ambulatory Visit (HOSPITAL_COMMUNITY): Payer: 59 | Attending: Cardiovascular Disease

## 2017-12-26 ENCOUNTER — Other Ambulatory Visit: Payer: Self-pay

## 2017-12-26 DIAGNOSIS — M25562 Pain in left knee: Secondary | ICD-10-CM | POA: Diagnosis not present

## 2017-12-26 DIAGNOSIS — M25662 Stiffness of left knee, not elsewhere classified: Secondary | ICD-10-CM | POA: Diagnosis not present

## 2017-12-26 DIAGNOSIS — I10 Essential (primary) hypertension: Secondary | ICD-10-CM | POA: Insufficient documentation

## 2017-12-26 DIAGNOSIS — R0602 Shortness of breath: Secondary | ICD-10-CM

## 2017-12-26 DIAGNOSIS — Z96652 Presence of left artificial knee joint: Secondary | ICD-10-CM | POA: Diagnosis not present

## 2018-01-02 DIAGNOSIS — M25662 Stiffness of left knee, not elsewhere classified: Secondary | ICD-10-CM | POA: Diagnosis not present

## 2018-01-02 DIAGNOSIS — Z96652 Presence of left artificial knee joint: Secondary | ICD-10-CM | POA: Diagnosis not present

## 2018-01-02 DIAGNOSIS — M25562 Pain in left knee: Secondary | ICD-10-CM | POA: Diagnosis not present

## 2018-01-04 DIAGNOSIS — Z96652 Presence of left artificial knee joint: Secondary | ICD-10-CM | POA: Diagnosis not present

## 2018-01-04 DIAGNOSIS — M25562 Pain in left knee: Secondary | ICD-10-CM | POA: Diagnosis not present

## 2018-01-04 DIAGNOSIS — M25662 Stiffness of left knee, not elsewhere classified: Secondary | ICD-10-CM | POA: Diagnosis not present

## 2018-01-08 ENCOUNTER — Telehealth: Payer: Self-pay | Admitting: Cardiology

## 2018-01-08 NOTE — Telephone Encounter (Signed)
New message     Patient called to get echo results , its been 2 weeks and they have not heard back from anyone

## 2018-01-08 NOTE — Telephone Encounter (Signed)
Spoke to patient. Echo result given . Patient states he would like to discuss symptoms again. blood pressure still elevated, as well as  heart rate.  Appointment schedule with extender 01/11/18.patient verbalized understanding.

## 2018-01-11 ENCOUNTER — Encounter: Payer: Self-pay | Admitting: Physician Assistant

## 2018-01-11 ENCOUNTER — Ambulatory Visit (INDEPENDENT_AMBULATORY_CARE_PROVIDER_SITE_OTHER): Payer: 59 | Admitting: Physician Assistant

## 2018-01-11 VITALS — BP 134/90 | HR 71 | Ht 72.5 in | Wt 192.6 lb

## 2018-01-11 DIAGNOSIS — R0602 Shortness of breath: Secondary | ICD-10-CM | POA: Diagnosis not present

## 2018-01-11 DIAGNOSIS — R002 Palpitations: Secondary | ICD-10-CM | POA: Diagnosis not present

## 2018-01-11 DIAGNOSIS — I10 Essential (primary) hypertension: Secondary | ICD-10-CM

## 2018-01-11 DIAGNOSIS — Z79899 Other long term (current) drug therapy: Secondary | ICD-10-CM

## 2018-01-11 LAB — BASIC METABOLIC PANEL
BUN/Creatinine Ratio: 13 (ref 10–24)
BUN: 9 mg/dL (ref 8–27)
CHLORIDE: 99 mmol/L (ref 96–106)
CO2: 24 mmol/L (ref 20–29)
Calcium: 10.2 mg/dL (ref 8.6–10.2)
Creatinine, Ser: 0.69 mg/dL — ABNORMAL LOW (ref 0.76–1.27)
GFR calc Af Amer: 118 mL/min/{1.73_m2} (ref >59)
GFR calc non Af Amer: 102 mL/min/{1.73_m2} (ref >59)
Glucose: 89 mg/dL (ref 65–99)
POTASSIUM: 4 mmol/L (ref 3.5–5.2)
Sodium: 138 mmol/L (ref 134–144)

## 2018-01-11 LAB — CBC
HEMOGLOBIN: 13.1 g/dL (ref 13.0–17.7)
Hematocrit: 37.5 % (ref 37.5–51.0)
MCH: 32.5 pg (ref 26.6–33.0)
MCHC: 34.9 g/dL (ref 31.5–35.7)
MCV: 93 fL (ref 79–97)
Platelets: 366 10*3/uL (ref 150–379)
RBC: 4.03 x10E6/uL — AB (ref 4.14–5.80)
RDW: 13.6 % (ref 12.3–15.4)
WBC: 6 10*3/uL (ref 3.4–10.8)

## 2018-01-11 MED ORDER — METOPROLOL TARTRATE 25 MG PO TABS: 12.5000 mg | ORAL_TABLET | Freq: Two times a day (BID) | ORAL | 3 refills | Status: DC

## 2018-01-11 NOTE — Progress Notes (Signed)
Cardiology Office Note    Date:  01/13/2018   ID:  DEMAREE LIBERTO, DOB 12/13/1955, MRN [id number]  PCP:  Denita Lung, MD  Cardiologist:  Dr. Stanford Breed   Chief Complaint  Patient presents with  . [name]    Noticed increase dyspnea with exertion Denies chest pain   . Tachycardia    History of Present Illness:  ELOISE MULA is a 62 y.o. male with PMH of HTN, GERD and palpitation.  Patient was recently evaluated on 12/19/2017 for palpitation and irregular heart beat.  Of note, he underwent left knee replacement in January.  Since then he started noticing increased dyspnea on exertion.  CTA of chest showed no PE.  He does not have any exertional chest pain.  His blood pressure was initially high postop, however he later realized he did not take his losartan.  He was last seen by Dr. Stanford Breed on 12/19/2017, EKG at the time noted PVCs.  Recent lab work in February 2019 was negative for anemia.  TSH, T3 and free T4 normal.  Echocardiogram performed on 12/26/2017 showed EF 60-65%, grade 1 DD, PA peak pressure 34 mmHg.  Patient presents today for cardiology office visit.  He denies any exertional symptoms such as shortness of breath or chest pain.  His episodic dyspnea seems to occur randomly, and mostly at rest.  He woke up on the night of the March 11, after going to the bathroom he noticed his heart rate was in the 140s, this eventually came down to 120s.  He denies any obvious cardiac awareness or palpitation at the time.  It is unclear to me whether or not he is having irregular rhythm.  I will add very low-dose metoprolol tartrate 12.5 mg twice daily.  He also complained of persistently elevated blood pressure, however his blood pressure today is 134/90.  His symptom is inconsistent with coronary artery disease or PE.  I am in favor of doing a trial of beta-blocker for now and see if his symptoms would improve.  It is unclear to me why his blood pressure would be this persistently  high even 9 weeks after the surgery.  However, overall his blood pressure has been improving since the surgery as his blood pressure after the surgery was in the 150-170s range.  On physical exam, he is euvolemic.  I will obtain CBC and basic metabolic panel to rule out potential anemia or electrolyte imbalance or dehydration.    Past Medical History:  Diagnosis Date  . Allergy    RHINITIS  . Anemia   . Arthritis   . Colonic polyp 09/2006   colonoscopy  . GERD (gastroesophageal reflux disease)   . Hypertension     Past Surgical History:  Procedure Laterality Date  . COLONOSCOPY  09/2006  . KNEE ARTHROSCOPY Left   . [place] SURGERY  1990  . TOTAL KNEE ARTHROPLASTY Left 11/06/2017   Procedure: TOTAL KNEE ARTHROPLASTY;  Surgeon: Dorna Leitz, MD;  Location: [facility];  Service: Orthopedics;  Laterality: Left;    Current Medications: Outpatient Medications Prior to Visit  Medication Sig Dispense Refill  . allopurinol (ZYLOPRIM) 300 MG tablet Take 1 tablet (300 mg total) by mouth daily. 90 tablet 3  . amLODipine (NORVASC) 10 MG tablet Take 1 tablet (10 mg total) by mouth daily. 90 tablet 1  . Artificial Tear Ointment (LUBRICANT EYE OP) Place 1 drop into both eyes daily.    . Ferrous Sulfate (IRON) 325 (65 FE)  MG TABS Take 325 mg by mouth daily.     . fexofenadine (ALLEGRA) 180 MG tablet Take 180 mg by mouth daily.    . fish oil-omega-3 fatty acids 1000 MG capsule Take 2 g by mouth daily.     . hydrOXYzine (ATARAX/VISTARIL) 25 MG tablet TAKE 1 TABLET BY MOUTH AT BEDTIME AS NEEDED. 90 tablet 0  . losartan-hydrochlorothiazide (HYZAAR) 100-25 MG tablet Take 1 tablet by mouth daily.    . meloxicam (MOBIC) 15 MG tablet Take 1 tablet (15 mg total) by mouth daily. 90 tablet 1  . Multiple Vitamin (MULTIVITAMIN) tablet Take 1 tablet by mouth daily.      [name] OVER THE COUNTER MEDICATION     . tiZANidine (ZANAFLEX) 2 MG tablet Take 1 tablet (2 mg total) by mouth every 8 (eight) hours as needed for  muscle spasms. 50 tablet 0  . traMADol (ULTRAM) 50 MG tablet TAKE 1 OR 2 TABLETS EVERY 6 TO 8 HOURS AS NEEDED FOR PAIN  0  . traZODone (DESYREL) 50 MG tablet Take 0.5-1 tablets (25-50 mg total) by mouth at bedtime as needed for sleep. 30 tablet 3  . TURMERIC PO Take 1 capsule by mouth daily.    [name] UNABLE TO FIND Med Name: tartcherry    . valACYclovir (VALTREX) 1000 MG tablet TAKE 1 TABLET BY MOUTH EVERY DAY 30 tablet 5  . Vitamin D, Ergocalciferol, (DRISDOL) 50000 units CAPS capsule TAKE 1 CAPSULE (50,000 UNITS TOTAL) BY MOUTH EVERY 7 (SEVEN) DAYS. 12 capsule 0  . Losartan Potassium-HCTZ (HYZAAR PO) Take 50 mg by mouth daily.     No facility-administered medications prior to visit.      Allergies:   Asa [aspirin] and Demerol   Social History   Socioeconomic History  . Marital status: Married    Spouse name: None  . Number of children: 1  . Years of education: None  . Highest education level: None  Social Needs  . Financial resource strain: None  . Food insecurity - worry: None  . Food insecurity - inability: None  . Transportation needs - medical: None  . Transportation needs - non-medical: None  Occupational History  . None  Tobacco Use  . Smoking status: Current Some Day Smoker    Types: Cigars  . Smokeless tobacco: Never Used  Substance and Sexual Activity  . Alcohol use: Yes    Alcohol/week: 1.5 oz    Types: 3 Standard drinks or equivalent per week    Comment: occ 2-3 beers  . Drug use: No  . Sexual activity: Yes  Other Topics Concern  . None  Social History Narrative  . None     Family History:  The patient's family history includes Stroke in his father.   ROS:   Please see the history of present illness.    ROS All other systems reviewed and are negative.   PHYSICAL EXAM:   VS:  BP 134/90 (BP Location: Left Arm, Patient Position: Sitting, Cuff Size: Normal)   Pulse 71   Ht 6' 0.5" (1.842 m)   Wt 192 lb 9.6 oz (87.4 kg)   SpO2 98%   BMI 25.76 kg/m      GEN: Well nourished, well developed, in no acute distress  HEENT: normal  Neck: no JVD, carotid bruits, or masses Cardiac: RRR; no murmurs, rubs, or gallops,no edema  Respiratory:  clear to auscultation bilaterally, normal work of breathing GI: soft, nontender, nondistended, + BS MS: no deformity or atrophy  Skin:  warm and dry, no rash Neuro:  Alert and Oriented x 3, Strength and sensation are intact Psych: euthymic mood, full affect  Wt Readings from Last 3 Encounters:  01/11/18 192 lb 9.6 oz (87.4 kg)  12/19/17 191 lb (86.6 kg)  11/17/17 199 lb (90.3 kg)      Studies/Labs Reviewed:   EKG:  EKG is not ordered today.    Recent Labs: 11/16/2017: ALT 50 12/14/2017: TSH 1.62 01/11/2018: BUN 9; Creatinine, Ser 0.69; Hemoglobin 13.1; Platelets 366; Potassium 4.0; Sodium 138   Lipid Panel    Component Value Date/Time   CHOL 208 (H) 11/02/2017 1442   TRIG 96 11/02/2017 1442   HDL 71 11/02/2017 1442   CHOLHDL 2.9 11/02/2017 1442   VLDL 13 07/28/2015 0001   LDLCALC 117 (H) 11/02/2017 1442    Additional studies/ records that were reviewed today include:   CTA of chest 11/16/2017 IMPRESSION: No definite evidence of pulmonary embolus. No abnormality seen in the chest.   Echo 12/26/2017 LV EF: 60% -   65%  Study Conclusions  - Left ventricle: The cavity size was normal. There was mild   concentric hypertrophy. Systolic function was normal. The   estimated ejection fraction was in the range of 60% to 65%. Wall   motion was normal; there were no regional wall motion   abnormalities. Doppler parameters are consistent with abnormal   left ventricular relaxation (grade 1 diastolic dysfunction). - Aortic valve: Transvalvular velocity was within the normal range.   There was no stenosis. There was no regurgitation. - Mitral valve: There was no regurgitation. - Left atrium: The atrium was mildly dilated. - Right ventricle: The cavity size was normal. Wall thickness was    normal. Systolic function was normal. - Atrial septum: No defect or patent foramen ovale was identified   by color flow Doppler. - Tricuspid valve: There was trivial regurgitation. - Pulmonary arteries: Systolic pressure was within the normal   range. PA peak pressure: 34 mm Hg (S).   ASSESSMENT:    1. [name]   2. Medication management   3. Palpitation   4. Essential hypertension      PLAN:  In order of problems listed above:  1. Dyspnea: He was noticed his heart rate did jump up to 140 with minimal exertion.  I recommended low-dose beta-blocker as a trial.  He will need to monitor blood pressure closely and keep a blood pressure diary.  Suspicion for PE is low.  Suspicion for coronary artery disease somewhat low as well because patient's dyspnea on exertion and seems to be episodic and does not necessarily correlate with the degree of exertion.  I will hold off on heart monitor at this time.  Obtain CBC and basic metabolic panel.   2. Hypertension: Blood pressure stable.  Add 12.5 mg twice daily of metoprolol tartrate    Medication Adjustments/Labs and Tests Ordered: Current medicines are reviewed at length with the patient today.  Concerns regarding medicines are outlined above.  Medication changes, Labs and Tests ordered today are listed in the Patient Instructions below. Patient Instructions  Almyra Deforest, PA has recommended making the following medication changes: 1. START Metoprolol 12.5 mg - take 0.5 tablet (12.5 mg total) by mouth twice daily  Your physician has requested that you regularly monitor and record your blood pressure readings at home. Please use the same machine two times per day to check your readings and record them to bring to your follow-up visit.  Isaac Laud  recommends that you schedule a follow-up appointment in 2-3 months with Dr Stanford Breed.  If you need a refill on your cardiac medications before your next appointment, please call your pharmacy.        Hilbert Corrigan, [place]  01/13/2018 10:15 AM    [address], [place], [place]  [address] Phone: [phone]; Fax: [phone]

## 2018-01-11 NOTE — Patient Instructions (Signed)
Matthew [name], Matthew [name] has recommended making the following medication changes: 1. START Metoprolol 12.5 mg - take 0.5 tablet (12.5 mg total) by mouth twice daily  Your physician has requested that you regularly monitor and record your blood pressure readings at home. Please use the same machine two times per day to check your readings and record them to bring to your follow-up visit.  Matthew [name] recommends that you schedule a follow-up appointment in 2-3 months with Dr Stanford Breed.  If you need a refill on your cardiac medications before your next appointment, please call your pharmacy.

## 2018-01-13 ENCOUNTER — Encounter: Payer: Self-pay | Admitting: Physician Assistant

## 2018-01-17 ENCOUNTER — Ambulatory Visit (AMBULATORY_SURGERY_CENTER): Payer: Self-pay

## 2018-01-17 ENCOUNTER — Encounter: Payer: Self-pay | Admitting: Internal Medicine

## 2018-01-17 VITALS — Ht 72.05 in | Wt 195.2 lb

## 2018-01-17 DIAGNOSIS — Z86010 Personal history of colonic polyps: Secondary | ICD-10-CM

## 2018-01-17 MED ORDER — NA SULFATE-K SULFATE-MG SULF 17.5-3.13-1.6 GM/177ML PO SOLN: 1.0000 | Freq: Once | ORAL | 0 refills | Status: AC

## 2018-01-17 NOTE — Progress Notes (Signed)
Per pt, no allergies to soy or egg products.Pt not taking any weight loss meds or using  O2 at home.  Pt refused emmi video. 

## 2018-01-18 ENCOUNTER — Encounter: Payer: Self-pay | Admitting: Physician Assistant

## 2018-01-21 ENCOUNTER — Encounter: Payer: Self-pay | Admitting: Family Medicine

## 2018-01-25 DIAGNOSIS — M25562 Pain in left knee: Secondary | ICD-10-CM | POA: Diagnosis not present

## 2018-01-30 ENCOUNTER — Telehealth: Payer: Self-pay | Admitting: Internal Medicine

## 2018-01-30 NOTE — Telephone Encounter (Signed)
Returned patient's call and he states that he did not stop his iron as instructed.  He did take it today as well.  He states he will not take it tomorrow.  I advised him that I will make note so the doctor and staff is aware.  All concerns were addressed.  B.Benitez, CMA

## 2018-01-31 ENCOUNTER — Other Ambulatory Visit: Payer: 59

## 2018-01-31 ENCOUNTER — Other Ambulatory Visit: Payer: Self-pay

## 2018-01-31 ENCOUNTER — Ambulatory Visit (AMBULATORY_SURGERY_CENTER): Payer: 59 | Admitting: Internal Medicine

## 2018-01-31 ENCOUNTER — Encounter: Payer: Self-pay | Admitting: Internal Medicine

## 2018-01-31 VITALS — BP 136/88 | HR 60 | Temp 97.1°F | Resp 20 | Ht 72.5 in | Wt 195.0 lb

## 2018-01-31 DIAGNOSIS — Z86010 Personal history of colonic polyps: Secondary | ICD-10-CM

## 2018-01-31 DIAGNOSIS — Z1211 Encounter for screening for malignant neoplasm of colon: Secondary | ICD-10-CM | POA: Diagnosis not present

## 2018-01-31 MED ORDER — SODIUM CHLORIDE 0.9 % IV SOLN
4.0000 mg | Freq: Once | INTRAVENOUS | Status: AC
  Administered 2018-01-31: 4 mg via INTRAVENOUS

## 2018-01-31 MED ORDER — SODIUM CHLORIDE 0.9 % IV SOLN: 500.0000 mL | Freq: Once | INTRAVENOUS | Status: DC

## 2018-01-31 NOTE — Progress Notes (Signed)
12:43 Pt sitting in fowlers position.  He had no complaints of nausea.  He said he was ready to go home.  I suggested he start with cracker or toast.  If he tolerates them without nausea or vomiting then advance to eggs.  maw

## 2018-01-31 NOTE — Progress Notes (Signed)
A and O x3. Report to RN. Tolerated MAC anesthesia well.

## 2018-01-31 NOTE — Patient Instructions (Signed)
YOU HAD AN ENDOSCOPIC PROCEDURE TODAY AT [facility]:   Refer to the procedure report that was given to you for any specific questions about what was found during the examination.  If the procedure report does not answer your questions, please call your gastroenterologist to clarify.  If you requested that your care partner not be given the details of your procedure findings, then the procedure report has been included in a sealed envelope for you to review at your convenience later.  YOU SHOULD EXPECT: Some feelings of bloating in the abdomen. Passage of more gas than usual.  Walking can help get rid of the air that was put into your GI tract during the procedure and reduce the bloating. If you had a lower endoscopy (such as a colonoscopy or flexible sigmoidoscopy) you may notice spotting of blood in your stool or on the toilet paper. If you underwent a bowel prep for your procedure, you may not have a normal bowel movement for a few days.  Please Note:  You might notice some irritation and congestion in your nose or some drainage.  This is from the oxygen used during your procedure.  There is no need for concern and it should clear up in a day or so.  SYMPTOMS TO REPORT IMMEDIATELY:   Following lower endoscopy (colonoscopy or flexible sigmoidoscopy):  Excessive amounts of blood in the stool  Significant tenderness or worsening of abdominal pains  Swelling of the abdomen that is new, acute  Fever of 100F or higher   For urgent or emergent issues, a gastroenterologist can be reached at any hour by calling [phone].   DIET:  We do recommend a small meal at first, but then you may proceed to your regular diet.  Drink plenty of fluids but you should avoid alcoholic beverages for 24 hours.  ACTIVITY:  You should plan to take it easy for the rest of today and you should NOT DRIVE or use heavy machinery until tomorrow (because of the sedation medicines used during the test).     FOLLOW UP: Our staff will call the number listed on your records the next business day following your procedure to check on you and address any questions or concerns that you may have regarding the information given to you following your procedure. If we do not reach you, we will leave a message.  However, if you are feeling well and you are not experiencing any problems, there is no need to return our call.  We will assume that you have returned to your regular daily activities without incident.  If any biopsies were taken you will be contacted by phone or by letter within the next 1-3 weeks.  Please call us at [phone] if you have not heard about the biopsies in 3 weeks.    SIGNATURES/CONFIDENTIALITY: You and/or your care partner have signed paperwork which will be entered into your electronic medical record.  These signatures attest to the fact that that the information above on your After Visit Summary has been reviewed and is understood.  Full responsibility of the confidentiality of this discharge information lies with you and/or your care-partner.    Handouts were given to your care partner on diverticulosis and hemorrhoids. You may resume your current medications today. Repeat colonoscopy in 10 years for surveillance. Please call if any questions or concerns.

## 2018-01-31 NOTE — Progress Notes (Signed)
12:19 pt sat up and stated, "I am going to be sick".  First pt had dry heaves, then vomited up clear, frothy liquid several times.  Reported to Dr. Henrene Pastor, ok to given IV zofran 4 mg - diluted 2 mg in 10 ml saline and pushed slowly IV, then 2 mg in 10 ml saline and pushed IV at 12:26.  maw

## 2018-01-31 NOTE — Op Note (Signed)
[place] Patient Name: Matthew [name] Procedure Date: 01/31/2018 11:33 AM MRN: [id number] Endoscopist: [name] , MD Age: 62 Referring MD:  Date of Birth: 1956-05-05 Gender: Male Account #: [id number] Procedure:                Colonoscopy Indications:              High risk colon cancer surveillance: Personal                            history of non-advanced adenoma. Index exam 2007                            with small tubular adenoma. Exam 2012 negative for                            neoplasia Medicines:                Monitored Anesthesia Care Procedure:                Pre-Anesthesia Assessment:                           - Prior to the procedure, a History and Physical                            was performed, and patient medications and                            allergies were reviewed. The patient's tolerance of                            previous anesthesia was also reviewed. The risks                            and benefits of the procedure and the sedation                            options and risks were discussed with the patient.                            All questions were answered, and informed consent                            was obtained. Prior Anticoagulants: The patient has                            taken no previous anticoagulant or antiplatelet                            agents. ASA Grade Assessment: II - A patient with                            mild systemic disease. After reviewing the risks  and benefits, the patient was deemed in                            satisfactory condition to undergo the procedure.                           After obtaining informed consent, the colonoscope                            was passed under direct vision. Throughout the                            procedure, the patient's blood pressure, pulse, and                            oxygen saturations were monitored continuously. The                        Colonoscope was introduced through the anus and                            advanced to the the cecum, identified by                            appendiceal orifice and ileocecal valve. The                            ileocecal valve, appendiceal orifice, and rectum                            were photographed. The quality of the bowel                            preparation was good. The colonoscopy was performed                            without difficulty. The patient tolerated the                            procedure well. The bowel preparation used was                            SUPREP. Scope In: 11:37:59 AM Scope Out: 11:55:30 AM Scope Withdrawal Time: 0 hours 13 minutes 43 seconds  Total Procedure Duration: 0 hours 17 minutes 31 seconds  Findings:                 Multiple small and large-mouthed diverticula were                            found in the left colon and right colon.                           Internal hemorrhoids were found during  retroflexion. The hemorrhoids were small.                           The exam was otherwise without abnormality on                            direct and retroflexion views. Complications:            No immediate complications. Estimated blood loss:                            None. Estimated Blood Loss:     Estimated blood loss: none. Impression:               - Diverticulosis in the left colon and in the right                            colon.                           - Internal hemorrhoids.                           - The examination was otherwise normal on direct                            and retroflexion views.                           - No specimens collected. Recommendation:           - Repeat colonoscopy in 10 years for surveillance.                           - Patient has a contact number available for                            emergencies. The signs and symptoms of potential                             delayed complications were discussed with the                            patient. Return to normal activities tomorrow.                            Written discharge instructions were provided to the                            patient.                           - Resume previous diet.                           - Continue present medications. [name], MD 01/31/2018 12:00:15 PM This report has been signed electronically.

## 2018-01-31 NOTE — Progress Notes (Signed)
Pt's states no medical or surgical changes since previsit or office visit. 

## 2018-02-01 ENCOUNTER — Telehealth: Payer: Self-pay

## 2018-02-01 NOTE — Telephone Encounter (Signed)
  Follow up Call-  Call back number 01/31/2018  Post procedure Call Back phone  # [phone]  Permission to leave phone message Yes  Some recent data might be hidden     Patient questions:  Do you have a fever, pain , or abdominal swelling? No. Pain Score  0 *  Have you tolerated food without any problems? Yes.    Have you been able to return to your normal activities? Yes.    Do you have any questions about your discharge instructions: Diet   No. Medications  No. Follow up visit  No.  Do you have questions or concerns about your Care? No.  Actions: * If pain score is 4 or above: No action needed, pain <4.  No problems noted per pt. maw

## 2018-02-04 ENCOUNTER — Encounter: Payer: Self-pay | Admitting: Family Medicine

## 2018-02-23 ENCOUNTER — Other Ambulatory Visit (INDEPENDENT_AMBULATORY_CARE_PROVIDER_SITE_OTHER): Payer: 59

## 2018-02-23 DIAGNOSIS — Z23 Encounter for immunization: Secondary | ICD-10-CM | POA: Diagnosis not present

## 2018-02-24 ENCOUNTER — Other Ambulatory Visit: Payer: Self-pay | Admitting: Family Medicine

## 2018-02-26 NOTE — Telephone Encounter (Signed)
Refill done.  

## 2018-03-29 DIAGNOSIS — M25562 Pain in left knee: Secondary | ICD-10-CM | POA: Diagnosis not present

## 2018-04-19 NOTE — Progress Notes (Signed)
HPI: FU PVCs and dyspnea.  Patient had left knee replacement in January 2019.  CTA was performed related to dyspnea and showed no pulmonary embolus.    Echocardiogram February 2019 showed normal LV function, mild diastolic dysfunction and mild left atrial enlargement.  Patient also noted to have PVCs on ECG.  Patient seen March 2019 with complaint of episode of tachycardia and beta-blocker added.  Since he was last seen has occasional episodes of dyspnea not related to exertion.  He exercises routinely with no symptoms of dyspnea or chest pain.  No palpitations or syncope.  Current Outpatient Medications  Medication Sig Dispense Refill  . allopurinol (ZYLOPRIM) 300 MG tablet Take 1 tablet (300 mg total) by mouth daily. 90 tablet 3  . amLODipine (NORVASC) 10 MG tablet Take 1 tablet (10 mg total) by mouth daily. (Patient taking differently: Take 10 mg by mouth at bedtime. ) 90 tablet 1  . Artificial Tear Ointment (LUBRICANT EYE OP) Place 1 drop into both eyes daily.    . cholecalciferol (VITAMIN D) 1000 units tablet Take 1,000 Units by mouth daily.    . Ferrous Sulfate (IRON) 325 (65 FE) MG TABS Take 325 mg by mouth daily.     . fexofenadine (ALLEGRA) 180 MG tablet Take 180 mg by mouth at bedtime.     . fish oil-omega-3 fatty acids 1000 MG capsule Take 2 g by mouth daily.     [name] losartan-hydrochlorothiazide (HYZAAR) 100-25 MG tablet Take 1 tablet by mouth daily.    . metoprolol tartrate (LOPRESSOR) 25 MG tablet Take 0.5 tablets (12.5 mg total) by mouth 2 (two) times daily. 90 tablet 3  . Multiple Vitamin (MULTIVITAMIN) tablet Take 1 tablet by mouth daily.      [name] OVER THE COUNTER MEDICATION Take 3 pills at bedtime    . traZODone (DESYREL) 50 MG tablet Take 0.5-1 tablets (25-50 mg total) by mouth at bedtime as needed for sleep. 30 tablet 3  . TURMERIC PO Take 1 capsule by mouth daily.    [name] UNABLE TO FIND Med Name: tartcherry    . valACYclovir (VALTREX) 1000 MG tablet TAKE 1 TABLET BY MOUTH  EVERY DAY 30 tablet 5  . Vitamin D, Ergocalciferol, (DRISDOL) 50000 units CAPS capsule TAKE 1 CAPSULE (50,000 UNITS TOTAL) BY MOUTH EVERY 7 (SEVEN) DAYS. 12 capsule 0  . traMADol (ULTRAM) 50 MG tablet TAKE 1 OR 2 TABLETS EVERY 6 TO 8 HOURS AS NEEDED FOR PAIN  0   Current Facility-Administered Medications  Medication Dose Route Frequency Provider Last Rate Last Dose  . 0.9 %  sodium chloride infusion  500 mL Intravenous Once Irene Shipper, MD         Past Medical History:  Diagnosis Date  . Allergy    RHINITIS  . Anemia   . Arthritis   . Colonic polyp 09/2006   colonoscopy  . GERD (gastroesophageal reflux disease)   . Gout   . Hypertension   . SOB (shortness of breath)    per pt, may be coming from new medication    Past Surgical History:  Procedure Laterality Date  . COLONOSCOPY  09/2006  . KNEE ARTHROSCOPY Left 2011  . [place] SURGERY  1990  . TOTAL KNEE ARTHROPLASTY Left 11/06/2017   Procedure: TOTAL KNEE ARTHROPLASTY;  Surgeon: Dorna Leitz, MD;  Location: [facility];  Service: Orthopedics;  Laterality: Left;    Social History   Socioeconomic History  . Marital status: Married  Spouse name: Not on file  . Number of children: 1  . Years of education: Not on file  . Highest education level: Not on file  Occupational History  . Not on file  Social Needs  . Financial resource strain: Not on file  . Food insecurity:    Worry: Not on file    Inability: Not on file  . Transportation needs:    Medical: Not on file    Non-medical: Not on file  Tobacco Use  . Smoking status: Current Some Day Smoker    Types: Cigars  . Smokeless tobacco: Never Used  . Tobacco comment: occasional cigar  Substance and Sexual Activity  . Alcohol use: Yes    Alcohol/week: 1.8 oz    Types: 3 Standard drinks or equivalent per week    Comment: occ 2-3 beers  . Drug use: No  . Sexual activity: Yes  Lifestyle  . Physical activity:    Days per week: Not on file    Minutes per session:  Not on file  . Stress: Not on file  Relationships  . Social connections:    Talks on phone: Not on file    Gets together: Not on file    Attends religious service: Not on file    Active member of club or organization: Not on file    Attends meetings of clubs or organizations: Not on file    Relationship status: Not on file  . Intimate partner violence:    Fear of current or ex partner: Not on file    Emotionally abused: Not on file    Physically abused: Not on file    Forced sexual activity: Not on file  Other Topics Concern  . Not on file  Social History Narrative  . Not on file    Family History  Problem Relation Age of Onset  . Stroke Father   . Colon cancer Neg Hx   . Rectal cancer Neg Hx     ROS: no fevers or chills, productive cough, hemoptysis, dysphasia, odynophagia, melena, hematochezia, dysuria, hematuria, rash, seizure activity, orthopnea, PND, pedal edema, claudication. Remaining systems are negative.  Physical Exam: Well-developed well-nourished in no acute distress.  Skin is warm and dry.  HEENT is normal.  Neck is supple.  Chest is clear to auscultation with normal expansion.  Cardiovascular exam is regular rate and rhythm.  Abdominal exam nontender or distended. No masses palpated. Extremities show no edema. neuro grossly intact  A/P  1 PVCs-patient previously noted to have PVCs.  Echocardiogram showed normal LV function.  He is now on a beta-blocker and he has no palpitations.  Continue medical therapy.  2 hypertension-pressure is controlled.  Continue present medications.    3 dyspnea-previous CTA showed no pulmonary embolus.  Symptoms are somewhat improved.  No plans for further evaluation at this point.  Kirk Ruths, MD

## 2018-04-30 ENCOUNTER — Ambulatory Visit (INDEPENDENT_AMBULATORY_CARE_PROVIDER_SITE_OTHER): Payer: 59 | Admitting: Cardiology

## 2018-04-30 ENCOUNTER — Encounter: Payer: Self-pay | Admitting: Cardiology

## 2018-04-30 VITALS — BP 120/62 | HR 70 | Ht 73.0 in | Wt 199.2 lb

## 2018-04-30 DIAGNOSIS — I493 Ventricular premature depolarization: Secondary | ICD-10-CM

## 2018-04-30 DIAGNOSIS — R0602 Shortness of breath: Secondary | ICD-10-CM | POA: Diagnosis not present

## 2018-04-30 DIAGNOSIS — I10 Essential (primary) hypertension: Secondary | ICD-10-CM | POA: Diagnosis not present

## 2018-04-30 NOTE — Patient Instructions (Signed)
Your physician wants you to follow-up in: ONE YEAR WITH DR CRENSHAW You will receive a reminder letter in the mail two months in advance. If you don't receive a letter, please call our office to schedule the follow-up appointment.   If you need a refill on your cardiac medications before your next appointment, please call your pharmacy.  

## 2018-05-28 NOTE — Progress Notes (Signed)
[name] [address] [place], [place] [address] Phone: [phone] Subjective:      CC: Toe pain, left knee follow-up  [id number]  Matthew [name] is a 62 y.o. male coming in with complaint of toe pain.  Patient dropped a drawer on his large toe on the left foot 2 weeks ago.  Had significant bruising.  States that it feels like he is going to lose the toe.  Still gives him some discomfort from time to time.  Wants to know what he needs to do about it.  Is able to walk.  Patient is still complaining of his left knee.  Did have surgery.  Still has some swelling.  Has an audible popping sensation that is irritating.  Denies any instability.  Has finished physical therapy for it at this time.  Able to do most daily activities but is unable to increase activity at this time     Past Medical History:  Diagnosis Date  . Allergy    RHINITIS  . Anemia   . Arthritis   . Colonic polyp 09/2006   colonoscopy  . GERD (gastroesophageal reflux disease)   . Gout   . Hypertension   . SOB (shortness of breath)    per pt, may be coming from new medication   Past Surgical History:  Procedure Laterality Date  . COLONOSCOPY  09/2006  . KNEE ARTHROSCOPY Left 2011  . [place] SURGERY  1990  . TOTAL KNEE ARTHROPLASTY Left 11/06/2017   Procedure: TOTAL KNEE ARTHROPLASTY;  Surgeon: Dorna Leitz, MD;  Location: [facility];  Service: Orthopedics;  Laterality: Left;   Social History   Socioeconomic History  . Marital status: Married    Spouse name: Not on file  . Number of children: 1  . Years of education: Not on file  . Highest education level: Not on file  Occupational History  . Not on file  Social Needs  . Financial resource strain: Not on file  . Food insecurity:    Worry: Not on file    Inability: Not on file  . Transportation needs:    Medical: Not on file    Non-medical: Not on file  Tobacco Use  . Smoking status: Current Some Day Smoker   Types: Cigars  . Smokeless tobacco: Never Used  . Tobacco comment: occasional cigar  Substance and Sexual Activity  . Alcohol use: Yes    Alcohol/week: 1.8 oz    Types: 3 Standard drinks or equivalent per week    Comment: occ 2-3 beers  . Drug use: No  . Sexual activity: Yes  Lifestyle  . Physical activity:    Days per week: Not on file    Minutes per session: Not on file  . Stress: Not on file  Relationships  . Social connections:    Talks on phone: Not on file    Gets together: Not on file    Attends religious service: Not on file    Active member of club or organization: Not on file    Attends meetings of clubs or organizations: Not on file    Relationship status: Not on file  Other Topics Concern  . Not on file  Social History Narrative  . Not on file   Allergies  Allergen Reactions  . Asa [Aspirin] Rash    High dosage ASA causes break out  . Demerol Itching   Family History  Problem Relation Age of Onset  . Stroke  Father   . Colon cancer Neg Hx   . Rectal cancer Neg Hx      Past medical history, social, surgical and family history all reviewed in electronic medical record.  No pertanent information unless stated regarding to the chief complaint.   Review of Systems:Review of systems updated and as accurate as of 05/29/18  No headache, visual changes, nausea, vomiting, diarrhea, constipation, dizziness, abdominal pain, skin rash, fevers, chills, night sweats, weight loss, swollen lymph nodes, body aches, joint swelling, muscle aches, chest pain, shortness of breath, mood changes.   Objective  Blood pressure 110/80, pulse 67, height 6\' 1"  (1.854 m), weight 200 lb (90.7 kg), SpO2 96 %. Systems examined below as of 05/29/18   General: No apparent distress alert and oriented x3 mood and affect normal, dressed appropriately.  HEENT: Pupils equal, extraocular movements intact  Respiratory: Patient's speak in full sentences and does not appear short of breath    Cardiovascular: No lower extremity edema, non tender, no erythema  Skin: Warm dry intact with no signs of infection or rash on extremities or on axial skeleton.  Abdomen: Soft nontender  Neuro: Cranial nerves II through XII are intact, neurovascularly intact in all extremities with 2+ DTRs and 2+ pulses.  Lymph: No lymphadenopathy of posterior or anterior cervical chain or axillae bilaterally.  Gait mild antalgic MSK:  Non tender with full range of motion and good stability and symmetric strength and tone of shoulders, elbows, wrist, hip, and ankles bilaterally.  Left knee does show replacement.  Does have trace swelling just proximal to the knee itself under patient's most proximal aspect of the scar.  Trace effusion of the knee itself.  Lacks last 2 degrees of extension and has 95 degrees of flexion.  Good stability noted of the knee replacement.  Patient's first toe on the left does show a subungual hematoma.  Seems to be dried blood at this time.  Patient does have some mild loosening of the nail on the medial aspect.  Does have some white discoloration of the nail bed.  Patient though the distal toe does have good capillary refill.  Neurovascularly intact.   Impression and Recommendations:     This case required medical decision making of moderate complexity.      Note: This dictation was prepared with Dragon dictation along with smaller phrase technology. Any transcriptional errors that result from this process are unintentional.

## 2018-05-29 ENCOUNTER — Encounter: Payer: Self-pay | Admitting: Family Medicine

## 2018-05-29 ENCOUNTER — Ambulatory Visit (INDEPENDENT_AMBULATORY_CARE_PROVIDER_SITE_OTHER): Payer: 59 | Admitting: Family Medicine

## 2018-05-29 DIAGNOSIS — M25462 Effusion, left knee: Secondary | ICD-10-CM

## 2018-05-29 DIAGNOSIS — S90229A Contusion of unspecified lesser toe(s) with damage to nail, initial encounter: Secondary | ICD-10-CM

## 2018-05-29 DIAGNOSIS — M25562 Pain in left knee: Secondary | ICD-10-CM | POA: Diagnosis not present

## 2018-05-29 HISTORY — DX: Contusion of unspecified lesser toe(s) with damage to nail, initial encounter: S90.229A

## 2018-05-29 NOTE — Assessment & Plan Note (Signed)
Swelling after replacement, seems to be under scar tissue discussed drainage but will discuss with ortho surgeon.

## 2018-05-29 NOTE — Assessment & Plan Note (Signed)
Attempted removal.  He did not get it done.  We discussed icing regimen and home exercises.  We discussed that likely will lose the nail.  Nothing to do for it at this time.  Discussed different things to try to limit exposure.  Discussed warm compresses to see if this would be beneficial.  Follow-up again in 6 weeks

## 2018-05-31 ENCOUNTER — Other Ambulatory Visit: Payer: Self-pay | Admitting: *Deleted

## 2018-05-31 DIAGNOSIS — M1712 Unilateral primary osteoarthritis, left knee: Secondary | ICD-10-CM

## 2018-05-31 NOTE — Progress Notes (Signed)
That would be fine.  It would be somewhat unusual to have fluid there so I certainly would just be comfortable that it was in fact fluid before I was putting a needle in it.  Sometimes will see heaped up tendon is healing from the quad or other scar type tissue.  Let me know how it goes.  I would pain him up multiple times and try to stay out of the joint.

## 2018-06-04 ENCOUNTER — Other Ambulatory Visit: Payer: 59

## 2018-06-04 ENCOUNTER — Ambulatory Visit (INDEPENDENT_AMBULATORY_CARE_PROVIDER_SITE_OTHER): Payer: 59 | Admitting: Family Medicine

## 2018-06-04 ENCOUNTER — Ambulatory Visit: Payer: Self-pay

## 2018-06-04 ENCOUNTER — Encounter: Payer: Self-pay | Admitting: Family Medicine

## 2018-06-04 VITALS — BP 110/88 | HR 60 | Ht 73.0 in

## 2018-06-04 DIAGNOSIS — Z96652 Presence of left artificial knee joint: Secondary | ICD-10-CM | POA: Diagnosis not present

## 2018-06-04 DIAGNOSIS — G8929 Other chronic pain: Secondary | ICD-10-CM | POA: Diagnosis not present

## 2018-06-04 DIAGNOSIS — M25562 Pain in left knee: Secondary | ICD-10-CM

## 2018-06-04 MED ORDER — DOXYCYCLINE HYCLATE 100 MG PO TABS: 100.0000 mg | ORAL_TABLET | Freq: Two times a day (BID) | ORAL | 0 refills | Status: AC

## 2018-06-04 NOTE — Progress Notes (Signed)
[name] [address] [place], [place] [address] Phone: [phone] Subjective:    I'm seeing this patient by the request  of:    CC: Left knee  [id number]  Matthew [name] is a 62 y.o. male coming in with complaint of left knee pain. Complains of grinding and crunching and is having swelling over suprerior aspect of patella.  Patient has had difficulty with this since patient's surgery.  Feels like he just does not have the full range of motion.  The grinding sensation seems to give some irritability.  Denies though any instability.  I have discussed with his orthopedic surgeon about the possibility of aspiration which patient is here for to see if we can figure out why he continues to have this swelling.     Past Medical History:  Diagnosis Date  . Allergy    RHINITIS  . Anemia   . Arthritis   . Colonic polyp 09/2006   colonoscopy  . GERD (gastroesophageal reflux disease)   . Gout   . Hypertension   . SOB (shortness of breath)    per pt, may be coming from new medication   Past Surgical History:  Procedure Laterality Date  . COLONOSCOPY  09/2006  . KNEE ARTHROSCOPY Left 2011  . [place] SURGERY  1990  . TOTAL KNEE ARTHROPLASTY Left 11/06/2017   Procedure: TOTAL KNEE ARTHROPLASTY;  Surgeon: Dorna Leitz, MD;  Location: [facility];  Service: Orthopedics;  Laterality: Left;   Social History   Socioeconomic History  . Marital status: Married    Spouse name: Not on file  . Number of children: 1  . Years of education: Not on file  . Highest education level: Not on file  Occupational History  . Not on file  Social Needs  . Financial resource strain: Not on file  . Food insecurity:    Worry: Not on file    Inability: Not on file  . Transportation needs:    Medical: Not on file    Non-medical: Not on file  Tobacco Use  . Smoking status: Current Some Day Smoker    Types: Cigars  . Smokeless tobacco: Never Used  . Tobacco comment:  occasional cigar  Substance and Sexual Activity  . Alcohol use: Yes    Alcohol/week: 1.8 oz    Types: 3 Standard drinks or equivalent per week    Comment: occ 2-3 beers  . Drug use: No  . Sexual activity: Yes  Lifestyle  . Physical activity:    Days per week: Not on file    Minutes per session: Not on file  . Stress: Not on file  Relationships  . Social connections:    Talks on phone: Not on file    Gets together: Not on file    Attends religious service: Not on file    Active member of club or organization: Not on file    Attends meetings of clubs or organizations: Not on file    Relationship status: Not on file  Other Topics Concern  . Not on file  Social History Narrative  . Not on file   Allergies  Allergen Reactions  . Asa [Aspirin] Rash    High dosage ASA causes break out  . Demerol Itching   Family History  Problem Relation Age of Onset  . Stroke Father   . Colon cancer Neg Hx   . Rectal cancer Neg Hx      Past medical history,  social, surgical and family history all reviewed in electronic medical record.  No pertanent information unless stated regarding to the chief complaint.   Review of Systems:Review of systems updated and as accurate as of 06/04/18  No headache, visual changes, nausea, vomiting, diarrhea, constipation, dizziness, abdominal pain, skin rash, fevers, chills, night sweats, weight loss, swollen lymph nodes, body aches,  muscle aches, chest pain, shortness of breath, mood changes.  Positive joint swelling  Objective  Blood pressure 110/88, pulse 60, height 6\' 1"  (1.854 m), SpO2 94 %. Systems examined below as of 06/04/18   General: No apparent distress alert and oriented x3 mood and affect normal, dressed appropriately.  HEENT: Pupils equal, extraocular movements intact  Respiratory: Patient's speak in full sentences and does not appear short of breath  Cardiovascular: No lower extremity edema, non tender, no erythema  Skin: Warm dry intact  with no signs of infection or rash on extremities or on axial skeleton.  Abdomen: Soft nontender  Neuro: Cranial nerves II through XII are intact, neurovascularly intact in all extremities with 2+ DTRs and 2+ pulses.  Lymph: No lymphadenopathy of posterior or anterior cervical chain or axillae bilaterally.  Gait mild antalgic MSK:  Non tender with full range of motion and good stability and symmetric strength and tone of shoulders, elbows, wrist, hip, and ankles bilaterally.    Left knee still shows that patient does have some mild swelling noted just proximal to the scar formation.  Patient does have grinding that is audible with flexion extension of the knee.  Patient has flexion of 95 degrees of this prosthesis.  Mild warmness of the knee compared to the contralateral side.   Procedure: Real-time Ultrasound Guided Injection of left knee superior Device: GE Logiq Q7 Ultrasound guided injection is preferred based studies that show increased duration, increased effect, greater accuracy, decreased procedural pain, increased response rate, and decreased cost with ultrasound guided versus blind injection.  Verbal informed consent obtained.  Time-out conducted.  Noted no overlying erythema, induration, or other signs of local infection.  Skin prepped in a sterile fashion.  Local anesthesia: Topical Ethyl chloride.  With sterile technique and under real time ultrasound guidance: With a 22-gauge 2 inch needle patient was injected with 4 cc of 0.5% Marcaine and aspirated 30 cc of strawlike color fluid with some mild debris this was from a superior lateral approach.  Completed without difficulty  Pain immediately resolved suggesting accurate placement of the medication.  Advised to call if fevers/chills, erythema, induration, drainage, or persistent bleeding.  Images permanently stored and available for review in the ultrasound unit.  Impression: Technically successful ultrasound guided injection.     Impression and Recommendations:     This case required medical decision making of moderate complexity.      Note: This dictation was prepared with Dragon dictation along with smaller phrase technology. Any transcriptional errors that result from this process are unintentional.

## 2018-06-04 NOTE — Assessment & Plan Note (Addendum)
Superior and proximal to the knee and we did not attempt aspiration.  It does appear that there was potential communication to the joint replacement.  Before we did this we did discuss the possibility of possible infection and patient was willing to take this chance.  Athletic trainer was within the room at the same time.  We discussed significantly about the risk of any type of bleeding or infectious etiology.  We discussed with patient and we did even tell orthopedic surgeon this week and that this was a possibility.  They were both in agreement to attempt aspiration to see how patient does.  He did not feel comfortable using a steroid at this time.  Patient did have improvement in range of motion almost immediately but unfortunately patient continues to have a grinding sensation.  We discussed further work-up would include an MRI but I do not think that surgical intervention would be significantly beneficial with patient able to do most daily activities without any significant trouble.  I do not feel any loosening of any of the prosthesis.  Patient is going to follow-up with me again in 2 weeks.  Antibiotics given just in case.

## 2018-06-04 NOTE — Patient Instructions (Addendum)
Good to see you  Matthew [name] is your friend.  Stay active.  Doxy 2 times a day for 10 days  See me again wed at 41

## 2018-06-05 LAB — SYNOVIAL CELL COUNT + DIFF, W/ CRYSTALS
Basophils, %: 0 %
Eosinophils-Synovial: 0 % (ref 0–2)
LYMPHOCYTES-SYNOVIAL FLD: 65 % (ref 0–74)
Monocyte/Macrophage: 26 % (ref 0–69)
NEUTROPHIL, SYNOVIAL: 0 % (ref 0–24)
Synoviocytes, %: 9 % (ref 0–15)
WBC, Synovial: 245 cells/uL — ABNORMAL HIGH (ref <150)

## 2018-06-05 LAB — TIQ-NTM

## 2018-06-06 ENCOUNTER — Other Ambulatory Visit: Payer: Self-pay | Admitting: Family Medicine

## 2018-06-12 NOTE — Progress Notes (Signed)
[name] [place], [place] [address] Phone: [phone] Subjective:     CC: Left knee pain  FGH:[id number]  Matthew [name] is a 62 y.o. male coming in with complaint of left knee pain. He is still experiencing "crunching" of the joint. He is concerned with this. Denies having any pain.  Patient did have the aspiration of the knee joint done.  Labs showed very minimal Elevation white blood cells     Past Medical History:  Diagnosis Date  . Allergy    RHINITIS  . Anemia   . Arthritis   . Colonic polyp 09/2006   colonoscopy  . GERD (gastroesophageal reflux disease)   . Gout   . Hypertension   . SOB (shortness of breath)    per pt, may be coming from new medication   Past Surgical History:  Procedure Laterality Date  . COLONOSCOPY  09/2006  . KNEE ARTHROSCOPY Left 2011  . [place] SURGERY  1990  . TOTAL KNEE ARTHROPLASTY Left 11/06/2017   Procedure: TOTAL KNEE ARTHROPLASTY;  Surgeon: Dorna Leitz, MD;  Location: [facility];  Service: Orthopedics;  Laterality: Left;   Social History   Socioeconomic History  . Marital status: Married    Spouse name: Not on file  . Number of children: 1  . Years of education: Not on file  . Highest education level: Not on file  Occupational History  . Not on file  Social Needs  . Financial resource strain: Not on file  . Food insecurity:    Worry: Not on file    Inability: Not on file  . Transportation needs:    Medical: Not on file    Non-medical: Not on file  Tobacco Use  . Smoking status: Current Some Day Smoker    Types: Cigars  . Smokeless tobacco: Never Used  . Tobacco comment: occasional cigar  Substance and Sexual Activity  . Alcohol use: Yes    Alcohol/week: 3.0 standard drinks    Types: 3 Standard drinks or equivalent per week    Comment: occ 2-3 beers  . Drug use: No  . Sexual activity: Yes  Lifestyle  . Physical activity:    Days per week: Not on file    Minutes per  session: Not on file  . Stress: Not on file  Relationships  . Social connections:    Talks on phone: Not on file    Gets together: Not on file    Attends religious service: Not on file    Active member of club or organization: Not on file    Attends meetings of clubs or organizations: Not on file    Relationship status: Not on file  Other Topics Concern  . Not on file  Social History Narrative  . Not on file   Allergies  Allergen Reactions  . Asa [Aspirin] Rash    High dosage ASA causes break out  . Demerol Itching   Family History  Problem Relation Age of Onset  . Stroke Father   . Colon cancer Neg Hx   . Rectal cancer Neg Hx      Past medical history, social, surgical and family history all reviewed in electronic medical record.  No pertanent information unless stated regarding to the chief complaint.   Review of Systems:Review of systems updated and as accurate as of 06/12/18  No headache, visual changes, nausea, vomiting, diarrhea, constipation, dizziness, abdominal pain, skin rash, fevers, chills, night sweats, weight  loss, swollen lymph nodes, body aches, joint swelling, muscle aches, chest pain, shortness of breath, mood changes.   Objective  There were no vitals taken for this visit. Systems examined below as of 06/12/18   General: No apparent distress alert and oriented x3 mood and affect normal, dressed appropriately.  HEENT: Pupils equal, extraocular movements intact  Respiratory: Patient's speak in full sentences and does not appear short of breath  Cardiovascular: No lower extremity edema, non tender, no erythema  Skin: Warm dry intact with no signs of infection or rash on extremities or on axial skeleton.  Abdomen: Soft nontender  Neuro: Cranial nerves II through XII are intact, neurovascularly intact in all extremities with 2+ DTRs and 2+ pulses.  Lymph: No lymphadenopathy of posterior or anterior cervical chain or axillae bilaterally.  Gait normal with  good balance and coordination.  MSK:  Non tender with full range of motion and good stability and symmetric strength and tone of shoulders, elbows, wrist, hip, and ankles bilaterally.  Left knee replacement shows that patient still has some mild warmness.  Swelling is improved.  Still significant crepitus.  No significant instability noted.    Impression and Recommendations:     This case required medical decision making of moderate complexity.      Note: This dictation was prepared with Dragon dictation along with smaller phrase technology. Any transcriptional errors that result from this process are unintentional.

## 2018-06-13 ENCOUNTER — Ambulatory Visit: Payer: 59 | Attending: Family Medicine

## 2018-06-13 ENCOUNTER — Other Ambulatory Visit: Payer: Self-pay

## 2018-06-13 ENCOUNTER — Ambulatory Visit (INDEPENDENT_AMBULATORY_CARE_PROVIDER_SITE_OTHER): Payer: 59 | Admitting: Family Medicine

## 2018-06-13 DIAGNOSIS — G8929 Other chronic pain: Secondary | ICD-10-CM | POA: Diagnosis not present

## 2018-06-13 DIAGNOSIS — M25562 Pain in left knee: Secondary | ICD-10-CM | POA: Insufficient documentation

## 2018-06-13 DIAGNOSIS — M6281 Muscle weakness (generalized): Secondary | ICD-10-CM | POA: Diagnosis not present

## 2018-06-13 DIAGNOSIS — Z96652 Presence of left artificial knee joint: Secondary | ICD-10-CM | POA: Diagnosis not present

## 2018-06-13 MED ORDER — MONTELUKAST SODIUM 10 MG PO TABS: 10.0000 mg | ORAL_TABLET | Freq: Every day | ORAL | 3 refills | Status: DC

## 2018-06-13 NOTE — Assessment & Plan Note (Signed)
Patient states that the pain may be somewhat improved and patient swelling today does seem to be better.  Do not see any significant instability but does have significant amount of crepitus.  Patient has no fevers chills but we discussed the possibility of sedimentation rate as well as chromium and cobalt if patient would want as well as an MRI.  Possible allergic reaction to the metal and patient will be started on Singulair to see how patient responds.  Depending on this patient will follow-up in 2 weeks and worsening symptoms consider more the advanced imaging.

## 2018-06-13 NOTE — Patient Instructions (Signed)
Access Code: [email or web address]  URL: [email or web address]  Date: 06/13/2018  Prepared by: Sigurd Sos   Exercises  Supine Active Straight Leg Raise - 10 reps - 2x daily - 7x weekly  Wall Squat Hold with Ball - 10 reps - 2 sets - 5 hold - 2x daily - 7x weekly  Wall Squat - 10 reps - 2 sets - 5 hold - 2x daily - 7x weekly  Single Leg Press - 10 reps - 3 sets - 1x daily - 7x weekly  Full Leg Press - 10 reps - 2 sets - 1x daily - 7x weekly  Forward Step Down - 10 reps - 2 sets - 2x daily - 7x weekly

## 2018-06-13 NOTE — Therapy (Signed)
[facility] [address] [place], [place], [address] Phone: [phone]   Fax:  [phone]  Physical Therapy Evaluation  Patient Details  Name: Matthew [name] MRN: [id number] Date of Birth: January 12, 1956 Referring Provider: Hulan Saas, MD   Encounter Date: 06/13/2018  PT End of Session - 06/13/18 1313    Visit Number  1    Date for PT Re-Evaluation  08/08/18    Authorization Type  [employer]    PT Start Time  1232    PT Stop Time  1315    PT Time Calculation (min)  43 min    Activity Tolerance  Patient tolerated treatment well    Behavior During Therapy  [facility] for tasks assessed/performed       Past Medical History:  Diagnosis Date  . Allergy    RHINITIS  . Anemia   . Arthritis   . Colonic polyp 09/2006   colonoscopy  . GERD (gastroesophageal reflux disease)   . Gout   . Hypertension   . SOB (shortness of breath)    per pt, may be coming from new medication    Past Surgical History:  Procedure Laterality Date  . COLONOSCOPY  09/2006  . KNEE ARTHROSCOPY Left 2011  . [place] SURGERY  1990  . TOTAL KNEE ARTHROPLASTY Left 11/06/2017   Procedure: TOTAL KNEE ARTHROPLASTY;  Surgeon: Dorna Leitz, MD;  Location: [facility];  Service: Orthopedics;  Laterality: Left;    There were no vitals filed for this visit.   Subjective Assessment - 06/13/18 1236    Subjective  Pt reports to PT s/p Lt TKA performed 11/06/17 with report of high level functional weakness.  Pt had increased fluid in the Lt knee and this was drained 06/04/18.  Pt is active with a gym program including elliptical and squats.      Pertinent History  Lt TKA 11/06/17    Limitations  Standing    How long can you stand comfortably?  1-1.5 hours- edema    Patient Stated Goals  improve strength in Lt knee    Currently in Pain?  No/denies    Pain Location  Knee    Pain Orientation  Left    Pain Descriptors / Indicators  Tightness         [facility] PT  Assessment - 06/13/18 0001      Assessment   Medical Diagnosis  Chronic pain of Lt knee    Referring Provider  Hulan Saas, MD    Onset Date/Surgical Date  11/06/17    Next MD Visit  none scheduled    Prior Therapy  after TKA      Precautions   Precautions  None      Restrictions   Weight Bearing Restrictions  No      Balance Screen   Has the patient fallen in the past 6 months  No    Has the patient had a decrease in activity level because of a fear of falling?   No    Is the patient reluctant to leave their home because of a fear of falling?   No      Home Environment   Living Environment  Private residence    Living Arrangements  Spouse/significant other    Home Layout  Two level      Prior Function   Level of Independence  Independent    Vocation  Full time employment    Vocation Requirements  desk work, driving  Leisure  exercise, walk, golf       Cognition   Overall Cognitive Status  Within Functional Limits for tasks assessed      Observation/Other Assessments   Focus on Therapeutic Outcomes (FOTO)   28% limitation      Observation/Other Assessments-Edema    Edema  --   palpable crepitus posterior to patella secondary to fluid     Posture/Postural Control   Posture/Postural Control  No significant limitations    Posture Comments  Rt genu varus, ER at Lt hip      ROM / Strength   AROM / PROM / Strength  AROM;PROM;Strength      AROM   Overall AROM   Within functional limits for tasks performed      PROM   Overall PROM   Within functional limits for tasks performed      Strength   Overall Strength  Deficits    Overall Strength Comments  reduced eccentric control of Lt quad in weightbearing with Lt hip adduction    Strength Assessment Site  Knee;Hip    Right/Left Hip  Right;Left    Right Hip Flexion  5/5    Right Hip Extension  4+/5    Right Hip ABduction  5/5    Left Hip Flexion  4+/5    Left Hip Extension  4+/5    Left Hip ABduction  4+/5     Right/Left Knee  Right;Left    Right Knee Flexion  5/5    Right Knee Extension  5/5    Left Knee Flexion  5/5    Left Knee Extension  4+/5      Palpation   Patella mobility  reduced patellar mobility on the Lt with crepitus     Palpation comment  mild warmth over Lt lateral patella      Transfers   Transfers  Independent with all Transfers      Ambulation/Gait   Ambulation/Gait  Yes    Gait Pattern  Within Functional Limits    Stairs  Yes    Gait Comments  reduced eccentric strength with descending 6" step                Objective measurements completed on examination: See above findings.              PT Education - 06/13/18 1312    Education Details   Access Code: [email or web address]     Person(s) Educated  Patient    Methods  Explanation;Handout;Demonstration    Comprehension  Verbalized understanding;Returned demonstration       PT Short Term Goals - 06/13/18 1320      PT SHORT TERM GOAL #1   Title  be indpendent in HEP and gym     Time  4    Period  Weeks    Status  New    Target Date  07/11/18      PT SHORT TERM GOAL #2   Title  improve functional Lt knee strength to descend step with neutral alignment    Time  4    Period  Weeks    Status  New    Target Date  07/11/18        PT Long Term Goals - 06/13/18 1325      PT LONG TERM GOAL #1   Title  be independent in advanced HEP    Time  8    Period  Weeks    Status  New  Target Date  08/08/18      PT LONG TERM GOAL #2   Title  improve hip and knee strength to squat without limitation    Time  8    Period  Weeks    Status  New    Target Date  08/08/18      PT LONG TERM GOAL #3   Title  improve knee strength to perform step down with good eccentric control    Time  8    Period  Weeks    Status  New    Target Date  08/08/18      PT LONG TERM GOAL #4   Title  descend inclines without Lt LE weakness or pain    Time  8    Period  Weeks    Status  New    Target Date  08/08/18              Plan - 06/13/18 1330    Clinical Impression Statement  Pt presents to PT s/p Lt TKA performed 11/16/17.  Pt had rehab after surgery and reports that he is still having difficulty with high level strength tasks including descending steps/inclines and squatting. Pt has crepitus in Lt knee with sit to stand transition due to fluid and MD drained the knee 2 weeks ago.  Pt is active with gym exercises and plays golf regularly.  Pt demonstrates Lt>Rt hip and knee functional weakness and demonstrates hip adduction and ER at hip with descending steps.  Pt requires tactile and verbal cues for alignment with exercise.  Pt will attend PT for advancement of HEP and gym exercises for improved hip and knee strength to address functional mobility deficits that remain s/p TKA.      History and Personal Factors relevant to plan of care:  none    Clinical Presentation  Stable    Clinical Decision Making  Low    Rehab Potential  Excellent    PT Frequency  Biweekly    PT Duration  8 weeks    PT Treatment/Interventions  ADLs/Self Care Home Management;Cryotherapy;Electrical Stimulation;Gait training;Stair training;Functional mobility training;Therapeutic activities;Therapeutic exercise;Balance training;Patient/family education;Neuromuscular re-education;Manual techniques;Passive range of motion;Taping    PT Next Visit Plan  follow-up in 2 weeks to see how exercises are going and advance HEP for strength/stability and function    PT Home Exercise Plan  Access Code: [email or web address]     Consulted and Agree with Plan of Care  Patient       Patient will benefit from skilled therapeutic intervention in order to improve the following deficits and impairments:  Decreased strength, Decreased activity tolerance, Decreased endurance  Visit Diagnosis: Chronic pain of left knee - Plan: PT plan of care cert/re-cert  Muscle weakness (generalized) - Plan: PT plan of care cert/re-cert     Problem List Patient Active  Problem List   Diagnosis Date Noted  . Chronic knee pain after total replacement of left knee joint 06/04/2018  . Subungual hematoma of foot, initial encounter 05/29/2018  . Pain and swelling of left knee 05/29/2018  . Heart beat abnormality 12/14/2017  . [name] 11/15/2017  . Primary osteoarthritis of left knee 11/06/2017  . Lipoma of torso 11/02/2017  . Ganglion 11/02/2017  . Sleep deficient 03/22/2017  . Bursitis of left foot 11/11/2016  . Subluxation of tendon of long head of biceps 10/15/2016  . Leg cramping 10/15/2016  . Hamstring tendinitis of left thigh 07/26/2016  . Gout 07/26/2016  . Osteitis pubis (  [place]) 01/27/2016  . Neck tightness 08/12/2015  . Nonallopathic lesion of cervical region 08/12/2015  . Nonallopathic lesion of thoracic region 08/12/2015  . Nonallopathic lesion-rib cage 08/12/2015  . ACE-inhibitor cough 08/12/2015  . GERD (gastroesophageal reflux disease) 08/01/2014  . Neck pain 03/03/2014  . Hypertension 02/14/2014  . Allergic rhinitis 02/14/2014  . Herpes genitalis in men 02/14/2014  . Personal history of colonic polyps 05/06/2011  . Pes planus 04/30/2008    Sigurd Sos, PT 06/13/18 1:34 PM  [facility] [address] [place], [place], [address] Phone: [phone]   Fax:  [phone]  Name: Matthew [name] MRN: [id number] Date of Birth: [birth year]

## 2018-06-24 ENCOUNTER — Other Ambulatory Visit: Payer: Self-pay | Admitting: Family Medicine

## 2018-06-27 ENCOUNTER — Other Ambulatory Visit: Payer: Self-pay | Admitting: *Deleted

## 2018-06-27 ENCOUNTER — Ambulatory Visit: Payer: 59

## 2018-06-27 DIAGNOSIS — Z96652 Presence of left artificial knee joint: Principal | ICD-10-CM

## 2018-06-27 DIAGNOSIS — M25562 Pain in left knee: Principal | ICD-10-CM

## 2018-06-27 DIAGNOSIS — G8929 Other chronic pain: Secondary | ICD-10-CM

## 2018-06-28 ENCOUNTER — Ambulatory Visit: Payer: 59

## 2018-06-28 DIAGNOSIS — M6281 Muscle weakness (generalized): Secondary | ICD-10-CM

## 2018-06-28 DIAGNOSIS — G8929 Other chronic pain: Secondary | ICD-10-CM

## 2018-06-28 DIAGNOSIS — M25562 Pain in left knee: Principal | ICD-10-CM

## 2018-06-28 NOTE — Therapy (Addendum)
[facility] [address] [place], [place], [address] Phone: [phone]   Fax:  [phone]  Physical Therapy Treatment  Patient Details  Name: Matthew [name] MRN: [id number] Date of Birth: Aug 13, 1956 Referring Provider: Hulan Saas, MD   Encounter Date: 06/28/2018  PT End of Session - 06/28/18 1146    Visit Number  2    Date for PT Re-Evaluation  08/08/18    Authorization Type  [employer]HC    PT Start Time  1103    PT Stop Time  1126   Pt asked to leave early   PT Time Calculation (min)  23 min    Activity Tolerance  Patient tolerated treatment well    Behavior During Therapy  [facility] for tasks assessed/performed       Past Medical History:  Diagnosis Date  . Allergy    RHINITIS  . Anemia   . Arthritis   . Colonic polyp 09/2006   colonoscopy  . GERD (gastroesophageal reflux disease)   . Gout   . Hypertension   . SOB (shortness of breath)    per pt, may be coming from new medication    Past Surgical History:  Procedure Laterality Date  . COLONOSCOPY  09/2006  . KNEE ARTHROSCOPY Left 2011  . [place] SURGERY  1990  . TOTAL KNEE ARTHROPLASTY Left 11/06/2017   Procedure: TOTAL KNEE ARTHROPLASTY;  Surgeon: Dorna Leitz, MD;  Location: [facility];  Service: Orthopedics;  Laterality: Left;    There were no vitals filed for this visit.  Subjective Assessment - 06/28/18 1103    Subjective  I have been doing OK with my exercises at home and at the gym.      Pertinent History  Lt TKA 11/06/17    Currently in Pain?  No/denies                       [facility] Adult PT Treatment/Exercise - 06/28/18 0001      Exercises   Exercises  Knee/Hip      Knee/Hip Exercises: Standing   Hip Abduction  Stengthening;Right;20 reps;Limitations    Abduction Limitations  Lt LE standing on bosu ball    Forward Step Up  Both;Left;20 reps    Step Down  Right;2 sets;10 reps    Step Down Limitations  improved Lt hip  alignment    SLS with Vectors  on Lt using slider x10 reps   verbal cues for Lt knee flexion              PT Short Term Goals - 06/13/18 1320      PT SHORT TERM GOAL #1   Title  be indpendent in HEP and gym     Time  4    Period  Weeks    Status  New    Target Date  07/11/18      PT SHORT TERM GOAL #2   Title  improve functional Lt knee strength to descend step with neutral alignment    Time  4    Period  Weeks    Status  New    Target Date  07/11/18        PT Long Term Goals - 06/13/18 1325      PT LONG TERM GOAL #1   Title  be independent in advanced HEP    Time  8    Period  Weeks    Status  New  Target Date  08/08/18      PT LONG TERM GOAL #2   Title  improve hip and knee strength to squat without limitation    Time  8    Period  Weeks    Status  New    Target Date  08/08/18      PT LONG TERM GOAL #3   Title  improve knee strength to perform step down with good eccentric control    Time  8    Period  Weeks    Status  New    Target Date  08/08/18      PT LONG TERM GOAL #4   Title  descend inclines without Lt LE weakness or pain    Time  8    Period  Weeks    Status  New    Target Date  08/08/18            Plan - 06/28/18 1144    Clinical Impression Statement  Pt is independent in initial HEP for Lt knee strength.  Pt with improved Lt knee alignment with step downs today and was able to correct for hip adduction and ER.  PT advanced HEP (pictures not issued due to pt request) for propriocepton and strength of Lt knee.  Pt will have bone scan next week due to continued Lt knee "grinding" with movement. Pt required minimal verbal cues for technique with exercise today for alignment and eccentric control.  Pt will be out of town for work travel and will complete HEP while away and return to PT after this trip for reassessment.      Rehab Potential  Excellent    PT Frequency  Biweekly    PT Duration  8 weeks    PT Treatment/Interventions   ADLs/Self Care Home Management;Cryotherapy;Electrical Stimulation;Gait training;Stair training;Functional mobility training;Therapeutic activities;Therapeutic exercise;Balance training;Patient/family education;Neuromuscular re-education;Manual techniques;Passive range of motion;Taping    PT Next Visit Plan  follow-up in 3-4 weeks to see how exercises are going and advance HEP for strength/stability and function    PT Home Exercise Plan  Access Code: [facility]     Recommended Other Services  initial certification is signed    Consulted and Agree with Plan of Care  Patient       Patient will benefit from skilled therapeutic intervention in order to improve the following deficits and impairments:  Decreased strength, Decreased activity tolerance, Decreased endurance  Visit Diagnosis: Chronic pain of left knee  Muscle weakness (generalized)     Problem List Patient Active Problem List   Diagnosis Date Noted  . Chronic knee pain after total replacement of left knee joint 06/04/2018  . Subungual hematoma of foot, initial encounter 05/29/2018  . Pain and swelling of left knee 05/29/2018  . Heart beat abnormality 12/14/2017  . [name] 11/15/2017  . Primary osteoarthritis of left knee 11/06/2017  . Lipoma of torso 11/02/2017  . Ganglion 11/02/2017  . Sleep deficient 03/22/2017  . Bursitis of left foot 11/11/2016  . Subluxation of tendon of long head of biceps 10/15/2016  . Leg cramping 10/15/2016  . Hamstring tendinitis of left thigh 07/26/2016  . Gout 07/26/2016  . Osteitis pubis ([place]) 01/27/2016  . Neck tightness 08/12/2015  . Nonallopathic lesion of cervical region 08/12/2015  . Nonallopathic lesion of thoracic region 08/12/2015  . Nonallopathic lesion-rib cage 08/12/2015  . ACE-inhibitor cough 08/12/2015  . GERD (gastroesophageal reflux disease) 08/01/2014  . Neck pain 03/03/2014  . Hypertension 02/14/2014  . Allergic  rhinitis 02/14/2014  . Herpes genitalis in men  02/14/2014  . Personal history of colonic polyps 05/06/2011  . Pes planus 04/30/2008    Sigurd Sos, PT 06/28/18 11:51 AM PHYSICAL THERAPY DISCHARGE SUMMARY  Visits from Start of Care: 2  Current functional level related to goals / functional outcomes: Pt didn't return to PT after 06/28/18.  See above for most current status.     Remaining deficits: See above.   Education / Equipment: HEP Plan: Patient agrees to discharge.  Patient goals were partially met. Patient is being discharged due to not returning since the last visit.  ?????        Sigurd Sos, PT 09/03/18 1:55 PM  [facility] [address], [place], [place], [address] Phone: [phone]   Fax:  [phone]  Name: Matthew [name] MRN: [id number] Date of Birth: 02-Jan-1956

## 2018-06-29 ENCOUNTER — Encounter (HOSPITAL_COMMUNITY): Payer: 59

## 2018-06-29 ENCOUNTER — Other Ambulatory Visit (HOSPITAL_COMMUNITY): Payer: 59

## 2018-07-04 ENCOUNTER — Encounter (HOSPITAL_COMMUNITY): Payer: 59

## 2018-07-04 ENCOUNTER — Encounter (HOSPITAL_COMMUNITY)
Admission: RE | Admit: 2018-07-04 | Discharge: 2018-07-04 | Disposition: A | Discharge status: Home / Self Care | Payer: 59 | Source: Ambulatory Visit | Attending: Family Medicine | Admitting: Family Medicine

## 2018-07-04 DIAGNOSIS — Z96652 Presence of left artificial knee joint: Secondary | ICD-10-CM | POA: Diagnosis not present

## 2018-07-04 DIAGNOSIS — M25562 Pain in left knee: Secondary | ICD-10-CM | POA: Insufficient documentation

## 2018-07-04 DIAGNOSIS — X32XXXD Exposure to sunlight, subsequent encounter: Secondary | ICD-10-CM | POA: Diagnosis not present

## 2018-07-04 DIAGNOSIS — G8929 Other chronic pain: Secondary | ICD-10-CM | POA: Diagnosis not present

## 2018-07-04 DIAGNOSIS — L570 Actinic keratosis: Secondary | ICD-10-CM | POA: Diagnosis not present

## 2018-07-04 DIAGNOSIS — Z471 Aftercare following joint replacement surgery: Secondary | ICD-10-CM | POA: Diagnosis not present

## 2018-07-04 MED ORDER — TECHNETIUM TC 99M MEDRONATE IV KIT
20.0000 | PACK | Freq: Once | INTRAVENOUS | Status: AC | PRN
  Administered 2018-07-04: 20 via INTRAVENOUS

## 2018-07-05 ENCOUNTER — Other Ambulatory Visit: Payer: Self-pay | Admitting: Family Medicine

## 2018-07-05 MED ORDER — PREDNISONE 50 MG PO TABS: 50.0000 mg | ORAL_TABLET | Freq: Every day | ORAL | 0 refills | Status: DC

## 2018-07-05 MED ORDER — DOXYCYCLINE HYCLATE 100 MG PO TABS: 100.0000 mg | ORAL_TABLET | Freq: Two times a day (BID) | ORAL | 0 refills | Status: AC

## 2018-07-05 NOTE — Progress Notes (Signed)
~  Medications with patient traveling out of the country

## 2018-07-06 ENCOUNTER — Other Ambulatory Visit: Payer: Self-pay | Admitting: Family Medicine

## 2018-07-09 ENCOUNTER — Other Ambulatory Visit: Payer: Self-pay | Admitting: *Deleted

## 2018-07-09 NOTE — Telephone Encounter (Signed)
Refill done.  

## 2018-07-10 DIAGNOSIS — M25462 Effusion, left knee: Secondary | ICD-10-CM | POA: Diagnosis not present

## 2018-07-10 DIAGNOSIS — M25562 Pain in left knee: Secondary | ICD-10-CM | POA: Diagnosis not present

## 2018-08-09 DIAGNOSIS — M25562 Pain in left knee: Secondary | ICD-10-CM | POA: Diagnosis not present

## 2018-08-13 ENCOUNTER — Ambulatory Visit: Payer: 59

## 2018-08-24 DIAGNOSIS — Z23 Encounter for immunization: Secondary | ICD-10-CM | POA: Diagnosis not present

## 2018-08-31 DIAGNOSIS — M67864 Other specified disorders of tendon, left knee: Secondary | ICD-10-CM | POA: Diagnosis not present

## 2018-08-31 DIAGNOSIS — M24662 Ankylosis, left knee: Secondary | ICD-10-CM | POA: Diagnosis not present

## 2018-08-31 DIAGNOSIS — Z96652 Presence of left artificial knee joint: Secondary | ICD-10-CM | POA: Diagnosis not present

## 2018-08-31 HISTORY — PX: KNEE SURGERY: SHX244

## 2018-09-13 DIAGNOSIS — Z4789 Encounter for other orthopedic aftercare: Secondary | ICD-10-CM | POA: Diagnosis not present

## 2018-09-13 DIAGNOSIS — Z96652 Presence of left artificial knee joint: Secondary | ICD-10-CM | POA: Diagnosis not present

## 2018-09-27 ENCOUNTER — Other Ambulatory Visit: Payer: Self-pay | Admitting: Family Medicine

## 2018-11-06 ENCOUNTER — Encounter: Payer: Self-pay | Admitting: Family Medicine

## 2018-11-07 ENCOUNTER — Other Ambulatory Visit: Payer: Self-pay | Admitting: Family Medicine

## 2018-11-07 NOTE — Progress Notes (Signed)
[name] [address] [place], [place] [address] Phone: [phone] Subjective:     CC: groin pain   MVH:[id number]  TRASEAN [name] is a 63 y.o. male coming in with complaint of right sided groin pain for 2 years. Has had increase in pain over past 60 days. Painful with lumbar flexion, sit ups. Dull pain with walking and running. Has not had sharp pain. Denies any radiating symptoms.  Does not stop him from activity but sometimes gives him discomfort with certain activities as provided above.    Past Medical History:  Diagnosis Date  . Allergy    RHINITIS  . Anemia   . Arthritis   . Colonic polyp 09/2006   colonoscopy  . GERD (gastroesophageal reflux disease)   . Gout   . Hypertension   . SOB (shortness of breath)    per pt, may be coming from new medication   Past Surgical History:  Procedure Laterality Date  . COLONOSCOPY  09/2006  . KNEE ARTHROSCOPY Left 2011  . [place] SURGERY  1990  . TOTAL KNEE ARTHROPLASTY Left 11/06/2017   Procedure: TOTAL KNEE ARTHROPLASTY;  Surgeon: Dorna Leitz, MD;  Location: [facility];  Service: Orthopedics;  Laterality: Left;   Social History   Socioeconomic History  . Marital status: Married    Spouse name: Not on file  . Number of children: 1  . Years of education: Not on file  . Highest education level: Not on file  Occupational History  . Not on file  Social Needs  . Financial resource strain: Not on file  . Food insecurity:    Worry: Not on file    Inability: Not on file  . Transportation needs:    Medical: Not on file    Non-medical: Not on file  Tobacco Use  . Smoking status: Current Some Day Smoker    Types: Cigars  . Smokeless tobacco: Never Used  . Tobacco comment: occasional cigar  Substance and Sexual Activity  . Alcohol use: Yes    Alcohol/week: 3.0 standard drinks    Types: 3 Standard drinks or equivalent per week    Comment: occ 2-3 beers  . Drug use: No  . Sexual activity:  Yes  Lifestyle  . Physical activity:    Days per week: Not on file    Minutes per session: Not on file  . Stress: Not on file  Relationships  . Social connections:    Talks on phone: Not on file    Gets together: Not on file    Attends religious service: Not on file    Active member of club or organization: Not on file    Attends meetings of clubs or organizations: Not on file    Relationship status: Not on file  Other Topics Concern  . Not on file  Social History Narrative  . Not on file   Allergies  Allergen Reactions  . Asa [Aspirin] Rash    High dosage ASA causes break out  . Demerol Itching   Family History  Problem Relation Age of Onset  . Stroke Father   . Colon cancer Neg Hx   . Rectal cancer Neg Hx     Current Outpatient Medications (Endocrine & Metabolic):  .  predniSONE (DELTASONE) 50 MG tablet, Take 1 tablet (50 mg total) by mouth daily.   Current Outpatient Medications (Cardiovascular):  .  amLODipine (NORVASC) 10 MG tablet, Take 1 tablet (10 mg total) by mouth  at bedtime. [name]  losartan-hydrochlorothiazide (HYZAAR) 100-25 MG tablet, Take 1 tablet by mouth daily. .  metoprolol tartrate (LOPRESSOR) 25 MG tablet, Take 0.5 tablets (12.5 mg total) by mouth 2 (two) times daily.   Current Outpatient Medications (Respiratory):  .  fexofenadine (ALLEGRA) 180 MG tablet, Take 180 mg by mouth at bedtime.  .  montelukast (SINGULAIR) 10 MG tablet, Take 1 tablet (10 mg total) by mouth at bedtime.   Current Outpatient Medications (Analgesics):  .  allopurinol (ZYLOPRIM) 300 MG tablet, Take 1 tablet (300 mg total) by mouth daily. .  traMADol (ULTRAM) 50 MG tablet, TAKE 1 OR 2 TABLETS EVERY 6 TO 8 HOURS AS NEEDED FOR PAIN .  meloxicam (MOBIC) 15 MG tablet, Take 1 tablet (15 mg total) by mouth daily.   Current Outpatient Medications (Hematological):  [name]  Ferrous Sulfate (IRON) 325 (65 FE) MG TABS, Take 325 mg by mouth daily.    Current Outpatient Medications (Other):    [name]  Artificial Tear Ointment (LUBRICANT EYE OP), Place 1 drop into both eyes daily. .  cholecalciferol (VITAMIN D) 1000 units tablet, Take 1,000 Units by mouth daily. .  fish oil-omega-3 fatty acids 1000 MG capsule, Take 2 g by mouth daily.  .  Multiple Vitamin (MULTIVITAMIN) tablet, Take 1 tablet by mouth daily.   [name]  OVER THE COUNTER MEDICATION, Take 3 pills at bedtime .  traZODone (DESYREL) 50 MG tablet, Take 0.5-1 tablets (25-50 mg total) by mouth at bedtime as needed for sleep. .  TURMERIC PO, Take 1 capsule by mouth daily. [name]  UNABLE TO FIND, Med Name: tartcherry .  valACYclovir (VALTREX) 1000 MG tablet, TAKE 1 TABLET BY MOUTH EVERY DAY .  Vitamin D, Ergocalciferol, (DRISDOL) 1.25 MG (50000 UT) CAPS capsule, TAKE 1 CAPSULE (50,000 UNITS TOTAL) BY MOUTH EVERY 7 (SEVEN) DAYS.  Current Facility-Administered Medications (Other):  .  0.9 %  sodium chloride infusion    Past medical history, social, surgical and family history all reviewed in electronic medical record.  No pertanent information unless stated regarding to the chief complaint.   Review of Systems:  No headache, visual changes, nausea, vomiting, diarrhea, constipation, dizziness, abdominal pain, skin rash, fevers, chills, night sweats, weight loss, swollen lymph nodes, body aches, joint swelling, muscle aches, chest pain, shortness of breath, mood changes.   Objective  Blood pressure 122/82, pulse 69, height 6\' 1"  (1.854 m), weight 202 lb (91.6 kg), SpO2 99 %.    General: No apparent distress alert and oriented x3 mood and affect normal, dressed appropriately.  HEENT: Pupils equal, extraocular movements intact  Respiratory: Patient's speak in full sentences and does not appear short of breath  Cardiovascular: No lower extremity edema, non tender, no erythema  Skin: Warm dry intact with no signs of infection or rash on extremities or on axial skeleton.  Abdomen: Soft nontender  Neuro: Cranial nerves II through XII are  intact, neurovascularly intact in all extremities with 2+ DTRs and 2+ pulses.  Lymph: No lymphadenopathy of posterior or anterior cervical chain or axillae bilaterally.  Gait normal with good balance and coordination.  MSK:  Non tender with full range of motion and good stability and symmetric strength and tone of shoulders, elbows, wrist, , knee and ankles bilaterally.  Left knee replacement noted and seems to be in good repair. Patient's right groin does have severe decrease in external rotation of the hip.  Good internal rotation compared to the contralateral hip.  Mild tender to palpation in the groin  area but no masses appreciated.  With Valsalva no hernia noted.  Limited musculoskeletal ultrasound was performed and interpreted by Lyndal Pulley  Limited ultrasound shows the patient does have some scarring of the adductor approximately 2 cm down the abductor muscle.  Mild increase in Doppler flow in this area.  No avulsion of the pelvic bone noted.  No significant arthritic changes of the hip though noted either. Impression, abductor strain chronic  97110; 15 additional minutes spent for Therapeutic exercises as stated in above notes.  This included exercises focusing on stretching, strengthening, with significant focus on eccentric aspects.   Long term goals include an improvement in range of motion, strength, endurance as well as avoiding reinjury. Patient's frequency would include in 1-2 times a day, 3-5 times a week for a duration of 6-12 weeks. Hip strengthening exercises which included:  Pelvic tilt/bracing to help with proper recruitment of the lower abs and pelvic floor muscles  Glute strengthening to properly contract glutes without over-engaging low back and hamstrings - prone hip extension and glute bridge exercises Proper stretching techniques to increase effectiveness for the hip flexors, groin, quads, piriformic and low back when appropriate    Proper technique shown and discussed  handout in great detail with ATC.  All questions were discussed and answered.     Impression and Recommendations:     This case required medical decision making of moderate complexity. The above documentation has been reviewed and is accurate and complete Lyndal Pulley, DO       Note: This dictation was prepared with Dragon dictation along with smaller phrase technology. Any transcriptional errors that result from this process are unintentional.

## 2018-11-08 ENCOUNTER — Ambulatory Visit: Payer: Self-pay

## 2018-11-08 ENCOUNTER — Ambulatory Visit (INDEPENDENT_AMBULATORY_CARE_PROVIDER_SITE_OTHER): Payer: 59 | Admitting: Family Medicine

## 2018-11-08 ENCOUNTER — Encounter: Payer: Self-pay | Admitting: Family Medicine

## 2018-11-08 ENCOUNTER — Ambulatory Visit (INDEPENDENT_AMBULATORY_CARE_PROVIDER_SITE_OTHER)
Admission: RE | Admit: 2018-11-08 | Discharge: 2018-11-08 | Disposition: A | Discharge status: Home / Self Care | Payer: 59 | Source: Ambulatory Visit | Attending: Family Medicine | Admitting: Family Medicine

## 2018-11-08 VITALS — BP 122/82 | HR 69 | Ht 73.0 in | Wt 202.0 lb

## 2018-11-08 DIAGNOSIS — R1031 Right lower quadrant pain: Secondary | ICD-10-CM

## 2018-11-08 DIAGNOSIS — M869 Osteomyelitis, unspecified: Secondary | ICD-10-CM | POA: Diagnosis not present

## 2018-11-08 DIAGNOSIS — G8929 Other chronic pain: Secondary | ICD-10-CM

## 2018-11-08 DIAGNOSIS — M160 Bilateral primary osteoarthritis of hip: Secondary | ICD-10-CM | POA: Diagnosis not present

## 2018-11-08 HISTORY — DX: Right lower quadrant pain: R10.31

## 2018-11-08 MED ORDER — MELOXICAM 15 MG PO TABS: 15.0000 mg | ORAL_TABLET | Freq: Every day | ORAL | 0 refills | Status: DC

## 2018-11-08 NOTE — Assessment & Plan Note (Signed)
Right groin pain.  Discussed with patient at great length about icing regimen and home exercise.  Discussed topical anti-inflammatories.  Differential includes a sports hernia but appears on ultrasound that there is an abductor chronic straining noted.  Patient is going to try and home exercise, thigh compression, which activities to do which wants to avoid.  Patient will avoid high impact.  Follow-up again in 4 to 6 weeks

## 2018-11-08 NOTE — Assessment & Plan Note (Signed)
Within the differential as well.  Will monitor.

## 2018-11-08 NOTE — Patient Instructions (Addendum)
Happy New Year!  Great to see you as always  Thigh compression size large body helix Pelvis xray downstairs when you can  Exercises 3 times a week.  No sprinting or jumping.  Avoid significant abs for now but isometrics would be good.  See me again in 4 weeks

## 2018-11-08 NOTE — Telephone Encounter (Signed)
[facility] is requesting to fill pt valtrex. Please advise KH 

## 2018-11-25 ENCOUNTER — Other Ambulatory Visit: Payer: Self-pay | Admitting: Family Medicine

## 2018-12-06 ENCOUNTER — Other Ambulatory Visit: Payer: Self-pay | Admitting: Family Medicine

## 2018-12-15 DIAGNOSIS — J029 Acute pharyngitis, unspecified: Secondary | ICD-10-CM | POA: Diagnosis not present

## 2018-12-15 DIAGNOSIS — J069 Acute upper respiratory infection, unspecified: Secondary | ICD-10-CM | POA: Diagnosis not present

## 2018-12-16 ENCOUNTER — Other Ambulatory Visit: Payer: Self-pay | Admitting: Family Medicine

## 2018-12-17 ENCOUNTER — Encounter: Payer: Self-pay | Admitting: Family Medicine

## 2018-12-17 ENCOUNTER — Ambulatory Visit (INDEPENDENT_AMBULATORY_CARE_PROVIDER_SITE_OTHER): Payer: 59 | Admitting: Family Medicine

## 2018-12-17 VITALS — BP 122/80 | HR 84 | Temp 98.4°F | Resp 16 | Ht 72.0 in | Wt 205.8 lb

## 2018-12-17 DIAGNOSIS — J029 Acute pharyngitis, unspecified: Secondary | ICD-10-CM | POA: Diagnosis not present

## 2018-12-17 LAB — POCT RAPID STREP A (OFFICE): RAPID STREP A SCREEN: NEGATIVE

## 2018-12-17 MED ORDER — LIDOCAINE VISCOUS HCL 2 % MT SOLN: 15.0000 mL | OROMUCOSAL | 1 refills | Status: DC | PRN

## 2018-12-17 NOTE — Progress Notes (Signed)
   Subjective:    Patient ID: Matthew [name], male    DOB: 09-24-1956, 63 y.o.   MRN: [id number]  HPI He woke up Saturday with sore throat, sinus congestion, ear congestion, slight headache and dry cough.  No fever, chills, shortness of breath.  He has been using Advil for pain relief.   Review of Systems     Objective:   Physical Exam Alert and in no distress. Tympanic membranes and canals are normal. Pharyngeal area is normal. Neck is supple without adenopathy or thyromegaly. Cardiac exam shows a regular sinus rhythm without murmurs or gallops. Lungs are clear to auscultation. Strep screen is negative       Assessment & Plan:  Pharyngitis, unspecified etiology - Plan: Rapid Strep A, lidocaine (XYLOCAINE) 2 % solution Recommend using Xylocaine half-strength for the sore throat as well as continue on 800 mg of ibuprofen 3 times per day.  He can also use Tylenol for pain relief.  Recommend always using Tylenol before going to an anti-inflammatory.  Explained that he should not use Advil and meloxicam at the same time as there and the same class of drug.

## 2018-12-31 ENCOUNTER — Other Ambulatory Visit: Payer: Self-pay | Admitting: Family Medicine

## 2018-12-31 ENCOUNTER — Other Ambulatory Visit: Payer: Self-pay

## 2018-12-31 MED ORDER — VITAMIN D (ERGOCALCIFEROL) 1.25 MG (50000 UNIT) PO CAPS: 50000.0000 [IU] | ORAL_CAPSULE | ORAL | 0 refills | Status: DC

## 2019-01-04 ENCOUNTER — Encounter: Payer: Self-pay | Admitting: Family Medicine

## 2019-01-28 ENCOUNTER — Other Ambulatory Visit: Payer: Self-pay

## 2019-01-30 ENCOUNTER — Other Ambulatory Visit: Payer: Self-pay

## 2019-01-30 MED ORDER — METOPROLOL TARTRATE 25 MG PO TABS: 12.5000 mg | ORAL_TABLET | Freq: Two times a day (BID) | ORAL | 3 refills | Status: DC

## 2019-01-31 ENCOUNTER — Encounter: Payer: Self-pay | Admitting: *Deleted

## 2019-02-12 IMAGING — CR DG CHEST 2V
2 series · 2 of 2 positions shown · non-contrast
Comparison: None.

CLINICAL DATA: Preop.

EXAM:
CHEST  2 VIEW

[w chest pa]
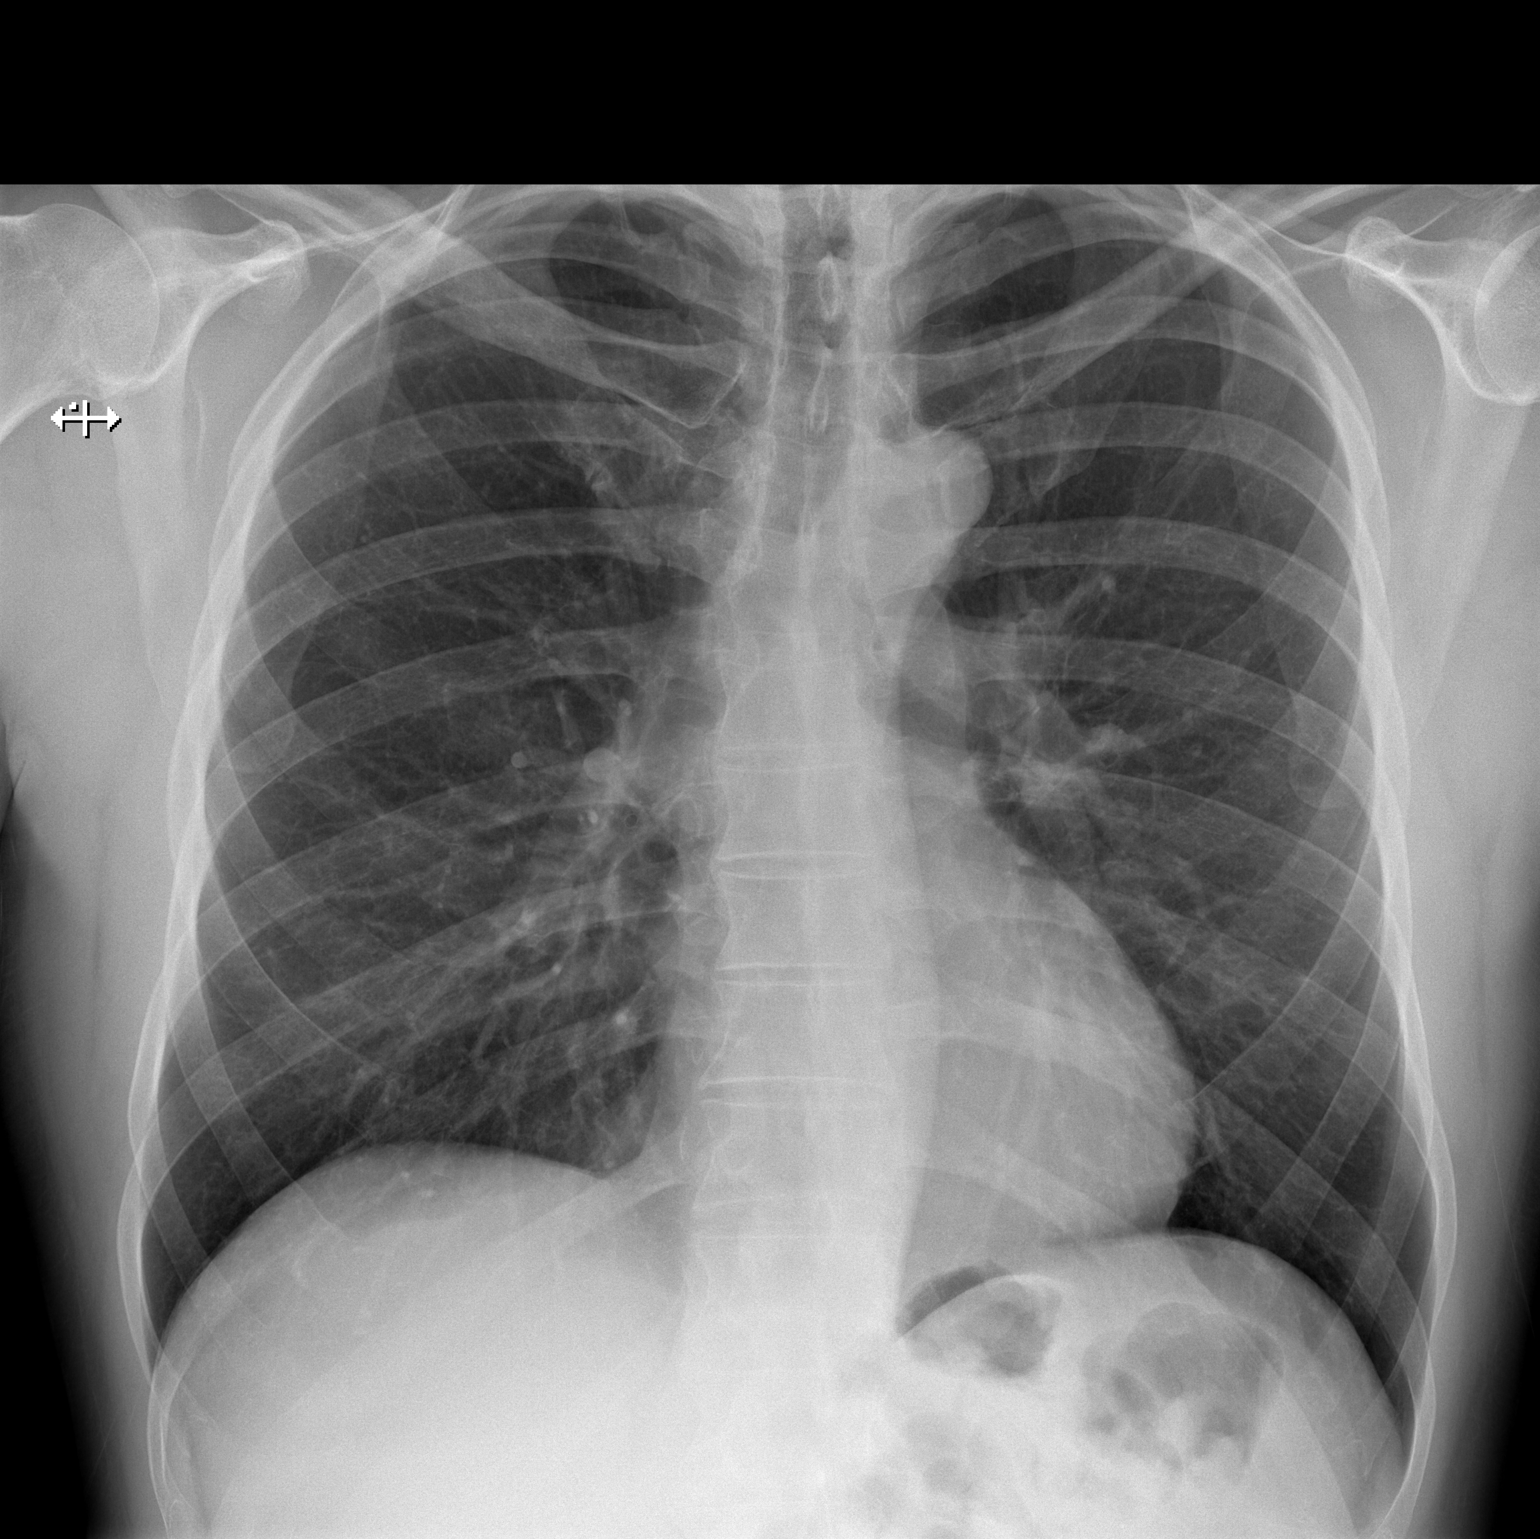

[w chest lat]
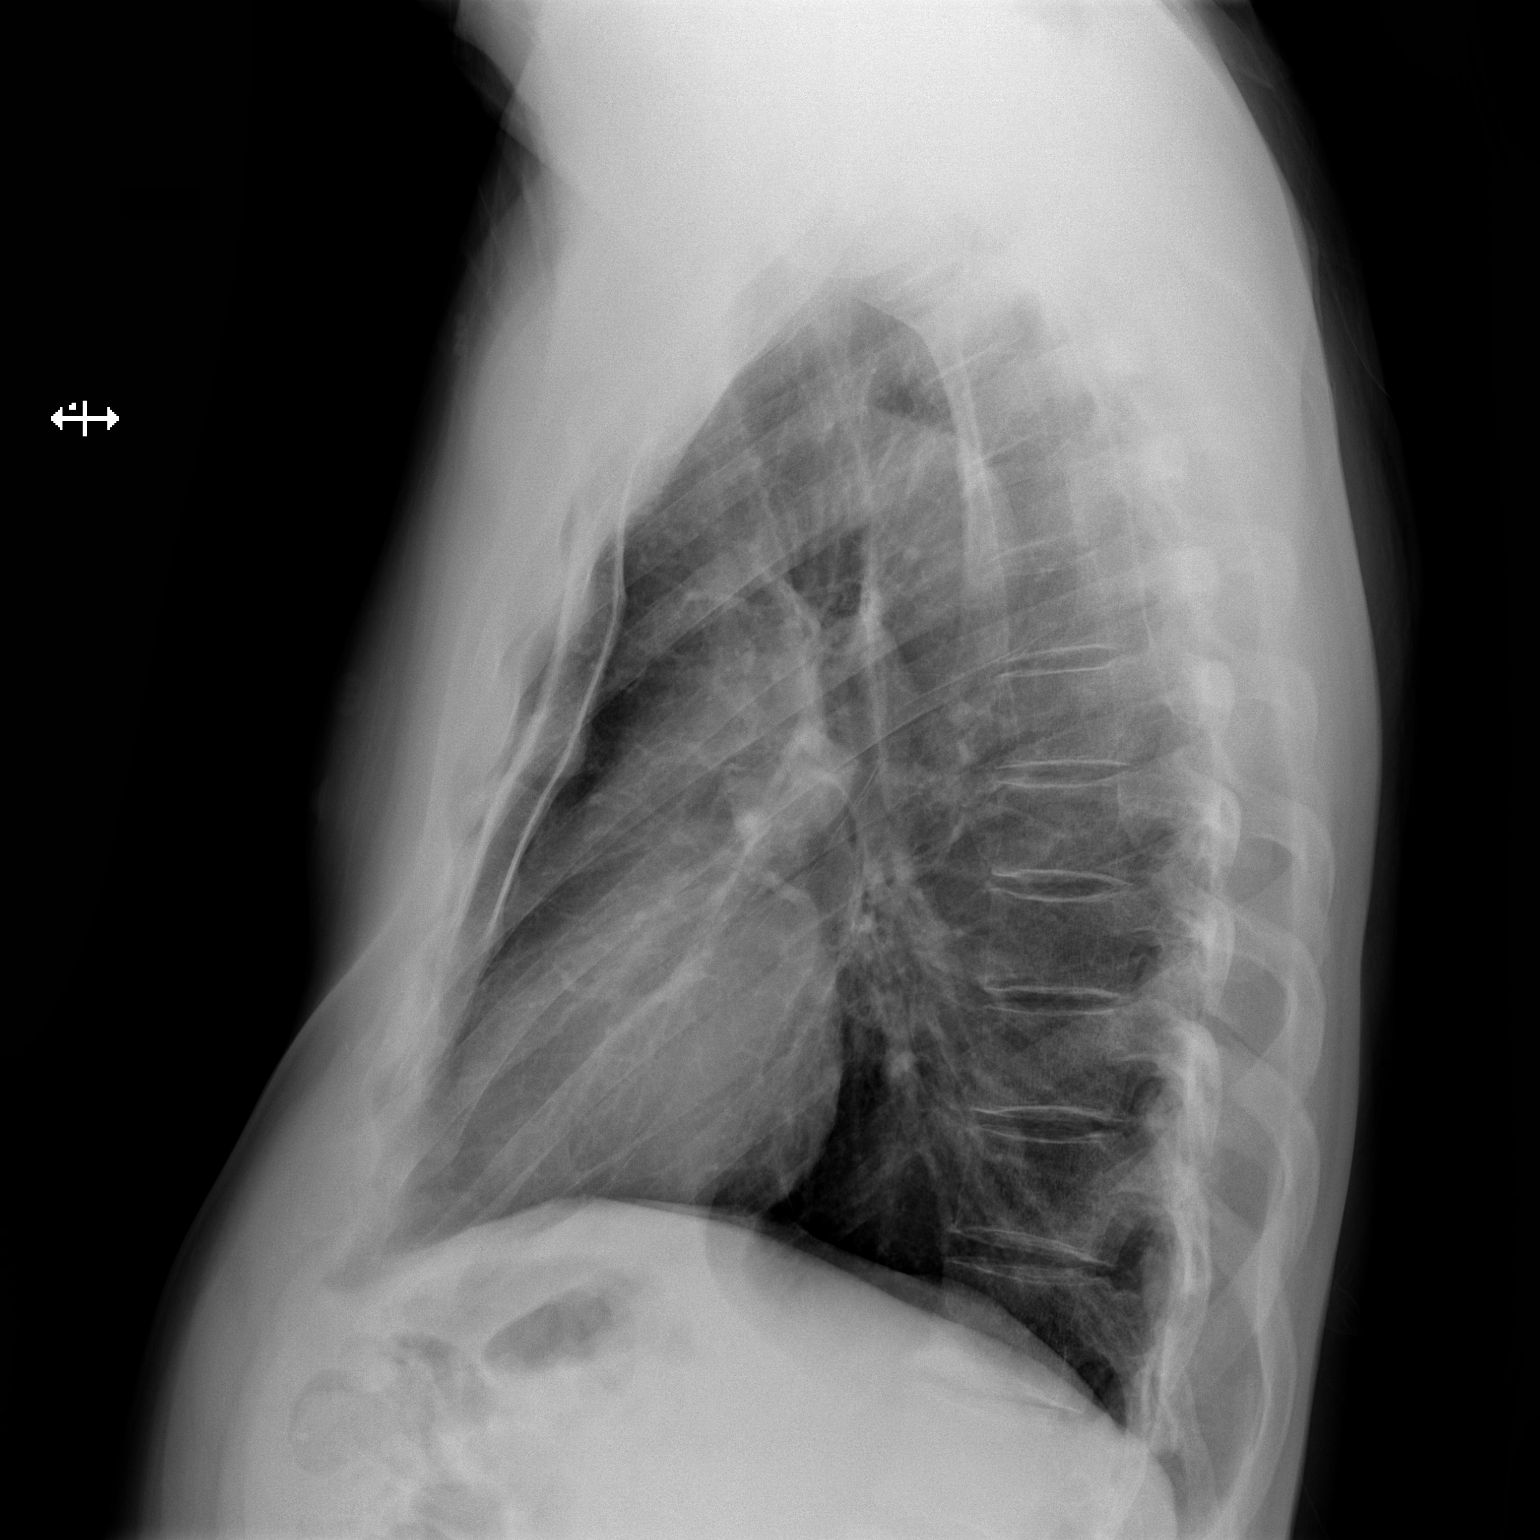

[2 of 2 positions shown; findings below may reference images not displayed]

FINDINGS: Trachea is midline. Heart size normal. Lungs are clear. No pleural
fluid. Pectus deformity.
IMPRESSION: No acute findings.

## 2019-02-27 IMAGING — CT CT ANGIO CHEST
2 of 9 series · 19 of 46 positions shown · IV contrast (iopamidol)
Comparison: None.

CLINICAL DATA: [name].  Elevated D-dimer level.

EXAM:
CT ANGIOGRAPHY CHEST WITH CONTRAST
TECHNIQUE: Multidetector CT imaging of the chest was performed using the
standard protocol during bolus administration of intravenous
contrast. Multiplanar CT image reconstructions and MIPs were
obtained to evaluate the vascular anatomy.
CONTRAST:  100mL [id number] IOPAMIDOL ([id number]) INJECTION 76%

[Series 7: thins · axial · 0.71mm/px · z∈[+1145,+1437]mm · 16 of 322 slices shown]
[im 15/322  lung]
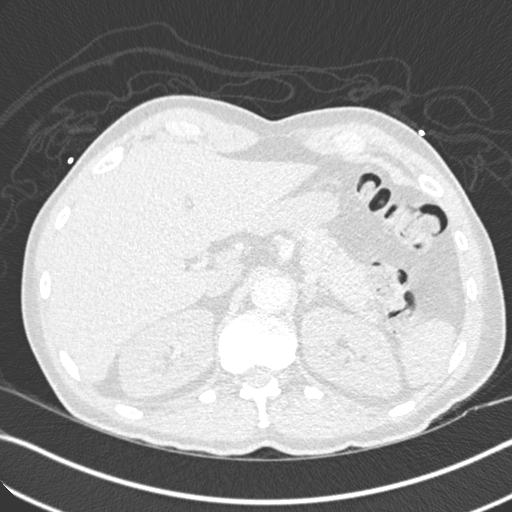
[im 30/322  soft-tissue]
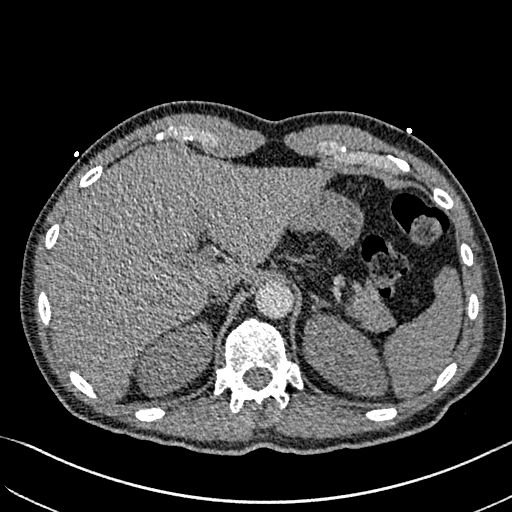
[im 59/322  lung]
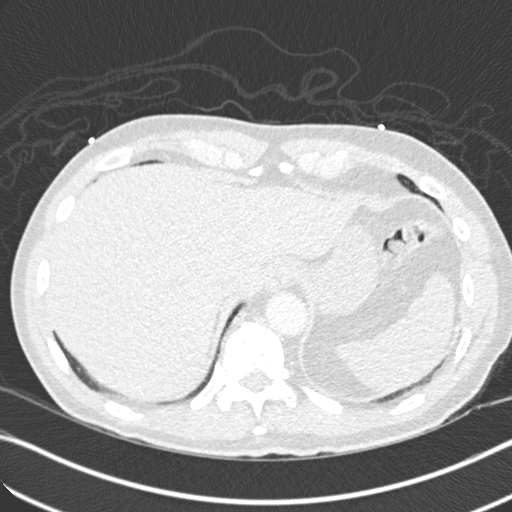
[im 73/322  soft-tissue]
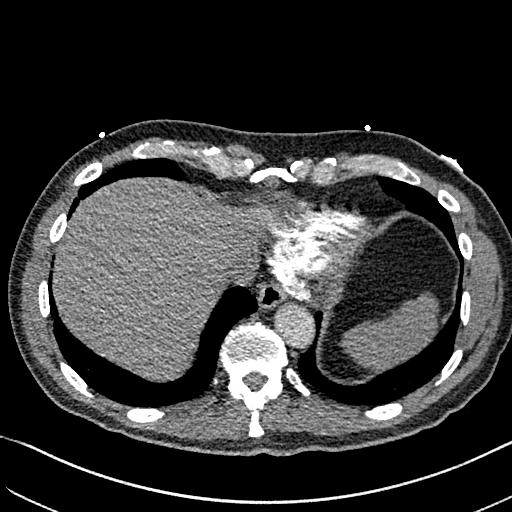
[im 88/322  lung]
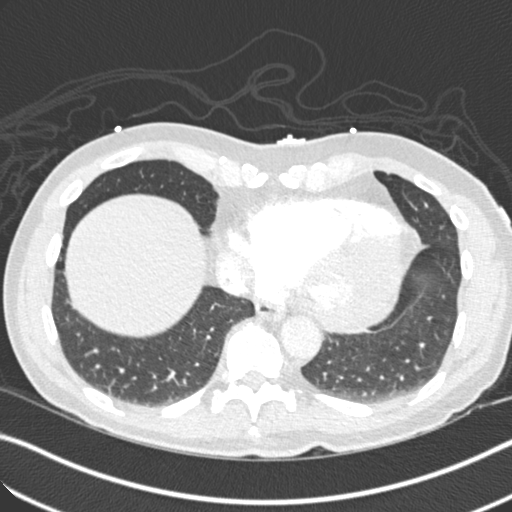
[im 117/322  soft-tissue]
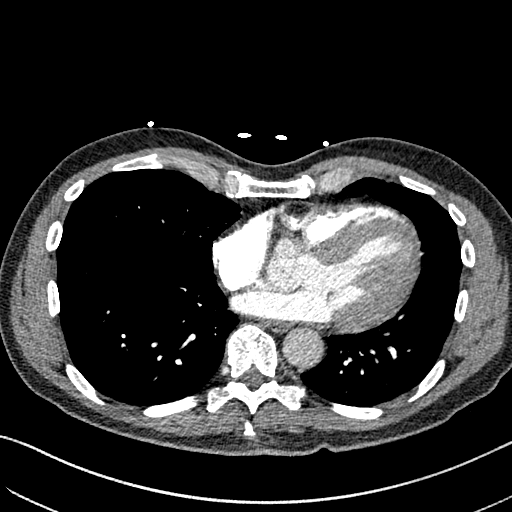
[im 132/322  lung]
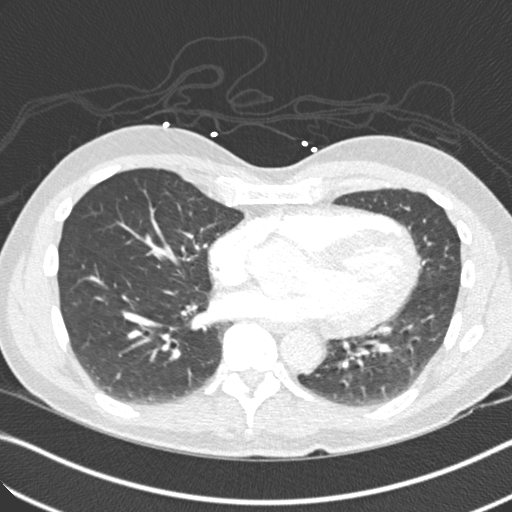
[im 146/322  soft-tissue]
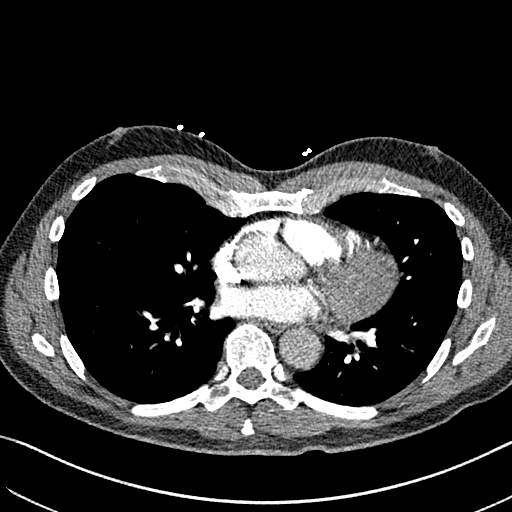
[im 176/322  lung]
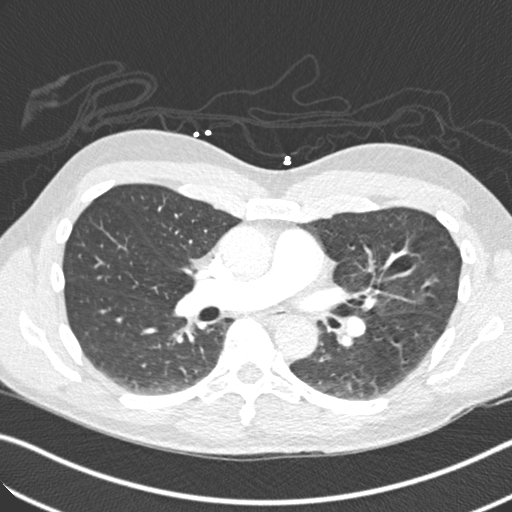
[im 190/322  soft-tissue]
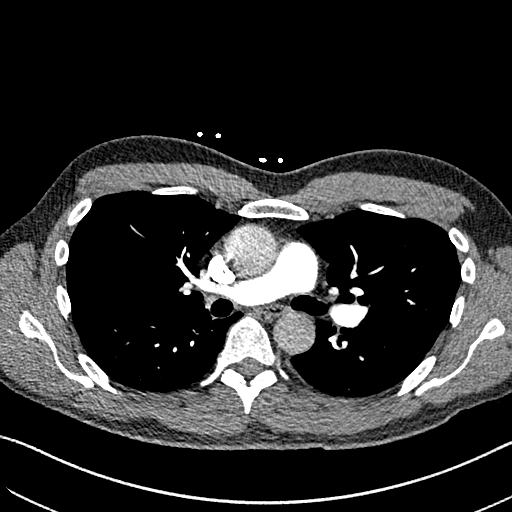
[im 205/322  lung]
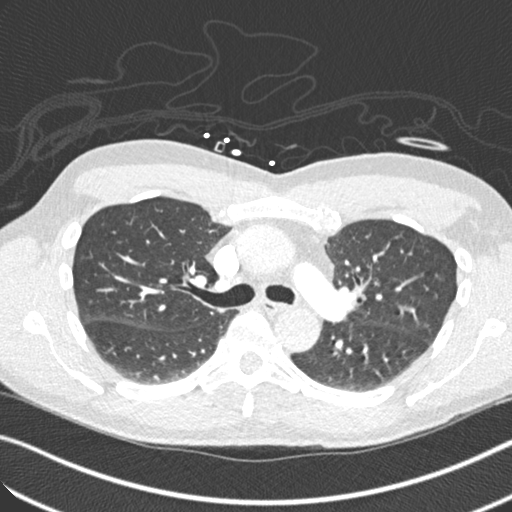
[im 234/322  soft-tissue]
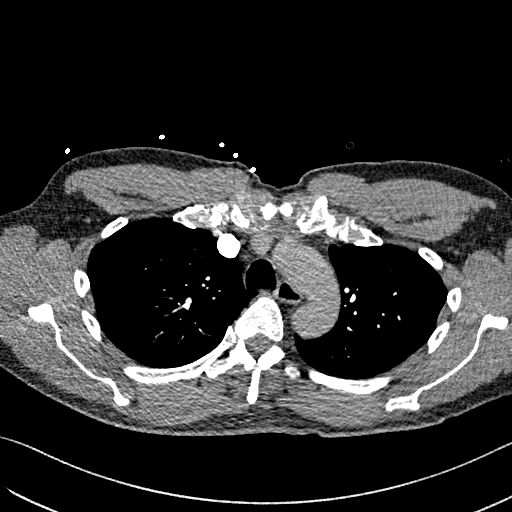
[im 249/322  lung]
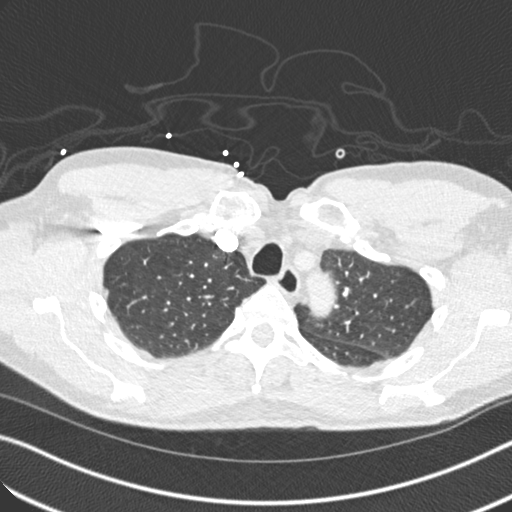
[im 263/322  soft-tissue]
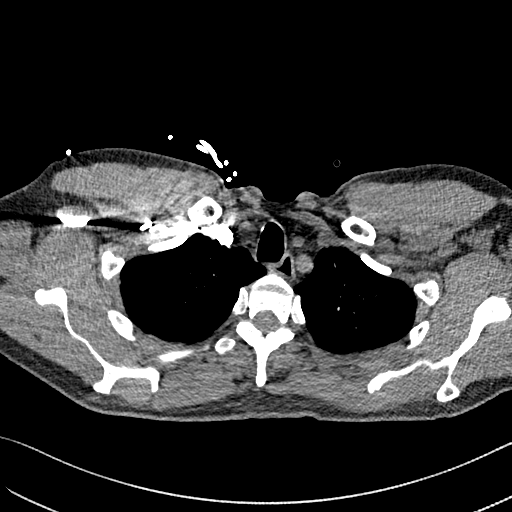
[im 292/322  lung]
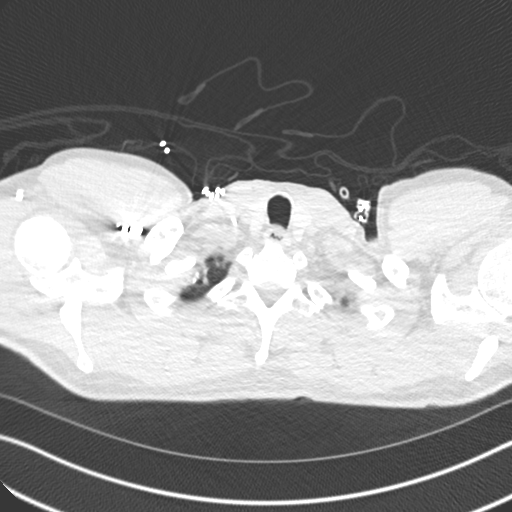
[im 307/322  soft-tissue]
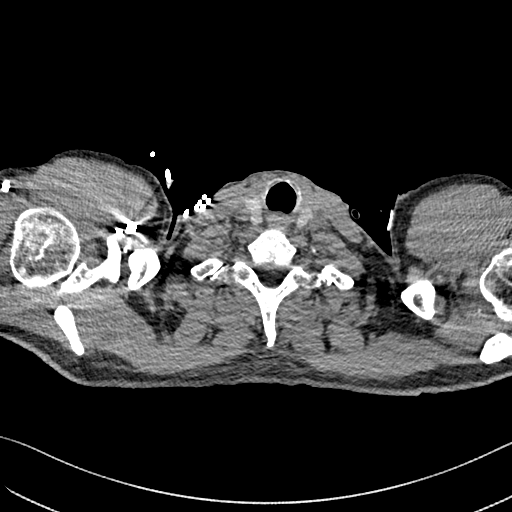

[Series 9: coronal mpr · coronal · 0.63mm/px · 3 of 112 slices shown]
[im 28/112  soft-tissue]
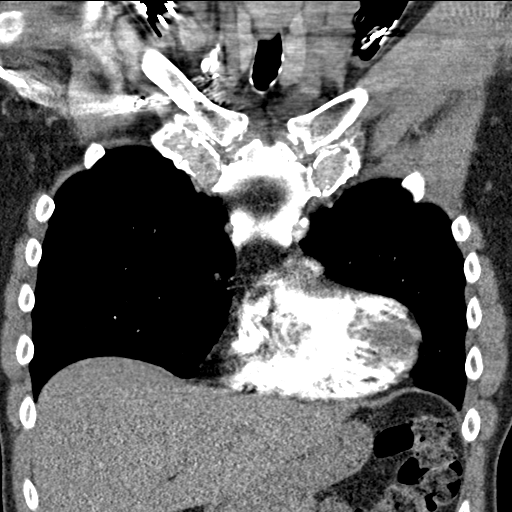
[im 56/112  soft-tissue]
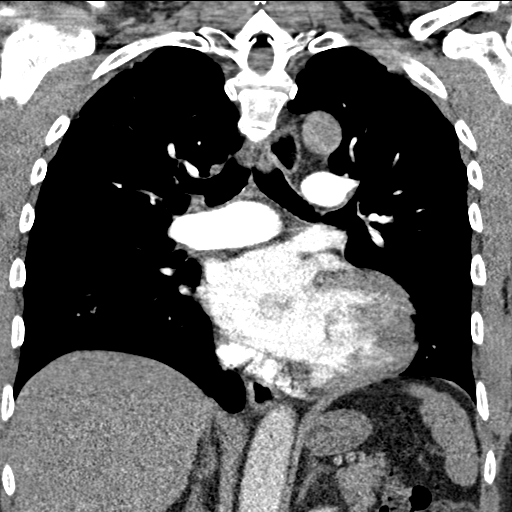
[im 84/112  soft-tissue]
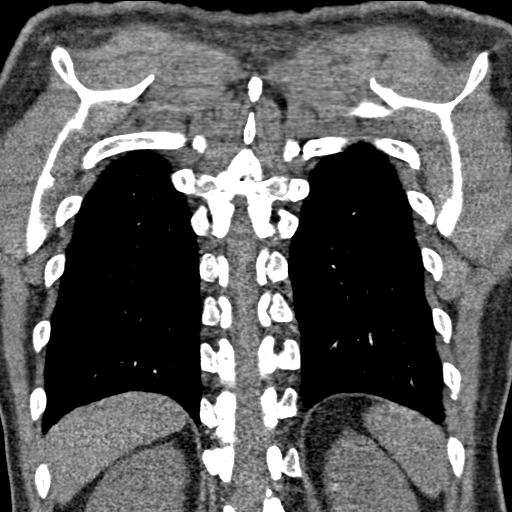

[19 of 46 positions shown; findings below may reference images not displayed]

FINDINGS: Cardiovascular: Satisfactory opacification of the pulmonary arteries
to the segmental level. No evidence of pulmonary embolism. Normal
heart size. No pericardial effusion.

Mediastinum/Nodes: No enlarged mediastinal, hilar, or axillary lymph
nodes. Thyroid gland, trachea, and esophagus demonstrate no
significant findings.

Lungs/Pleura: Lungs are clear. No pleural effusion or pneumothorax.

Upper Abdomen: No acute abnormality.

Musculoskeletal: No chest wall abnormality. No acute or significant
osseous findings.

Review of the MIP images confirms the above findings.
IMPRESSION: No definite evidence of pulmonary embolus. No abnormality seen in
the chest.

## 2019-03-07 ENCOUNTER — Encounter: Payer: Self-pay | Admitting: Family Medicine

## 2019-03-07 ENCOUNTER — Other Ambulatory Visit: Payer: Self-pay

## 2019-03-07 ENCOUNTER — Ambulatory Visit (INDEPENDENT_AMBULATORY_CARE_PROVIDER_SITE_OTHER): Payer: 59 | Admitting: Family Medicine

## 2019-03-07 VITALS — BP 118/76 | HR 70 | Temp 98.2°F | Ht 72.0 in | Wt 197.4 lb

## 2019-03-07 DIAGNOSIS — Z125 Encounter for screening for malignant neoplasm of prostate: Secondary | ICD-10-CM | POA: Diagnosis not present

## 2019-03-07 DIAGNOSIS — M109 Gout, unspecified: Secondary | ICD-10-CM | POA: Diagnosis not present

## 2019-03-07 DIAGNOSIS — I10 Essential (primary) hypertension: Secondary | ICD-10-CM

## 2019-03-07 DIAGNOSIS — Z72820 Sleep deprivation: Secondary | ICD-10-CM | POA: Diagnosis not present

## 2019-03-07 DIAGNOSIS — Z20822 Contact with and (suspected) exposure to covid-19: Secondary | ICD-10-CM

## 2019-03-07 DIAGNOSIS — R058 Other specified cough: Secondary | ICD-10-CM

## 2019-03-07 DIAGNOSIS — Z0000 Encounter for general adult medical examination without abnormal findings: Secondary | ICD-10-CM

## 2019-03-07 DIAGNOSIS — J301 Allergic rhinitis due to pollen: Secondary | ICD-10-CM

## 2019-03-07 DIAGNOSIS — R05 Cough: Secondary | ICD-10-CM

## 2019-03-07 DIAGNOSIS — Z87891 Personal history of nicotine dependence: Secondary | ICD-10-CM

## 2019-03-07 DIAGNOSIS — Z20828 Contact with and (suspected) exposure to other viral communicable diseases: Secondary | ICD-10-CM | POA: Diagnosis not present

## 2019-03-07 DIAGNOSIS — K219 Gastro-esophageal reflux disease without esophagitis: Secondary | ICD-10-CM

## 2019-03-07 DIAGNOSIS — T464X5A Adverse effect of angiotensin-converting-enzyme inhibitors, initial encounter: Secondary | ICD-10-CM

## 2019-03-07 LAB — POCT URINALYSIS DIP (PROADVANTAGE DEVICE)
Bilirubin, UA: NEGATIVE
Blood, UA: NEGATIVE
Glucose, UA: NEGATIVE mg/dL
Ketones, POC UA: NEGATIVE mg/dL
Leukocytes, UA: NEGATIVE
Nitrite, UA: NEGATIVE
Protein Ur, POC: NEGATIVE mg/dL
Specific Gravity, Urine: 1.015
Urobilinogen, Ur: 3.5
pH, UA: 6 (ref 5.0–8.0)

## 2019-03-07 MED ORDER — METOPROLOL TARTRATE 25 MG PO TABS: 12.5000 mg | ORAL_TABLET | Freq: Two times a day (BID) | ORAL | 3 refills | Status: DC

## 2019-03-07 MED ORDER — ALLOPURINOL 300 MG PO TABS: 300.0000 mg | ORAL_TABLET | Freq: Every day | ORAL | 3 refills | Status: DC

## 2019-03-07 MED ORDER — LOSARTAN POTASSIUM-HCTZ 100-25 MG PO TABS: 1.0000 | ORAL_TABLET | Freq: Every day | ORAL | 3 refills | Status: DC

## 2019-03-07 NOTE — Progress Notes (Signed)
   Subjective:    Patient ID: Matthew [name], male    DOB: [birth year], 63 y.o.   MRN: [id number]  HPI He is here for complete examination.  He continues have difficulty with sleep and has been using Tylenol PM.  He falls asleep easily but then has trouble staying asleep.  He is also started to use an over-the-counter medicine that does have a small dose of melatonin in it.  He also has generalized aches and pains and has been using meloxicam on a daily basis to help with this and finds this to be quite helpful.  He has reflux symptoms and has been using Nexium with some results of the symptoms but is continues to intermittently give him difficulty.  His allergies seem to be under good control. He continues on allopurinol and is having no trouble with that. In February of this year he did go to [place] and developed fever, chills, congestion and coughing.  He is now concerned over possibility of COVID from that. He continues on his blood pressure medications and is interested in possibly changing his dosing around. He continues on allopurinol and is having no difficulty with that. Otherwise family and social history as well as health maintenance and immunizations was reviewed.  He keeps himself quite physically active.  His work and home life are going quite well.   Review of Systems  All other systems reviewed and are negative.      Objective:   Physical Exam Alert and in no distress. Tympanic membranes and canals are normal. Pharyngeal area is normal. Neck is supple without adenopathy or thyromegaly. Cardiac exam shows a regular sinus rhythm without murmurs or gallops. Lungs are clear to auscultation. Abdominal exam shows no masses or tenderness with normal bowel sounds       Assessment & Plan:  Routine general medical examination at a health care facility - Plan: CBC with Differential/Platelet, Comprehensive metabolic panel, Lipid panel, POCT Urinalysis DIP (Proadvantage Device)  Close  Exposure to Covid-19 Virus - Plan: SAR CoV2 Serology (COVID 19)AB(IGG)IA  Screening for prostate cancer - Plan: PSA  Sleep deficient  Gout, unspecified cause, unspecified chronicity, unspecified site - Plan: Uric Acid, allopurinol (ZYLOPRIM) 300 MG tablet  Gastroesophageal reflux disease without esophagitis  Essential hypertension - Plan: losartan-hydrochlorothiazide (HYZAAR) 100-25 MG tablet, metoprolol tartrate (LOPRESSOR) 25 MG tablet  ACE-inhibitor cough  Allergic rhinitis due to pollen, unspecified seasonality  Former smoker I encouraged him to use Tylenol for aches and pains and then meloxicam separate from that. Discussed sleep hygiene with him and gave him information concerning that.  Recommended use of melatonin and for short period of time use Tylenol PM but try to get off of this and explained to him that it can interfere with good sleep architecture. Discussed the use of Nexium on double the dosing for a week to see if he can get his symptoms under control and then use it as needed after that.  He was comfortable with that. He will continue on his present allergy medications. I discussed switching his blood pressure medications around.  I will stop the amlodipine and place him on Hyzaar.  Continue on metoprolol.  He is to return here in 1 month with his blood pressure cuff for reevaluation.

## 2019-03-07 NOTE — Patient Instructions (Signed)

## 2019-03-08 ENCOUNTER — Encounter: Payer: Self-pay | Admitting: Family Medicine

## 2019-03-08 LAB — CBC WITH DIFFERENTIAL/PLATELET
Basophils Absolute: 0.1 10*3/uL (ref 0.0–0.2)
Basos: 1 %
EOS (ABSOLUTE): 0.2 10*3/uL (ref 0.0–0.4)
Eos: 3 %
Hematocrit: 41.1 % (ref 37.5–51.0)
Hemoglobin: 14.2 g/dL (ref 13.0–17.7)
Immature Grans (Abs): 0 10*3/uL (ref 0.0–0.1)
Immature Granulocytes: 0 %
Lymphocytes Absolute: 1.3 10*3/uL (ref 0.7–3.1)
Lymphs: 20 %
MCH: 33.7 pg — ABNORMAL HIGH (ref 26.6–33.0)
MCHC: 34.5 g/dL (ref 31.5–35.7)
MCV: 98 fL — ABNORMAL HIGH (ref 79–97)
Monocytes Absolute: 0.8 10*3/uL (ref 0.1–0.9)
Monocytes: 12 %
Neutrophils Absolute: 4.2 10*3/uL (ref 1.4–7.0)
Neutrophils: 64 %
Platelets: 330 10*3/uL (ref 150–450)
RBC: 4.21 x10E6/uL (ref 4.14–5.80)
RDW: 13.5 % (ref 11.6–15.4)
WBC: 6.4 10*3/uL (ref 3.4–10.8)

## 2019-03-08 LAB — LIPID PANEL
Chol/HDL Ratio: 2.6 ratio (ref 0.0–5.0)
Cholesterol, Total: 191 mg/dL (ref 100–199)
HDL: 74 mg/dL (ref >39)
LDL Calculated: 97 mg/dL (ref 0–99)
Triglycerides: 99 mg/dL (ref 0–149)
VLDL Cholesterol Cal: 20 mg/dL (ref 5–40)

## 2019-03-08 LAB — COMPREHENSIVE METABOLIC PANEL
ALT: 20 IU/L (ref 0–44)
AST: 25 IU/L (ref 0–40)
Albumin/Globulin Ratio: 2.4 — ABNORMAL HIGH (ref 1.2–2.2)
Albumin: 5.3 g/dL — ABNORMAL HIGH (ref 3.8–4.8)
Alkaline Phosphatase: 57 IU/L (ref 39–117)
BUN/Creatinine Ratio: 11 (ref 10–24)
BUN: 10 mg/dL (ref 8–27)
Bilirubin Total: 0.8 mg/dL (ref 0.0–1.2)
CO2: 22 mmol/L (ref 20–29)
Calcium: 10.3 mg/dL — ABNORMAL HIGH (ref 8.6–10.2)
Chloride: 100 mmol/L (ref 96–106)
Creatinine, Ser: 0.88 mg/dL (ref 0.76–1.27)
GFR calc Af Amer: 106 mL/min/{1.73_m2} (ref >59)
GFR calc non Af Amer: 91 mL/min/{1.73_m2} (ref >59)
Globulin, Total: 2.2 g/dL (ref 1.5–4.5)
Glucose: 99 mg/dL (ref 65–99)
Potassium: 4.6 mmol/L (ref 3.5–5.2)
Sodium: 139 mmol/L (ref 134–144)
Total Protein: 7.5 g/dL (ref 6.0–8.5)

## 2019-03-08 LAB — SAR COV2 SEROLOGY (COVID19)AB(IGG),IA: SARS-CoV-2 Ab, IgG: NEGATIVE

## 2019-03-08 LAB — URIC ACID: Uric Acid: 4.5 mg/dL (ref 3.7–8.6)

## 2019-03-11 ENCOUNTER — Other Ambulatory Visit: Payer: Self-pay | Admitting: Family Medicine

## 2019-03-15 LAB — SPECIMEN STATUS REPORT

## 2019-03-15 LAB — PSA: Prostate Specific Ag, Serum: 1.3 ng/mL (ref 0.0–4.0)

## 2019-05-07 ENCOUNTER — Telehealth: Payer: Self-pay | Admitting: *Deleted

## 2019-05-07 NOTE — Telephone Encounter (Signed)
A message was left, re: follow up visit. 

## 2019-05-22 ENCOUNTER — Other Ambulatory Visit: Payer: Self-pay

## 2019-05-22 ENCOUNTER — Other Ambulatory Visit: Payer: Self-pay | Admitting: Family Medicine

## 2019-05-22 DIAGNOSIS — Z20822 Contact with and (suspected) exposure to covid-19: Secondary | ICD-10-CM

## 2019-05-22 NOTE — Telephone Encounter (Signed)
Is this okay to refill? 

## 2019-05-26 LAB — NOVEL CORONAVIRUS, NAA: SARS-CoV-2, NAA: NOT DETECTED

## 2019-06-01 ENCOUNTER — Other Ambulatory Visit: Payer: Self-pay | Admitting: Family Medicine

## 2019-06-06 ENCOUNTER — Encounter: Payer: Self-pay | Admitting: *Deleted

## 2019-06-07 ENCOUNTER — Encounter: Payer: Self-pay | Admitting: Cardiology

## 2019-06-07 ENCOUNTER — Telehealth (INDEPENDENT_AMBULATORY_CARE_PROVIDER_SITE_OTHER): Payer: 59 | Admitting: Cardiology

## 2019-06-07 VITALS — BP 116/61 | HR 66 | Ht 72.0 in | Wt 185.5 lb

## 2019-06-07 DIAGNOSIS — I493 Ventricular premature depolarization: Secondary | ICD-10-CM

## 2019-06-07 DIAGNOSIS — I10 Essential (primary) hypertension: Secondary | ICD-10-CM

## 2019-06-07 DIAGNOSIS — R0602 Shortness of breath: Secondary | ICD-10-CM

## 2019-06-07 MED ORDER — LOSARTAN POTASSIUM-HCTZ 50-12.5 MG PO TABS: 1.0000 | ORAL_TABLET | Freq: Every day | ORAL | 3 refills | Status: DC

## 2019-06-07 NOTE — Progress Notes (Signed)
Virtual Visit via Video Note   This visit type was conducted due to national recommendations for restrictions regarding the COVID-19 Pandemic (e.g. social distancing) in an effort to limit this patient's exposure and mitigate transmission in our community.  Due to his co-morbid illnesses, this patient is at least at moderate risk for complications without adequate follow up.  This format is felt to be most appropriate for this patient at this time.  All issues noted in this document were discussed and addressed.  A limited physical exam was performed with this format.  Please refer to the patient's chart for his consent to telehealth for [facility].   Date:  06/07/2019   ID:  Matthew [name], DOB 1956-04-08, MRN [id number]  Patient Location:Home Provider Location: Home  PCP:  Denita Lung, MD  Cardiologist:  Dr Stanford Breed  Evaluation Performed:  Follow-Up Visit  Chief Complaint:  FU PVCs and dyspnea  History of Present Illness:    FU PVCs and dyspnea.Patient had left knee replacement in January 2019.  CTA was performed related to dyspnea and showed no pulmonary embolus.   Echocardiogram February 2019 showed normal LV function, mild diastolic dysfunction and mild left atrial enlargement.  Patient also noted to have PVCs on ECG.  Patient seen March 2019 with complaint of episode of tachycardia and beta-blocker added.  Since he was last seen patient has had some dizziness with standing and with that has mild chest tightness.  He recently discontinued his amlodipine with some improvement though symptoms persist.  Otherwise denies dyspnea on exertion, exertional chest pain or syncope.  He does state that his blood pressure runs low.  The patient does not have symptoms concerning for COVID-19 infection (fever, chills, cough, or new shortness of breath).    Past Medical History:  Diagnosis Date  . Allergy    RHINITIS  . Anemia   . Arthritis   . Colonic polyp 09/2006   colonoscopy   . GERD (gastroesophageal reflux disease)   . Gout   . Hypertension   . SOB (shortness of breath)    per pt, may be coming from new medication   Past Surgical History:  Procedure Laterality Date  . COLONOSCOPY  09/2006  . KNEE ARTHROSCOPY Left 2011  . [place] SURGERY  1990  . TOTAL KNEE ARTHROPLASTY Left 11/06/2017   Procedure: TOTAL KNEE ARTHROPLASTY;  Surgeon: Dorna Leitz, MD;  Location: [facility];  Service: Orthopedics;  Laterality: Left;     Current Meds  Medication Sig  . allopurinol (ZYLOPRIM) 300 MG tablet Take 1 tablet (300 mg total) by mouth daily.  [name] aspirin EC 81 MG tablet Take 81 mg by mouth daily.  . cholecalciferol (VITAMIN D) 1000 units tablet Take 1,000 Units by mouth daily.  . Ferrous Sulfate (IRON) 325 (65 FE) MG TABS Take 325 mg by mouth daily.   . fexofenadine (ALLEGRA) 180 MG tablet Take 180 mg by mouth at bedtime.   . fish oil-omega-3 fatty acids 1000 MG capsule Take 2 g by mouth daily.   [name] losartan-hydrochlorothiazide (HYZAAR) 100-25 MG tablet Take 1 tablet by mouth daily.  . meloxicam (MOBIC) 15 MG tablet TAKE 1 TABLET BY MOUTH EVERY DAY  . metoprolol tartrate (LOPRESSOR) 25 MG tablet Take 0.5 tablets (12.5 mg total) by mouth 2 (two) times daily.  . Multiple Vitamin (MULTIVITAMIN) tablet Take 1 tablet by mouth daily.    . valACYclovir (VALTREX) 1000 MG tablet TAKE 1 TABLET BY MOUTH EVERY DAY  . Vitamin  D, Ergocalciferol, (DRISDOL) 1.25 MG (50000 UT) CAPS capsule TAKE 1 CAPSULE (50,000 UNITS TOTAL) BY MOUTH EVERY 7 (SEVEN) DAYS.  . [DISCONTINUED] OVER THE COUNTER MEDICATION Take 3 pills at bedtime   Current Facility-Administered Medications for the 06/07/19 encounter (Telemedicine) with Lelon Perla, MD  Medication  . 0.9 %  sodium chloride infusion     Allergies:   Asa [aspirin] and Demerol   Social History   Tobacco Use  . Smoking status: Former Smoker    Types: Cigars  . Smokeless tobacco: Never Used  . Tobacco comment: occasional cigar   Substance Use Topics  . Alcohol use: Yes    Alcohol/week: 3.0 standard drinks    Types: 3 Standard drinks or equivalent per week    Comment: occ 2-3 beers  . Drug use: No     Family Hx: The patient's family history includes Stroke in his father. There is no history of Colon cancer or Rectal cancer.  ROS:   Please see the history of present illness.    No Fever, chills  or productive cough All other systems reviewed and are negative.   Recent Labs: 03/07/2019: ALT 20; BUN 10; Creatinine, Ser 0.88; Hemoglobin 14.2; Platelets 330; Potassium 4.6; Sodium 139   Recent Lipid Panel Lab Results  Component Value Date/Time   CHOL 191 03/07/2019 11:00 AM   TRIG 99 03/07/2019 11:00 AM   HDL 74 03/07/2019 11:00 AM   CHOLHDL 2.6 03/07/2019 11:00 AM   CHOLHDL 2.9 11/02/2017 02:42 PM   LDLCALC 97 03/07/2019 11:00 AM   LDLCALC 117 (H) 11/02/2017 02:42 PM    Wt Readings from Last 3 Encounters:  06/07/19 185 lb 8 oz (84.1 kg)  03/07/19 197 lb 6.4 oz (89.5 kg)  12/17/18 205 lb 12.8 oz (93.4 kg)     Objective:    Vital Signs:  BP 116/61   Pulse 66   Ht 6' (1.829 m)   Wt 185 lb 8 oz (84.1 kg)   BMI 25.16 kg/m    VITAL SIGNS:  reviewed NAD Answers questions appropriately Normal affect Remainder of physical examination not performed (telehealth visit; coronavirus pandemic)  ASSESSMENT & PLAN:    1. PVCs-no significant palpitations; continue beta blocker 2. Hypertension-BP has been running low.  Some orthostatic symptoms.  Decrease Hyzaar to 50/12.5 mg daily and follow blood pressure.  Adjust regimen based on follow-up results. 3. Dyspnea-improved compared to previous; no further evaluation 4. Chest tightness-occasional minimal chest tightness with standing suddenly and associated with his dizziness.  However he walks and is very physically active and does not have exertional chest pain.  We will consider a functional study in the future if his symptoms worsen.  COVID-19 Education:  The importance of social distancing was discussed today.  Time:   Today, I have spent 17 minutes with the patient with telehealth technology discussing the above problems.     Medication Adjustments/Labs and Tests Ordered: Current medicines are reviewed at length with the patient today.  Concerns regarding medicines are outlined above.   Tests Ordered: No orders of the defined types were placed in this encounter.   Medication Changes: No orders of the defined types were placed in this encounter.   Follow Up:  In Person in 3 month(s)  Signed, Kirk Ruths, MD  06/07/2019 8:33 AM    [place]

## 2019-06-07 NOTE — Patient Instructions (Signed)
Medication Instructions: DECREASE LOSARTAN/HCT TO 50-12.5 MG ONCE DAILY= 1/2 OF THE 100-25 MG TABLET ONCE DAILY   If you need a refill on your cardiac medications before your next appointment, please call your pharmacy.   Lab work: If you have labs (blood work) drawn today and your tests are completely normal, you will receive your results only by: [name] MyChart Message (if you have MyChart) OR . A paper copy in the mail If you have any lab test that is abnormal or we need to change your treatment, we will call you to review the results.  Follow-Up: At [facility], you and your health needs are our priority.  As part of our continuing mission to provide you with exceptional heart care, we have created designated Provider Care Teams.  These Care Teams include your primary Cardiologist (physician) and Advanced Practice Providers (APPs -  Physician Assistants and Nurse Practitioners) who all work together to provide you with the care you need, when you need it. Your physician recommends that you schedule a follow-up appointment in: [place]

## 2019-06-22 ENCOUNTER — Telehealth: Payer: Self-pay | Admitting: Cardiology

## 2019-06-22 NOTE — Telephone Encounter (Signed)
    Paged the on-call answering service regarding patient's elevated heart rate in the 160s.  Spoke with patient who states that his heart rate has been sustaining in the 160s consistently beginning earlier today.  His daughter is receiving treatments and they have been RN who was there who took his pulse and confirmed.  He states this will typically happen to him every couple months and is initiated by exertion.  He denies chest pain, shortness of breath, orthopnea or LE swelling.  He was recently seen by Dr. Stanford Breed 06/07/2019 for similar symptoms.  During that visit, it was thought that low normal BP was contributing.  Given this, his Hyzaar was decreased.  I will have him increase his metoprolol to 25 mg twice daily just for today.  He is to monitor his BP and symptoms closely.  If persistent, he is to come to the ED for further evaluation however will try to avoid this at all cost.  Instructed him to call the office on Monday morning for possible sooner appointment.  Would consider placing 30-day monitor to assess for arrhythmias along with a possible functional study given hx of chest tightness.   Kathyrn Drown NP-C [place] Pager: [phone]

## 2019-06-24 ENCOUNTER — Other Ambulatory Visit: Payer: Self-pay | Admitting: *Deleted

## 2019-06-24 DIAGNOSIS — R072 Precordial pain: Secondary | ICD-10-CM

## 2019-06-24 NOTE — Telephone Encounter (Signed)
If patient's heart rate increases to 160 in the future would like him to come to the office for ECG while elevated. Kirk Ruths

## 2019-06-25 NOTE — Telephone Encounter (Signed)
Discussed woth patient via my chart

## 2019-07-01 ENCOUNTER — Telehealth (HOSPITAL_COMMUNITY): Payer: Self-pay | Admitting: Emergency Medicine

## 2019-07-01 NOTE — Telephone Encounter (Signed)
Reaching out to patient to offer assistance regarding upcoming cardiac imaging study; pt verbalizes understanding of appt date/time, parking situation and where to check in, pre-test NPO status and medications ordered, and verified current allergies; name and call back number provided for further questions should they arise Bridgitt Raggio RN Navigator Cardiac Imaging [facility] [phone] office [phone] cell 

## 2019-07-02 ENCOUNTER — Ambulatory Visit (HOSPITAL_COMMUNITY)
Admission: RE | Admit: 2019-07-02 | Discharge: 2019-07-02 | Disposition: A | Discharge status: Home / Self Care | Payer: 59 | Source: Ambulatory Visit | Attending: Cardiology | Admitting: Cardiology

## 2019-07-02 ENCOUNTER — Other Ambulatory Visit: Payer: Self-pay

## 2019-07-02 ENCOUNTER — Ambulatory Visit (HOSPITAL_COMMUNITY): Admission: RE | Admit: 2019-07-02 | Payer: 59 | Source: Ambulatory Visit

## 2019-07-02 ENCOUNTER — Encounter: Payer: Self-pay | Admitting: Family Medicine

## 2019-07-02 DIAGNOSIS — R072 Precordial pain: Secondary | ICD-10-CM

## 2019-07-02 LAB — POCT I-STAT CREATININE: Creatinine, Ser: 0.8 mg/dL (ref 0.61–1.24)

## 2019-07-02 MED ORDER — NITROGLYCERIN 0.4 MG SL SUBL
SUBLINGUAL_TABLET | SUBLINGUAL | Status: AC
  Filled 2019-07-02: qty 2

## 2019-07-02 MED ORDER — NITROGLYCERIN 0.4 MG SL SUBL
0.8000 mg | SUBLINGUAL_TABLET | Freq: Once | SUBLINGUAL | Status: AC
  Administered 2019-07-02: 0.8 mg via SUBLINGUAL

## 2019-07-02 MED ORDER — IOHEXOL 350 MG/ML SOLN
100.0000 mL | Freq: Once | INTRAVENOUS | Status: AC | PRN
  Administered 2019-07-02: 100 mL via INTRAVENOUS

## 2019-07-02 NOTE — Progress Notes (Signed)
Ct complete. Patient denies any complaints. Offered patient snack and beverage.

## 2019-07-03 DIAGNOSIS — R931 Abnormal findings on diagnostic imaging of heart and coronary circulation: Secondary | ICD-10-CM

## 2019-07-03 MED ORDER — ROSUVASTATIN CALCIUM 20 MG PO TABS: 20.0000 mg | ORAL_TABLET | Freq: Every day | ORAL | 3 refills | Status: DC

## 2019-07-03 NOTE — Telephone Encounter (Signed)
Ok to travel; schedule 24 hour holter  Kirk Ruths

## 2019-07-11 LAB — HEPATIC FUNCTION PANEL
ALT: 23 IU/L (ref 0–44)
AST: 31 IU/L (ref 0–40)
Albumin: 4.7 g/dL (ref 3.8–4.8)
Alkaline Phosphatase: 49 IU/L (ref 39–117)
Bilirubin Total: 0.7 mg/dL (ref 0.0–1.2)
Bilirubin, Direct: 0.23 mg/dL (ref 0.00–0.40)
Total Protein: 6.5 g/dL (ref 6.0–8.5)

## 2019-07-11 LAB — LIPID PANEL
Chol/HDL Ratio: 1.6 ratio (ref 0.0–5.0)
Cholesterol, Total: 121 mg/dL (ref 100–199)
HDL: 76 mg/dL (ref >39)
LDL Chol Calc (NIH): 33 mg/dL (ref 0–99)
Triglycerides: 48 mg/dL (ref 0–149)
VLDL Cholesterol Cal: 12 mg/dL (ref 5–40)

## 2019-08-07 ENCOUNTER — Encounter: Payer: Self-pay | Admitting: Family Medicine

## 2019-08-07 ENCOUNTER — Ambulatory Visit (INDEPENDENT_AMBULATORY_CARE_PROVIDER_SITE_OTHER): Payer: 59 | Admitting: Family Medicine

## 2019-08-07 ENCOUNTER — Other Ambulatory Visit: Payer: Self-pay

## 2019-08-07 VITALS — BP 130/80 | HR 66 | Ht 72.0 in | Wt 183.0 lb

## 2019-08-07 DIAGNOSIS — M999 Biomechanical lesion, unspecified: Secondary | ICD-10-CM

## 2019-08-07 DIAGNOSIS — M542 Cervicalgia: Secondary | ICD-10-CM

## 2019-08-07 MED ORDER — TIZANIDINE HCL 4 MG PO TABS: 4.0000 mg | ORAL_TABLET | Freq: Every day | ORAL | 0 refills | Status: DC

## 2019-08-07 NOTE — Assessment & Plan Note (Signed)
Decision today to treat with OMT was based on Physical Exam  After verbal consent patient was treated with HVLA, ME, FPR techniques in cervical, thoracic, rib areas  Patient tolerated the procedure well with improvement in symptoms  Patient given exercises, stretches and lifestyle modifications  See medications in patient instructions if given  Patient will follow up in 4-8 weeks 

## 2019-08-07 NOTE — Patient Instructions (Signed)
OOFOS or [place] recovery sandals Zanaflex at night Scapular exercises 3x a week See me again in 3 weeks

## 2019-08-07 NOTE — Progress Notes (Signed)
[name] [address] [place], [place] [address] Phone: [phone] Subjective:   Matthew [name], am serving as a scribe for Dr. Hulan Saas.   CC: neck pain   [id number]    11/08/18: Right groin pain.  Discussed with patient at great length about icing regimen and home exercise.  Discussed topical anti-inflammatories.  Differential includes a sports hernia but appears on ultrasound that there is an abductor chronic straining noted.  Patient is going to try and home exercise, thigh compression, which activities to do which wants to avoid.  Patient will avoid high impact.  Follow-up again in 4 to 6 weeks  Update- 08/07/19: Matthew [name] is a 63 y.o. male coming in with complaint of neck pain. Just drove across the country and is feeling tight in the neck musculature.   Groin is feeling better. Is walking 20,000 steps a day. Has developed pain over IT band. Also wants more zanaflex.  Also would like a referral to podiatry due to nail issues.      Past Medical History:  Diagnosis Date   Allergy    RHINITIS   Anemia    Arthritis    Colonic polyp 09/2006   colonoscopy   GERD (gastroesophageal reflux disease)    Gout    Hypertension    SOB (shortness of breath)    per pt, may be coming from new medication   Past Surgical History:  Procedure Laterality Date   COLONOSCOPY  09/2006   KNEE ARTHROSCOPY Left 2011   [place]   TOTAL KNEE ARTHROPLASTY Left 11/06/2017   Procedure: TOTAL KNEE ARTHROPLASTY;  Surgeon: Dorna Leitz, MD;  Location: [facility];  Service: Orthopedics;  Laterality: Left;   Social History   Socioeconomic History   Marital status: Married    Spouse name: Not on file   Number of children: 1   Years of education: Not on file   Highest education level: Not on file  Occupational History   Not on file  Social Needs   Financial resource strain: Not on file   Food insecurity    Worry: Not  on file    Inability: Not on file   Transportation needs    Medical: Not on file    Non-medical: Not on file  Tobacco Use   Smoking status: Former Smoker    Types: Cigars   Smokeless tobacco: Never Used   Tobacco comment: occasional cigar  Substance and Sexual Activity   Alcohol use: Yes    Alcohol/week: 3.0 standard drinks    Types: 3 Standard drinks or equivalent per week    Comment: occ 2-3 beers   Drug use: [name]   Sexual activity: Yes  Lifestyle   Physical activity    Days per week: Not on file    Minutes per session: Not on file   Stress: Not on file  Relationships   Social connections    Talks on phone: Not on file    Gets together: Not on file    Attends religious service: Not on file    Active member of club or organization: Not on file    Attends meetings of clubs or organizations: Not on file    Relationship status: Not on file  Other Topics Concern   Not on file  Social History Narrative   Not on file   Allergies  Allergen Reactions   Asa [Aspirin] Rash    High dosage ASA causes  break out   Demerol Itching   Family History  Problem Relation Age of Onset   Stroke Father    Colon cancer Neg Hx    Rectal cancer Neg Hx       Current Outpatient Medications (Cardiovascular):    losartan-hydrochlorothiazide (HYZAAR) 50-12.5 MG tablet, Take 1 tablet by mouth daily.   metoprolol tartrate (LOPRESSOR) 25 MG tablet, Take 0.5 tablets (12.5 mg total) by mouth 2 (two) times daily.   rosuvastatin (CRESTOR) 20 MG tablet, Take 1 tablet (20 mg total) by mouth daily.   Current Outpatient Medications (Respiratory):    fexofenadine (ALLEGRA) 180 MG tablet, Take 180 mg by mouth at bedtime.    Current Outpatient Medications (Analgesics):    allopurinol (ZYLOPRIM) 300 MG tablet, Take 1 tablet (300 mg total) by mouth daily.   aspirin EC 81 MG tablet, Take 81 mg by mouth daily.   meloxicam (MOBIC) 15 MG tablet, TAKE 1 TABLET BY MOUTH EVERY DAY  (Patient not taking: Reported on 07/02/2019)   Current Outpatient Medications (Hematological):    Ferrous Sulfate (IRON) 325 (65 FE) MG TABS, Take 325 mg by mouth daily.    Current Outpatient Medications (Other):    cholecalciferol (VITAMIN D) 1000 units tablet, Take 1,000 Units by mouth daily.   fish oil-omega-3 fatty acids 1000 MG capsule, Take 2 g by mouth daily.    Multiple Vitamin (MULTIVITAMIN) tablet, Take 1 tablet by mouth daily.     tiZANidine (ZANAFLEX) 4 MG tablet, Take 1 tablet (4 mg total) by mouth at bedtime.   valACYclovir (VALTREX) 1000 MG tablet, TAKE 1 TABLET BY MOUTH EVERY DAY   Vitamin D, Ergocalciferol, (DRISDOL) 1.25 MG (50000 UT) CAPS capsule, TAKE 1 CAPSULE (50,000 UNITS TOTAL) BY MOUTH EVERY 7 (SEVEN) DAYS.  Current Facility-Administered Medications (Other):    0.9 %  sodium chloride infusion    Past medical history, social, surgical and family history all reviewed in electronic medical record.  [name]   Review of Systems:  [name], visual changes, nausea, vomiting, diarrhea, constipation, dizziness, abdominal pain, skin rash, fevers, chills, night sweats, weight loss, swollen lymph nodes, body aches, joint swelling, chest pain, shortness of breath, mood changes.  Positive muscle aches  Objective  Blood pressure 130/80, pulse 66, height 6' (1.829 m), weight 183 lb (83 kg), SpO2 99 %.    General: [name], dressed appropriately.  HEENT: Pupils equal, extraocular movements intact  Respiratory: Patient's speak in full sentences and does not appear short of breath  Cardiovascular: [name], non tender, [name]  Skin: Warm dry intact with [name]  Abdomen: Soft nontender  Neuro: Cranial nerves II through XII are intact, neurovascularly intact in all  extremities with 2+ DTRs and 2+ pulses.  Lymph: [name] lymphadenopathy of posterior or anterior cervical chain or axillae bilaterally.  Gait normal with good balance and coordination.  MSK:  tender with full range of motion and good stability and symmetric strength and tone of shoulders, elbows, wrist, hip, knee and ankles bilaterally.  Neck exam does show that patient does have some loss of lordosis.  Some weakness with some mild scapular dyskinesis between the shoulder blades right greater than left.  Negative Spurling's.  Osteopathic findings C2 flexed rotated and side bent right C6 flexed rotated and side bent left T3 extended rotated and side  bent right inhaled third rib T7 extended rotated and side bent left  [id number]; 15 additional minutes spent for Therapeutic exercises as stated in above notes.  This included exercises focusing on stretching, strengthening, with significant focus on eccentric aspects.   Long term goals include an improvement in range of motion, strength, endurance as well as avoiding reinjury. Patient's frequency would include in 1-2 times a day, 3-5 times a week for a duration of 6-12 weeks.Exercises that included:  Basic scapular stabilization to include adduction and depression of scapula Scaption, focusing on proper movement and good control Internal and External rotation utilizing a theraband, with elbow tucked at side entire time Rows with theraband    Proper technique shown and discussed handout in great detail with ATC.  All questions were discussed and answered.      Impression and Recommendations:     This case required medical decision making of moderate complexity. The above documentation has been reviewed and is accurate and complete Lyndal Pulley, DO       Note: This dictation was prepared with Dragon dictation along with smaller phrase technology. Any transcriptional errors that result from this process are unintentional.

## 2019-08-07 NOTE — Assessment & Plan Note (Signed)
More muscle imbalances and is had this previously.  I am concerned for some mild underlying arthritis and can also be contributing.  Discussed posture and ergonomics, which activities to do which wants to avoid.  Discussed changing position when he is driving long distances.  Likely exacerbated some of the pain.  Follow-up again in 4 to 8 weeks

## 2019-08-08 MED ORDER — ZOLPIDEM TARTRATE 5 MG PO TABS: 5.0000 mg | ORAL_TABLET | Freq: Every evening | ORAL | 1 refills | Status: DC | PRN

## 2019-08-08 MED ORDER — TRAZODONE HCL 50 MG PO TABS: 25.0000 mg | ORAL_TABLET | Freq: Every evening | ORAL | 3 refills | Status: DC | PRN

## 2019-08-08 NOTE — Addendum Note (Signed)
Addended by: Lyndal Pulley on: 08/08/2019 04:05 PM   Modules accepted: Orders

## 2019-08-28 ENCOUNTER — Encounter: Payer: Self-pay | Admitting: Family Medicine

## 2019-08-28 ENCOUNTER — Ambulatory Visit (INDEPENDENT_AMBULATORY_CARE_PROVIDER_SITE_OTHER): Payer: 59 | Admitting: Family Medicine

## 2019-08-28 VITALS — BP 148/80 | HR 110 | Ht 72.0 in | Wt 181.0 lb

## 2019-08-28 DIAGNOSIS — M999 Biomechanical lesion, unspecified: Secondary | ICD-10-CM | POA: Diagnosis not present

## 2019-08-28 DIAGNOSIS — R29898 Other symptoms and signs involving the musculoskeletal system: Secondary | ICD-10-CM

## 2019-08-28 MED ORDER — TIZANIDINE HCL 4 MG PO CAPS: ORAL_CAPSULE | ORAL | 0 refills | Status: DC

## 2019-08-28 MED ORDER — MONTELUKAST SODIUM 10 MG PO TABS: 10.0000 mg | ORAL_TABLET | Freq: Every day | ORAL | 3 refills | Status: DC

## 2019-08-28 NOTE — Patient Instructions (Addendum)
Good to see you Singular 10 mg Flonase daily for 2 weeks Zanaflex for the next 3 nights See me again in 5 weeks

## 2019-08-28 NOTE — Assessment & Plan Note (Signed)
Neck tightness, discussed posture and ergonomics, discussed with her considering which wants to avoid.  Seems to be more muscular than true arthritic.  Worsening of symptoms we will consider the possibility of further advanced imaging.  Follow-up again in 4 to 8 weeks

## 2019-08-28 NOTE — Progress Notes (Signed)
[name] [address] [place], [place] [address] Phone: [phone] Subjective:   I Matthew [name] am serving as a Education administrator for Dr. Hulan Saas.  I'm seeing this patient by the request  of:    CC: Neck pain follow-up  [id number]  Matthew [name] is a 63 y.o. male coming in with complaint of back pain. Patient states he is doing better but still has some bad days.   Overall though does think he is making some improvement.  Patient has not been taking any true pain medicines.  Has noticed less more increasing allergies but did have a positive Covid 3 weeks ago.  Feels like resolved since then.     Past Medical History:  Diagnosis Date  . Allergy    RHINITIS  . Anemia   . Arthritis   . Colonic polyp 09/2006   colonoscopy  . GERD (gastroesophageal reflux disease)   . Gout   . Hypertension   . SOB (shortness of breath)    per pt, may be coming from new medication   Past Surgical History:  Procedure Laterality Date  . COLONOSCOPY  09/2006  . KNEE ARTHROSCOPY Left 2011  . [place] SURGERY  1990  . TOTAL KNEE ARTHROPLASTY Left 11/06/2017   Procedure: TOTAL KNEE ARTHROPLASTY;  Surgeon: Dorna Leitz, MD;  Location: [facility];  Service: Orthopedics;  Laterality: Left;   Social History   Socioeconomic History  . Marital status: Married    Spouse name: Not on file  . Number of children: 1  . Years of education: Not on file  . Highest education level: Not on file  Occupational History  . Not on file  Social Needs  . Financial resource strain: Not on file  . Food insecurity    Worry: Not on file    Inability: Not on file  . Transportation needs    Medical: Not on file    Non-medical: Not on file  Tobacco Use  . Smoking status: Former Smoker    Types: Cigars  . Smokeless tobacco: Never Used  . Tobacco comment: occasional cigar  Substance and Sexual Activity  . Alcohol use: Yes    Alcohol/week: 3.0 standard drinks    Types: 3 Standard  drinks or equivalent per week    Comment: occ 2-3 beers  . Drug use: No  . Sexual activity: Yes  Lifestyle  . Physical activity    Days per week: Not on file    Minutes per session: Not on file  . Stress: Not on file  Relationships  . Social Herbalist on phone: Not on file    Gets together: Not on file    Attends religious service: Not on file    Active member of club or organization: Not on file    Attends meetings of clubs or organizations: Not on file    Relationship status: Not on file  Other Topics Concern  . Not on file  Social History Narrative  . Not on file   Allergies  Allergen Reactions  . Asa [Aspirin] Rash    High dosage ASA causes break out  . Demerol Itching   Family History  Problem Relation Age of Onset  . Stroke Father   . Colon cancer Neg Hx   . Rectal cancer Neg Hx       Current Outpatient Medications (Cardiovascular):  .  losartan-hydrochlorothiazide (HYZAAR) 50-12.5 MG tablet, Take 1 tablet by mouth daily. [name]  metoprolol tartrate (LOPRESSOR) 25 MG tablet, Take 0.5 tablets (12.5 mg total) by mouth 2 (two) times daily. .  rosuvastatin (CRESTOR) 20 MG tablet, Take 1 tablet (20 mg total) by mouth daily.   Current Outpatient Medications (Respiratory):  .  fexofenadine (ALLEGRA) 180 MG tablet, Take 180 mg by mouth at bedtime.  .  montelukast (SINGULAIR) 10 MG tablet, Take 1 tablet (10 mg total) by mouth at bedtime.   Current Outpatient Medications (Analgesics):  .  allopurinol (ZYLOPRIM) 300 MG tablet, Take 1 tablet (300 mg total) by mouth daily. [name]  aspirin EC 81 MG tablet, Take 81 mg by mouth daily. .  meloxicam (MOBIC) 15 MG tablet, TAKE 1 TABLET BY MOUTH EVERY DAY   Current Outpatient Medications (Hematological):  [name]  Ferrous Sulfate (IRON) 325 (65 FE) MG TABS, Take 325 mg by mouth daily.    Current Outpatient Medications (Other):  .  cholecalciferol (VITAMIN D) 1000 units tablet, Take 1,000 Units by mouth daily. .  fish  oil-omega-3 fatty acids 1000 MG capsule, Take 2 g by mouth daily.  .  Multiple Vitamin (MULTIVITAMIN) tablet, Take 1 tablet by mouth daily.   [name]  tiZANidine (ZANAFLEX) 4 MG tablet, Take 1 tablet (4 mg total) by mouth at bedtime. .  traZODone (DESYREL) 50 MG tablet, Take 0.5-1 tablets (25-50 mg total) by mouth at bedtime as needed for sleep. .  valACYclovir (VALTREX) 1000 MG tablet, TAKE 1 TABLET BY MOUTH EVERY DAY .  Vitamin D, Ergocalciferol, (DRISDOL) 1.25 MG (50000 UT) CAPS capsule, TAKE 1 CAPSULE (50,000 UNITS TOTAL) BY MOUTH EVERY 7 (SEVEN) DAYS. [name]  zolpidem (AMBIEN) 5 MG tablet, Take 1 tablet (5 mg total) by mouth at bedtime as needed for sleep. [name]  tiZANidine (ZANAFLEX) 4 MG capsule, 1 tablet at night  Current Facility-Administered Medications (Other):  .  0.9 %  sodium chloride infusion    Past medical history, social, surgical and family history all reviewed in electronic medical record.  No pertanent information unless stated regarding to the chief complaint.   Review of Systems:  No headache, visual changes, nausea, vomiting, diarrhea, constipation, dizziness, abdominal pain, skin rash, fevers, chills, night sweats, weight loss, swollen lymph nodes, body aches, joint swelling,  chest pain, shortness of breath, mood changes.  Positive muscle aches  Objective  Blood pressure (!) 148/80, pulse (!) 110, height 6' (1.829 m), weight 181 lb (82.1 kg), SpO2 98 %.    General: No apparent distress alert and oriented x3 mood and affect normal, dressed appropriately.  HEENT: Pupils equal, extraocular movements intact  Respiratory: Patient's speak in full sentences and does not appear short of breath  Cardiovascular: Trace lower extremity edema, non tender, no erythema  Skin: Warm dry intact with no signs of infection or rash on extremities or on axial skeleton.  Abdomen: Soft nontender  Neuro: Cranial nerves II through XII are intact, neurovascularly intact in all extremities with 2+ DTRs  and 2+ pulses.  Lymph: No lymphadenopathy of posterior or anterior cervical chain or axillae bilaterally.  Gait normal with good balance and coordination.  MSK:  Non tender with full range of motion and good stability and symmetric strength and tone of shoulders, elbows, wrist, hip, knee and ankles bilaterally.  Neck exam shows the patient does have some loss of lordosis and some decreased range of motion in both sidebending as well as extension of 5 degrees.  Tightness of the trapezius bilaterally.  Negative Spurling's.  Osteopathic findings  C2 flexed rotated and side  bent right C4 flexed rotated and side bent left C6 flexed rotated and side bent left T3 extended rotated and side bent right inhaled third rib     Impression and Recommendations:     This case required medical decision making of moderate complexity. The above documentation has been reviewed and is accurate and complete Lyndal Pulley, DO       Note: This dictation was prepared with Dragon dictation along with smaller phrase technology. Any transcriptional errors that result from this process are unintentional.

## 2019-08-28 NOTE — Assessment & Plan Note (Signed)
Decision today to treat with OMT was based on Physical Exam  After verbal consent patient was treated with HVLA, ME, FPR techniques in cervical, thoracic, rib areas  Patient tolerated the procedure well with improvement in symptoms  Patient given exercises, stretches and lifestyle modifications  See medications in patient instructions if given  Patient will follow up in 4-8 weeks 

## 2019-08-30 ENCOUNTER — Other Ambulatory Visit: Payer: Self-pay | Admitting: Family Medicine

## 2019-09-09 NOTE — Progress Notes (Signed)
HPI: FUPVCs and dyspnea.Patient had left knee replacement in January2019. CTA was performed related to dyspneaand showed no pulmonary embolus.Echocardiogram February 2019 showed normal LV function, mild diastolic dysfunction and mild left atrial enlargement. Patient also noted to have PVCs on ECG. Patient seen March 2019 with complaint of episode of tachycardia and beta-blocker added. Cardiac CTA 9/20-calcium score 43; minimal plaque RCA; mildly dilated PA. Since he was last seen  there is no dyspnea on exertion, orthopnea, PND, pedal edema, chest pain or syncope.  He has had 2 separate episodes of palpitations with heart rate at 170 for 15 minutes by his report.  He took an extra metoprolol with resolution.  Current Outpatient Medications  Medication Sig Dispense Refill  . allopurinol (ZYLOPRIM) 300 MG tablet Take 1 tablet (300 mg total) by mouth daily. 90 tablet 3  . aspirin EC 81 MG tablet Take 81 mg by mouth daily.    . cholecalciferol (VITAMIN D) 1000 units tablet Take 1,000 Units by mouth daily.    . Ferrous Sulfate (IRON) 325 (65 FE) MG TABS Take 325 mg by mouth daily.     . fexofenadine (ALLEGRA) 180 MG tablet Take 180 mg by mouth at bedtime.     . fish oil-omega-3 fatty acids 1000 MG capsule Take 2 g by mouth daily.     [name] losartan-hydrochlorothiazide (HYZAAR) 50-12.5 MG tablet Take 1 tablet by mouth daily. 90 tablet 3  . meloxicam (MOBIC) 15 MG tablet TAKE 1 TABLET BY MOUTH EVERY DAY 30 tablet 0  . metoprolol tartrate (LOPRESSOR) 25 MG tablet Take 0.5 tablets (12.5 mg total) by mouth 2 (two) times daily. 90 tablet 3  . Multiple Vitamin (MULTIVITAMIN) tablet Take 1 tablet by mouth daily.      . rosuvastatin (CRESTOR) 20 MG tablet Take 1 tablet (20 mg total) by mouth daily. 90 tablet 3  . tiZANidine (ZANAFLEX) 4 MG capsule 1 tablet at night 30 capsule 0  . traZODone (DESYREL) 50 MG tablet Take 0.5-1 tablets (25-50 mg total) by mouth at bedtime as needed for sleep. 30 tablet 3   . valACYclovir (VALTREX) 1000 MG tablet TAKE 1 TABLET BY MOUTH EVERY DAY 30 tablet 5  . zolpidem (AMBIEN) 5 MG tablet Take 1 tablet (5 mg total) by mouth at bedtime as needed for sleep. 15 tablet 1   Current Facility-Administered Medications  Medication Dose Route Frequency Provider Last Rate Last Dose  . 0.9 %  sodium chloride infusion  500 mL Intravenous Once Irene Shipper, MD         Past Medical History:  Diagnosis Date  . Allergy    RHINITIS  . Anemia   . Arthritis   . Colonic polyp 09/2006   colonoscopy  . GERD (gastroesophageal reflux disease)   . Gout   . Hypertension   . SOB (shortness of breath)    per pt, may be coming from new medication    Past Surgical History:  Procedure Laterality Date  . COLONOSCOPY  09/2006  . KNEE ARTHROSCOPY Left 2011  . [place] SURGERY  1990  . TOTAL KNEE ARTHROPLASTY Left 11/06/2017   Procedure: TOTAL KNEE ARTHROPLASTY;  Surgeon: Dorna Leitz, MD;  Location: [facility];  Service: Orthopedics;  Laterality: Left;    Social History   Socioeconomic History  . Marital status: Married    Spouse name: Not on file  . Number of children: 1  . Years of education: Not on file  . Highest education level:  Not on file  Occupational History  . Not on file  Social Needs  . Financial resource strain: Not on file  . Food insecurity    Worry: Not on file    Inability: Not on file  . Transportation needs    Medical: Not on file    Non-medical: Not on file  Tobacco Use  . Smoking status: Former Smoker    Types: Cigars  . Smokeless tobacco: Never Used  . Tobacco comment: occasional cigar  Substance and Sexual Activity  . Alcohol use: Yes    Alcohol/week: 3.0 standard drinks    Types: 3 Standard drinks or equivalent per week    Comment: occ 2-3 beers  . Drug use: No  . Sexual activity: Yes  Lifestyle  . Physical activity    Days per week: Not on file    Minutes per session: Not on file  . Stress: Not on file  Relationships  . Social  Herbalist on phone: Not on file    Gets together: Not on file    Attends religious service: Not on file    Active member of club or organization: Not on file    Attends meetings of clubs or organizations: Not on file    Relationship status: Not on file  . Intimate partner violence    Fear of current or ex partner: Not on file    Emotionally abused: Not on file    Physically abused: Not on file    Forced sexual activity: Not on file  Other Topics Concern  . Not on file  Social History Narrative  . Not on file    Family History  Problem Relation Age of Onset  . Stroke Father   . Colon cancer Neg Hx   . Rectal cancer Neg Hx     ROS: no fevers or chills, productive cough, hemoptysis, dysphasia, odynophagia, melena, hematochezia, dysuria, hematuria, rash, seizure activity, orthopnea, PND, pedal edema, claudication. Remaining systems are negative.  Physical Exam: Well-developed well-nourished in no acute distress.  Skin is warm and dry.  HEENT is normal.  Neck is supple.  Chest is clear to auscultation with normal expansion.  Cardiovascular exam is regular rate and rhythm.  Abdominal exam nontender or distended. No masses palpated. Extremities show no edema. neuro grossly intact  ECG-sinus bradycardia at a rate of 59, cannot rule out prior septal infarct, left axis deviation.  Personally reviewed  A/P  1 CP-recent CTA showed minimal plaque but no obstructive coronary disease.  No further ischemia evaluation.  Continue medical therapy for mild disease including aspirin and statin.  2 hypertension-blood pressure is controlled today.  Continue present medications and follow.  3 history of PVCs- Continue present dose of beta-blocker.  4 palpitations-patient has had 2 separate episodes of palpitations for 15 minutes with heart rate of 170 by his report.  We discussed options today and he will consider alivecor.  We will make further recommendations if we document  significant arrhythmia.  Kirk Ruths, MD

## 2019-09-16 ENCOUNTER — Ambulatory Visit (INDEPENDENT_AMBULATORY_CARE_PROVIDER_SITE_OTHER): Payer: 59 | Admitting: Cardiology

## 2019-09-16 ENCOUNTER — Other Ambulatory Visit: Payer: Self-pay

## 2019-09-16 ENCOUNTER — Encounter: Payer: Self-pay | Admitting: Cardiology

## 2019-09-16 VITALS — BP 128/72 | HR 59 | Temp 97.3°F | Ht 73.0 in | Wt 182.0 lb

## 2019-09-16 DIAGNOSIS — R931 Abnormal findings on diagnostic imaging of heart and coronary circulation: Secondary | ICD-10-CM

## 2019-09-16 DIAGNOSIS — E7800 Pure hypercholesterolemia, unspecified: Secondary | ICD-10-CM | POA: Diagnosis not present

## 2019-09-16 DIAGNOSIS — R002 Palpitations: Secondary | ICD-10-CM | POA: Diagnosis not present

## 2019-09-16 DIAGNOSIS — I10 Essential (primary) hypertension: Secondary | ICD-10-CM | POA: Diagnosis not present

## 2019-09-16 NOTE — Patient Instructions (Signed)
Medication Instructions:  NO CHANGE *If you need a refill on your cardiac medications before your next appointment, please call your pharmacy*  Lab Work: If you have labs (blood work) drawn today and your tests are completely normal, you will receive your results only by: [name] MyChart Message (if you have MyChart) OR . A paper copy in the mail If you have any lab test that is abnormal or we need to change your treatment, we will call you to review the results.  Follow-Up: At [facility], you and your health needs are our priority.  As part of our continuing mission to provide you with exceptional heart care, we have created designated Provider Care Teams.  These Care Teams include your primary Cardiologist (physician) and Advanced Practice Providers (APPs -  Physician Assistants and Nurse Practitioners) who all work together to provide you with the care you need, when you need it.  Your next appointment:   12 months  The format for your next appointment:   In Person  Provider:   You may see Kirk Ruths MD or one of the following Advanced Practice Providers on your designated Care Team:    Kerin Ransom, PA-C  Sande Rives, [place]  Coletta Memos, [place]   Other Instructions [place]

## 2019-09-23 ENCOUNTER — Other Ambulatory Visit: Payer: Self-pay | Admitting: Family Medicine

## 2019-09-23 ENCOUNTER — Telehealth: Payer: Self-pay

## 2019-09-23 MED ORDER — AMOXICILLIN-POT CLAVULANATE 875-125 MG PO TABS: 1.0000 | ORAL_TABLET | Freq: Two times a day (BID) | ORAL | 0 refills | Status: AC

## 2019-09-23 NOTE — Progress Notes (Signed)
Sent in antibiotic for assumed sinusitis, following up virtual on Wed.

## 2019-09-23 NOTE — Telephone Encounter (Signed)
Called and scheduled patient

## 2019-09-25 ENCOUNTER — Ambulatory Visit (INDEPENDENT_AMBULATORY_CARE_PROVIDER_SITE_OTHER): Payer: 59 | Admitting: Family Medicine

## 2019-09-25 ENCOUNTER — Encounter: Payer: Self-pay | Admitting: Family Medicine

## 2019-09-25 ENCOUNTER — Other Ambulatory Visit: Payer: Self-pay

## 2019-09-25 DIAGNOSIS — M7701 Medial epicondylitis, right elbow: Secondary | ICD-10-CM

## 2019-09-25 HISTORY — DX: Medial epicondylitis, right elbow: M77.01

## 2019-09-25 MED ORDER — ZOLPIDEM TARTRATE 5 MG PO TABS: 5.0000 mg | ORAL_TABLET | Freq: Every evening | ORAL | 1 refills | Status: DC | PRN

## 2019-09-25 MED ORDER — PREDNISONE 20 MG PO TABS: 20.0000 mg | ORAL_TABLET | Freq: Two times a day (BID) | ORAL | 0 refills | Status: DC

## 2019-09-25 NOTE — Progress Notes (Signed)
[name] [address], [place] [address] Phone: [phone] Subjective:   Matthew [name], am serving as a scribe for Dr. Hulan Saas.  I'm seeing this patient by the request  of:    This visit occurred during the SARS-CoV-2 public health emergency.  Safety protocols were in place, including screening questions prior to the visit, additional usage of staff PPE, and extensive cleaning of exam room while observing appropriate contact time as indicated for disinfecting solutions.     CC: Right elbow pain  [id number]  Matthew [name] is a 63 y.o. male coming in with complaint of  Right elbow pain.  Patient states was playing golf and had a severe pain on the medial aspect of the right elbow.  States that it is gotten a little better today.  Initially was unable to actually move it significantly.    Past Medical History:  Diagnosis Date  . Allergy    RHINITIS  . Anemia   . Arthritis   . Colonic polyp 09/2006   colonoscopy  . GERD (gastroesophageal reflux disease)   . Gout   . Hypertension   . SOB (shortness of breath)    per pt, may be coming from new medication   Past Surgical History:  Procedure Laterality Date  . COLONOSCOPY  09/2006  . KNEE ARTHROSCOPY Left 2011  . [place] SURGERY  1990  . TOTAL KNEE ARTHROPLASTY Left 11/06/2017   Procedure: TOTAL KNEE ARTHROPLASTY;  Surgeon: Dorna Leitz, MD;  Location: [facility];  Service: Orthopedics;  Laterality: Left;   Social History   Socioeconomic History  . Marital status: Married    Spouse name: Not on file  . Number of children: 1  . Years of education: Not on file  . Highest education level: Not on file  Occupational History  . Not on file  Social Needs  . Financial resource strain: Not on file  . Food insecurity    Worry: Not on file    Inability: Not on file  . Transportation needs    Medical: Not on file    Non-medical: Not on file  Tobacco Use  . Smoking status:  Former Smoker    Types: Cigars  . Smokeless tobacco: Never Used  . Tobacco comment: occasional cigar  Substance and Sexual Activity  . Alcohol use: Yes    Alcohol/week: 3.0 standard drinks    Types: 3 Standard drinks or equivalent per week    Comment: occ 2-3 beers  . Drug use: [name]  . Sexual activity: Yes  Lifestyle  . Physical activity    Days per week: Not on file    Minutes per session: Not on file  . Stress: Not on file  Relationships  . Social Herbalist on phone: Not on file    Gets together: Not on file    Attends religious service: Not on file    Active member of club or organization: Not on file    Attends meetings of clubs or organizations: Not on file    Relationship status: Not on file  Other Topics Concern  . Not on file  Social History Narrative  . Not on file   Allergies  Allergen Reactions  . Asa [Aspirin] Rash    High dosage ASA causes break out  . Demerol Itching   Family History  Problem Relation Age of Onset  . Stroke Father   . Colon cancer Neg Hx   .  Rectal cancer Neg Hx       Current Outpatient Medications (Cardiovascular):  .  losartan-hydrochlorothiazide (HYZAAR) 50-12.5 MG tablet, Take 1 tablet by mouth daily. .  metoprolol tartrate (LOPRESSOR) 25 MG tablet, Take 0.5 tablets (12.5 mg total) by mouth 2 (two) times daily. .  rosuvastatin (CRESTOR) 20 MG tablet, Take 1 tablet (20 mg total) by mouth daily.   Current Outpatient Medications (Respiratory):  .  fexofenadine (ALLEGRA) 180 MG tablet, Take 180 mg by mouth at bedtime.    Current Outpatient Medications (Analgesics):  .  allopurinol (ZYLOPRIM) 300 MG tablet, Take 1 tablet (300 mg total) by mouth daily. [name]  aspirin EC 81 MG tablet, Take 81 mg by mouth daily. .  meloxicam (MOBIC) 15 MG tablet, TAKE 1 TABLET BY MOUTH EVERY DAY   Current Outpatient Medications (Hematological):  [name]  Ferrous Sulfate (IRON) 325 (65 FE) MG TABS, Take 325 mg by mouth daily.    Current  Outpatient Medications (Other):  .  amoxicillin-clavulanate (AUGMENTIN) 875-125 MG tablet, Take 1 tablet by mouth 2 (two) times daily for 10 days. .  cholecalciferol (VITAMIN D) 1000 units tablet, Take 1,000 Units by mouth daily. .  fish oil-omega-3 fatty acids 1000 MG capsule, Take 2 g by mouth daily.  .  Multiple Vitamin (MULTIVITAMIN) tablet, Take 1 tablet by mouth daily.   [name]  tiZANidine (ZANAFLEX) 4 MG capsule, 1 tablet at night .  traZODone (DESYREL) 50 MG tablet, Take 0.5-1 tablets (25-50 mg total) by mouth at bedtime as needed for sleep. .  valACYclovir (VALTREX) 1000 MG tablet, TAKE 1 TABLET BY MOUTH EVERY DAY .  zolpidem (AMBIEN) 5 MG tablet, Take 1 tablet (5 mg total) by mouth at bedtime as needed for sleep.  Current Facility-Administered Medications (Other):  .  0.9 %  sodium chloride infusion    Past medical history, social, surgical and family history all reviewed in electronic medical record.  [name]   Review of Systems:  [name], visual changes, nausea, vomiting, diarrhea, constipation, dizziness, abdominal pain, skin rash, fevers, chills, night sweats, weight loss, swollen lymph nodes, body aches, joint swelling, muscle aches, chest pain, shortness of breath, mood changes.   Objective  [name] Systems examined below as of    General: [name], dressed appropriately.  HEENT: Pupils equal, extraocular movements intact  Respiratory: Patient's speak in full sentences and does not appear short of breath  Cardiovascular: [name], non tender, [name]  Skin: Warm dry intact with [name]  Abdomen: Soft nontender  Neuro: Cranial nerves II through XII are intact, neurovascularly intact in all extremities with 2+ DTRs and 2+ pulses.  Lymph: [name]  Gait normal with good balance and coordination.  MSK:  tender with full range of motion and good stability and symmetric strength and tone of shoulders, wrist, hip, knee and ankles bilaterally.  Right elbow exam shows the patient does have a fullness of the medial epicondylar region.  Full range of motion [name].  Tender to palpation over the medial aspect.  Neurovascular intact distally.  After verbal consent patient was prepped with alcohol swabs and with a 25-gauge half inch needle injected into the right medial epicondylar region with a total of 0.5 cc of  0.5% Marcaine and 0.5 cc of Kenalog 40 mg/mL [name] blood loss.  Band-Aid placed.  Postinjection instructions given    Impression and Recommendations:     This case required medical decision making of moderate complexity. The above documentation has been reviewed and is accurate and complete Matthew Pulley, DO       Note: This dictation was prepared with Dragon dictation along with smaller phrase technology. Any transcriptional errors that result from this process are unintentional.

## 2019-09-25 NOTE — Assessment & Plan Note (Signed)
Wrist brace given, injection given today, discussed topical anti-inflammatories and icing regimen.  Topical anti-inflammatories as needed.  Follow-up again in 4 to 8 weeks

## 2019-09-25 NOTE — Patient Instructions (Signed)
Golfer's elbow Wrist brace day and night for 2 weeks Ice at end of day See me again in 6 weeks

## 2019-10-02 ENCOUNTER — Other Ambulatory Visit: Payer: Self-pay

## 2019-10-02 ENCOUNTER — Ambulatory Visit (INDEPENDENT_AMBULATORY_CARE_PROVIDER_SITE_OTHER)
Admission: RE | Admit: 2019-10-02 | Discharge: 2019-10-02 | Disposition: A | Discharge status: Home / Self Care | Payer: 59 | Source: Ambulatory Visit | Attending: Family Medicine | Admitting: Family Medicine

## 2019-10-02 DIAGNOSIS — M25521 Pain in right elbow: Secondary | ICD-10-CM

## 2019-10-28 ENCOUNTER — Ambulatory Visit (INDEPENDENT_AMBULATORY_CARE_PROVIDER_SITE_OTHER): Payer: 59 | Admitting: Family Medicine

## 2019-10-28 ENCOUNTER — Encounter: Payer: Self-pay | Admitting: Family Medicine

## 2019-10-28 ENCOUNTER — Other Ambulatory Visit: Payer: Self-pay

## 2019-10-28 VITALS — BP 140/100 | HR 75 | Ht 73.0 in | Wt 187.0 lb

## 2019-10-28 DIAGNOSIS — M999 Biomechanical lesion, unspecified: Secondary | ICD-10-CM

## 2019-10-28 DIAGNOSIS — M542 Cervicalgia: Secondary | ICD-10-CM | POA: Diagnosis not present

## 2019-10-28 MED ORDER — MELOXICAM 15 MG PO TABS: 15.0000 mg | ORAL_TABLET | Freq: Every day | ORAL | 2 refills | Status: DC

## 2019-10-28 NOTE — Assessment & Plan Note (Signed)
Decision today to treat with OMT was based on Physical Exam  After verbal consent patient was treated with HVLA, ME, FPR techniques in cervical, thoracic, rib areas  Patient tolerated the procedure well with improvement in symptoms  Patient given exercises, stretches and lifestyle modifications  See medications in patient instructions if given  Patient will follow up in 4-8 weeks 

## 2019-10-28 NOTE — Progress Notes (Signed)
[name] [address], [place] [address] Phone: [phone] Subjective:      CC: Neck pain follow-up, elbow pain follow-up  [id number]   I, [name], am serving as a scribe for Dr. Hulan Saas. This visit occurred during the SARS-CoV-2 public health emergency.  Safety protocols were in place, including screening questions prior to the visit, additional usage of staff PPE, and extensive cleaning of exam room while observing appropriate contact time as indicated for disinfecting solutions.   09/17/2019 Wrist brace given, injection given today, discussed topical anti-inflammatories and icing regimen.  Topical anti-inflammatories as needed.  Follow-up again in 4 to 8 weeks  Update 10/28/2019 KOHYN PANCIERA is a 63 y.o. male coming in with complaint of right elbow pain. Patient states that his elbow pain has subsided. Pain only with pressure on his hands. He is having right shoulder pain which has been treated with OMT in the past.  Patient is having some mild tightness overall.  Nothing severe.  Still some of the upper back.  Did do a significant amount of driving.    Past Medical History:  Diagnosis Date  . Allergy    RHINITIS  . Anemia   . Arthritis   . Colonic polyp 09/2006   colonoscopy  . GERD (gastroesophageal reflux disease)   . Gout   . Hypertension   . SOB (shortness of breath)    per pt, may be coming from new medication   Past Surgical History:  Procedure Laterality Date  . COLONOSCOPY  09/2006  . KNEE ARTHROSCOPY Left 2011  . [place] SURGERY  1990  . TOTAL KNEE ARTHROPLASTY Left 11/06/2017   Procedure: TOTAL KNEE ARTHROPLASTY;  Surgeon: Dorna Leitz, MD;  Location: [facility];  Service: Orthopedics;  Laterality: Left;   Social History   Socioeconomic History  . Marital status: Married    Spouse name: Not on file  . Number of children: 1  . Years of education: Not on file  . Highest education level: Not on file   Occupational History  . Not on file  Tobacco Use  . Smoking status: Former Smoker    Types: Cigars  . Smokeless tobacco: Never Used  . Tobacco comment: occasional cigar  Substance and Sexual Activity  . Alcohol use: Yes    Alcohol/week: 3.0 standard drinks    Types: 3 Standard drinks or equivalent per week    Comment: occ 2-3 beers  . Drug use: No  . Sexual activity: Yes  Other Topics Concern  . Not on file  Social History Narrative  . Not on file   Social Determinants of Health   Financial Resource Strain:   . Difficulty of Paying Living Expenses: Not on file  Food Insecurity:   . Worried About Charity fundraiser in the Last Year: Not on file  . Ran Out of Food in the Last Year: Not on file  Transportation Needs:   . Lack of Transportation (Medical): Not on file  . Lack of Transportation (Non-Medical): Not on file  Physical Activity:   . Days of Exercise per Week: Not on file  . Minutes of Exercise per Session: Not on file  Stress:   . Feeling of Stress : Not on file  Social Connections:   . Frequency of Communication with Friends and Family: Not on file  . Frequency of Social Gatherings with Friends and Family: Not on file  . Attends Religious Services: Not on file  .  Active Member of Clubs or Organizations: Not on file  . Attends Archivist Meetings: Not on file  . Marital Status: Not on file   Allergies  Allergen Reactions  . Asa [Aspirin] Rash    High dosage ASA causes break out  . Demerol Itching   Family History  Problem Relation Age of Onset  . Stroke Father   . Colon cancer Neg Hx   . Rectal cancer Neg Hx     Current Outpatient Medications (Endocrine & Metabolic):  .  predniSONE (DELTASONE) 20 MG tablet, Take 1 tablet (20 mg total) by mouth 2 (two) times daily with a meal.   Current Outpatient Medications (Cardiovascular):  .  losartan-hydrochlorothiazide (HYZAAR) 50-12.5 MG tablet, Take 1 tablet by mouth daily. .  metoprolol tartrate  (LOPRESSOR) 25 MG tablet, Take 0.5 tablets (12.5 mg total) by mouth 2 (two) times daily. .  rosuvastatin (CRESTOR) 20 MG tablet, Take 1 tablet (20 mg total) by mouth daily.   Current Outpatient Medications (Respiratory):  .  fexofenadine (ALLEGRA) 180 MG tablet, Take 180 mg by mouth at bedtime.    Current Outpatient Medications (Analgesics):  .  allopurinol (ZYLOPRIM) 300 MG tablet, Take 1 tablet (300 mg total) by mouth daily. [name]  aspirin EC 81 MG tablet, Take 81 mg by mouth daily. .  meloxicam (MOBIC) 15 MG tablet, Take 1 tablet (15 mg total) by mouth daily.   Current Outpatient Medications (Hematological):  [name]  Ferrous Sulfate (IRON) 325 (65 FE) MG TABS, Take 325 mg by mouth daily.    Current Outpatient Medications (Other):  .  cholecalciferol (VITAMIN D) 1000 units tablet, Take 1,000 Units by mouth daily. .  fish oil-omega-3 fatty acids 1000 MG capsule, Take 2 g by mouth daily.  .  Multiple Vitamin (MULTIVITAMIN) tablet, Take 1 tablet by mouth daily.   [name]  tiZANidine (ZANAFLEX) 4 MG capsule, 1 tablet at night .  traZODone (DESYREL) 50 MG tablet, Take 0.5-1 tablets (25-50 mg total) by mouth at bedtime as needed for sleep. .  valACYclovir (VALTREX) 1000 MG tablet, TAKE 1 TABLET BY MOUTH EVERY DAY .  zolpidem (AMBIEN) 5 MG tablet, Take 1 tablet (5 mg total) by mouth at bedtime as needed for sleep.  Current Facility-Administered Medications (Other):  .  0.9 %  sodium chloride infusion    Past medical history, social, surgical and family history all reviewed in electronic medical record.  No pertanent information unless stated regarding to the chief complaint.   Review of Systems:  No headache, visual changes, nausea, vomiting, diarrhea, constipation, dizziness, abdominal pain, skin rash, fevers, chills, night sweats, weight loss, swollen lymph nodes, body aches, joint swelling, muscle aches, chest pain, shortness of breath, mood changes.   Objective  Blood pressure (!) 140/100,  pulse 75, height 6\' 1"  (1.854 m), weight 187 lb (84.8 kg), SpO2 98 %.    General: No apparent distress alert and oriented x3 mood and affect normal, dressed appropriately.  HEENT: Pupils equal, extraocular movements intact  Respiratory: Patient's speak in full sentences and does not appear short of breath  Cardiovascular: No lower extremity edema, non tender, no erythema  Skin: Warm dry intact with no signs of infection or rash on extremities or on axial skeleton.  Abdomen: Soft nontender  Neuro: Cranial nerves II through XII are intact, neurovascularly intact in all extremities with 2+ DTRs and 2+ pulses.  Lymph: No lymphadenopathy of posterior or anterior cervical chain or axillae bilaterally.  Gait normal with good balance  and coordination.  MSK:  tender with full range of motion and good stability and symmetric strength and tone of shoulders, wrist, hip, knee and ankles bilaterally.  Right ankle exam shows the patient does have some mild tenderness of the medial condyle region but full range of motion, full strength of the wrist with full flexion.  Neurovascularly intact distally  Neck exam does have some mild loss of lordosis, some limited sidebending to the right and left bilaterally.  5 out of 5 strength in the upper extremities and deep tendon reflexes intact  Osteopathic findings  C6 flexed rotated and side bent left T3 extended rotated and side bent right inhaled third rib T9 extended rotated and side bent left    Impression and Recommendations:     This case required medical decision making of moderate complexity. The above documentation has been reviewed and is accurate and complete Lyndal Pulley, DO       Note: This dictation was prepared with Dragon dictation along with smaller phrase technology. Any transcriptional errors that result from this process are unintentional.

## 2019-10-28 NOTE — Patient Instructions (Addendum)
See me in 5 weeks  Happy New Year!

## 2019-10-28 NOTE — Assessment & Plan Note (Signed)
Multifactorial, seems to be more secondary to patient's posture and ergonomics, we discussed with patient about icing regimen, home exercise, which activities to do which wants to avoid.  Patient is to increase activity slowly over the course of several weeks.  Follow-up with me again 4 to 8 weeks

## 2019-11-19 ENCOUNTER — Other Ambulatory Visit: Payer: Self-pay | Admitting: Family Medicine

## 2019-11-19 NOTE — Telephone Encounter (Signed)
[facility] is requesting to fill pt valtrex. Please advise [facility] 

## 2019-11-26 ENCOUNTER — Other Ambulatory Visit: Payer: Self-pay | Admitting: *Deleted

## 2019-11-26 DIAGNOSIS — I471 Supraventricular tachycardia: Secondary | ICD-10-CM

## 2019-12-02 ENCOUNTER — Ambulatory Visit: Payer: 59 | Admitting: Family Medicine

## 2019-12-03 ENCOUNTER — Other Ambulatory Visit: Payer: Self-pay

## 2019-12-03 ENCOUNTER — Encounter: Payer: Self-pay | Admitting: Family Medicine

## 2019-12-03 ENCOUNTER — Ambulatory Visit (INDEPENDENT_AMBULATORY_CARE_PROVIDER_SITE_OTHER): Payer: 59 | Admitting: Family Medicine

## 2019-12-03 VITALS — BP 110/78 | HR 55 | Ht 73.0 in | Wt 186.0 lb

## 2019-12-03 DIAGNOSIS — M7701 Medial epicondylitis, right elbow: Secondary | ICD-10-CM | POA: Diagnosis not present

## 2019-12-03 DIAGNOSIS — M999 Biomechanical lesion, unspecified: Secondary | ICD-10-CM

## 2019-12-03 DIAGNOSIS — S63013A Subluxation of distal radioulnar joint of unspecified wrist, initial encounter: Secondary | ICD-10-CM

## 2019-12-03 HISTORY — DX: Subluxation of distal radioulnar joint of unspecified wrist, initial encounter: S63.013A

## 2019-12-03 NOTE — Progress Notes (Signed)
[place] Phone: [phone] Subjective:   Matthew [name], am serving as a scribe for Matthew [name]. This visit occurred during the SARS-CoV-2 public health emergency.  Safety protocols were in place, including screening questions prior to the visit, additional usage of staff PPE, and extensive cleaning of exam room while observing appropriate contact time as indicated for disinfecting solutions.    I'm seeing this patient by the request  of:  Matthew Lung, MD  CC: Neck pain follow-up  [id number]  Matthew [name] is a 64 y.o. male coming in with complaint of neck pain. Last seen on 10/28/2019 for OMT. [name] change in pain since last visit.  Continues to have some mild right elbow pain.  States it is worse when he tries to do repetitive activity.  States that it can be very very dull.  Sometimes even affecting nighttime relaxation.     Past Medical History:  Diagnosis Date  . Allergy    RHINITIS  . Anemia   . Arthritis   . Colonic polyp 09/2006   colonoscopy  . GERD (gastroesophageal reflux disease)   . Gout   . Hypertension   . SOB (shortness of breath)    per pt, may be coming from new medication   Past Surgical History:  Procedure Laterality Date  . COLONOSCOPY  09/2006  . KNEE ARTHROSCOPY Left 2011  . [place] SURGERY  1990  . TOTAL KNEE ARTHROPLASTY Left 11/06/2017   Procedure: TOTAL KNEE ARTHROPLASTY;  Surgeon: Dorna Leitz, MD;  Location: [facility];  Service: Orthopedics;  Laterality: Left;   Social History   Socioeconomic History  . Marital status: Married    Spouse name: Not on file  . Number of children: 1  . Years of education: Not on file  . Highest education level: Not on file  Occupational History  . Not on file  Tobacco Use  . Smoking status: Former Smoker    Types: Cigars  . Smokeless tobacco: Never Used  . Tobacco comment: occasional cigar  Substance and Sexual  Activity  . Alcohol use: Yes    Alcohol/week: 3.0 standard drinks    Types: 3 Standard drinks or equivalent per week    Comment: occ 2-3 beers  . Drug use: [name]  . Sexual activity: Yes  Other Topics Concern  . Not on file  Social History Narrative  . Not on file   Social Determinants of Health   Financial Resource Strain:   . Difficulty of Paying Living Expenses: Not on file  Food Insecurity:   . Worried About Charity fundraiser in the Last Year: Not on file  . Ran Out of Food in the Last Year: Not on file  Transportation Needs:   . Lack of Transportation (Medical): Not on file  . Lack of Transportation (Non-Medical): Not on file  Physical Activity:   . Days of Exercise per Week: Not on file  . Minutes of Exercise per Session: Not on file  Stress:   . Feeling of Stress : Not on file  Social Connections:   . Frequency of Communication with Friends and Family: Not on file  . Frequency of Social Gatherings with Friends and Family: Not on file  . Attends Religious Services: Not on file  . Active Member of Clubs or Organizations: Not on file  . Attends Archivist Meetings: Not on file  . Marital Status: Not on file  Allergies  Allergen Reactions  . Asa [Aspirin] Rash    High dosage ASA causes break out  . Demerol Itching   Family History  Problem Relation Age of Onset  . Stroke Father   . Colon cancer Neg Hx   . Rectal cancer Neg Hx     Current Outpatient Medications (Endocrine & Metabolic):  .  predniSONE (DELTASONE) 20 MG tablet, Take 1 tablet (20 mg total) by mouth 2 (two) times daily with a meal.   Current Outpatient Medications (Cardiovascular):  .  losartan-hydrochlorothiazide (HYZAAR) 50-12.5 MG tablet, Take 1 tablet by mouth daily. .  metoprolol tartrate (LOPRESSOR) 25 MG tablet, Take 0.5 tablets (12.5 mg total) by mouth 2 (two) times daily. .  rosuvastatin (CRESTOR) 20 MG tablet, Take 1 tablet (20 mg total) by mouth daily.   Current Outpatient  Medications (Respiratory):  .  fexofenadine (ALLEGRA) 180 MG tablet, Take 180 mg by mouth at bedtime.    Current Outpatient Medications (Analgesics):  .  allopurinol (ZYLOPRIM) 300 MG tablet, Take 1 tablet (300 mg total) by mouth daily. [name]  aspirin EC 81 MG tablet, Take 81 mg by mouth daily. .  meloxicam (MOBIC) 15 MG tablet, Take 1 tablet (15 mg total) by mouth daily.   Current Outpatient Medications (Hematological):  [name]  Ferrous Sulfate (IRON) 325 (65 FE) MG TABS, Take 325 mg by mouth daily.    Current Outpatient Medications (Other):  .  valACYclovir (VALTREX) 1000 MG tablet, TAKE 1 TABLET BY MOUTH EVERY DAY .  cholecalciferol (VITAMIN D) 1000 units tablet, Take 1,000 Units by mouth daily. .  fish oil-omega-3 fatty acids 1000 MG capsule, Take 2 g by mouth daily.  .  Multiple Vitamin (MULTIVITAMIN) tablet, Take 1 tablet by mouth daily.   [name]  tiZANidine (ZANAFLEX) 4 MG capsule, 1 tablet at night .  traZODone (DESYREL) 50 MG tablet, Take 0.5-1 tablets (25-50 mg total) by mouth at bedtime as needed for sleep. [name]  zolpidem (AMBIEN) 5 MG tablet, Take 1 tablet (5 mg total) by mouth at bedtime as needed for sleep.  Current Facility-Administered Medications (Other):  .  0.9 %  sodium chloride infusion   Reviewed prior external information including notes and imaging from  primary care provider As well as notes that were available from care everywhere and other healthcare systems.  Past medical history, social, surgical and family history all reviewed in electronic medical record.  [name]   Review of Systems:  [name], visual changes, nausea, vomiting, diarrhea, constipation, dizziness, abdominal pain, skin rash, fevers, chills, night sweats, weight loss, swollen lymph nodes, body aches, joint swelling, chest pain, shortness of breath, mood changes. POSITIVE muscle aches  Objective  Blood pressure 110/78, pulse (!) 55,  height 6\' 1"  (1.854 m), weight 186 lb (84.4 kg), SpO2 99 %.   General: [name], dressed appropriately.  HEENT: Pupils equal, extraocular movements intact  Respiratory: Patient's speak in full sentences and does not appear short of breath  Cardiovascular: [name], non tender, [name]  Skin: Warm dry intact with [name]  Abdomen: Soft nontender  Neuro: Cranial nerves II through XII are intact, neurovascularly intact in all extremities with 2+ DTRs and 2+ pulses.  Lymph: [name] lymphadenopathy of posterior or anterior cervical chain or axillae bilaterally.  Gait normal with good balance and coordination.  MSK:  Non tender with full range of motion and good stability and symmetric strength and tone of shoulders, , wrist, hip, knee and ankles bilaterally.  Neck does have loss of lordosis.  Patient does have limited range of motion sidebending and rotation right greater than left.  Mild crepitus.  Negative Spurling's.  Mild tightness in the parascapular region.  Right elbow exam shows the patient is still tender over the medial epicondylar region but more on the ulnar nerve.  Positive Tinel's.  With less pain with resisted extension flexion of the wrist at the elbow.   Impression and Recommendations:     This case required medical decision making of moderate complexity. The above documentation has been reviewed and is accurate and complete Lyndal Pulley, DO       Note: This dictation was prepared with Dragon dictation along with smaller phrase technology. Any transcriptional errors that result from this process are unintentional.

## 2019-12-03 NOTE — Assessment & Plan Note (Signed)
Patient is now having more of an ulnar subluxation, discussed the possibility of a compression sleeve that I think will be more beneficial.  Discussed icing regimen and home exercise, discussed which activities to do which wants to avoid.  Patient is to increase activity slowly over the course the next several weeks.  Follow-up again in 6 to 8 weeks

## 2019-12-03 NOTE — Assessment & Plan Note (Signed)
Decision today to treat with OMT was based on Physical Exam  After verbal consent patient was treated with HVLA, ME, FPR techniques in cervical, thoracic, rib,  areas  Patient tolerated the procedure well with improvement in symptoms  Patient given exercises, stretches and lifestyle modifications  See medications in patient instructions if given  Patient will follow up in 4-8 weeks 

## 2019-12-03 NOTE — Patient Instructions (Signed)
Continue the exercises Elbow compression sleeve See me again in 6-8 weeks

## 2019-12-09 ENCOUNTER — Encounter: Payer: Self-pay | Admitting: Family Medicine

## 2019-12-09 ENCOUNTER — Other Ambulatory Visit: Payer: Self-pay

## 2019-12-09 ENCOUNTER — Ambulatory Visit (INDEPENDENT_AMBULATORY_CARE_PROVIDER_SITE_OTHER): Payer: 59 | Admitting: Family Medicine

## 2019-12-09 ENCOUNTER — Ambulatory Visit (INDEPENDENT_AMBULATORY_CARE_PROVIDER_SITE_OTHER): Payer: 59

## 2019-12-09 VITALS — BP 124/84 | HR 47 | Ht 73.0 in

## 2019-12-09 DIAGNOSIS — M25521 Pain in right elbow: Secondary | ICD-10-CM | POA: Diagnosis not present

## 2019-12-09 DIAGNOSIS — M7701 Medial epicondylitis, right elbow: Secondary | ICD-10-CM | POA: Diagnosis not present

## 2019-12-09 MED ORDER — TRAMADOL HCL 50 MG PO TABS: 50.0000 mg | ORAL_TABLET | Freq: Three times a day (TID) | ORAL | 0 refills | Status: AC | PRN

## 2019-12-09 NOTE — Assessment & Plan Note (Addendum)
Patient has worsening symptoms at this time.  It appears the patient has an acute on chronic problem.  Patient did have another injury at this time and I do see a narrowing defect within the tendon.  Mild retraction in this area, mild hypoechoic changes of the joint space as well.  Patient's previous x-rays were fairly unremarkable.  Concern for mild instability of the UCL and MR arthrogram warranted at this time.  Follow-up again after imaging.  Tramadol given

## 2019-12-09 NOTE — Progress Notes (Signed)
[place] [facility] [facility] Phone: [phone] Subjective:   [name], am serving as a scribe for Dr. Hulan Saas. This visit occurred during the SARS-CoV-2 public health emergency.  Safety protocols were in place, including screening questions prior to the visit, additional usage of staff PPE, and extensive cleaning of exam room while observing appropriate contact time as indicated for disinfecting solutions.   I'm seeing this patient by the request  of:  Denita Lung, MD  CC: right elbow pain.   [id number]  STORME LIEN is a 64 y.o. male coming in with complaint of right elbow pain. worsening pain again. Playing golf hit ball and worse pain patient states that it was severe and worse than ever.  Patient states that now any type of movement of the wrist seems to give more trouble on the medial aspect of the elbow.  Waking him up at night.  Daily activities even washing dishes or his face is becoming almost unbearable.    Past Medical History:  Diagnosis Date  . Allergy    RHINITIS  . Anemia   . Arthritis   . Colonic polyp 09/2006   colonoscopy  . GERD (gastroesophageal reflux disease)   . Gout   . Hypertension   . SOB (shortness of breath)    per pt, may be coming from new medication   Past Surgical History:  Procedure Laterality Date  . COLONOSCOPY  09/2006  . KNEE ARTHROSCOPY Left 2011  . [place] SURGERY  1990  . TOTAL KNEE ARTHROPLASTY Left 11/06/2017   Procedure: TOTAL KNEE ARTHROPLASTY;  Surgeon: Dorna Leitz, MD;  Location: [facility];  Service: Orthopedics;  Laterality: Left;   Social History   Socioeconomic History  . Marital status: Married    Spouse name: Not on file  . Number of children: 1  . Years of education: Not on file  . Highest education level: Not on file  Occupational History  . Not on file  Tobacco Use  . Smoking status: Former Smoker    Types: Cigars  . Smokeless tobacco:  Never Used  . Tobacco comment: occasional cigar  Substance and Sexual Activity  . Alcohol use: Yes    Alcohol/week: 3.0 standard drinks    Types: 3 Standard drinks or equivalent per week    Comment: occ 2-3 beers  . Drug use: No  . Sexual activity: Yes  Other Topics Concern  . Not on file  Social History Narrative  . Not on file   Social Determinants of Health   Financial Resource Strain:   . Difficulty of Paying Living Expenses: Not on file  Food Insecurity:   . Worried About Charity fundraiser in the Last Year: Not on file  . Ran Out of Food in the Last Year: Not on file  Transportation Needs:   . Lack of Transportation (Medical): Not on file  . Lack of Transportation (Non-Medical): Not on file  Physical Activity:   . Days of Exercise per Week: Not on file  . Minutes of Exercise per Session: Not on file  Stress:   . Feeling of Stress : Not on file  Social Connections:   . Frequency of Communication with Friends and Family: Not on file  . Frequency of Social Gatherings with Friends and Family: Not on file  . Attends Religious Services: Not on file  . Active Member of Clubs or Organizations: Not on file  . Attends  Club or Organization Meetings: Not on file  . Marital Status: Not on file   Allergies  Allergen Reactions  . Asa [Aspirin] Rash    High dosage ASA causes break out  . Demerol Itching   Family History  Problem Relation Age of Onset  . Stroke Father   . Colon cancer Neg Hx   . Rectal cancer Neg Hx     Current Outpatient Medications (Endocrine & Metabolic):  .  predniSONE (DELTASONE) 20 MG tablet, Take 1 tablet (20 mg total) by mouth 2 (two) times daily with a meal.   Current Outpatient Medications (Cardiovascular):  .  losartan-hydrochlorothiazide (HYZAAR) 50-12.5 MG tablet, Take 1 tablet by mouth daily. .  metoprolol tartrate (LOPRESSOR) 25 MG tablet, Take 0.5 tablets (12.5 mg total) by mouth 2 (two) times daily. .  rosuvastatin (CRESTOR) 20 MG  tablet, Take 1 tablet (20 mg total) by mouth daily.   Current Outpatient Medications (Respiratory):  .  fexofenadine (ALLEGRA) 180 MG tablet, Take 180 mg by mouth at bedtime.    Current Outpatient Medications (Analgesics):  .  allopurinol (ZYLOPRIM) 300 MG tablet, Take 1 tablet (300 mg total) by mouth daily. [name]  aspirin EC 81 MG tablet, Take 81 mg by mouth daily. .  meloxicam (MOBIC) 15 MG tablet, Take 1 tablet (15 mg total) by mouth daily. .  traMADol (ULTRAM) 50 MG tablet, Take 1 tablet (50 mg total) by mouth every 8 (eight) hours as needed for up to 5 days.   Current Outpatient Medications (Hematological):  [name]  Ferrous Sulfate (IRON) 325 (65 FE) MG TABS, Take 325 mg by mouth daily.    Current Outpatient Medications (Other):  .  valACYclovir (VALTREX) 1000 MG tablet, TAKE 1 TABLET BY MOUTH EVERY DAY .  cholecalciferol (VITAMIN D) 1000 units tablet, Take 1,000 Units by mouth daily. .  fish oil-omega-3 fatty acids 1000 MG capsule, Take 2 g by mouth daily.  .  Multiple Vitamin (MULTIVITAMIN) tablet, Take 1 tablet by mouth daily.   [name]  tiZANidine (ZANAFLEX) 4 MG capsule, 1 tablet at night .  traZODone (DESYREL) 50 MG tablet, Take 0.5-1 tablets (25-50 mg total) by mouth at bedtime as needed for sleep. [name]  zolpidem (AMBIEN) 5 MG tablet, Take 1 tablet (5 mg total) by mouth at bedtime as needed for sleep.  Current Facility-Administered Medications (Other):  .  0.9 %  sodium chloride infusion   Reviewed prior external information including notes and imaging from  primary care provider As well as notes that were available from care everywhere and other healthcare systems.  Past medical history, social, surgical and family history all reviewed in electronic medical record.  No pertanent information unless stated regarding to the chief complaint.   Review of Systems:  No headache, visual changes, nausea, vomiting, diarrhea, constipation, dizziness, abdominal pain, skin rash, fevers, chills,  night sweats, weight loss, swollen lymph nodes, body aches, joint swelling, chest pain, shortness of breath, mood changes. POSITIVE muscle aches  Objective  Blood pressure 124/84, pulse (!) 47, height 6\' 1"  (1.854 m), SpO2 99 %.   General: No apparent distress alert and oriented x3 mood and affect normal, dressed appropriately.  HEENT: Pupils equal, extraocular movements intact  Respiratory: Patient's speak in full sentences and does not appear short of breath  Cardiovascular: No lower extremity edema, non tender, no erythema  Skin: Warm dry intact with no signs of infection or rash on extremities or on axial skeleton.  Abdomen: Soft nontender  Neuro: Cranial nerves  II through XII are intact, neurovascularly intact in all extremities with 2+ DTRs and 2+ pulses.  Lymph: No lymphadenopathy of posterior or anterior cervical chain or axillae bilaterally.  Gait normal with good balance and coordination.  MSK:   Right elbow exam shows the patient does have some mild swelling over the medial epicondylar region.  Severely tender to palpation.  Weakness with wrist flexion with 4 out of 5 strength compared to the contralateral side.  Positive Tinel's sign.  Grip strength minorly decreased compared to the contralateral side. Contralateral elbow unremarkable  Limited musculoskeletal ultrasound was performed and interpreted by Lyndal Pulley  Limited ultrasound of patient's elbow shows that there is a new defect noted of the common flexor tendon.  Hypoechoic changes and severe increase in Doppler flow in the area.  Mild gouty deposits noted with a very trace effusion noted.  There does seem to be hypoechoic changes noted surrounding the UCL but unable to evaluate the rest of the UCL in great entirety.    Impression and Recommendations:     This case required medical decision making of moderate complexity. The above documentation has been reviewed and is accurate and complete Lyndal Pulley, DO         Note: This dictation was prepared with Dragon dictation along with smaller phrase technology. Any transcriptional errors that result from this process are unintentional.

## 2019-12-09 NOTE — Patient Instructions (Signed)
[phone] [facility] 2x a day, finger tip sized amount Tramadol at your pharmacy

## 2019-12-10 ENCOUNTER — Encounter: Payer: Self-pay | Admitting: Cardiology

## 2019-12-10 ENCOUNTER — Other Ambulatory Visit: Payer: Self-pay

## 2019-12-10 ENCOUNTER — Ambulatory Visit (INDEPENDENT_AMBULATORY_CARE_PROVIDER_SITE_OTHER): Payer: 59 | Admitting: Cardiology

## 2019-12-10 VITALS — BP 122/82 | HR 55 | Ht 73.0 in | Wt 189.8 lb

## 2019-12-10 DIAGNOSIS — I471 Supraventricular tachycardia: Secondary | ICD-10-CM | POA: Diagnosis not present

## 2019-12-10 NOTE — Progress Notes (Signed)
Electrophysiology Office Note   Date:  12/10/2019   ID:  Estell, Chiu 1956/08/16, MRN [id number]  PCP:  Denita Lung, MD  Cardiologist:  Stanford Breed Primary Electrophysiologist:  Rissa Turley Meredith Leeds, MD    Chief Complaint: SVT   History of Present Illness: Matthew [name] is a 64 y.o. male who is being seen today for the evaluation of SVT at the request of Stanford Breed, Denice Bors, MD. Presenting today for electrophysiology evaluation.  He has a history of hypertension and SVT.  He has had 2 episodes of SVT lasting for approximately 15 minutes with heart rates around 170 bpm.  He has cardia recordings that show SVT episodes with heart rates in the 180s to 190s.  These occur mainly when he stands up, but have also occurred at other times.  They have lasted up to an hour, driving from [place] back to [place].  Today, he denies symptoms of palpitations, chest pain, shortness of breath, orthopnea, PND, lower extremity edema, claudication, dizziness, presyncope, syncope, bleeding, or neurologic sequela. The patient is tolerating medications without difficulties.    Past Medical History:  Diagnosis Date  . Allergy    RHINITIS  . Anemia   . Arthritis   . Colonic polyp 09/2006   colonoscopy  . GERD (gastroesophageal reflux disease)   . Gout   . Hypertension   . SOB (shortness of breath)    per pt, may be coming from new medication   Past Surgical History:  Procedure Laterality Date  . COLONOSCOPY  09/2006  . KNEE ARTHROSCOPY Left 2011  . Carmi SURGERY  1990  . TOTAL KNEE ARTHROPLASTY Left 11/06/2017   Procedure: TOTAL KNEE ARTHROPLASTY;  Surgeon: Dorna Leitz, MD;  Location: [facility];  Service: Orthopedics;  Laterality: Left;     Current Outpatient Medications  Medication Sig Dispense Refill  . allopurinol (ZYLOPRIM) 300 MG tablet Take 1 tablet (300 mg total) by mouth daily. 90 tablet 3  . amoxicillin (AMOXIL) 500 MG capsule TAKE 4 CAPSULES BY MOUTH 1 HR BEFORE DENTAL  TREATMENT    . aspirin EC 81 MG tablet Take 81 mg by mouth daily.    . cholecalciferol (VITAMIN D) 1000 units tablet Take 1,000 Units by mouth daily.    . Ferrous Sulfate (IRON) 325 (65 FE) MG TABS Take 325 mg by mouth daily.     . fexofenadine (ALLEGRA) 180 MG tablet Take 180 mg by mouth at bedtime.     . fish oil-omega-3 fatty acids 1000 MG capsule Take 2 g by mouth daily.     [name] losartan-hydrochlorothiazide (HYZAAR) 50-12.5 MG tablet Take 1 tablet by mouth daily. 90 tablet 3  . meloxicam (MOBIC) 15 MG tablet Take 1 tablet (15 mg total) by mouth daily. 30 tablet 2  . metoprolol tartrate (LOPRESSOR) 25 MG tablet Take 0.5 tablets (12.5 mg total) by mouth 2 (two) times daily. 90 tablet 3  . Multiple Vitamin (MULTIVITAMIN) tablet Take 1 tablet by mouth daily.      . rosuvastatin (CRESTOR) 20 MG tablet Take 1 tablet (20 mg total) by mouth daily. 90 tablet 3  . traMADol (ULTRAM) 50 MG tablet Take 1 tablet (50 mg total) by mouth every 8 (eight) hours as needed for up to 5 days. 15 tablet 0  . valACYclovir (VALTREX) 1000 MG tablet TAKE 1 TABLET BY MOUTH EVERY DAY 30 tablet 5  . zolpidem (AMBIEN) 5 MG tablet Take 1 tablet (5 mg total) by mouth at bedtime as needed  for sleep. 15 tablet 1   Current Facility-Administered Medications  Medication Dose Route Frequency Provider Last Rate Last Admin  . 0.9 %  sodium chloride infusion  500 mL Intravenous Once Irene Shipper, MD        Allergies:   Diona Fanti [aspirin] and Demerol   Social History:  The patient  reports that he has quit smoking. His smoking use included cigars. He has never used smokeless tobacco. He reports current alcohol use of about 3.0 standard drinks of alcohol per week. He reports that he does not use drugs.   Family History:  The patient's family history includes Stroke in his father.    ROS:  Please see the history of present illness.   Otherwise, review of systems is positive for none.   All other systems are reviewed and negative.     PHYSICAL EXAM: VS:  BP 122/82   Pulse (!) 55   Ht 6\' 1"  (1.854 m)   Wt 189 lb 12.8 oz (86.1 kg)   SpO2 97%   BMI 25.04 kg/m  , BMI Body mass index is 25.04 kg/m. GEN: Well nourished, well developed, in no acute distress  HEENT: normal  Neck: no JVD, carotid bruits, or masses Cardiac: RRR; no murmurs, rubs, or gallops,no edema  Respiratory:  clear to auscultation bilaterally, normal work of breathing GI: soft, nontender, nondistended, + BS MS: no deformity or atrophy  Skin: warm and dry Neuro:  Strength and sensation are intact Psych: euthymic mood, full affect  EKG:  EKG is ordered today. Personal review of the ekg ordered shows sinus rhythm, rate 55  Recent Labs: 03/07/2019: BUN 10; Hemoglobin 14.2; Platelets 330; Potassium 4.6; Sodium 139 07/02/2019: Creatinine, Ser 0.80 07/10/2019: ALT 23    Lipid Panel     Component Value Date/Time   CHOL 121 07/10/2019 0804   TRIG 48 07/10/2019 0804   HDL 76 07/10/2019 0804   CHOLHDL 1.6 07/10/2019 0804   CHOLHDL 2.9 11/02/2017 1442   VLDL 13 07/28/2015 0001   LDLCALC 33 07/10/2019 0804   LDLCALC 117 (H) 11/02/2017 1442     Wt Readings from Last 3 Encounters:  12/10/19 189 lb 12.8 oz (86.1 kg)  12/03/19 186 lb (84.4 kg)  10/28/19 187 lb (84.8 kg)      Other studies Reviewed: Additional studies/ records that were reviewed today include: TTE 12/2017  Review of the above records today demonstrates:  - Left ventricle: The cavity size was normal. There was mild  concentric hypertrophy. Systolic function was normal. The  estimated ejection fraction was in the range of 60% to 65%. Wall  motion was normal; there were no regional wall motion  abnormalities. Doppler parameters are consistent with abnormal  left ventricular relaxation (grade 1 diastolic dysfunction).  - Aortic valve: Transvalvular velocity was within the normal range.  There was no stenosis. There was no regurgitation.  - Mitral valve: There was no  regurgitation.  - Left atrium: The atrium was mildly dilated.  - Right ventricle: The cavity size was normal. Wall thickness was  normal. Systolic function was normal.  - Atrial septum: No defect or patent foramen ovale was identified  by color flow Doppler.  - Tricuspid valve: There was trivial regurgitation.  - Pulmonary arteries: Systolic pressure was within the normal  range. PA peak pressure: 34 mm Hg (S).    ASSESSMENT AND PLAN:  1.  SVT: Narrow complex tachycardia found on cardiac monitor.  He is currently on very low-dose metoprolol.  He would like to potentially get off of his metoprolol and would prefer ablation.  Risks and benefits were discussed.  Risks include bleeding, tamponade, heart block, stroke.  The patient understands these risks and is agreed to the procedure.  2. PVCs: Minimally symptomatic.  No changes.  3. Hypertension: Currently well controlled  Case discussed with referring cardiology  Current medicines are reviewed at length with the patient today.   The patient does not have concerns regarding his medicines.  The following changes were made today:  none  Labs/ tests ordered today include:  Orders Placed This Encounter  Procedures  . EKG 12-Lead     Disposition:   FU with Severiano Utsey 3 months  Signed, Eshani Maestre Meredith Leeds, MD  12/10/2019 10:55 AM     [facility] [address] [place] [address] [phone] (office) [phone] (fax)

## 2019-12-10 NOTE — Patient Instructions (Addendum)
Medication Instructions:  Your physician recommends that you continue on your current medications as directed. Please refer to the Current Medication list given to you today.  Labwork: None ordered If you have labs (blood work) drawn today and your tests are completely normal, you will receive your results only by:  [place] (if you have MyChart) OR  A paper copy in the mail If you have any lab test that is abnormal or we need to change your treatment, we will call you to review the results.  Testing/Procedures: Your physician has recommended that you have an ablation. Catheter ablation is a medical procedure used to treat some cardiac arrhythmias (irregular heartbeats). During catheter ablation, a long, thin, flexible tube is put into a blood vessel in your groin (upper thigh), or neck. This tube is called an ablation catheter. It is then guided to your heart through the blood vessel. Radio frequency waves destroy small areas of heart tissue where abnormal heartbeats may cause an arrhythmia to start.  Follow-Up: Your physician recommends that you schedule a follow-up appointment in: 4 weeks, after your procedure on 01/10/2020, with Dr. Curt Bears.  * If you need a refill on your cardiac medications before your next appointment, please call your pharmacy.   Thank you for choosing [facility]!!   Trinidad Curet, RN [phone]  Any Other Special Instructions Will Be Listed Below (If Applicable).    Electrophysiology/Ablation Procedure Instructions   You are scheduled for a(n)  ablation on 01/10/2020 with Dr. Allegra Lai.   1.   Pre procedure testing-             A.  LAB WORK --- On __________, for your pre procedure blood work.  The office will call you if this needs to be scheduled prior to your procedure.               B. COVID TEST-- On 01/07/2020 @ 3:00 pm - You will go to [facility] hospital ([place]) for your Covid testing.   This is a drive thru  test site.  There will be multiple testing areas.  Be sure to share with the first checkpoint that you are there for pre-procedure/surgery testing. This will put you into the right (yellow) lane that leads to the PAT testing team. Stay in your car and the nurse team will come to your car to test you.  After you are tested please go home and self quarantine until the day of your procedure.     2. On the day of your procedure 01/10/2020 you will go to [facility] [phone]) at 8:30 am.  Dennis Bast will go to the main entrance A [employer]) and enter where the [employer] are.  Your driver will drop you off and you will head down the hallway to ADMITTING.  You may have one support person come in to the hospital with you.  They will be asked to wait in the waiting room.   3.   Do not eat or drink after midnight prior to your procedure.   4.   Do NOT take any medications the morning of your procedure.   5.  Plan for an overnight stay.  If you use your phone frequently bring your phone charger.   6. You will follow up with Dr. Curt Bears 4 weeks after your procedure.  This appointment will be made for you.   * If you have ANY questions please call the  office [phone] and ask for Channel Papandrea RN or send me a MyChart message   * Occasionally, EP Studies and ablations can become lengthy.  Please make your family aware of this before your procedure starts.  Average time ranges from 2-8 hours for EP studies/ablations.  Your physician will call your family after the procedure with the results.                                    Cardiac Ablation Cardiac ablation is a procedure to disable (ablate) a small amount of heart tissue in very specific places. The heart has many electrical connections. Sometimes these connections are abnormal and can cause the heart to beat very fast or irregularly. Ablating some of the problem areas can improve the heart rhythm or return it to normal. Ablation may be  done for people who:  Have Wolff-Parkinson-White syndrome.  Have fast heart rhythms (tachycardia).  Have taken medicines for an abnormal heart rhythm (arrhythmia) that were not effective or caused side effects.  Have a high-risk heartbeat that may be life-threatening.  During the procedure, a small incision is made in the neck or the groin, and a long, thin, flexible tube (catheter) is inserted into the incision and moved to the heart. Small devices (electrodes) on the tip of the catheter will send out electrical currents. A type of X-ray (fluoroscopy) will be used to help guide the catheter and to provide images of the heart. Tell a health care provider about:  Any allergies you have.  All medicines you are taking, including vitamins, herbs, eye drops, creams, and over-the-counter medicines.  Any problems you or family members have had with anesthetic medicines.  Any blood disorders you have.  Any surgeries you have had.  Any medical conditions you have, such as kidney failure.  Whether you are pregnant or may be pregnant. What are the risks? Generally, this is a safe procedure. However, problems may occur, including:  Infection.  Bruising and bleeding at the catheter insertion site.  Bleeding into the chest, especially into the sac that surrounds the heart. This is a serious complication.  Stroke or blood clots.  Damage to other structures or organs.  Allergic reaction to medicines or dyes.  Need for a permanent pacemaker if the normal electrical system is damaged. A pacemaker is a small computer that sends electrical signals to the heart and helps your heart beat normally.  The procedure not being fully effective. This may not be recognized until months later. Repeat ablation procedures are sometimes required.  What happens before the procedure?  Follow instructions from your health care provider about eating or drinking restrictions.  Ask your health care provider  about: ? Changing or stopping your regular medicines. This is especially important if you are taking diabetes medicines or blood thinners. ? Taking medicines such as aspirin and ibuprofen. These medicines can thin your blood. Do not take these medicines before your procedure if your health care provider instructs you not to.  Plan to have someone take you home from the hospital or clinic.  If you will be going home right after the procedure, plan to have someone with you for 24 hours. What happens during the procedure?  To lower your risk of infection: ? Your health care team will wash or sanitize their hands. ? Your skin will be washed with soap. ? Hair may be removed from the incision area.  An IV tube will be inserted into one of your veins.  You will be given a medicine to help you relax (sedative).  The skin on your neck or groin will be numbed.  An incision will be made in your neck or your groin.  A needle will be inserted through the incision and into a large vein in your neck or groin.  A catheter will be inserted into the needle and moved to your heart.  Dye may be injected through the catheter to help your surgeon see the area of the heart that needs treatment.  Electrical currents will be sent from the catheter to ablate heart tissue in desired areas. There are three types of energy that may be used to ablate heart tissue: ? Heat (radiofrequency energy). ? Laser energy. ? Extreme cold (cryoablation).  When the necessary tissue has been ablated, the catheter will be removed.  Pressure will be held on the catheter insertion area to prevent excessive bleeding.  A bandage (dressing) will be placed over the catheter insertion area. The procedure may vary among health care providers and hospitals. What happens after the procedure?  Your blood pressure, heart rate, breathing rate, and blood oxygen level will be monitored until the medicines you were given have worn  off.  Your catheter insertion area will be monitored for bleeding. You will need to lie still for a few hours to ensure that you do not bleed from the catheter insertion area.  Do not drive for 5-7 days or as long as directed by your health care provider. Summary  Cardiac ablation is a procedure to disable (ablate) a small amount of heart tissue in very specific places. Ablating some of the problem areas can improve the heart rhythm or return it to normal.  During the procedure, electrical currents will be sent from the catheter to ablate heart tissue in desired areas. This information is not intended to replace advice given to you by your health care provider. Make sure you discuss any questions you have with your health care provider. Document Released: 03/05/2009 Document Revised: 09/05/2016 Document Reviewed: 09/05/2016 Elsevier Interactive Patient Education  [employer].

## 2019-12-12 ENCOUNTER — Ambulatory Visit
Admission: RE | Admit: 2019-12-12 | Discharge: 2019-12-12 | Disposition: A | Discharge status: Home / Self Care | Payer: 59 | Source: Ambulatory Visit | Attending: Family Medicine | Admitting: Family Medicine

## 2019-12-12 ENCOUNTER — Other Ambulatory Visit: Payer: Self-pay

## 2019-12-12 DIAGNOSIS — M25521 Pain in right elbow: Secondary | ICD-10-CM

## 2019-12-12 MED ORDER — IOPAMIDOL (ISOVUE-M 200) INJECTION 41%
10.0000 mL | Freq: Once | INTRAMUSCULAR | Status: AC
  Administered 2019-12-12: 14:00:00 10 mL via INTRA_ARTICULAR

## 2019-12-12 MED ORDER — DIAZEPAM 5 MG PO TABS: ORAL_TABLET | ORAL | 0 refills | Status: DC

## 2019-12-12 NOTE — Addendum Note (Signed)
Addended by: Lyndal Pulley on: 12/12/2019 05:05 PM   Modules accepted: Orders

## 2019-12-13 ENCOUNTER — Other Ambulatory Visit: Payer: Self-pay

## 2019-12-13 DIAGNOSIS — M25521 Pain in right elbow: Secondary | ICD-10-CM

## 2019-12-16 ENCOUNTER — Other Ambulatory Visit: Payer: Self-pay | Admitting: Family Medicine

## 2019-12-25 ENCOUNTER — Telehealth: Payer: Self-pay | Admitting: *Deleted

## 2019-12-25 NOTE — Telephone Encounter (Signed)
Pt informed of upcoming procedure scheduled for 3/12 has been moved up to 3/11. All other procedure instructions remain the same. Patient verbalized understanding and agreeable to plan.

## 2019-12-29 ENCOUNTER — Other Ambulatory Visit: Payer: Self-pay | Admitting: Family Medicine

## 2020-01-03 ENCOUNTER — Ambulatory Visit
Admission: RE | Admit: 2020-01-03 | Discharge: 2020-01-03 | Disposition: A | Discharge status: Home / Self Care | Payer: 59 | Source: Ambulatory Visit | Attending: Family Medicine | Admitting: Family Medicine

## 2020-01-03 ENCOUNTER — Other Ambulatory Visit: Payer: Self-pay

## 2020-01-03 DIAGNOSIS — M25521 Pain in right elbow: Secondary | ICD-10-CM

## 2020-01-03 MED ORDER — IOPAMIDOL (ISOVUE-M 200) INJECTION 41%
3.0000 mL | Freq: Once | INTRAMUSCULAR | Status: AC
  Administered 2020-01-03: 3 mL via INTRA_ARTICULAR

## 2020-01-04 ENCOUNTER — Encounter: Payer: Self-pay | Admitting: Family Medicine

## 2020-01-05 ENCOUNTER — Encounter: Payer: Self-pay | Admitting: Family Medicine

## 2020-01-06 ENCOUNTER — Other Ambulatory Visit: Payer: Self-pay

## 2020-01-06 DIAGNOSIS — S63011D Subluxation of distal radioulnar joint of right wrist, subsequent encounter: Secondary | ICD-10-CM

## 2020-01-06 DIAGNOSIS — M25521 Pain in right elbow: Secondary | ICD-10-CM

## 2020-01-06 DIAGNOSIS — M25529 Pain in unspecified elbow: Secondary | ICD-10-CM

## 2020-01-06 NOTE — Progress Notes (Signed)
Referral has been sent.

## 2020-01-07 ENCOUNTER — Other Ambulatory Visit (HOSPITAL_COMMUNITY)
Admission: RE | Admit: 2020-01-07 | Discharge: 2020-01-07 | Disposition: A | Discharge status: Home / Self Care | Payer: 59 | Source: Ambulatory Visit | Attending: Cardiology | Admitting: Cardiology

## 2020-01-07 DIAGNOSIS — Z20822 Contact with and (suspected) exposure to covid-19: Secondary | ICD-10-CM | POA: Diagnosis not present

## 2020-01-07 DIAGNOSIS — Z01812 Encounter for preprocedural laboratory examination: Secondary | ICD-10-CM | POA: Diagnosis present

## 2020-01-07 LAB — SARS CORONAVIRUS 2 (TAT 6-24 HRS): SARS Coronavirus 2: NEGATIVE

## 2020-01-08 ENCOUNTER — Telehealth: Payer: Self-pay | Admitting: *Deleted

## 2020-01-08 NOTE — Telephone Encounter (Signed)
Aware to arrive at 5:30 tomorrow morning. Pt agreeable to plan

## 2020-01-08 NOTE — Progress Notes (Signed)
Instructed patient on the following items: °Arrival time 0530 °Nothing to eat or drink after midnight °No meds AM of procedure °Responsible person to drive you home and stay with you for 24 hrs ° ° °   °

## 2020-01-08 NOTE — Telephone Encounter (Signed)
Left message for pt to call back to update him on time to arrive for procedure tomorrow

## 2020-01-09 ENCOUNTER — Other Ambulatory Visit: Payer: Self-pay

## 2020-01-09 ENCOUNTER — Ambulatory Visit (HOSPITAL_COMMUNITY)
Admission: RE | Admit: 2020-01-09 | Discharge: 2020-01-09 | Disposition: A | Discharge status: Home / Self Care | Payer: 59 | Attending: Cardiology | Admitting: Cardiology

## 2020-01-09 ENCOUNTER — Encounter (HOSPITAL_COMMUNITY)
Admission: RE | Disposition: A | Discharge status: Home / Self Care | Payer: Self-pay | Source: Home / Self Care | Attending: Cardiology

## 2020-01-09 DIAGNOSIS — I471 Supraventricular tachycardia: Secondary | ICD-10-CM | POA: Insufficient documentation

## 2020-01-09 DIAGNOSIS — K219 Gastro-esophageal reflux disease without esophagitis: Secondary | ICD-10-CM | POA: Diagnosis not present

## 2020-01-09 DIAGNOSIS — Z87891 Personal history of nicotine dependence: Secondary | ICD-10-CM | POA: Diagnosis not present

## 2020-01-09 DIAGNOSIS — Z886 Allergy status to analgesic agent status: Secondary | ICD-10-CM | POA: Diagnosis not present

## 2020-01-09 DIAGNOSIS — I10 Essential (primary) hypertension: Secondary | ICD-10-CM | POA: Diagnosis not present

## 2020-01-09 DIAGNOSIS — Z885 Allergy status to narcotic agent status: Secondary | ICD-10-CM | POA: Diagnosis not present

## 2020-01-09 DIAGNOSIS — M109 Gout, unspecified: Secondary | ICD-10-CM | POA: Insufficient documentation

## 2020-01-09 HISTORY — PX: SVT ABLATION: EP1225

## 2020-01-09 LAB — BASIC METABOLIC PANEL
Anion gap: 13 (ref 5–15)
BUN: 23 mg/dL (ref 8–23)
CO2: 24 mmol/L (ref 22–32)
Calcium: 9.6 mg/dL (ref 8.9–10.3)
Chloride: 103 mmol/L (ref 98–111)
Creatinine, Ser: 0.81 mg/dL (ref 0.61–1.24)
Glucose, Bld: 96 mg/dL (ref 70–99)
Potassium: 4.1 mmol/L (ref 3.5–5.1)
Sodium: 140 mmol/L (ref 135–145)

## 2020-01-09 LAB — CBC
HCT: 38.8 % — ABNORMAL LOW (ref 39.0–52.0)
Hemoglobin: 13 g/dL (ref 13.0–17.0)
MCH: 34 pg (ref 26.0–34.0)
MCHC: 33.5 g/dL (ref 30.0–36.0)
MCV: 101.6 fL — ABNORMAL HIGH (ref 80.0–100.0)
Platelets: 255 10*3/uL (ref 150–400)
RBC: 3.82 MIL/uL — ABNORMAL LOW (ref 4.22–5.81)
RDW: 12.4 % (ref 11.5–15.5)
WBC: 5.1 10*3/uL (ref 4.0–10.5)
nRBC: 0 % (ref 0.0–0.2)

## 2020-01-09 SURGERY — SVT ABLATION

## 2020-01-09 MED ORDER — BUPIVACAINE HCL (PF) 0.25 % IJ SOLN
INTRAMUSCULAR | Status: AC
  Filled 2020-01-09: qty 60

## 2020-01-09 MED ORDER — FENTANYL CITRATE (PF) 100 MCG/2ML IJ SOLN
INTRAMUSCULAR | Status: AC
  Filled 2020-01-09: qty 2

## 2020-01-09 MED ORDER — MIDAZOLAM HCL 5 MG/5ML IJ SOLN
INTRAMUSCULAR | Status: AC
  Filled 2020-01-09: qty 5

## 2020-01-09 MED ORDER — FENTANYL CITRATE (PF) 100 MCG/2ML IJ SOLN
INTRAMUSCULAR | Status: DC | PRN
  Administered 2020-01-09 (×2): 25 ug via INTRAVENOUS
  Administered 2020-01-09: 50 ug via INTRAVENOUS
  Administered 2020-01-09 (×4): 25 ug via INTRAVENOUS

## 2020-01-09 MED ORDER — SODIUM CHLORIDE 0.9% FLUSH: 3.0000 mL | INTRAVENOUS | Status: DC | PRN

## 2020-01-09 MED ORDER — DILTIAZEM HCL ER COATED BEADS 180 MG PO CP24: 180.0000 mg | ORAL_CAPSULE | Freq: Every day | ORAL | 11 refills | Status: DC

## 2020-01-09 MED ORDER — ACETAMINOPHEN 325 MG PO TABS
650.0000 mg | ORAL_TABLET | ORAL | Status: DC | PRN
  Filled 2020-01-09: qty 2

## 2020-01-09 MED ORDER — ONDANSETRON HCL 4 MG/2ML IJ SOLN: 4.0000 mg | Freq: Four times a day (QID) | INTRAMUSCULAR | Status: DC | PRN

## 2020-01-09 MED ORDER — SODIUM CHLORIDE 0.9 % IV SOLN: 250.0000 mL | INTRAVENOUS | Status: DC | PRN

## 2020-01-09 MED ORDER — SODIUM CHLORIDE 0.9 % IV SOLN
INTRAVENOUS | Status: DC | PRN
  Administered 2020-01-09: 2 ug/min via INTRAVENOUS

## 2020-01-09 MED ORDER — MIDAZOLAM HCL 5 MG/5ML IJ SOLN
INTRAMUSCULAR | Status: DC | PRN
  Administered 2020-01-09: 2 mg via INTRAVENOUS
  Administered 2020-01-09 (×3): 1 mg via INTRAVENOUS
  Administered 2020-01-09: 2 mg via INTRAVENOUS
  Administered 2020-01-09: 1 mg via INTRAVENOUS
  Administered 2020-01-09: 2 mg via INTRAVENOUS

## 2020-01-09 MED ORDER — BUPIVACAINE HCL (PF) 0.25 % IJ SOLN
INTRAMUSCULAR | Status: DC | PRN
  Administered 2020-01-09: 60 mL

## 2020-01-09 MED ORDER — HEPARIN (PORCINE) IN NACL 1000-0.9 UT/500ML-% IV SOLN
INTRAVENOUS | Status: DC | PRN
  Administered 2020-01-09: 500 mL

## 2020-01-09 MED ORDER — HEPARIN (PORCINE) IN NACL 1000-0.9 UT/500ML-% IV SOLN
INTRAVENOUS | Status: AC
  Filled 2020-01-09: qty 500

## 2020-01-09 MED ORDER — ISOPROTERENOL HCL 0.2 MG/ML IJ SOLN
INTRAMUSCULAR | Status: AC
  Filled 2020-01-09: qty 5

## 2020-01-09 MED ORDER — SODIUM CHLORIDE 0.9 % IV SOLN: INTRAVENOUS | Status: DC

## 2020-01-09 SURGICAL SUPPLY — 11 items
CATH DECANAV F CURVE (CATHETERS) ×2 IMPLANT
CATH JOSEPH QUAD ALLRED 6F REP (CATHETERS) ×4 IMPLANT
DEVICE CLOSURE PERCLS PRGLD 6F (VASCULAR PRODUCTS) IMPLANT
PACK EP LATEX FREE (CUSTOM PROCEDURE TRAY) ×3
PACK EP LF (CUSTOM PROCEDURE TRAY) ×1 IMPLANT
PAD PRO RADIOLUCENT 2001M-C (PAD) ×3 IMPLANT
PATCH CARTO3 (PAD) ×2 IMPLANT
PERCLOSE PROGLIDE 6F (VASCULAR PRODUCTS) ×12
SHEATH PINNACLE 6F 10CM (SHEATH) ×4 IMPLANT
SHEATH PINNACLE 7F 10CM (SHEATH) ×2 IMPLANT
SHEATH PINNACLE 8F 10CM (SHEATH) ×2 IMPLANT

## 2020-01-09 NOTE — Discharge Instructions (Signed)
No driving for 4 days. No lifting over 5 lbs for 1 week. No sexual activity for 1 week. You may return to work in 1 week. Keep procedure site clean & dry. If you notice increased pain, swelling, bleeding or pus, call/return!  You may shower, but no soaking baths/hot tubs/pools for 1 week. Cardiac Ablation, Care After  This sheet gives you information about how to care for yourself after your procedure. Your health care provider may also give you more specific instructions. If you have problems or questions, contact your health care provider. What can I expect after the procedure? After the procedure, it is common to have:  Bruising around your puncture site.  Tenderness around your puncture site.  Skipped heartbeats.  Tiredness (fatigue).  Follow these instructions at home: Puncture site care   Follow instructions from your health care provider about how to take care of your puncture site. Make sure you: ? If present, leave stitches (sutures), skin glue, or adhesive strips in place. These skin closures may need to stay in place for up to 2 weeks. If adhesive strip edges start to loosen and curl up, you may trim the loose edges. Do not remove adhesive strips completely unless your health care provider tells you to do that.  Check your puncture site every day for signs of infection. Check for: ? Redness, swelling, or pain. ? Fluid or blood. If your puncture site starts to bleed, lie down on your back, apply firm pressure to the area, and contact your health care provider. ? Warmth. ? Pus or a bad smell. Driving  Do not drive for at least 4 days after your procedure or however long your health care provider recommends. (Do not resume driving if you have previously been instructed not to drive for other health reasons.)  Do not drive or use heavy machinery while taking prescription pain medicine. Activity  Avoid activities that take a lot of effort for at least 7 days after your  procedure.  Do not lift anything that is heavier than 5 lb (4.5 kg) for one week.   No sexual activity for 1 week.   Return to your normal activities as told by your health care provider. Ask your health care provider what activities are safe for you. General instructions  Take over-the-counter and prescription medicines only as told by your health care provider.  Do not use any products that contain nicotine or tobacco, such as cigarettes and e-cigarettes. If you need help quitting, ask your health care provider.  You may shower after 24 hours, but Do not take baths, swim, or use a hot tub for 1 week.   Do not drink alcohol for 24 hours after your procedure.  Keep all follow-up visits as told by your health care provider. This is important. Contact a health care provider if:  You have redness, mild swelling, or pain around your puncture site.  You have fluid or blood coming from your puncture site that stops after applying firm pressure to the area.  Your puncture site feels warm to the touch.  You have pus or a bad smell coming from your puncture site.  You have a fever.  You have chest pain or discomfort that spreads to your neck, jaw, or arm.  You are sweating a lot.  You feel nauseous.  You have a fast or irregular heartbeat.  You have shortness of breath.  You are dizzy or light-headed and feel the need to lie down.  You  have pain or numbness in the arm or leg closest to your puncture site. Get help right away if:  Your puncture site suddenly swells.  Your puncture site is bleeding and the bleeding does not stop after applying firm pressure to the area. These symptoms may represent a serious problem that is an emergency. Do not wait to see if the symptoms will go away. Get medical help right away. Call your local emergency services (911 in the [place] Do not drive yourself to the hospital. Summary  After the procedure, it is normal to have bruising and  tenderness at the puncture site in your groin, neck, or forearm.  Check your puncture site every day for signs of infection.  Get help right away if your puncture site is bleeding and the bleeding does not stop after applying firm pressure to the area. This is a medical emergency. This information is not intended to replace advice given to you by your health care provider. Make sure you discuss any questions you have with your health care provider.

## 2020-01-09 NOTE — H&P (Addendum)
Matthew [name] has presented today for surgery, with the diagnosis of SVT.  The various methods of treatment have been discussed with the patient and family. After consideration of risks, benefits and other options for treatment, the patient has consented to  Procedure(s): Catheter ablation as a surgical intervention .  Risks include but not limited to bleeding, tamponade, heart block, stroke, damage to surrounding organs, among others. The patient's history has been reviewed, patient examined, no change in status, stable for surgery.  I have reviewed the patient's chart and labs.  Questions were answered to the patient's satisfaction.    Allegra Lai, MD 01/09/2020 7:05 AM     Electrophysiology Office Note   Date:  01/09/2020   ID:  Matthew [name], DOB September 12, 1956, MRN [id number]  PCP:  Denita Lung, MD  Cardiologist:  Stanford Breed Primary Electrophysiologist:  Stana Bayon Meredith Leeds, MD    Chief Complaint: SVT   History of Present Illness: Matthew [name] is a 64 y.o. male who is being seen today for the evaluation of SVT at the request of No ref. provider found. Presenting today for electrophysiology evaluation.  He has a history of hypertension and SVT.  He has had 2 episodes of SVT lasting for approximately 15 minutes with heart rates around 170 bpm.  He has cardia recordings that show SVT episodes with heart rates in the 180s to 190s.  These occur mainly when he stands up, but have also occurred at other times.  They have lasted up to an hour, driving from [place] back to [place].  Today, he denies symptoms of palpitations, chest pain, shortness of breath, orthopnea, PND, lower extremity edema, claudication, dizziness, presyncope, syncope, bleeding, or neurologic sequela. The patient is tolerating medications without difficulties.    Past Medical History:  Diagnosis Date  . Allergy    RHINITIS  . Anemia   . Arthritis   . Colonic polyp 09/2006   colonoscopy  . GERD  (gastroesophageal reflux disease)   . Gout   . Hypertension   . SOB (shortness of breath)    per pt, may be coming from new medication   Past Surgical History:  Procedure Laterality Date  . COLONOSCOPY  09/2006  . KNEE ARTHROSCOPY Left 2011  . [place] SURGERY  1990  . TOTAL KNEE ARTHROPLASTY Left 11/06/2017   Procedure: TOTAL KNEE ARTHROPLASTY;  Surgeon: Dorna Leitz, MD;  Location: [facility];  Service: Orthopedics;  Laterality: Left;     Current Facility-Administered Medications  Medication Dose Route Frequency Provider Last Rate Last Admin  . 0.9 %  sodium chloride infusion   Intravenous Continuous Oralee Rapaport, Ocie Doyne, MD        Allergies:   Asa [aspirin] and Demerol   Social History:  The patient  reports that he has quit smoking. His smoking use included cigars. He has never used smokeless tobacco. He reports current alcohol use of about 3.0 standard drinks of alcohol per week. He reports that he does not use drugs.   Family History:  The patient's family history includes Stroke in his father.    ROS:  Please see the history of present illness.   Otherwise, review of systems is positive for none.   All other systems are reviewed and negative.    PHYSICAL EXAM: VS:  BP 119/76   Pulse 64   Temp 97.9 F (36.6 C) (Oral)   Resp 18   Ht 6\' 1"  (1.854 m)   Wt 83.9 kg   SpO2 97%  BMI 24.41 kg/m  , BMI Body mass index is 24.41 kg/m. GEN: Well nourished, well developed, in no acute distress  HEENT: normal  Neck: no JVD, carotid bruits, or masses Cardiac: RRR; no murmurs, rubs, or gallops,no edema  Respiratory:  clear to auscultation bilaterally, normal work of breathing GI: soft, nontender, nondistended, + BS MS: no deformity or atrophy  Skin: warm and dry Neuro:  Strength and sensation are intact Psych: euthymic mood, full affect  EKG:  EKG is ordered today. Personal review of the ekg ordered shows sinus rhythm, rate 55  Recent Labs: 07/10/2019: ALT 23 01/09/2020:  BUN 23; Creatinine, Ser PENDING; Hemoglobin 13.0; Platelets 255; Potassium 4.1; Sodium 140    Lipid Panel     Component Value Date/Time   CHOL 121 07/10/2019 0804   TRIG 48 07/10/2019 0804   HDL 76 07/10/2019 0804   CHOLHDL 1.6 07/10/2019 0804   CHOLHDL 2.9 11/02/2017 1442   VLDL 13 07/28/2015 0001   LDLCALC 33 07/10/2019 0804   LDLCALC 117 (H) 11/02/2017 1442     Wt Readings from Last 3 Encounters:  01/09/20 83.9 kg  12/10/19 86.1 kg  12/03/19 84.4 kg      Other studies Reviewed: Additional studies/ records that were reviewed today include: TTE 12/2017  Review of the above records today demonstrates:  - Left ventricle: The cavity size was normal. There was mild  concentric hypertrophy. Systolic function was normal. The  estimated ejection fraction was in the range of 60% to 65%. Wall  motion was normal; there were no regional wall motion  abnormalities. Doppler parameters are consistent with abnormal  left ventricular relaxation (grade 1 diastolic dysfunction).  - Aortic valve: Transvalvular velocity was within the normal range.  There was no stenosis. There was no regurgitation.  - Mitral valve: There was no regurgitation.  - Left atrium: The atrium was mildly dilated.  - Right ventricle: The cavity size was normal. Wall thickness was  normal. Systolic function was normal.  - Atrial septum: No defect or patent foramen ovale was identified  by color flow Doppler.  - Tricuspid valve: There was trivial regurgitation.  - Pulmonary arteries: Systolic pressure was within the normal  range. PA peak pressure: 34 mm Hg (S).    ASSESSMENT AND PLAN:  1.  SVT: Narrow complex tachycardia found on cardiac monitor.  He is currently on very low-dose metoprolol.  He would like to potentially get off of his metoprolol and would prefer ablation.  Risks and benefits were discussed.  Risks include bleeding, tamponade, heart block, stroke.  The patient understands these  risks and is agreed to the procedure.  2. PVCs: Minimally symptomatic.  No changes.  3. Hypertension: Currently well controlled  Case discussed with referring cardiology  Current medicines are reviewed at length with the patient today.   The patient does not have concerns regarding his medicines.  The following changes were made today:  none  Labs/ tests ordered today include:  Orders Placed This Encounter  Procedures  . Basic metabolic panel  . CBC  . Diet NPO time specified Except for: Sips with Meds  . Informed Consent Details: Physician/Practitioner Attestation; Transcribe to consent form and obtain patient signature  . Initiate Pre-op Protocol  . Void on call to EP Lab  . Confirm CBC and BMP (or CMP) results within 7 days for inpatient and 30 days for outpatient:  . Clip right and left femoral area PM before surgery  . Clip right internal jugular area  PM before surgery  . Pre-admission testing diagnosis  . EP STUDY  . Insert peripheral IV     Disposition:   FU with Hodan Wurtz 3 months  Signed, Kanye Depree Meredith Leeds, MD  01/09/2020 7:06 AM     [facility] [address] [place] [address] [phone] (office) [phone] (fax)

## 2020-01-21 ENCOUNTER — Ambulatory Visit (INDEPENDENT_AMBULATORY_CARE_PROVIDER_SITE_OTHER): Payer: 59 | Admitting: Family Medicine

## 2020-01-21 ENCOUNTER — Other Ambulatory Visit: Payer: Self-pay

## 2020-01-21 ENCOUNTER — Encounter: Payer: Self-pay | Admitting: Family Medicine

## 2020-01-21 VITALS — BP 116/88 | Ht 73.0 in | Wt 190.0 lb

## 2020-01-21 DIAGNOSIS — G4762 Sleep related leg cramps: Secondary | ICD-10-CM

## 2020-01-21 DIAGNOSIS — G4730 Sleep apnea, unspecified: Secondary | ICD-10-CM

## 2020-01-21 DIAGNOSIS — M7701 Medial epicondylitis, right elbow: Secondary | ICD-10-CM

## 2020-01-21 DIAGNOSIS — K219 Gastro-esophageal reflux disease without esophagitis: Secondary | ICD-10-CM | POA: Diagnosis not present

## 2020-01-21 MED ORDER — GABAPENTIN 100 MG PO CAPS: 200.0000 mg | ORAL_CAPSULE | Freq: Every day | ORAL | 0 refills | Status: DC

## 2020-01-21 NOTE — Assessment & Plan Note (Signed)
Leg cramping on left leg at night.  Patient does have some known arthritic changes of the back with patient is not having any pain.  Discussed the gabapentin which was prescribed today.  Discussed the possibility of a sleep study with patient snoring and having difficulty with sleep for quite some time.  Patient was referred today.

## 2020-01-21 NOTE — Progress Notes (Signed)
Matthew [name] Phone: [phone] Subjective:   Matthew [name], am serving as a scribe for Dr. Hulan Saas. This visit occurred during the SARS-CoV-2 public health emergency.  Safety protocols were in place, including screening questions prior to the visit, additional usage of staff PPE, and extensive cleaning of exam room while observing appropriate contact time as indicated for disinfecting solutions.   I'm seeing this patient by the request  of:  Denita Lung, MD  CC: Multiple complaints  [id number]   12/09/2019 Patient has worsening symptoms at this time.  It appears the patient has an acute on chronic problem.  Patient did have another injury at this time and I do see a narrowing defect within the tendon.  Mild retraction in this area, mild hypoechoic changes of the joint space as well.  Patient's previous x-rays were fairly unremarkable.  Concern for mild instability of the UCL and MR arthrogram warranted at this time.  Follow-up again after imaging.  Tramadol given  Update 01/21/2020 Matthew [name] is a 64 y.o. male coming in with complaint of right elbow pain. Patient states that he went to see a surgeon and states that they did not recommend surgery. Is seeing Barbaraann Barthel for physical therapy.   Also having left thumb numbness daily from the IP joint distally. [name] numbness in any other fingers.  Is using zanaflex at night for left leg radiculopathy when lying in bed at night. Increasing pain for past 2 weeks. Does not have symptom throughout the day.   Also would like to have a referral for acid reflux. States that he is coughing due to reflux and has increase in mucus production.      Past Medical History:  Diagnosis Date  . Allergy    RHINITIS  . Anemia   . Arthritis   . Colonic polyp 09/2006   colonoscopy  . GERD (gastroesophageal reflux disease)   . Gout   . Hypertension   . SOB  (shortness of breath)    per pt, may be coming from new medication   Past Surgical History:  Procedure Laterality Date  . COLONOSCOPY  09/2006  . KNEE ARTHROSCOPY Left 2011  . [place] SURGERY  1990  . SVT ABLATION N/A 01/09/2020   Procedure: SVT ABLATION;  Surgeon: Constance Haw, MD;  Location: [facility];  Service: Cardiovascular;  Laterality: N/A;  . TOTAL KNEE ARTHROPLASTY Left 11/06/2017   Procedure: TOTAL KNEE ARTHROPLASTY;  Surgeon: Dorna Leitz, MD;  Location: [facility];  Service: Orthopedics;  Laterality: Left;   Social History   Socioeconomic History  . Marital status: Married    Spouse name: Not on file  . Number of children: 1  . Years of education: Not on file  . Highest education level: Not on file  Occupational History  . Not on file  Tobacco Use  . Smoking status: Former Smoker    Types: Cigars  . Smokeless tobacco: Never Used  . Tobacco comment: occasional cigar  Substance and Sexual Activity  . Alcohol use: Yes    Alcohol/week: 3.0 standard drinks    Types: 3 Standard drinks or equivalent per week    Comment: occ 2-3 beers  . Drug use: [name]  . Sexual activity: Yes  Other Topics Concern  . Not on file  Social History Narrative  . Not on file   Social Determinants of Health   Financial Resource Strain:   .  Difficulty of Paying Living Expenses:   Food Insecurity:   . Worried About Charity fundraiser in the Last Year:   . Arboriculturist in the Last Year:   Transportation Needs:   . Film/video editor (Medical):   [name] Lack of Transportation (Non-Medical):   Physical Activity:   . Days of Exercise per Week:   . Minutes of Exercise per Session:   Stress:   . Feeling of Stress :   Social Connections:   . Frequency of Communication with Friends and Family:   . Frequency of Social Gatherings with Friends and Family:   . Attends Religious Services:   . Active Member of Clubs or Organizations:   . Attends Archivist Meetings:     [name] Marital Status:    Allergies  Allergen Reactions  . Asa [Aspirin] Rash    High dosage ASA causes break out  . Demerol Itching   Family History  Problem Relation Age of Onset  . Stroke Father   . Colon cancer Neg Hx   . Rectal cancer Neg Hx       Current Outpatient Medications (Cardiovascular):  .  diltiazem (CARDIZEM CD) 180 MG 24 hr capsule, Take 1 capsule (180 mg total) by mouth daily. [name]  losartan-hydrochlorothiazide (HYZAAR) 50-12.5 MG tablet, Take 1 tablet by mouth daily. .  rosuvastatin (CRESTOR) 20 MG tablet, Take 1 tablet (20 mg total) by mouth daily.   Current Outpatient Medications (Respiratory):  .  fexofenadine (ALLEGRA) 180 MG tablet, Take 180 mg by mouth at bedtime.    Current Outpatient Medications (Analgesics):  .  allopurinol (ZYLOPRIM) 300 MG tablet, Take 1 tablet (300 mg total) by mouth daily. [name]  aspirin EC 81 MG tablet, Take 81 mg by mouth daily. .  meloxicam (MOBIC) 15 MG tablet, Take 1 tablet (15 mg total) by mouth daily. (Patient taking differently: Take 15 mg by mouth daily as needed for pain. )   Current Outpatient Medications (Hematological):  [name]  Ferrous Sulfate (IRON) 325 (65 FE) MG TABS, Take 325 mg by mouth daily.    Current Outpatient Medications (Other):  .  amoxicillin (AMOXIL) 500 MG capsule, Take 2,000 mg by mouth once.  .  cholecalciferol (VITAMIN D) 1000 units tablet, Take 1,000 Units by mouth daily. .  diazepam (VALIUM) 5 MG tablet, One tab by mouth, 2 hours before procedure. (Patient taking differently: Take 5 mg by mouth once. One tab by mouth, 2 hours before procedure.) .  Disposable Gloves [facility] SURGICAL GLOVES) MISC, by Does not apply route. .  fish oil-omega-3 fatty acids 1000 MG capsule, Take 3,600 g by mouth daily.  .  Melatonin 10 MG TABS, Take 10 mg by mouth at bedtime as needed (Sleep). .  Misc Natural Products (TART CHERRY ADVANCED PO), Take 3,600 mg by mouth daily. .  Multiple Vitamin (MULTIVITAMIN) tablet, Take 1  tablet by mouth daily.   [name]  tiZANidine (ZANAFLEX) 4 MG capsule, TAKE 1 CAPSULE BY MOUTH EVERY DAY IN THE EVENING (Patient taking differently: Take 4 mg by mouth at bedtime as needed for muscle spasms. ) .  TURMERIC PO, Take 2,000 mg by mouth daily. .  valACYclovir (VALTREX) 1000 MG tablet, TAKE 1 TABLET BY MOUTH EVERY DAY (Patient taking differently: Take 1,000 mg by mouth daily. ) .  zolpidem (AMBIEN) 5 MG tablet, Take 1 tablet (5 mg total) by mouth at bedtime as needed for sleep. [name]  gabapentin (NEURONTIN) 100 MG capsule, Take 2 capsules (  200 mg total) by mouth at bedtime.  Current Facility-Administered Medications (Other):  .  0.9 %  sodium chloride infusion   Reviewed prior external information including notes and imaging from  primary care provider As well as notes that were available from care everywhere and other healthcare systems.  Past medical history, social, surgical and family history all reviewed in electronic medical record.  [name]   Review of Systems:  [name], visual changes, nausea, vomiting, diarrhea, constipation, dizziness,  skin rash, fevers, chills, night sweats, weight loss, swollen lymph nodes, body aches, joint swelling, chest pain, shortness of breath, mood changes. POSITIVE muscle aches, mild abdominal pain, fatigue, difficulty with sleep  Objective  Blood pressure 116/88, height 6\' 1"  (1.854 m), weight 190 lb (86.2 kg).   General: [name], dressed appropriately.  HEENT: Pupils equal, extraocular movements intact  Respiratory: Patient's speak in full sentences and does not appear short of breath  Cardiovascular: [name], non tender, [name]  Neuro: Cranial nerves II through XII are intact, neurovascularly intact in all extremities with 2+ DTRs and 2+ pulses.  Gait normal with good balance and  coordination. Abdominal exam shows the patient is minimally tender in the epigastric region. MSK:  Right elbow still has some tenderness to palpation over the medial epicondylar region.  Did not do stability testing the patient does have good range of motion at the moment.   Total time with patient and reviewing patient's MRI with him, discussing other management of his issues, as well as putting in referrals and finishing chart greater than 35 minutes   Impression and Recommendations:     This case required medical decision making of moderate complexity. The above documentation has been reviewed and is accurate and complete Lyndal Pulley, DO       Note: This dictation was prepared with Dragon dictation along with smaller phrase technology. Any transcriptional errors that result from this process are unintentional.

## 2020-01-21 NOTE — Assessment & Plan Note (Signed)
Patient did see upper extremity orthopedic specialist and we decided to do conservative therapy secondary to surgical intervention being severe.  Patient will try this.  Discussed we could consider bracing.  Compression, icing regimen, follow-up again in 4 to 8 weeks

## 2020-01-21 NOTE — Assessment & Plan Note (Signed)
Worsening symptoms with significant amount of stress.  Patient will be referred today for further evaluation by gastroenterology that if improved beneficial.  Encourage patient to take his PPI on a regular basis at this moment.  Chronic problem with exacerbation

## 2020-01-21 NOTE — Patient Instructions (Signed)
Gabapentin 200mg  at night Sleep study GI referral Keep working on elbow Try a glove on left hand with golf See me in 5-6 weeks

## 2020-01-23 ENCOUNTER — Encounter: Payer: Self-pay | Admitting: Family Medicine

## 2020-01-25 ENCOUNTER — Other Ambulatory Visit: Payer: Self-pay | Admitting: Family Medicine

## 2020-01-28 ENCOUNTER — Encounter: Payer: Self-pay | Admitting: Family Medicine

## 2020-02-13 ENCOUNTER — Ambulatory Visit: Payer: 59 | Admitting: Cardiology

## 2020-02-15 NOTE — Progress Notes (Signed)
Cardiology Office Note Date:  02/17/2020  Patient ID:  Jose, Pacey [birth year], MRN [id number] PCP:  Denita Lung, MD  Cardiologist:  Dr. Stanford Breed EP: Dr. Curt Bears     Chief Complaint:  s/p ablation  History of Present Illness: DEQUANTA KUEHLER is a 64 y.o. male with history of PE (s/p knee surgery in 2019), non-obstructive CAD by CTa, HTN, PVCs, SVT.  He comes in today to be seen for Dr. Curt Bears.  Referred to him Feb 2021 for evaluation of SVT, recommended and underwent EPS 01/09/2020.  The sudy negative for inducible arrhythmia (paced into AF during isuprel only).  He is doing well.  No groin/procedure site complications/concerns.  With the change from metoprolol to diltiazem he has not had any prolonged/symptomatic tachycardias.  He does though have more frequent though very short lived palpitations.  Seem to be provoked with stepping up, change in elevation.  He is very active, gets on average 18-20,000 steps in a day, golfs regularly, walks.  No CP, no SOB, DOE, no dizzy spells, near syncope or syncope. Excellent exertional capacity  While he can tolerate the brief palpitations, he would prefer none of course.  Past Medical History:  Diagnosis Date  . Allergy    RHINITIS  . Anemia   . Arthritis   . Colonic polyp 09/2006   colonoscopy  . GERD (gastroesophageal reflux disease)   . Gout   . Hypertension   . SOB (shortness of breath)    per pt, may be coming from new medication    Past Surgical History:  Procedure Laterality Date  . COLONOSCOPY  09/2006  . KNEE ARTHROSCOPY Left 2011  . [place] SURGERY  1990  . SVT ABLATION N/A 01/09/2020   Procedure: SVT ABLATION;  Surgeon: Constance Haw, MD;  Location: [facility];  Service: Cardiovascular;  Laterality: N/A;  . TOTAL KNEE ARTHROPLASTY Left 11/06/2017   Procedure: TOTAL KNEE ARTHROPLASTY;  Surgeon: Dorna Leitz, MD;  Location: [facility];  Service: Orthopedics;  Laterality: Left;    Current  Outpatient Medications  Medication Sig Dispense Refill  . allopurinol (ZYLOPRIM) 300 MG tablet Take 1 tablet (300 mg total) by mouth daily. 90 tablet 3  . amoxicillin (AMOXIL) 500 MG capsule Take 2,000 mg by mouth once.     [name] aspirin EC 81 MG tablet Take 81 mg by mouth daily.    . cholecalciferol (VITAMIN D) 1000 units tablet Take 1,000 Units by mouth daily.    . diazepam (VALIUM) 5 MG tablet One tab by mouth, 2 hours before procedure. (Patient taking differently: Take 5 mg by mouth once. One tab by mouth, 2 hours before procedure.) 2 tablet 0  . diltiazem (CARDIZEM CD) 180 MG 24 hr capsule Take 1 capsule (180 mg total) by mouth daily. 30 capsule 11  . Disposable Gloves [facility] SURGICAL GLOVES) MISC by Does not apply route.    . Ferrous Sulfate (IRON) 325 (65 FE) MG TABS Take 325 mg by mouth daily.     . fexofenadine (ALLEGRA) 180 MG tablet Take 180 mg by mouth at bedtime.     . fish oil-omega-3 fatty acids 1000 MG capsule Take 3,600 g by mouth daily.     [name] gabapentin (NEURONTIN) 100 MG capsule Take 2 capsules (200 mg total) by mouth at bedtime. 180 capsule 0  . losartan-hydrochlorothiazide (HYZAAR) 50-12.5 MG tablet Take 1 tablet by mouth daily. 90 tablet 3  . Melatonin 10 MG TABS Take 10 mg by  mouth at bedtime as needed (Sleep).    . meloxicam (MOBIC) 15 MG tablet TAKE 1 TABLET BY MOUTH EVERY DAY 30 tablet 2  . Misc Natural Products (TART CHERRY ADVANCED PO) Take 3,600 mg by mouth daily.    . Multiple Vitamin (MULTIVITAMIN) tablet Take 1 tablet by mouth daily.      [name] tiZANidine (ZANAFLEX) 4 MG capsule TAKE 1 CAPSULE BY MOUTH EVERY DAY IN THE EVENING 30 capsule 0  . TURMERIC PO Take 2,000 mg by mouth daily.    . valACYclovir (VALTREX) 1000 MG tablet TAKE 1 TABLET BY MOUTH EVERY DAY (Patient taking differently: Take 1,000 mg by mouth daily. ) 30 tablet 5  . zolpidem (AMBIEN) 5 MG tablet Take 1 tablet (5 mg total) by mouth at bedtime as needed for sleep. 15 tablet 1  . rosuvastatin (CRESTOR)  20 MG tablet Take 1 tablet (20 mg total) by mouth daily. 90 tablet 3   Current Facility-Administered Medications  Medication Dose Route Frequency Provider Last Rate Last Admin  . 0.9 %  sodium chloride infusion  500 mL Intravenous Once Irene Shipper, MD        Allergies:   Diona Fanti [aspirin] and Demerol   Social History:  The patient  reports that he has quit smoking. His smoking use included cigars. He has never used smokeless tobacco. He reports current alcohol use of about 3.0 standard drinks of alcohol per week. He reports that he does not use drugs.   Family History:  The patient's family history includes Stroke in his father.  ROS:  Please see the history of present illness. All other systems are reviewed and otherwise negative.   PHYSICAL EXAM:  VS:  BP 130/78   Pulse 61   Ht 6\' 1"  (1.854 m)   Wt 187 lb (84.8 kg)   SpO2 97%   BMI 24.67 kg/m  BMI: Body mass index is 24.67 kg/m. Well nourished, well developed, in no acute distress  HEENT: normocephalic, atraumatic  Neck: no JVD, carotid bruits or masses Cardiac:  RRR; no significant murmurs, no rubs, or gallops Lungs:  CTA b/l, no wheezing, rhonchi or rales  Abd: soft, nontender MS: no deformity or atrophy Ext: no edema  Skin: warm and dry, no rash Neuro:  No gross deficits appreciated Psych: euthymic mood, full affect  EKG:  Done today and reviewed by myself shows SR 61, icRBBB, LAD (unchanged)  01/09/2020: EPS, Dr. Curt Bears CONCLUSIONS:  1. Sinus rhythm upon presentation.  2. Negative EP study 3. Pacing into atrial fibrillation during isuprel infusion 4. Cardioversion 5. No early apparent complications.    12/27/2019: TTE Study Conclusions - Left ventricle: The cavity size was normal. There was mild  concentric hypertrophy. Systolic function was normal. The  estimated ejection fraction was in the range of 60% to 65%. Wall  motion was normal; there were no regional wall motion  abnormalities. Doppler  parameters are consistent with abnormal  left ventricular relaxation (grade 1 diastolic dysfunction).  - Aortic valve: Transvalvular velocity was within the normal range.  There was no stenosis. There was no regurgitation.  - Mitral valve: There was no regurgitation.  - Left atrium: The atrium was mildly dilated.  - Right ventricle: The cavity size was normal. Wall thickness was  normal. Systolic function was normal.  - Atrial septum: No defect or patent foramen ovale was identified  by color flow Doppler.  - Tricuspid valve: There was trivial regurgitation.  - Pulmonary arteries: Systolic pressure was within  the normal  range. PA peak pressure: 34 mm Hg (S).     Recent Labs: 07/10/2019: ALT 23 01/09/2020: BUN 23; Creatinine, Ser 0.81; Hemoglobin 13.0; Platelets 255; Potassium 4.1; Sodium 140  07/10/2019: Chol/HDL Ratio 1.6; Cholesterol, Total 121; HDL 76; LDL Chol Calc (NIH) 33; Triglycerides 48   CrCl cannot be calculated (Patient's most recent lab result is older than the maximum 21 days allowed.).   Wt Readings from Last 3 Encounters:  02/17/20 187 lb (84.8 kg)  01/21/20 190 lb (86.2 kg)  01/09/20 185 lb (83.9 kg)     Other studies reviewed: Additional studies/records reviewed today include: summarized above  ASSESSMENT AND PLAN:  1. SVT     Unfortunately not inducible at the time of his EPS      DIlt has improved his arrhythmia burden in that he has not had any sustained episodes, though having brief/mementary palps at increased frequency.  He has a icRBBB, LAD, very narrow QRS 178ms, normal PR, discussed with Dr. Curt Bears consideration of Flecainide, though given the diltiazem had had an impact on his rhythm preferred to titrate his diltiazem 1st, prior to addition of an AAD.      Disposition: Increase his diltiazem to 240mg  daily and f/u with Dr. Curt Bears in 34mo, sooner if needed  Current medicines are reviewed at length with the patient today.  The patient did  not have any concerns regarding medicines.  Venetia Night, PA-C 02/17/2020 9:29 AM     [address] [place] [id number] [phone] (office)  [phone] (fax)

## 2020-02-17 ENCOUNTER — Other Ambulatory Visit: Payer: Self-pay

## 2020-02-17 ENCOUNTER — Ambulatory Visit (INDEPENDENT_AMBULATORY_CARE_PROVIDER_SITE_OTHER): Payer: 59 | Admitting: Physician Assistant

## 2020-02-17 VITALS — BP 130/78 | HR 61 | Ht 73.0 in | Wt 187.0 lb

## 2020-02-17 DIAGNOSIS — I471 Supraventricular tachycardia: Secondary | ICD-10-CM

## 2020-02-17 MED ORDER — DILTIAZEM HCL ER COATED BEADS 240 MG PO CP24: 240.0000 mg | ORAL_CAPSULE | Freq: Every day | ORAL | 11 refills | Status: DC

## 2020-02-17 NOTE — Patient Instructions (Signed)
Medication Instructions:   START TAKING DILTIAZEM 240 MG ONCE A  DAY   *If you need a refill on your cardiac medications before your next appointment, please call your pharmacy*   Lab [place]   If you have labs (blood work) drawn today and your tests are completely normal, you will receive your results only by: [name] MyChart Message (if you have MyChart) OR . A paper copy in the mail If you have any lab test that is abnormal or we need to change your treatment, we will call you to review the results.   Testing/Procedures: NONE ORDERED  TODAY'  Follow-Up: At [facility], you and your health needs are our priority.  As part of our continuing mission to provide you with exceptional heart care, we have created designated Provider Care Teams.  These Care Teams include your primary Cardiologist (physician) and Advanced Practice Providers (APPs -  Physician Assistants and Nurse Practitioners) who all work together to provide you with the care you need, when you need it.  We recommend signing up for the patient portal called "MyChart".  Sign up information is provided on this After Visit Summary.  MyChart is used to connect with patients for Virtual Visits (Telemedicine).  Patients are able to view lab/test results, encounter notes, upcoming appointments, etc.  Non-urgent messages can be sent to your provider as well.   To learn more about what you can do with MyChart, go to [email or web address].    Your next appointment:    2 month(s)  The format for your next appointment:   In Person  Provider:   You may see Will Meredith Leeds, MD    Other Instructions

## 2020-02-27 ENCOUNTER — Other Ambulatory Visit: Payer: Self-pay

## 2020-02-27 ENCOUNTER — Ambulatory Visit (INDEPENDENT_AMBULATORY_CARE_PROVIDER_SITE_OTHER): Payer: 59 | Admitting: Family Medicine

## 2020-02-27 VITALS — BP 128/82 | HR 77 | Ht 73.0 in | Wt 188.0 lb

## 2020-02-27 DIAGNOSIS — Z72820 Sleep deprivation: Secondary | ICD-10-CM | POA: Diagnosis not present

## 2020-02-27 DIAGNOSIS — G4730 Sleep apnea, unspecified: Secondary | ICD-10-CM

## 2020-02-27 DIAGNOSIS — K219 Gastro-esophageal reflux disease without esophagitis: Secondary | ICD-10-CM | POA: Diagnosis not present

## 2020-02-27 DIAGNOSIS — M7701 Medial epicondylitis, right elbow: Secondary | ICD-10-CM | POA: Diagnosis not present

## 2020-02-27 NOTE — Progress Notes (Signed)
[place] Phone: [phone] Subjective:   Matthew [name], am serving as a scribe for Dr. Hulan Saas. This visit occurred during the SARS-CoV-2 public health emergency.  Safety protocols were in place, including screening questions prior to the visit, additional usage of staff PPE, and extensive cleaning of exam room while observing appropriate contact time as indicated for disinfecting solutions.   I'm seeing this patient by the request  of:  Denita Lung, MD  CC: Elbow pain follow-up, other problems  [id number]   01/21/2020 Patient did see upper extremity orthopedic specialist and we decided to do conservative therapy secondary to surgical intervention being severe.  Patient will try this.  Discussed we could consider bracing.  Compression, icing regimen, follow-up again in 4 to 8 weeks  Update 02/27/2020 Matthew [name] is a 64 y.o. male coming in with complaint of right elbow pain. Is able to play golf but has pain with ADLs. Pain over medial aspect of elbow.   Using Nexium and has not had any gastro issues. Does not want further workup for this problem.    Would like to try a sleep study. Has been waking up at night and only sleeping 5-6 hours.       Past Medical History:  Diagnosis Date  . Allergy    RHINITIS  . Anemia   . Arthritis   . Colonic polyp 09/2006   colonoscopy  . GERD (gastroesophageal reflux disease)   . Gout   . Hypertension   . SOB (shortness of breath)    per pt, may be coming from new medication   Past Surgical History:  Procedure Laterality Date  . COLONOSCOPY  09/2006  . KNEE ARTHROSCOPY Left 2011  . [place] SURGERY  1990  . SVT ABLATION N/A 01/09/2020   Procedure: SVT ABLATION;  Surgeon: Constance Haw, MD;  Location: [facility];  Service: Cardiovascular;  Laterality: N/A;  . TOTAL KNEE ARTHROPLASTY Left 11/06/2017   Procedure: TOTAL KNEE  ARTHROPLASTY;  Surgeon: Dorna Leitz, MD;  Location: [facility];  Service: Orthopedics;  Laterality: Left;   Social History   Socioeconomic History  . Marital status: Married    Spouse name: Not on file  . Number of children: 1  . Years of education: Not on file  . Highest education level: Not on file  Occupational History  . Not on file  Tobacco Use  . Smoking status: Former Smoker    Types: Cigars  . Smokeless tobacco: Never Used  . Tobacco comment: occasional cigar  Substance and Sexual Activity  . Alcohol use: Yes    Alcohol/week: 3.0 standard drinks    Types: 3 Standard drinks or equivalent per week    Comment: occ 2-3 beers  . Drug use: [name]  . Sexual activity: Yes  Other Topics Concern  . Not on file  Social History Narrative  . Not on file   Social Determinants of Health   Financial Resource Strain:   . Difficulty of Paying Living Expenses:   Food Insecurity:   . Worried About Charity fundraiser in the Last Year:   . Arboriculturist in the Last Year:   Transportation Needs:   . Film/video editor (Medical):   [name] Lack of Transportation (Non-Medical):   Physical Activity:   . Days of Exercise per Week:   . Minutes of Exercise per Session:   Stress:   .  Feeling of Stress :   Social Connections:   . Frequency of Communication with Friends and Family:   . Frequency of Social Gatherings with Friends and Family:   . Attends Religious Services:   . Active Member of Clubs or Organizations:   . Attends Archivist Meetings:   [name] Marital Status:    Allergies  Allergen Reactions  . Asa [Aspirin] Rash    High dosage ASA causes break out  . Demerol Itching   Family History  Problem Relation Age of Onset  . Stroke Father   . Colon cancer Neg Hx   . Rectal cancer Neg Hx       Current Outpatient Medications (Cardiovascular):  .  diltiazem (CARDIZEM CD) 240 MG 24 hr capsule, Take 1 capsule (240 mg total) by mouth daily. [name]  losartan-hydrochlorothiazide  (HYZAAR) 50-12.5 MG tablet, Take 1 tablet by mouth daily. .  rosuvastatin (CRESTOR) 20 MG tablet, Take 1 tablet (20 mg total) by mouth daily.   Current Outpatient Medications (Respiratory):  .  fexofenadine (ALLEGRA) 180 MG tablet, Take 180 mg by mouth at bedtime.    Current Outpatient Medications (Analgesics):  .  allopurinol (ZYLOPRIM) 300 MG tablet, Take 1 tablet (300 mg total) by mouth daily. [name]  aspirin EC 81 MG tablet, Take 81 mg by mouth daily. .  meloxicam (MOBIC) 15 MG tablet, TAKE 1 TABLET BY MOUTH EVERY DAY   Current Outpatient Medications (Hematological):  [name]  Ferrous Sulfate (IRON) 325 (65 FE) MG TABS, Take 325 mg by mouth daily.    Current Outpatient Medications (Other):  .  amoxicillin (AMOXIL) 500 MG capsule, Take 2,000 mg by mouth once.  .  cholecalciferol (VITAMIN D) 1000 units tablet, Take 1,000 Units by mouth daily. .  diazepam (VALIUM) 5 MG tablet, One tab by mouth, 2 hours before procedure. (Patient taking differently: Take 5 mg by mouth once. One tab by mouth, 2 hours before procedure.) .  Disposable Gloves [facility] SURGICAL GLOVES) MISC, by Does not apply route. .  fish oil-omega-3 fatty acids 1000 MG capsule, Take 3,600 g by mouth daily.  [name]  gabapentin (NEURONTIN) 100 MG capsule, Take 2 capsules (200 mg total) by mouth at bedtime. .  Melatonin 10 MG TABS, Take 10 mg by mouth at bedtime as needed (Sleep). .  Misc Natural Products (TART CHERRY ADVANCED PO), Take 3,600 mg by mouth daily. .  Multiple Vitamin (MULTIVITAMIN) tablet, Take 1 tablet by mouth daily.   [name]  tiZANidine (ZANAFLEX) 4 MG capsule, TAKE 1 CAPSULE BY MOUTH EVERY DAY IN THE EVENING .  TURMERIC PO, Take 2,000 mg by mouth daily. .  valACYclovir (VALTREX) 1000 MG tablet, TAKE 1 TABLET BY MOUTH EVERY DAY (Patient taking differently: Take 1,000 mg by mouth daily. ) .  zolpidem (AMBIEN) 5 MG tablet, Take 1 tablet (5 mg total) by mouth at bedtime as needed for sleep.  Current Facility-Administered  Medications (Other):  .  0.9 %  sodium chloride infusion   Reviewed prior external information including notes and imaging from  primary care provider As well as notes that were available from care everywhere and other healthcare systems.  Past medical history, social, surgical and family history all reviewed in electronic medical record.  [name]   Review of Systems:  [name], visual changes, nausea, vomiting, diarrhea, constipation, dizziness, abdominal pain, skin rash, fevers, chills, night sweats, weight loss, swollen lymph nodes, body aches, joint swelling, chest pain,  shortness of breath, mood changes. POSITIVE muscle aches  Objective  Blood pressure 128/82, pulse 77, height 6\' 1"  (1.854 m), weight 188 lb (85.3 kg), SpO2 98 %.   General: [name], dressed appropriately.  HEENT: Pupils equal, extraocular movements intact  Respiratory: Patient's speak in full sentences and does not appear short of breath  Cardiovascular: [name], non tender, [name]  Neuro: Cranial nerves II through XII are intact, neurovascularly intact in all extremities with 2+ DTRs and 2+ pulses.  Gait normal with good balance and coordination.  MSK:  Non tender with full range of motion and good stability and symmetric strength and tone of shoulders,  wrist, hip, knee and ankles bilaterally.  Right elbow does have tenderness over the medial area.  Mild positive Tinel's of the ulnar area.  Full range of motion otherwise.  Still some gapping with the UCL.   Impression and Recommendations:      The above documentation has been reviewed and is accurate and complete Lyndal Pulley, DO       Note: This dictation was prepared with Dragon dictation along with smaller phrase technology. Any transcriptional errors that result from this process are unintentional.

## 2020-02-27 NOTE — Assessment & Plan Note (Signed)
Chronic problem.  Prolonged custom bracing.  Patient will try compression sleeve and given suggestions that I think will be more beneficial.  Given some mild stability.  Discussed icing regimen.  Follow-up again in 4 to 8 weeks

## 2020-02-27 NOTE — Patient Instructions (Signed)
Good to see you See me again in 7-8 weeks 

## 2020-02-27 NOTE — Assessment & Plan Note (Signed)
Patient was to continue to take the Nexium.  Chronic problem but seems to be doing decent at this point now that has been taking it regularly.

## 2020-02-27 NOTE — Assessment & Plan Note (Signed)
Chronic problem with exacerbation or worsening.  Patient states that he is not having any deep sleep.  Has had work-up for SVT but no treatment for this either.  I do think a sleep study is warranted.  Patient is in agreement with the plan

## 2020-03-03 ENCOUNTER — Encounter: Payer: Self-pay | Admitting: Family Medicine

## 2020-03-04 MED ORDER — DILTIAZEM HCL ER COATED BEADS 300 MG PO CP24: 300.0000 mg | ORAL_CAPSULE | Freq: Every day | ORAL | 3 refills | Status: DC

## 2020-03-09 ENCOUNTER — Ambulatory Visit (INDEPENDENT_AMBULATORY_CARE_PROVIDER_SITE_OTHER): Payer: 59 | Admitting: Family Medicine

## 2020-03-09 ENCOUNTER — Encounter: Payer: Self-pay | Admitting: Family Medicine

## 2020-03-09 ENCOUNTER — Other Ambulatory Visit: Payer: Self-pay

## 2020-03-09 VITALS — BP 134/92 | HR 72 | Temp 98.2°F | Ht 72.0 in | Wt 188.4 lb

## 2020-03-09 DIAGNOSIS — K219 Gastro-esophageal reflux disease without esophagitis: Secondary | ICD-10-CM | POA: Diagnosis not present

## 2020-03-09 DIAGNOSIS — I471 Supraventricular tachycardia, unspecified: Secondary | ICD-10-CM

## 2020-03-09 DIAGNOSIS — Z0000 Encounter for general adult medical examination without abnormal findings: Secondary | ICD-10-CM | POA: Diagnosis not present

## 2020-03-09 DIAGNOSIS — I10 Essential (primary) hypertension: Secondary | ICD-10-CM | POA: Diagnosis not present

## 2020-03-09 DIAGNOSIS — Z87891 Personal history of nicotine dependence: Secondary | ICD-10-CM

## 2020-03-09 DIAGNOSIS — J301 Allergic rhinitis due to pollen: Secondary | ICD-10-CM

## 2020-03-09 DIAGNOSIS — M1712 Unilateral primary osteoarthritis, left knee: Secondary | ICD-10-CM

## 2020-03-09 DIAGNOSIS — M7701 Medial epicondylitis, right elbow: Secondary | ICD-10-CM

## 2020-03-09 DIAGNOSIS — M109 Gout, unspecified: Secondary | ICD-10-CM

## 2020-03-09 DIAGNOSIS — Z125 Encounter for screening for malignant neoplasm of prostate: Secondary | ICD-10-CM

## 2020-03-09 HISTORY — DX: Supraventricular tachycardia: I47.1

## 2020-03-09 HISTORY — DX: Supraventricular tachycardia, unspecified: I47.10

## 2020-03-09 LAB — POCT URINALYSIS DIP (PROADVANTAGE DEVICE)
Bilirubin, UA: NEGATIVE
Blood, UA: NEGATIVE
Glucose, UA: NEGATIVE mg/dL
Ketones, POC UA: NEGATIVE mg/dL
Leukocytes, UA: NEGATIVE
Nitrite, UA: NEGATIVE
Protein Ur, POC: NEGATIVE mg/dL
Specific Gravity, Urine: 1.015
Urobilinogen, Ur: 0.2
pH, UA: 6 (ref 5.0–8.0)

## 2020-03-09 NOTE — Progress Notes (Signed)
   Subjective:    Patient ID: Matthew [name], male    DOB: 1956-08-22, 64 y.o.   MRN: [id number]  HPI He is here for complete examination.  His main concern is difficulty he has had with his right golfers elbow.  He has had this extensively evaluated and at this point conservative care is going to be the main way of handling this.  He is taking gabapentin to help with this and does note some improvement in he states that it also helps with his sleep.  He does have a history of arthritis and is using Mobic with good results.  He also has lesions on his scalp that he occasionally has a month off.  He has had difficulty with SVT and has had an ablation that was not as successful as hoped and presently he is on diltiazem trying to get this under better control.  He does have a history of gout and is on allopurinol having no difficulty with that.  He does have reflux disease and is getting good results with Nexium.  His allergies seem to be under good control.  His work and home life are going well..   Review of Systems  All other systems reviewed and are negative.      Objective:   Physical Exam Alert and in no distress. Tympanic membranes and canals are normal. Pharyngeal area is normal. Neck is supple without adenopathy or thyromegaly. Cardiac exam shows a regular sinus rhythm without murmurs or gallops. Lungs are clear to auscultation. Abdominal exam shows no masses or tenderness.       Assessment & Plan:  Routine general medical examination at a health care facility - Plan: Lipid panel, POCT Urinalysis DIP (Proadvantage Device)  Gout, unspecified cause, unspecified chronicity, unspecified site - Plan: Uric Acid  Gastroesophageal reflux disease without esophagitis  Essential hypertension  Allergic rhinitis due to pollen, unspecified seasonality  Former smoker  Golfer's elbow, right  Primary osteoarthritis of left knee  Screening for prostate cancer - Plan: PSA  SVT  (supraventricular tachycardia) (HCC)  Discussed the SVT with him in the diltiazem.  If he has a breakthrough I recommend that he talk to cardiology about possibly having another ablation.  He will continue to treat his elbow conservatively as well as continue on Mobic for general aches and pains.  Continue on allopurinol.  Discussed prostate cancer screening and will do a PSA for surveillance.  Discussed the use of Nexium but on an as-needed basis to see if he can keep the reflux under good control with that.  Discussed the need for AAA evaluation as well as pneumonia shots when he turns 30.

## 2020-03-10 LAB — LIPID PANEL
Chol/HDL Ratio: 1.9 ratio (ref 0.0–5.0)
Cholesterol, Total: 132 mg/dL (ref 100–199)
HDL: 68 mg/dL (ref >39)
LDL Chol Calc (NIH): 50 mg/dL (ref 0–99)
Triglycerides: 71 mg/dL (ref 0–149)
VLDL Cholesterol Cal: 14 mg/dL (ref 5–40)

## 2020-03-10 LAB — PSA: Prostate Specific Ag, Serum: 0.9 ng/mL (ref 0.0–4.0)

## 2020-03-10 LAB — URIC ACID: Uric Acid: 5.2 mg/dL (ref 3.8–8.4)

## 2020-03-10 MED ORDER — ALLOPURINOL 300 MG PO TABS: 300.0000 mg | ORAL_TABLET | Freq: Every day | ORAL | 3 refills | Status: DC

## 2020-03-10 NOTE — Addendum Note (Signed)
Addended by: Denita Lung on: 03/10/2020 07:41 AM   Modules accepted: Orders

## 2020-04-06 MED ORDER — DILTIAZEM HCL ER COATED BEADS 360 MG PO CP24
360.0000 mg | ORAL_CAPSULE | Freq: Every day | ORAL | 3 refills | Status: DC
Start: 2020-04-06 — End: 2021-04-13

## 2020-04-06 NOTE — Telephone Encounter (Signed)
Constance Haw, MD to Me    2:11 PM Increase diltiazem to 360 mg daily.

## 2020-04-22 ENCOUNTER — Encounter: Payer: Self-pay | Admitting: Family Medicine

## 2020-04-22 ENCOUNTER — Ambulatory Visit (INDEPENDENT_AMBULATORY_CARE_PROVIDER_SITE_OTHER): Payer: 59 | Admitting: Family Medicine

## 2020-04-22 ENCOUNTER — Other Ambulatory Visit: Payer: Self-pay

## 2020-04-22 VITALS — BP 106/76 | HR 74 | Ht 72.0 in | Wt 193.0 lb

## 2020-04-22 DIAGNOSIS — R29898 Other symptoms and signs involving the musculoskeletal system: Secondary | ICD-10-CM

## 2020-04-22 DIAGNOSIS — M999 Biomechanical lesion, unspecified: Secondary | ICD-10-CM

## 2020-04-22 NOTE — Progress Notes (Signed)
[name] [name] Phone: [phone] Subjective:   [name], am serving as a scribe for Dr. Hulan Saas. This visit occurred during the SARS-CoV-2 public health emergency.  Safety protocols were in place, including screening questions prior to the visit, additional usage of staff PPE, and extensive cleaning of exam room while observing appropriate contact time as indicated for disinfecting solutions.   I'm seeing this patient by the request  of:  Denita Lung, MD  CC: Neck pain follow-up  JTT:[id number]   02/27/2020 Chronic problem.  Prolonged custom bracing.  Patient will try compression sleeve and given suggestions that I think will be more beneficial.  Given some mild stability.  Discussed icing regimen.  Follow-up again in 4 to 8 weeks  Update 04/22/2020 Matthew [name] is a 64 y.o. male coming in with complaint of right elbow pain. Patient states that he has golfed without pain. Notices pain at end range flexion when he is washing his face. Otherwise improved.   Patient states that the neck is just more tight than anything else.  Mild radiation but nothing severe.  Has been sleeping better at this time.      Past Medical History:  Diagnosis Date  . Allergy    RHINITIS  . Anemia   . Arthritis   . Bursitis of left foot 11/11/2016  . Colonic polyp 09/2006   colonoscopy  . Ganglion 11/02/2017  . GERD (gastroesophageal reflux disease)   . Golfer's elbow, right 09/25/2019  . Gout   . Hamstring tendinitis of left thigh 07/26/2016  . Heart beat abnormality 12/14/2017  . Herpes genitalis in men 02/14/2014  . Hypertension   . Osteitis pubis ([place]) 01/27/2016  . Primary osteoarthritis of left knee 11/06/2017  . Right groin pain 11/08/2018  . SOB (shortness of breath)    per pt, may be coming from new medication  . Subluxation of distal radial-ulnar joint 12/03/2019  . Subluxation of tendon of long head of biceps  10/15/2016   Injected shoulder 12/19/2016  . Subungual hematoma of foot, initial encounter 05/29/2018  . SVT (supraventricular tachycardia) ([place]) 03/09/2020   Past Surgical History:  Procedure Laterality Date  . COLONOSCOPY  09/2006  . KNEE ARTHROSCOPY Left 2011  . [place] SURGERY  1990  . SVT ABLATION N/A 01/09/2020   Procedure: SVT ABLATION;  Surgeon: Constance Haw, MD;  Location: [facility];  Service: Cardiovascular;  Laterality: N/A;  . TOTAL KNEE ARTHROPLASTY Left 11/06/2017   Procedure: TOTAL KNEE ARTHROPLASTY;  Surgeon: Dorna Leitz, MD;  Location: [facility];  Service: Orthopedics;  Laterality: Left;   Social History   Socioeconomic History  . Marital status: Married    Spouse name: Not on file  . Number of children: 1  . Years of education: Not on file  . Highest education level: Not on file  Occupational History  . Not on file  Tobacco Use  . Smoking status: Former Smoker    Types: Cigars  . Smokeless tobacco: Never Used  . Tobacco comment: occasional cigar  Vaping Use  . Vaping Use: Never used  Substance and Sexual Activity  . Alcohol use: Yes    Alcohol/week: 3.0 standard drinks    Types: 3 Standard drinks or equivalent per week    Comment: occ 2-3 beers  . Drug use: No  . Sexual activity: Yes  Other Topics Concern  . Not on file  Social History Narrative  .  Not on file   Social Determinants of Health   Financial Resource Strain:   . Difficulty of Paying Living Expenses:   Food Insecurity:   . Worried About Charity fundraiser in the Last Year:   . Arboriculturist in the Last Year:   Transportation Needs:   . Film/video editor (Medical):   [name] Lack of Transportation (Non-Medical):   Physical Activity:   . Days of Exercise per Week:   . Minutes of Exercise per Session:   Stress:   . Feeling of Stress :   Social Connections:   . Frequency of Communication with Friends and Family:   . Frequency of Social Gatherings with Friends and  Family:   . Attends Religious Services:   . Active Member of Clubs or Organizations:   . Attends Archivist Meetings:   [name] Marital Status:    Allergies  Allergen Reactions  . Asa [Aspirin] Rash    High dosage ASA causes break out  . Demerol Itching   Family History  Problem Relation Age of Onset  . Stroke Father   . Colon cancer Neg Hx   . Rectal cancer Neg Hx       Current Outpatient Medications (Cardiovascular):  .  diltiazem (CARDIZEM CD) 360 MG 24 hr capsule, Take 1 capsule (360 mg total) by mouth daily. [name]  losartan-hydrochlorothiazide (HYZAAR) 50-12.5 MG tablet, Take 1 tablet by mouth daily. .  rosuvastatin (CRESTOR) 20 MG tablet, Take 1 tablet (20 mg total) by mouth daily.   Current Outpatient Medications (Respiratory):  .  fexofenadine (ALLEGRA) 180 MG tablet, Take 180 mg by mouth at bedtime.    Current Outpatient Medications (Analgesics):  .  allopurinol (ZYLOPRIM) 300 MG tablet, Take 1 tablet (300 mg total) by mouth daily. [name]  aspirin EC 81 MG tablet, Take 81 mg by mouth daily. .  meloxicam (MOBIC) 15 MG tablet, TAKE 1 TABLET BY MOUTH EVERY DAY   Current Outpatient Medications (Hematological):  [name]  Ferrous Sulfate (IRON) 325 (65 FE) MG TABS, Take 325 mg by mouth daily.    Current Outpatient Medications (Other):  .  amoxicillin (AMOXIL) 500 MG capsule, Take 2,000 mg by mouth once.  .  cholecalciferol (VITAMIN D) 1000 units tablet, Take 1,000 Units by mouth daily. .  diazepam (VALIUM) 5 MG tablet, One tab by mouth, 2 hours before procedure. .  Disposable Gloves [facility] SURGICAL GLOVES) MISC, by Does not apply route. .  fish oil-omega-3 fatty acids 1000 MG capsule, Take 3,600 g by mouth daily.  [name]  gabapentin (NEURONTIN) 100 MG capsule, Take 2 capsules (200 mg total) by mouth at bedtime. .  Melatonin 10 MG TABS, Take 10 mg by mouth at bedtime as needed (Sleep). .  Misc Natural Products (TART CHERRY ADVANCED PO), Take 3,600 mg by mouth daily. .   Multiple Vitamin (MULTIVITAMIN) tablet, Take 1 tablet by mouth daily.   [name]  tiZANidine (ZANAFLEX) 4 MG capsule, TAKE 1 CAPSULE BY MOUTH EVERY DAY IN THE EVENING .  TURMERIC PO, Take 2,000 mg by mouth daily. .  valACYclovir (VALTREX) 1000 MG tablet, TAKE 1 TABLET BY MOUTH EVERY DAY (Patient taking differently: Take 1,000 mg by mouth daily. ) .  zolpidem (AMBIEN) 5 MG tablet, Take 1 tablet (5 mg total) by mouth at bedtime as needed for sleep.  Current Facility-Administered Medications (Other):  .  0.9 %  sodium chloride infusion   Reviewed prior external information including notes and imaging from  primary care provider As well as notes that were available from care everywhere and other healthcare systems.  Past medical history, social, surgical and family history all reviewed in electronic medical record.  No pertanent information unless stated regarding to the chief complaint.   Review of Systems:  No  visual changes, nausea, vomiting, diarrhea, constipation, dizziness, abdominal pain, skin rash, fevers, chills, night sweats, weight loss, swollen lymph nodes, body aches, joint swelling, chest pain, shortness of breath, mood changes. POSITIVE muscle aches, mild headaches  Objective  Blood pressure 106/76, pulse 74, height 6' (1.829 m), weight 193 lb (87.5 kg), SpO2 97 %.   General: No apparent distress alert and oriented x3 mood and affect normal, dressed appropriately.  HEENT: Pupils equal, extraocular movements intact  Respiratory: Patient's speak in full sentences and does not appear short of breath  Cardiovascular: No lower extremity edema, non tender, no erythema  Neuro: Cranial nerves II through XII are intact, neurovascularly intact in all extremities with 2+ DTRs and 2+ pulses.  Gait normal with good balance and coordination.  MSK:  Non tender with full range of motion and good stability and symmetric strength and tone of shoulders, elbows, wrist, hip, patient does have a left knee  replacement Neck exam mild loss of lordosis, he does have some mild crepitus with range of motion, negative Spurling's.  5-5 strength of the upper extremities bilaterally.  Osteopathic findings  C2 flexed rotated and side bent right C4 flexed rotated and side bent left C6 flexed rotated and side bent left T3 extended rotated and side bent right inhaled third rib     Impression and Recommendations:     The above documentation has been reviewed and is accurate and complete Lyndal Pulley, DO       Note: This dictation was prepared with Dragon dictation along with smaller phrase technology. Any transcriptional errors that result from this process are unintentional.

## 2020-04-22 NOTE — Assessment & Plan Note (Signed)
   Decision today to treat with OMT was based on Physical Exam  After verbal consent patient was treated with HVLA, ME, FPR techniques in cervical, thoracic, rib,areas, all areas are chronic   Patient tolerated the procedure well with improvement in symptoms  Patient given exercises, stretches and lifestyle modifications  See medications in patient instructions if given  Patient will follow up in 4-8 weeks 

## 2020-04-22 NOTE — Assessment & Plan Note (Signed)
Neck tightness, chronic problem with mild exacerbation.  Patient is sleeping better at the moment.  He does respond well to manipulation.  Discussed posture and ergonomics.  Follow-up again 4 to 8 weeks

## 2020-04-23 ENCOUNTER — Encounter: Payer: Self-pay | Admitting: Cardiology

## 2020-04-23 ENCOUNTER — Other Ambulatory Visit: Payer: Self-pay

## 2020-04-23 ENCOUNTER — Ambulatory Visit (INDEPENDENT_AMBULATORY_CARE_PROVIDER_SITE_OTHER): Payer: 59 | Admitting: Cardiology

## 2020-04-23 VITALS — BP 130/70 | HR 65 | Ht 72.0 in | Wt 194.0 lb

## 2020-04-23 DIAGNOSIS — I471 Supraventricular tachycardia: Secondary | ICD-10-CM

## 2020-04-23 NOTE — Patient Instructions (Signed)
Medication Instructions:  Your physician recommends that you continue on your current medications as directed. Please refer to the Current Medication list given to you today.  *If you need a refill on your cardiac medications before your next appointment, please call your pharmacy*   Lab Work: None ordered If you have labs (blood work) drawn today and your tests are completely normal, you will receive your results only by: . MyChart Message (if you have MyChart) OR . A paper copy in the mail If you have any lab test that is abnormal or we need to change your treatment, we will call you to review the results.   Testing/Procedures: None ordered   Follow-Up: At [facility], you and your health needs are our priority.  As part of our continuing mission to provide you with exceptional heart care, we have created designated Provider Care Teams.  These Care Teams include your primary Cardiologist (physician) and Advanced Practice Providers (APPs -  Physician Assistants and Nurse Practitioners) who all work together to provide you with the care you need, when you need it.  We recommend signing up for the patient portal called "MyChart".  Sign up information is provided on this After Visit Summary.  MyChart is used to connect with patients for Virtual Visits (Telemedicine).  Patients are able to view lab/test results, encounter notes, upcoming appointments, etc.  Non-urgent messages can be sent to your provider as well.   To learn more about what you can do with MyChart, go to [email or web address].    Your next appointment:   3 month(s)  The format for your next appointment:   In Person  Provider:   Will Camnitz, MD   Thank you for choosing [facility]!!   Ido Wollman, RN [phone]    Other Instructions    

## 2020-04-23 NOTE — Progress Notes (Signed)
Electrophysiology Office Note   Date:  04/23/2020   ID:  Fadel, Clason 12/13/1955, MRN [id number]  PCP:  Denita Lung, MD  Cardiologist:  Stanford Breed Primary Electrophysiologist:  Garnette Greb Meredith Leeds, MD    Chief Complaint: SVT   History of Present Illness: JABEZ MOLNER is a 64 y.o. male who is being seen today for the evaluation of SVT at the request of Denita Lung, MD. Presenting today for electrophysiology evaluation.  He has a history of hypertension and SVT.  He has had 2 episodes of SVT lasting for approximately 15 minutes with heart rates around 170 bpm.  He has cardia recordings that show SVT episodes with heart rates in the 180s to 190s.  These occur mainly when he stands up, but have also occurred at other times.  They have lasted up to an hour, driving from [place] back to [place].  He had an attempted ablation 01/09/2020, but unfortunately went into atrial fibrillation multiple times and no EP study could be performed.  He was subsequently put on diltiazem.  Today, denies symptoms of palpitations, chest pain, shortness of breath, orthopnea, PND, lower extremity edema, claudication, dizziness, presyncope, syncope, bleeding, or neurologic sequela. The patient is tolerating medications without difficulties.  Since his ablation attempt, he has had increasing doses of diltiazem.  He is currently on the maximum dose and feels well without major complaint.  He does continue to have episodic palpitations but they only last for a few minutes.  He is much more comfortable on this current dose.   Past Medical History:  Diagnosis Date  . Allergy    RHINITIS  . Anemia   . Arthritis   . Bursitis of left foot 11/11/2016  . Colonic polyp 09/2006   colonoscopy  . Ganglion 11/02/2017  . GERD (gastroesophageal reflux disease)   . Golfer's elbow, right 09/25/2019  . Gout   . Hamstring tendinitis of left thigh 07/26/2016  . Heart beat abnormality 12/14/2017  . Herpes  genitalis in men 02/14/2014  . Hypertension   . Osteitis pubis ([place]) 01/27/2016  . Primary osteoarthritis of left knee 11/06/2017  . Right groin pain 11/08/2018  . SOB (shortness of breath)    per pt, may be coming from new medication  . Subluxation of distal radial-ulnar joint 12/03/2019  . Subluxation of tendon of long head of biceps 10/15/2016   Injected shoulder 12/19/2016  . Subungual hematoma of foot, initial encounter 05/29/2018  . SVT (supraventricular tachycardia) ([place]) 03/09/2020   Past Surgical History:  Procedure Laterality Date  . COLONOSCOPY  09/2006  . KNEE ARTHROSCOPY Left 2011  . [place] SURGERY  1990  . SVT ABLATION N/A 01/09/2020   Procedure: SVT ABLATION;  Surgeon: Constance Haw, MD;  Location: [facility];  Service: Cardiovascular;  Laterality: N/A;  . TOTAL KNEE ARTHROPLASTY Left 11/06/2017   Procedure: TOTAL KNEE ARTHROPLASTY;  Surgeon: Dorna Leitz, MD;  Location: [facility];  Service: Orthopedics;  Laterality: Left;     Current Outpatient Medications  Medication Sig Dispense Refill  . allopurinol (ZYLOPRIM) 300 MG tablet Take 1 tablet (300 mg total) by mouth daily. 90 tablet 3  . amoxicillin (AMOXIL) 500 MG capsule Take 2,000 mg by mouth once.     [name] aspirin EC 81 MG tablet Take 81 mg by mouth daily.    . cholecalciferol (VITAMIN D) 1000 units tablet Take 1,000 Units by mouth daily.    [name] diltiazem (CARDIZEM CD) 360 MG 24 hr  capsule Take 1 capsule (360 mg total) by mouth daily. 90 capsule 3  . Disposable Gloves [facility] SURGICAL GLOVES) MISC by Does not apply route.    . Ferrous Sulfate (IRON) 325 (65 FE) MG TABS Take 325 mg by mouth daily.     . fexofenadine (ALLEGRA) 180 MG tablet Take 180 mg by mouth at bedtime.     . fish oil-omega-3 fatty acids 1000 MG capsule Take 3,600 g by mouth daily.     [name] gabapentin (NEURONTIN) 100 MG capsule Take 2 capsules (200 mg total) by mouth at bedtime. 180 capsule 0  . losartan-hydrochlorothiazide (HYZAAR) 50-12.5 MG  tablet Take 1 tablet by mouth daily. 90 tablet 3  . meloxicam (MOBIC) 15 MG tablet TAKE 1 TABLET BY MOUTH EVERY DAY 30 tablet 2  . Misc Natural Products (TART CHERRY ADVANCED PO) Take 3,600 mg by mouth daily.    . montelukast (SINGULAIR) 10 MG tablet Take 10 mg by mouth at bedtime.    . Multiple Vitamin (MULTIVITAMIN) tablet Take 1 tablet by mouth daily.      . TURMERIC PO Take 2,000 mg by mouth daily.    . valACYclovir (VALTREX) 1000 MG tablet TAKE 1 TABLET BY MOUTH EVERY DAY 30 tablet 5  . rosuvastatin (CRESTOR) 20 MG tablet Take 1 tablet (20 mg total) by mouth daily. 90 tablet 3   Current Facility-Administered Medications  Medication Dose Route Frequency Provider Last Rate Last Admin  . 0.9 %  sodium chloride infusion  500 mL Intravenous Once Irene Shipper, MD        Allergies:   Diona Fanti [aspirin] and Demerol   Social History:  The patient  reports that he has quit smoking. His smoking use included cigars. He has never used smokeless tobacco. He reports current alcohol use of about 3.0 standard drinks of alcohol per week. He reports that he does not use drugs.   Family History:  The patient's family history includes Stroke in his father.   ROS:  Please see the history of present illness.   Otherwise, review of systems is positive for none.   All other systems are reviewed and negative.   PHYSICAL EXAM: VS:  BP 130/70   Pulse 65   Ht 6' (1.829 m)   Wt 194 lb (88 kg)   SpO2 94%   BMI 26.31 kg/m  , BMI Body mass index is 26.31 kg/m. GEN: Well nourished, well developed, in no acute distress  HEENT: normal  Neck: no JVD, carotid bruits, or masses Cardiac: RRR; no murmurs, rubs, or gallops,no edema  Respiratory:  clear to auscultation bilaterally, normal work of breathing GI: soft, nontender, nondistended, + BS MS: no deformity or atrophy  Skin: warm and dry Neuro:  Strength and sensation are intact Psych: euthymic mood, full affect  EKG:  EKG is not ordered today. Personal review  of the ekg ordered 02/17/20 shows sinus rhythm, rate 61  Recent Labs: 07/10/2019: ALT 23 01/09/2020: BUN 23; Creatinine, Ser 0.81; Hemoglobin 13.0; Platelets 255; Potassium 4.1; Sodium 140    Lipid Panel     Component Value Date/Time   CHOL 132 03/09/2020 1141   TRIG 71 03/09/2020 1141   HDL 68 03/09/2020 1141   CHOLHDL 1.9 03/09/2020 1141   CHOLHDL 2.9 11/02/2017 1442   VLDL 13 07/28/2015 0001   LDLCALC 50 03/09/2020 1141   LDLCALC 117 (H) 11/02/2017 1442     Wt Readings from Last 3 Encounters:  04/23/20 194 lb (88 kg)  04/22/20  193 lb (87.5 kg)  03/09/20 188 lb 6.4 oz (85.5 kg)      Other studies Reviewed: Additional studies/ records that were reviewed today include: TTE 12/2017  Review of the above records today demonstrates:  - Left ventricle: The cavity size was normal. There was mild  concentric hypertrophy. Systolic function was normal. The  estimated ejection fraction was in the range of 60% to 65%. Wall  motion was normal; there were no regional wall motion  abnormalities. Doppler parameters are consistent with abnormal  left ventricular relaxation (grade 1 diastolic dysfunction).  - Aortic valve: Transvalvular velocity was within the normal range.  There was no stenosis. There was no regurgitation.  - Mitral valve: There was no regurgitation.  - Left atrium: The atrium was mildly dilated.  - Right ventricle: The cavity size was normal. Wall thickness was  normal. Systolic function was normal.  - Atrial septum: No defect or patent foramen ovale was identified  by color flow Doppler.  - Tricuspid valve: There was trivial regurgitation.  - Pulmonary arteries: Systolic pressure was within the normal  range. PA peak pressure: 34 mm Hg (S).    ASSESSMENT AND PLAN:  1.  SVT: Narrow complex tachycardia found on cardiac monitor.  He had an attempted ablation 01/09/2020, unfortunately though went into atrial fibrillation.  No pacing could be performed  due to recurrences of atrial fibrillation.  He is currently on diltiazem.  He is feeling well with quite a few less palpitations.  He is comfortable with his current symptoms, though may wish to have a try at ablation again.  We Cicero Noy hold off for the next 3 months, and plan for potential ablation at that time if symptoms have not improved.  2. PVCs: Minimal symptoms.  No changes.  3. Hypertension: Currently well controlled  Current medicines are reviewed at length with the patient today.   The patient does not have concerns regarding his medicines.  The following changes were made today: None  Labs/ tests ordered today include:  No orders of the defined types were placed in this encounter.    Disposition:   FU with Cleatus Gabriel 3 months  Signed, Jaimeson Gopal Meredith Leeds, MD  04/23/2020 8:46 AM     [facility] [address] [place] [address] [phone] (office) [phone] (fax)

## 2020-04-25 ENCOUNTER — Other Ambulatory Visit: Payer: Self-pay | Admitting: Family Medicine

## 2020-05-25 DIAGNOSIS — Z8679 Personal history of other diseases of the circulatory system: Secondary | ICD-10-CM | POA: Insufficient documentation

## 2020-05-25 DIAGNOSIS — B009 Herpesviral infection, unspecified: Secondary | ICD-10-CM | POA: Insufficient documentation

## 2020-05-29 ENCOUNTER — Ambulatory Visit (INDEPENDENT_AMBULATORY_CARE_PROVIDER_SITE_OTHER): Payer: 59 | Admitting: Family Medicine

## 2020-05-29 ENCOUNTER — Other Ambulatory Visit: Payer: Self-pay

## 2020-05-29 ENCOUNTER — Encounter: Payer: Self-pay | Admitting: Family Medicine

## 2020-05-29 VITALS — BP 118/78 | HR 71 | Ht 72.0 in | Wt 187.0 lb

## 2020-05-29 DIAGNOSIS — M2550 Pain in unspecified joint: Secondary | ICD-10-CM

## 2020-05-29 DIAGNOSIS — M999 Biomechanical lesion, unspecified: Secondary | ICD-10-CM | POA: Diagnosis not present

## 2020-05-29 DIAGNOSIS — R29898 Other symptoms and signs involving the musculoskeletal system: Secondary | ICD-10-CM | POA: Diagnosis not present

## 2020-05-29 MED ORDER — CYANOCOBALAMIN 1000 MCG/ML IJ SOLN
1000.0000 ug | Freq: Once | INTRAMUSCULAR | Status: AC
  Administered 2020-05-29: 1000 ug via INTRAMUSCULAR

## 2020-05-29 NOTE — Assessment & Plan Note (Signed)
Continue tightness from mild overall.  Discussed posture and [place], discussed the tightness that patient has in his back in showing differential ways to transition to some of the disengage of the hip flexors to allow him to go deeper.  We discussed with the knee replacement not to go too deep.  Attempted a B12 injection secondary to the fatigue follow-up with me again 4 to 8 weeks

## 2020-05-29 NOTE — Assessment & Plan Note (Signed)

## 2020-05-29 NOTE — Progress Notes (Signed)
[place] Phone: [phone] Subjective:   Matthew [name], am serving as a scribe for Dr. Hulan Saas. This visit occurred during the SARS-CoV-2 public health emergency.  Safety protocols were in place, including screening questions prior to the visit, additional usage of staff PPE, and extensive cleaning of exam room while observing appropriate contact time as indicated for disinfecting solutions.   I'm seeing this patient by the request  of:  Denita Lung, MD  CC: Back and neck pain follow-up  [id number]  Matthew [name] is a 64 y.o. male coming in with complaint of back pain. OMT 04/22/2020. Shoulder tightness.    Elbow is still doing well.  Patient feels like he is doing so relatively well with this and would 10 avoid any type of surgical intervention for the medial epicondylitis Has noticed some fatigue.  Is seeing a new provider to see if he can get more information on the fatigue and they are starting B12 supplementation.     Past Medical History:  Diagnosis Date  . Allergy    RHINITIS  . Anemia   . Arthritis   . Bursitis of left foot 11/11/2016  . Colonic polyp 09/2006   colonoscopy  . Ganglion 11/02/2017  . GERD (gastroesophageal reflux disease)   . Golfer's elbow, right 09/25/2019  . Gout   . Hamstring tendinitis of left thigh 07/26/2016  . Heart beat abnormality 12/14/2017  . Herpes genitalis in men 02/14/2014  . Hypertension   . Osteitis pubis ([place]) 01/27/2016  . Primary osteoarthritis of left knee 11/06/2017  . Right groin pain 11/08/2018  . SOB (shortness of breath)    per pt, may be coming from new medication  . Subluxation of distal radial-ulnar joint 12/03/2019  . Subluxation of tendon of long head of biceps 10/15/2016   Injected shoulder 12/19/2016  . Subungual hematoma of foot, initial encounter 05/29/2018  . SVT (supraventricular tachycardia) ([place]) 03/09/2020   Past Surgical History:   Procedure Laterality Date  . COLONOSCOPY  09/2006  . KNEE ARTHROSCOPY Left 2011  . [place] SURGERY  1990  . SVT ABLATION N/A 01/09/2020   Procedure: SVT ABLATION;  Surgeon: Constance Haw, MD;  Location: [facility];  Service: Cardiovascular;  Laterality: N/A;  . TOTAL KNEE ARTHROPLASTY Left 11/06/2017   Procedure: TOTAL KNEE ARTHROPLASTY;  Surgeon: Dorna Leitz, MD;  Location: [facility];  Service: Orthopedics;  Laterality: Left;   Social History   Socioeconomic History  . Marital status: Married    Spouse name: Not on file  . Number of children: 1  . Years of education: Not on file  . Highest education level: Not on file  Occupational History  . Not on file  Tobacco Use  . Smoking status: Former Smoker    Types: Cigars  . Smokeless tobacco: Never Used  . Tobacco comment: occasional cigar  Vaping Use  . Vaping Use: Never used  Substance and Sexual Activity  . Alcohol use: Yes    Alcohol/week: 3.0 standard drinks    Types: 3 Standard drinks or equivalent per week    Comment: occ 2-3 beers  . Drug use: [name]  . Sexual activity: Yes  Other Topics Concern  . Not on file  Social History Narrative  . Not on file   Social Determinants of Health   Financial Resource Strain:   . Difficulty of Paying Living Expenses:   Food Insecurity:   .  Worried About Charity fundraiser in the Last Year:   . Arboriculturist in the Last Year:   Transportation Needs:   . Film/video editor (Medical):   [name] Lack of Transportation (Non-Medical):   Physical Activity:   . Days of Exercise per Week:   . Minutes of Exercise per Session:   Stress:   . Feeling of Stress :   Social Connections:   . Frequency of Communication with Friends and Family:   . Frequency of Social Gatherings with Friends and Family:   . Attends Religious Services:   . Active Member of Clubs or Organizations:   . Attends Archivist Meetings:   [name] Marital Status:    Allergies  Allergen Reactions    . Asa [Aspirin] Rash    High dosage ASA causes break out  . Demerol Itching   Family History  Problem Relation Age of Onset  . Stroke Father   . Colon cancer Neg Hx   . Rectal cancer Neg Hx       Current Outpatient Medications (Cardiovascular):  .  diltiazem (CARDIZEM CD) 360 MG 24 hr capsule, Take 1 capsule (360 mg total) by mouth daily. [name]  losartan-hydrochlorothiazide (HYZAAR) 50-12.5 MG tablet, Take 1 tablet by mouth daily. .  rosuvastatin (CRESTOR) 20 MG tablet, Take 1 tablet (20 mg total) by mouth daily.   Current Outpatient Medications (Respiratory):  .  fexofenadine (ALLEGRA) 180 MG tablet, Take 180 mg by mouth at bedtime.  .  montelukast (SINGULAIR) 10 MG tablet, Take 10 mg by mouth at bedtime.   Current Outpatient Medications (Analgesics):  .  allopurinol (ZYLOPRIM) 300 MG tablet, Take 1 tablet (300 mg total) by mouth daily. [name]  aspirin EC 81 MG tablet, Take 81 mg by mouth daily. .  meloxicam (MOBIC) 15 MG tablet, TAKE 1 TABLET BY MOUTH EVERY DAY   Current Outpatient Medications (Hematological):  [name]  Ferrous Sulfate (IRON) 325 (65 FE) MG TABS, Take 325 mg by mouth daily.    Current Outpatient Medications (Other):  .  amoxicillin (AMOXIL) 500 MG capsule, Take 2,000 mg by mouth once.  .  cholecalciferol (VITAMIN D) 1000 units tablet, Take 1,000 Units by mouth daily. .  Disposable Gloves [facility] SURGICAL GLOVES) MISC, by Does not apply route. .  fish oil-omega-3 fatty acids 1000 MG capsule, Take 3,600 g by mouth daily.  [name]  gabapentin (NEURONTIN) 100 MG capsule, TAKE 2 CAPSULES (200 MG TOTAL) BY MOUTH AT BEDTIME. [name]  Misc Natural Products (TART CHERRY ADVANCED PO), Take 3,600 mg by mouth daily. .  Multiple Vitamin (MULTIVITAMIN) tablet, Take 1 tablet by mouth daily.   .  TURMERIC PO, Take 2,000 mg by mouth daily. .  valACYclovir (VALTREX) 1000 MG tablet, TAKE 1 TABLET BY MOUTH EVERY DAY  Current Facility-Administered Medications (Other):  .  0.9 %  sodium chloride  infusion   Reviewed prior external information including notes and imaging from  primary care provider As well as notes that were available from care everywhere and other healthcare systems.  Past medical history, social, surgical and family history all reviewed in electronic medical record.  [name]   Review of Systems:  [name], visual changes, nausea, vomiting, diarrhea, constipation, dizziness, abdominal pain, skin rash, fevers, chills, night sweats, weight loss, swollen lymph nodes, body aches, joint swelling, chest pain, shortness of breath, mood changes. POSITIVE muscle aches  Objective  Blood pressure 118/78, pulse 71,  height 6' (1.829 m), weight 187 lb (84.8 kg), SpO2 96 %.   General: [name], dressed appropriately.  HEENT: Pupils equal, extraocular movements intact  Respiratory: Patient's speak in full sentences and does not appear short of breath  Cardiovascular: [name], non tender, [name]  Neuro: Cranial nerves II through XII are intact, neurovascularly intact in all extremities with 2+ DTRs and 2+ pulses.  Gait normal with good balance and coordination.  MSK: Mild arthritic changes of multiple joints knee replacement noted of the left knee.  Patient has difficulty squatting with his back in a significant flexed position.  Tightness of the hip flexors bilaterally.  Neck exam tightness with sidebending bilaterally.  [name] crepitus.  Negative Spurling's.  Osteopathic findings C2 flexed rotated and side bent right C4 flexed rotated and side bent left T3 extended rotated and side bent right inhaled third rib T8 extended rotated and side bent left L5 flexed rotated and side bent left Sacrum right on right     Impression and Recommendations:     The above documentation has been reviewed and is accurate and complete Lyndal Pulley, DO       Note: This dictation was prepared with Dragon dictation along with smaller phrase technology. Any transcriptional errors that result from this process are unintentional.

## 2020-05-31 ENCOUNTER — Other Ambulatory Visit: Payer: Self-pay | Admitting: Cardiology

## 2020-06-05 ENCOUNTER — Other Ambulatory Visit: Payer: Self-pay | Admitting: Family Medicine

## 2020-06-05 NOTE — Telephone Encounter (Signed)
Ok to refill? Pt just had cpe in may

## 2020-06-25 ENCOUNTER — Other Ambulatory Visit: Payer: Self-pay | Admitting: Cardiology

## 2020-06-25 DIAGNOSIS — R931 Abnormal findings on diagnostic imaging of heart and coronary circulation: Secondary | ICD-10-CM

## 2020-06-25 NOTE — Telephone Encounter (Signed)
Rx has been sent to the pharmacy electronically. ° °

## 2020-06-28 ENCOUNTER — Other Ambulatory Visit: Payer: Self-pay | Admitting: Family Medicine

## 2020-07-06 NOTE — Progress Notes (Signed)
[place] [address] [place] Phone: [phone] Subjective:   I Matthew [name] am serving as a Education administrator for Dr. Hulan Saas.  This visit occurred during the SARS-CoV-2 public health emergency.  Safety protocols were in place, including screening questions prior to the visit, additional usage of staff PPE, and extensive cleaning of exam room while observing appropriate contact time as indicated for disinfecting solutions.   I'm seeing this patient by the request  of:  Denita Lung, MD  CC: Neck pain follow-up  [id number]  ILAI HILLER is a 64 y.o. male coming in with complaint of back and neck pain Patient states he is doing well. Tight today.  States that it seems to be more of the upper back.  Patient has been doing more golf to a certain degree and is looking for a trainer.  Patient would like to workout on a more regular basis.  Medications patient has been prescribed: Allopurinol, gabapentin Taking: Yes      Seen [facility] recently for f/u of med epi and ucl tear- ocntinue conservative therapy was agreed with patient having minimal symptoms.    Reviewed prior external information including notes and imaging from previsou exam, outside providers and external EMR if available.   As well as notes that were available from care everywhere and other healthcare systems.  Past medical history, social, surgical and family history all reviewed in electronic medical record.  No pertanent information unless stated regarding to the chief complaint.   Past Medical History:  Diagnosis Date  . Allergy    RHINITIS  . Anemia   . Arthritis   . Bursitis of left foot 11/11/2016  . Colonic polyp 09/2006   colonoscopy  . Ganglion 11/02/2017  . GERD (gastroesophageal reflux disease)   . Golfer's elbow, right 09/25/2019  . Gout   . Hamstring tendinitis of left thigh 07/26/2016  . Heart beat abnormality 12/14/2017  . Herpes genitalis in men  02/14/2014  . Hypertension   . Osteitis pubis ([place]) 01/27/2016  . Primary osteoarthritis of left knee 11/06/2017  . Right groin pain 11/08/2018  . SOB (shortness of breath)    per pt, may be coming from new medication  . Subluxation of distal radial-ulnar joint 12/03/2019  . Subluxation of tendon of long head of biceps 10/15/2016   Injected shoulder 12/19/2016  . Subungual hematoma of foot, initial encounter 05/29/2018  . SVT (supraventricular tachycardia) ([place]) 03/09/2020    Allergies  Allergen Reactions  . Asa [Aspirin] Rash    High dosage ASA causes break out  . Demerol Itching     Review of Systems:  No headache, visual changes, nausea, vomiting, diarrhea, constipation, dizziness, abdominal pain, skin rash, fevers, chills, night sweats, weight loss, swollen lymph nodes, body aches, joint swelling, chest pain, shortness of breath, mood changes. POSITIVE muscle aches  Objective  There were no vitals taken for this visit.   General: No apparent distress alert and oriented x3 mood and affect normal, dressed appropriately.  HEENT: Pupils equal, extraocular movements intact  Respiratory: Patient's speak in full sentences and does not appear short of breath  Cardiovascular: No lower extremity edema, non tender, no erythema  Neuro: Cranial nerves II through XII are intact, neurovascularly intact in all extremities with 2+ DTRs and 2+ pulses.  Gait normal with good balance and coordination.  MSK:  Non tender with full range of motion and good stability and symmetric strength and tone of shoulders, elbows,  wrist, hip, knee and ankles bilaterally.  Back -mild tightness of the lower back.  Patient does have pain in the thoracolumbar junction right greater than left, negative straight leg test.  Neurovascularly intact distally.  Osteopathic findings  C2 flexed rotated and side bent right C6 flexed rotated and side bent left T3 extended rotated and side bent right inhaled rib T9 extended rotated  and side bent left        Assessment and Plan:    Nonallopathic problems  Decision today to treat with OMT was based on Physical Exam  After verbal consent patient was treated with HVLA, ME, FPR techniques in cervical, rib, thoracic areas  Patient tolerated the procedure well with improvement in symptoms  Patient given exercises, stretches and lifestyle modifications  See medications in patient instructions if given  Patient will follow up in 4-8 weeks      The above documentation has been reviewed and is accurate and complete Lyndal Pulley, DO       Note: This dictation was prepared with Dragon dictation along with smaller phrase technology. Any transcriptional errors that result from this process are unintentional.

## 2020-07-07 ENCOUNTER — Encounter: Payer: Self-pay | Admitting: Family Medicine

## 2020-07-07 ENCOUNTER — Ambulatory Visit (INDEPENDENT_AMBULATORY_CARE_PROVIDER_SITE_OTHER): Payer: 59 | Admitting: Family Medicine

## 2020-07-07 ENCOUNTER — Other Ambulatory Visit: Payer: Self-pay

## 2020-07-07 VITALS — BP 130/80 | HR 70 | Ht 72.0 in | Wt 193.0 lb

## 2020-07-07 DIAGNOSIS — M542 Cervicalgia: Secondary | ICD-10-CM

## 2020-07-07 DIAGNOSIS — M999 Biomechanical lesion, unspecified: Secondary | ICD-10-CM | POA: Diagnosis not present

## 2020-07-07 NOTE — Assessment & Plan Note (Signed)
Multifactorial, discussed with patient my posture ergonomics, continue the same medications including the allopurinol, iron supplementation, over-the-counter medications like vitamin D that can also be helpful.  Patient is making progress.  Follow-up with me again 6 to 8 weeks

## 2020-07-19 ENCOUNTER — Other Ambulatory Visit: Payer: Self-pay | Admitting: Family Medicine

## 2020-07-24 ENCOUNTER — Ambulatory Visit: Payer: 59 | Admitting: Cardiology

## 2020-08-17 NOTE — Progress Notes (Signed)
[employer] [address] [place] Phone: [phone] Subjective:   I [name] am serving as a Education administrator for Dr. Hulan Saas.  This visit occurred during the SARS-CoV-2 public health emergency.  Safety protocols were in place, including screening questions prior to the visit, additional usage of staff PPE, and extensive cleaning of exam room while observing appropriate contact time as indicated for disinfecting solutions.   I'm seeing this patient by the request  of:  Patient, No Pcp Per  CC: Back and neck pain follow-up  [id number]  LORAS GRIESHOP is a 64 y.o. male coming in with complaint of back and neck pain. OMT 07/07/2020. Patient states he has continuous draining. Chest and throat pain. Neck is still sore and his back is doing well.   Medications patient has been prescribed: Gabapentin, Singulair  Taking: Yes         Reviewed prior external information including notes and imaging from previsou exam, outside providers and external EMR if available.   As well as notes that were available from care everywhere and other healthcare systems.  Past medical history, social, surgical and family history all reviewed in electronic medical record.  No pertanent information unless stated regarding to the chief complaint.   Past Medical History:  Diagnosis Date  . Allergy    RHINITIS  . Anemia   . Arthritis   . Bursitis of left foot 11/11/2016  . Colonic polyp 09/2006   colonoscopy  . Ganglion 11/02/2017  . GERD (gastroesophageal reflux disease)   . Golfer's elbow, right 09/25/2019  . Gout   . Hamstring tendinitis of left thigh 07/26/2016  . Heart beat abnormality 12/14/2017  . Herpes genitalis in men 02/14/2014  . Hypertension   . Osteitis pubis ([place]) 01/27/2016  . Primary osteoarthritis of left knee 11/06/2017  . Right groin pain 11/08/2018  . SOB (shortness of breath)    per pt, may be coming from new medication  .  Subluxation of distal radial-ulnar joint 12/03/2019  . Subluxation of tendon of long head of biceps 10/15/2016   Injected shoulder 12/19/2016  . Subungual hematoma of foot, initial encounter 05/29/2018  . SVT (supraventricular tachycardia) ([place]) 03/09/2020    Allergies  Allergen Reactions  . Asa [Aspirin] Rash    High dosage ASA causes break out  . Demerol Itching     Review of Systems:  No headache, visual changes, nausea, vomiting, diarrhea, constipation, dizziness, abdominal pain, skin rash, fevers, chills, night sweats, weight loss, swollen lymph nodes, body aches, joint swelling, chest pain, shortness of breath, mood changes. POSITIVE muscle aches  Objective  Blood pressure 100/78, pulse 61, height 6' (1.829 m), weight 193 lb (87.5 kg), SpO2 98 %.   General: No apparent distress alert and oriented x3 mood and affect normal, dressed appropriately.  HEENT: Pupils equal, extraocular movements intact patient does have some redness of the throat with some anterior cervical lymphadenopathy Respiratory: Patient's speak in full sentences and does not appear short of breath  Cardiovascular: No lower extremity edema, non tender, no erythema  Gait normal with good balance and coordination.  Neck exam shows the patient does have some tightness noted.  Some loss of lordosis of the neck.  Mild crepitus with range of motion.  Tightness in the parascapular region bilaterally.  Osteopathic findings  C4 flexed rotated and side bent right T7 extended rotated and side bent right inhaled rib       Assessment and Plan:  Sinusitis, chronic Patient has had recurrent sinusitis with postnasal drip previously.  Augmentin given.  Hopefully this will be beneficial.  Patient is going to be following up with an ENT and [facility] for patient gets most of his care done at this time.  If any worsening shortness of breath or chest pain patient is to follow-up with his primary care provider.  Follow-up again 4 to 8  weeks  Golfer's elbow, right Mild discomfort patient states today.  Would not want to change management does not want surgical intervention.  Just discussed lifting mechanics.  Neck tightness Mild underlying arthritic changes.  Patient responded well to manipulation.  Patient has gabapentin takes occasionally.  Follow-up again in 4 to 8 weeks    Nonallopathic problems  Decision today to treat with OMT was based on Physical Exam  After verbal consent patient was treated with HVLA, ME, FPR techniques in cervical, rib, thoracic,areas  Patient tolerated the procedure well with improvement in symptoms  Patient given exercises, stretches and lifestyle modifications  See medications in patient instructions if given  Patient will follow up in 4-8 weeks      The above documentation has been reviewed and is accurate and complete Lyndal Pulley, DO      Note: This dictation was prepared with Dragon dictation along with smaller phrase technology. Any transcriptional errors that result from this process are unintentional.

## 2020-08-18 ENCOUNTER — Ambulatory Visit (INDEPENDENT_AMBULATORY_CARE_PROVIDER_SITE_OTHER): Payer: 59 | Admitting: Family Medicine

## 2020-08-18 ENCOUNTER — Encounter: Payer: Self-pay | Admitting: Family Medicine

## 2020-08-18 ENCOUNTER — Other Ambulatory Visit: Payer: Self-pay

## 2020-08-18 VITALS — BP 100/78 | HR 61 | Ht 72.0 in | Wt 193.0 lb

## 2020-08-18 DIAGNOSIS — M7701 Medial epicondylitis, right elbow: Secondary | ICD-10-CM

## 2020-08-18 DIAGNOSIS — M999 Biomechanical lesion, unspecified: Secondary | ICD-10-CM

## 2020-08-18 DIAGNOSIS — R29898 Other symptoms and signs involving the musculoskeletal system: Secondary | ICD-10-CM | POA: Diagnosis not present

## 2020-08-18 DIAGNOSIS — J329 Chronic sinusitis, unspecified: Secondary | ICD-10-CM | POA: Insufficient documentation

## 2020-08-18 DIAGNOSIS — J324 Chronic pansinusitis: Secondary | ICD-10-CM

## 2020-08-18 MED ORDER — AMOXICILLIN-POT CLAVULANATE 875-125 MG PO TABS: 1.0000 | ORAL_TABLET | Freq: Two times a day (BID) | ORAL | 0 refills | Status: DC

## 2020-08-18 NOTE — Assessment & Plan Note (Signed)
Mild discomfort patient states today.  Would not want to change management does not want surgical intervention.  Just discussed lifting mechanics.

## 2020-08-18 NOTE — Assessment & Plan Note (Signed)
Patient has had recurrent sinusitis with postnasal drip previously.  Augmentin given.  Hopefully this will be beneficial.  Patient is going to be following up with an ENT and [facility] for patient gets most of his care done at this time.  If any worsening shortness of breath or chest pain patient is to follow-up with his primary care provider.  Follow-up again 4 to 8 weeks

## 2020-08-18 NOTE — Assessment & Plan Note (Signed)
Mild underlying arthritic changes.  Patient responded well to manipulation.  Patient has gabapentin takes occasionally.  Follow-up again in 4 to 8 weeks

## 2020-09-25 ENCOUNTER — Other Ambulatory Visit: Payer: Self-pay | Admitting: Family Medicine

## 2020-09-28 NOTE — Progress Notes (Signed)
[place] Phone: [phone] Subjective:   [name], am serving as a scribe for Dr. Hulan Saas. This visit occurred during the SARS-CoV-2 public health emergency.  Safety protocols were in place, including screening questions prior to the visit, additional usage of staff PPE, and extensive cleaning of exam room while observing appropriate contact time as indicated for disinfecting solutions.   I'm seeing this patient by the request  of:  Patient, No Pcp Per  CC: Neck and back pain follow-up continued cough  [id number]  Matthew [name] is a 64 y.o. male coming in with complaint of back and neck pain. OMT 08/18/2020. Patient states that his back pain has been fine since last visit.   Patient is here for coughing. Has been on Nexium which has calmed sore throat. Feels he is compensating in back of throat as he has a tickle in back of throat for one week.  Patient discontinued the Singulair because it was drying him out.           Reviewed prior external information including notes and imaging from previsou exam, outside providers and external EMR if available.   As well as notes that were available from care everywhere and other healthcare systems.  Past medical history, social, surgical and family history all reviewed in electronic medical record.  No pertanent information unless stated regarding to the chief complaint.   Past Medical History:  Diagnosis Date  . Allergy    RHINITIS  . Anemia   . Arthritis   . Bursitis of left foot 11/11/2016  . Colonic polyp 09/2006   colonoscopy  . Ganglion 11/02/2017  . GERD (gastroesophageal reflux disease)   . Golfer's elbow, right 09/25/2019  . Gout   . Hamstring tendinitis of left thigh 07/26/2016  . Heart beat abnormality 12/14/2017  . Herpes genitalis in men 02/14/2014  . Hypertension   . Osteitis pubis ([place]) 01/27/2016  . Primary osteoarthritis of  left knee 11/06/2017  . Right groin pain 11/08/2018  . SOB (shortness of breath)    per pt, may be coming from new medication  . Subluxation of distal radial-ulnar joint 12/03/2019  . Subluxation of tendon of long head of biceps 10/15/2016   Injected shoulder 12/19/2016  . Subungual hematoma of foot, initial encounter 05/29/2018  . SVT (supraventricular tachycardia) ([place]) 03/09/2020    Allergies  Allergen Reactions  . Asa [Aspirin] Rash    High dosage ASA causes break out  . Demerol Itching     Review of Systems:  No headache, visual changes, nausea, vomiting, diarrhea, constipation, dizziness, abdominal pain, skin rash, fevers, chills, night sweats, weight loss, swollen lymph nodes, body aches, joint swelling, chest pain, shortness of breath, mood changes. POSITIVE muscle aches  Objective  Blood pressure 138/88, pulse 77, height 6' (1.829 m), weight 195 lb (88.5 kg).   General: No apparent distress alert and oriented x3 mood and affect normal, dressed appropriately.  HEENT: Pupils equal, extraocular movements intact  Respiratory: Patient's speak in full sentences and does not appear short of breath  Cardiovascular: No lower extremity edema, non tender, no erythema  Neuro: Cranial nerves II through XII are intact, neurovascularly intact in all extremities with 2+ DTRs and 2+ pulses.  Gait normal with good balance and coordination.  MSK:  Non tender with full range of motion and good stability and symmetric strength and tone of shoulders, elbows, wrist, hip, knee and ankles bilaterally.  Back - Normal skin, Spine with normal alignment and no deformity.  No tenderness to vertebral process palpation.  Paraspinous muscles are not tender and without spasm.   Range of motion is full at neck and lumbar sacral regions  Osteopathic findings  C2 flexed rotated and side bent right C6 flexed rotated and side bent left T3 extended rotated and side bent right inhaled rib       Assessment and  Plan:  Neck pain Chronic, and does have some muscle imbalances.  Continues to have some arthritic changes as well.  Gabapentin encouraged 1 more pain.  Has had meloxicam and allopurinol.  Discussed posture and ergonomics.  Follow-up again 4 to 8 weeks  GERD (gastroesophageal reflux disease) Been treated recently and has been on Nexium.  Patient is seeing a ENT doctor as well for postnasal drip and may need further treatment options.  Allergic rhinitis Follow-up with ENT continue Flonase and consider half dose of Zyrtec at night    Nonallopathic problems  Decision today to treat with OMT was based on Physical Exam  After verbal consent patient was treated with HVLA, ME, FPR techniques in cervical, rib, thoracic,  areas  Patient tolerated the procedure well with improvement in symptoms  Patient given exercises, stretches and lifestyle modifications  See medications in patient instructions if given  Patient will follow up in 4-8 weeks      The above documentation has been reviewed and is accurate and complete Lyndal Pulley, DO       Note: This dictation was prepared with Dragon dictation along with smaller phrase technology. Any transcriptional errors that result from this process are unintentional.

## 2020-09-29 ENCOUNTER — Encounter: Payer: Self-pay | Admitting: Family Medicine

## 2020-09-29 ENCOUNTER — Ambulatory Visit (INDEPENDENT_AMBULATORY_CARE_PROVIDER_SITE_OTHER): Payer: 59 | Admitting: Family Medicine

## 2020-09-29 ENCOUNTER — Other Ambulatory Visit: Payer: Self-pay

## 2020-09-29 VITALS — BP 138/88 | HR 77 | Ht 72.0 in | Wt 195.0 lb

## 2020-09-29 DIAGNOSIS — J301 Allergic rhinitis due to pollen: Secondary | ICD-10-CM | POA: Diagnosis not present

## 2020-09-29 DIAGNOSIS — M542 Cervicalgia: Secondary | ICD-10-CM

## 2020-09-29 DIAGNOSIS — K219 Gastro-esophageal reflux disease without esophagitis: Secondary | ICD-10-CM

## 2020-09-29 DIAGNOSIS — M999 Biomechanical lesion, unspecified: Secondary | ICD-10-CM | POA: Diagnosis not present

## 2020-09-29 NOTE — Assessment & Plan Note (Signed)
Chronic, and does have some muscle imbalances.  Continues to have some arthritic changes as well.  Gabapentin encouraged 1 more pain.  Has had meloxicam and allopurinol.  Discussed posture and ergonomics.  Follow-up again 4 to 8 weeks

## 2020-09-29 NOTE — Assessment & Plan Note (Signed)
Follow-up with ENT continue Flonase and consider half dose of Zyrtec at night

## 2020-09-29 NOTE — Assessment & Plan Note (Signed)
Been treated recently and has been on Nexium.  Patient is seeing a ENT doctor as well for postnasal drip and may need further treatment options.

## 2020-11-11 NOTE — Progress Notes (Signed)
[place] Phone: [phone] Subjective:   Matthew [name], am serving as a scribe for Dr. Hulan Saas. This visit occurred during the SARS-CoV-2 public health emergency.  Safety protocols were in place, including screening questions prior to the visit, additional usage of staff PPE, and extensive cleaning of exam room while observing appropriate contact time as indicated for disinfecting solutions.   I'm seeing this patient by the request  of:  Patient, [name] Pcp Per  CC: Cough follow-up, back pain follow-up new finger pain  PIR:[id number]  Matthew [name] is a 65 y.o. male coming in with complaint of back and neck pain. OMT 09/29/2020. Patient states that his back is doing fine since last visit.  Very minimal discomfort.  Also having trigger finger in middle finger right hand for one month. First time this has occurred.  He does not remember any true injury.  States very mildly uncomfortable.  Tried anything for  Continues to have chronic cough patient was seen by ENT.  ENT did get a CT scan that was unremarkable for any sinusitis.  Continues to have the cough that can keep him up at night.  Patient does notice that taking the omeprazole twice a day has been beneficial and did help initially and still better than what it was but still not completely gone.  Wanting to know what else he can do.        Reviewed prior external information including notes and imaging from previsou exam, outside providers and external EMR if available.   As well as notes that were available from care everywhere and other healthcare systems.  Past medical history, social, surgical and family history all reviewed in electronic medical record.  [name]   Past Medical History:  Diagnosis Date  . Allergy    RHINITIS  . Anemia   . Arthritis   . Bursitis of left foot  11/11/2016  . Colonic polyp 09/2006   colonoscopy  . Ganglion 11/02/2017  . GERD (gastroesophageal reflux disease)   . Golfer's elbow, right 09/25/2019  . Gout   . Hamstring tendinitis of left thigh 07/26/2016  . Heart beat abnormality 12/14/2017  . Herpes genitalis in men 02/14/2014  . Hypertension   . Osteitis pubis ([place]) 01/27/2016  . Primary osteoarthritis of left knee 11/06/2017  . Right groin pain 11/08/2018  . SOB (shortness of breath)    per pt, may be coming from new medication  . Subluxation of distal radial-ulnar joint 12/03/2019  . Subluxation of tendon of long head of biceps 10/15/2016   Injected shoulder 12/19/2016  . Subungual hematoma of foot, initial encounter 05/29/2018  . SVT (supraventricular tachycardia) ([place]) 03/09/2020    Allergies  Allergen Reactions  . Asa [Aspirin] Rash    High dosage ASA causes break out  . Demerol Itching     Review of Systems:  [name], visual changes, nausea, vomiting, diarrhea, constipation, dizziness, abdominal pain, skin rash, fevers, chills, night sweats, weight loss, swollen lymph nodes, body aches, joint swelling, chest pain, shortness of breath, mood changes. POSITIVE muscle aches  Objective  Blood pressure 120/84, pulse 74, height 6' (1.829 m), weight 194 lb (88 kg), SpO2 98 %.   General: [name], dressed appropriately.  HEENT: Pupils equal, extraocular movements intact  Respiratory: Patient's speak in full sentences and does not appear short  of breath  Cardiovascular: [name], non tender, [name]  Gait normal with good balance and coordination.  MSK: Right hand exam shows the patient does have a trigger nodule noted at the A2 [name] of the middle finger.  [name] true triggering occurring [name] at this point.  Mildly tender at this time. Back - Normal skin, mild tightness noted overall.  Nothing severe       Assessment and Plan:         The above  documentation has been reviewed and is accurate and complete Matthew Pulley, DO       Note: This dictation was prepared with Dragon dictation along with smaller phrase technology. Any transcriptional errors that result from this process are unintentional.

## 2020-11-12 ENCOUNTER — Encounter: Payer: Self-pay | Admitting: Family Medicine

## 2020-11-12 ENCOUNTER — Ambulatory Visit: Payer: 59 | Admitting: Family Medicine

## 2020-11-12 ENCOUNTER — Other Ambulatory Visit: Payer: Self-pay

## 2020-11-12 VITALS — BP 120/84 | HR 74 | Ht 72.0 in | Wt 194.0 lb

## 2020-11-12 DIAGNOSIS — K219 Gastro-esophageal reflux disease without esophagitis: Secondary | ICD-10-CM

## 2020-11-12 DIAGNOSIS — M65331 Trigger finger, right middle finger: Secondary | ICD-10-CM | POA: Diagnosis not present

## 2020-11-12 NOTE — Assessment & Plan Note (Signed)
Patient does have more of a trigger finger.

## 2020-11-12 NOTE — Assessment & Plan Note (Signed)
Patient continues to have difficulty at this time.  Patient has seen ENT and CT of the sinuses was completely unremarkable. Concerned that the gastroesophageal reflux disease is causing his chronic cough.  Patient does state that 2 time dosing of omeprazole has been beneficial but is still not completely gone.  Will refer patient to gastroenterology for further evaluation.  If work-up there is unremarkable then consider pulmonology discussed teaspoon of honey before bed.

## 2020-11-12 NOTE — Patient Instructions (Signed)
Brace at night for 2 weeks Referral to GI for chronic cough Teaspoon of honey before bed Heat and massage for trigger nodule See me again in 6 weeks

## 2020-11-17 ENCOUNTER — Telehealth: Payer: Self-pay | Admitting: Internal Medicine

## 2020-11-17 NOTE — Telephone Encounter (Signed)
Dr Tamala Julian recommended that this patient see Dr Quay Burow for Primary Care. Per Dr Tamala Julian, Dr Quay Burow is aware and is okay with scheduling.  Can you help get him set up?

## 2020-11-17 NOTE — Progress Notes (Deleted)
Can you help schedule this patient with Dr Quay Burow please?

## 2020-11-18 NOTE — Telephone Encounter (Signed)
LVM to call back and schedule new patient appt with Dr Quay Burow. Oked by Dr Quay Burow to schedule.

## 2020-12-08 ENCOUNTER — Other Ambulatory Visit: Payer: Self-pay

## 2020-12-08 ENCOUNTER — Ambulatory Visit: Payer: 59 | Admitting: Nurse Practitioner

## 2020-12-08 NOTE — Progress Notes (Signed)
Subjective:    Patient ID: Matthew [name], male    DOB: [birth year], 65 y.o.   MRN: [id number]  HPI  He is here to establish with a new pcp.   The patient is here for follow up of their chronic medical problems.   SVT - sees cardiology at [facility].  Stands quickly, if going up stairs .  Heart racing and SOB.  Comes - last 10-15 sec. Once a week or 2/day  Gout - alloruinrol and tart cheery.    Chronic cough - has seen ENT and has had CT of sinuses.  Was referred to GI to eval for GERD.  Taking PPT BID and it has helped.   St all the time.  Cough - worse at night, ST worse at nihg   gen aches - meloxiam.    Elliptical, stretch, push ups, golf  Medications and allergies reviewed with patient and updated if appropriate.  Patient Active Problem List   Diagnosis Date Noted  . Acquired trigger finger of right middle finger 11/12/2020  . Sinusitis, chronic 08/18/2020  . SVT (supraventricular tachycardia) ([place]) 03/09/2020  . Subluxation of distal radial-ulnar joint 12/03/2019  . Golfer's elbow, right 09/25/2019  . Former smoker 03/07/2019  . Right groin pain 11/08/2018  . Chronic knee pain after total replacement of left knee joint 06/04/2018  . Subungual hematoma of foot, initial encounter 05/29/2018  . Pain and swelling of left knee 05/29/2018  . Heart beat abnormality 12/14/2017  . [name] 11/15/2017  . Primary osteoarthritis of left knee 11/06/2017  . Lipoma of torso 11/02/2017  . Ganglion 11/02/2017  . Sleep deficient 03/22/2017  . Bursitis of left foot 11/11/2016  . Subluxation of tendon of long head of biceps 10/15/2016  . Leg cramps, sleep related 10/15/2016  . Hamstring tendinitis of left thigh 07/26/2016  . Gout 07/26/2016  . Osteitis pubis ([place]) 01/27/2016  . Neck tightness 08/12/2015  . Nonallopathic lesion of cervical region 08/12/2015  . Nonallopathic lesion of thoracic region 08/12/2015  . Nonallopathic lesion-rib cage 08/12/2015  . ACE-inhibitor  cough 08/12/2015  . GERD (gastroesophageal reflux disease) 08/01/2014  . Neck pain 03/03/2014  . Hypertension 02/14/2014  . Allergic rhinitis 02/14/2014  . Herpes genitalis in men 02/14/2014  . Personal history of colonic polyps 05/06/2011  . Pes planus 04/30/2008    Current Outpatient Medications on File Prior to Visit  Medication Sig Dispense Refill  . allopurinol (ZYLOPRIM) 300 MG tablet Take 1 tablet (300 mg total) by mouth daily. 90 tablet 3  . amoxicillin (AMOXIL) 500 MG capsule Take 2,000 mg by mouth once.     [name] aspirin EC 81 MG tablet Take 81 mg by mouth daily.    . cholecalciferol (VITAMIN D) 1000 units tablet Take 1,000 Units by mouth daily.    [name] diltiazem (CARDIZEM CD) 360 MG 24 hr capsule Take 1 capsule (360 mg total) by mouth daily. 90 capsule 3  . esomeprazole (NEXIUM) 20 MG capsule Take by mouth.    . Ferrous Sulfate (IRON) 325 (65 FE) MG TABS Take 325 mg by mouth daily.     . fexofenadine (ALLEGRA) 180 MG tablet Take 180 mg by mouth at bedtime.     . fish oil-omega-3 fatty acids 1000 MG capsule Take 3,600 g by mouth daily.    . fluticasone (FLONASE) 50 MCG/ACT nasal spray Place into the nose.    . gabapentin (NEURONTIN) 100 MG capsule TAKE 2 CAPSULES (200 MG TOTAL) BY MOUTH AT  BEDTIME. 180 capsule 0  . losartan-hydrochlorothiazide (HYZAAR) 50-12.5 MG tablet TAKE 1 TABLET BY MOUTH EVERY DAY 90 tablet 3  . meloxicam (MOBIC) 15 MG tablet TAKE 1 TABLET BY MOUTH EVERY DAY 30 tablet 2  . Misc Natural Products (TART CHERRY ADVANCED PO) Take 3,600 mg by mouth daily.    . Multiple Vitamin (MULTIVITAMIN) tablet Take 1 tablet by mouth daily.    . rosuvastatin (CRESTOR) 20 MG tablet TAKE 1 TABLET BY MOUTH EVERY DAY 90 tablet 2  . triamcinolone (KENALOG) 0.1 % paste SMARTSIG:1 TO TEETH Every Night    . valACYclovir (VALTREX) 500 MG tablet Take 500 mg by mouth daily.     No current facility-administered medications on file prior to visit.    Past Medical History:  Diagnosis Date   . Allergy    RHINITIS  . Anemia   . Arthritis   . Bursitis of left foot 11/11/2016  . Colonic polyp 09/2006   colonoscopy  . Ganglion 11/02/2017  . GERD (gastroesophageal reflux disease)   . Golfer's elbow, right 09/25/2019  . Gout   . Hamstring tendinitis of left thigh 07/26/2016  . Heart beat abnormality 12/14/2017  . Herpes genitalis in men 02/14/2014  . Hypertension   . Osteitis pubis ([place]) 01/27/2016  . Primary osteoarthritis of left knee 11/06/2017  . Right groin pain 11/08/2018  . SOB (shortness of breath)    per pt, may be coming from new medication  . Subluxation of distal radial-ulnar joint 12/03/2019  . Subluxation of tendon of long head of biceps 10/15/2016   Injected shoulder 12/19/2016  . Subungual hematoma of foot, initial encounter 05/29/2018  . SVT (supraventricular tachycardia) ([place]) 03/09/2020    Past Surgical History:  Procedure Laterality Date  . COLONOSCOPY  09/2006  . KNEE ARTHROSCOPY Left 2011  . [place] SURGERY  1990  . SVT ABLATION N/A 01/09/2020   Procedure: SVT ABLATION;  Surgeon: Constance Haw, MD;  Location: [facility];  Service: Cardiovascular;  Laterality: N/A;  . TOTAL KNEE ARTHROPLASTY Left 11/06/2017   Procedure: TOTAL KNEE ARTHROPLASTY;  Surgeon: Dorna Leitz, MD;  Location: [facility];  Service: Orthopedics;  Laterality: Left;    Social History   Socioeconomic History  . Marital status: Married    Spouse name: Not on file  . Number of children: 1  . Years of education: Not on file  . Highest education level: Not on file  Occupational History  . Not on file  Tobacco Use  . Smoking status: Former Smoker    Types: Cigars  . Smokeless tobacco: Never Used  . Tobacco comment: occasional cigar  Vaping Use  . Vaping Use: Never used  Substance and Sexual Activity  . Alcohol use: Yes    Alcohol/week: 3.0 standard drinks    Types: 3 Standard drinks or equivalent per week    Comment: occ 2-3 beers  . Drug use: No  . Sexual activity:  Yes  Other Topics Concern  . Not on file  Social History Narrative  . Not on file   Social Determinants of Health   Financial Resource Strain: Not on file  Food Insecurity: Not on file  Transportation Needs: Not on file  Physical Activity: Not on file  Stress: Not on file  Social Connections: Not on file    Family History  Problem Relation Age of Onset  . Stroke Father   . Colon cancer Neg Hx   . Rectal cancer Neg Hx  Review of Systems  Constitutional: Negative for chills, fatigue and fever.  HENT: Positive for sore throat and trouble swallowing.   Respiratory: Positive for cough. Negative for shortness of breath and wheezing.   Cardiovascular: Positive for palpitations. Negative for chest pain and leg swelling.  Gastrointestinal: Positive for abdominal pain. Negative for constipation, diarrhea and nausea.  Genitourinary: Negative for difficulty urinating, dysuria and hematuria.  Neurological: Negative for dizziness, light-headedness, numbness and headaches.  Psychiatric/Behavioral: Negative for dysphoric mood. The patient is not nervous/anxious.        Objective:   Vitals:   12/09/20 0838  BP: 126/80  Pulse: 64  Temp: 98.6 F (37 C)  SpO2: 97%   BP Readings from Last 3 Encounters:  12/09/20 126/80  11/12/20 120/84  09/29/20 138/88   Wt Readings from Last 3 Encounters:  12/09/20 194 lb (88 kg)  11/12/20 194 lb (88 kg)  09/29/20 195 lb (88.5 kg)   Body mass index is 26.31 kg/m.   Physical Exam    Constitutional: Appears well-developed and well-nourished. No distress.  HENT:  Head: Normocephalic and atraumatic. Ears: Bilateral ear canals and tympanic membranes normal Mouth: Oropharynx moist, no erythema Neck: Neck supple. No tracheal deviation present. No thyromegaly present.  No cervical lymphadenopathy Cardiovascular: Normal rate, regular rhythm and normal heart sounds.   No murmur heard. No carotid bruit .  No edema Pulmonary/Chest: Effort  normal and breath sounds normal. No respiratory distress. No has no wheezes. No rales. Abdomen: Soft, nontender, nondistended Skin: Skin is warm and dry. Not diaphoretic.  Psychiatric: Normal mood and affect. Behavior is normal.      Assessment & Plan:    See Problem List for Assessment and Plan of chronic medical problems.    This visit occurred during the SARS-CoV-2 public health emergency.  Safety protocols were in place, including screening questions prior to the visit, additional usage of staff PPE, and extensive cleaning of exam room while observing appropriate contact time as indicated for disinfecting solutions.

## 2020-12-08 NOTE — Patient Instructions (Addendum)
° ° °  Medications changes include :   Increase nexium 40 mg twice daily

## 2020-12-09 ENCOUNTER — Encounter: Payer: Self-pay | Admitting: Internal Medicine

## 2020-12-09 ENCOUNTER — Ambulatory Visit: Payer: 59 | Admitting: Internal Medicine

## 2020-12-09 ENCOUNTER — Other Ambulatory Visit: Payer: Self-pay

## 2020-12-09 DIAGNOSIS — I10 Essential (primary) hypertension: Secondary | ICD-10-CM | POA: Diagnosis not present

## 2020-12-09 DIAGNOSIS — M109 Gout, unspecified: Secondary | ICD-10-CM

## 2020-12-09 DIAGNOSIS — I471 Supraventricular tachycardia: Secondary | ICD-10-CM

## 2020-12-09 DIAGNOSIS — K2100 Gastro-esophageal reflux disease with esophagitis, without bleeding: Secondary | ICD-10-CM

## 2020-12-09 DIAGNOSIS — A6002 Herpesviral infection of other male genital organs: Secondary | ICD-10-CM

## 2020-12-09 NOTE — Assessment & Plan Note (Signed)
Chronic BP well controlled Continue  diltiazem 360 mg daily and losartan-hydrochlorothiazide 50-12.5

## 2020-12-09 NOTE — Assessment & Plan Note (Signed)
Neck Controlled Continue allopurinol 300 mg daily and tart cherry supplements

## 2020-12-09 NOTE — Assessment & Plan Note (Signed)
Chronic Taking Valtrex 500 mg daily

## 2020-12-09 NOTE — Assessment & Plan Note (Addendum)
Chronic He thinks he is probably had reflux for 15 years or so Has chronic cough and at this point has seen ENT and had a CT of his sinuses and this is not causing the cough-likely related to GERD and possibly nerve related Taking Nexium 20 mg twice daily Has cough, worse at night that is dry, sore throat and has had some trouble swallowing Has an upcoming appointment with GI Increase Nexium to 40 mg twice daily He eats dinner little later 7-730-recommended moving that up a little bit to give at least 3 hours before laying down He is taking meloxicam 15 mg daily at night-advised this could cause stomach irritation-we will await GIs evaluation before advising discontinuation

## 2020-12-09 NOTE — Assessment & Plan Note (Signed)
Chronic, intermittent Following with [facility] cardiology Can have symptoms once a week or twice a day-often will feel short of breath and a sensation of his heart racing going up stairs or up an incline or standing-last 10-15 seconds Fairly well-controlled with diltiazem 360 mg daily

## 2020-12-15 NOTE — Progress Notes (Signed)
[place] Phone: [phone] Subjective:   Matthew [name], am serving as a scribe for Dr. Hulan Saas. This visit occurred during the SARS-CoV-2 public health emergency.  Safety protocols were in place, including screening questions prior to the visit, additional usage of staff PPE, and extensive cleaning of exam room while observing appropriate contact time as indicated for disinfecting solutions.   I'm seeing this patient by the request  of:  [name], MD  CC: Hand pain follow-up, back pain and neck pain follow-up  [id number]  Matthew [name] is a 65 y.o. male coming in with complaint of back and neck pain. OMT 11/12/2020. Also having trigger finger, middle finger, right. Was to brace at night. Also was to be seen by GI for reflux. Patient states that his finger continues to catch. Wears brace occasionally.   Is going to see GI tomorrow.   Would like to get his nose cauterized due to frequent epistaxis in left nostril.             Reviewed prior external information including notes and imaging from previsou exam, outside providers and external EMR if available.   As well as notes that were available from care everywhere and other healthcare systems.  Past medical history, social, surgical and family history all reviewed in electronic medical record.  [name]   Past Medical History:  Diagnosis Date  . Allergy    RHINITIS  . Anemia   . Arthritis   . Bursitis of left foot 11/11/2016  . Colonic polyp 09/2006   colonoscopy  . Ganglion 11/02/2017  . GERD (gastroesophageal reflux disease)   . Golfer's elbow, right 09/25/2019  . Gout   . Hamstring tendinitis of left thigh 07/26/2016  . Heart beat abnormality 12/14/2017  . Herpes genitalis in men 02/14/2014  . Hypertension   . Osteitis pubis ([place]) 01/27/2016  . Primary  osteoarthritis of left knee 11/06/2017  . Right groin pain 11/08/2018  . SOB (shortness of breath)    per pt, may be coming from new medication  . Subluxation of distal radial-ulnar joint 12/03/2019  . Subluxation of tendon of long head of biceps 10/15/2016   Injected shoulder 12/19/2016  . Subungual hematoma of foot, initial encounter 05/29/2018  . SVT (supraventricular tachycardia) ([place]) 03/09/2020    Allergies  Allergen Reactions  . Beta Adrenergic Blockers Other (See Comments)    Fatigue  . Lisinopril Cough  . Asa [Aspirin] Rash    High dosage ASA causes break out  . Demerol Itching     Review of Systems:  [name], visual changes, nausea, vomiting, diarrhea, constipation, dizziness, abdominal pain, skin rash, fevers, chills, night sweats, weight loss, swollen lymph nodes, body aches, joint swelling, chest pain, shortness of breath, mood changes. POSITIVE muscle aches  Objective  Blood pressure 120/82, pulse 80, height 6' (1.829 m), weight 197 lb (89.4 kg), SpO2 97 %.   General: [name], dressed appropriately.  HEENT: Pupils equal, extraocular movements intact  Respiratory: Patient's speak in full sentences and does not appear short of breath  Cardiovascular: [name], non tender, [name]  Gait normal with good balance and coordination.  MSK:  Non tender with full range of motion and good stability and symmetric strength and tone of shoulders, elbows, wrist, hip, knee and ankles bilaterally.  Right hand exam shows the patient does have a trigger nodule noted at the A2 [name] of the middle finger.  Minimally tender to palpation.  Triggering noted today but very mild.  Neurovascularly intact distally. Neck exam very mild loss of lordosis and still some limited sidebending bilaterally.  Negative Spurling's.  Low back exam still some mild tightness but good range of motion.       Assessment and  Plan:       The above documentation has been reviewed and is accurate and complete Matthew Pulley, DO       Note: This dictation was prepared with Dragon dictation along with smaller phrase technology. Any transcriptional errors that result from this process are unintentional.

## 2020-12-16 ENCOUNTER — Encounter: Payer: Self-pay | Admitting: Family Medicine

## 2020-12-16 ENCOUNTER — Encounter: Payer: Self-pay | Admitting: Internal Medicine

## 2020-12-16 ENCOUNTER — Ambulatory Visit (INDEPENDENT_AMBULATORY_CARE_PROVIDER_SITE_OTHER): Payer: 59 | Admitting: Family Medicine

## 2020-12-16 ENCOUNTER — Other Ambulatory Visit: Payer: Self-pay

## 2020-12-16 DIAGNOSIS — M542 Cervicalgia: Secondary | ICD-10-CM | POA: Diagnosis not present

## 2020-12-16 DIAGNOSIS — M65331 Trigger finger, right middle finger: Secondary | ICD-10-CM

## 2020-12-16 NOTE — Assessment & Plan Note (Signed)
Patient has had a trigger finger previously.  Still declined injection.  Patient has been noncompliant with the bracing.  Discussed heat and massage and bracing at night.  Worsening pain will consider injection and follow-up in 6 weeks

## 2020-12-16 NOTE — Assessment & Plan Note (Signed)
Stable at the moment.  No significant change in management.  Did not need to do any significant osteopathic manipulation today.

## 2020-12-16 NOTE — Patient Instructions (Addendum)
Schedule with Dr. Quay Burow for nose Heat and massage See me again in 6 weeks

## 2020-12-17 ENCOUNTER — Encounter: Payer: Self-pay | Admitting: Physician Assistant

## 2020-12-17 ENCOUNTER — Telehealth: Payer: Self-pay | Admitting: Physician Assistant

## 2020-12-17 ENCOUNTER — Ambulatory Visit: Payer: 59 | Admitting: Physician Assistant

## 2020-12-17 VITALS — BP 130/74 | HR 80 | Ht 72.0 in | Wt 196.8 lb

## 2020-12-17 DIAGNOSIS — R059 Cough, unspecified: Secondary | ICD-10-CM | POA: Diagnosis not present

## 2020-12-17 DIAGNOSIS — K219 Gastro-esophageal reflux disease without esophagitis: Secondary | ICD-10-CM | POA: Diagnosis not present

## 2020-12-17 DIAGNOSIS — J029 Acute pharyngitis, unspecified: Secondary | ICD-10-CM | POA: Diagnosis not present

## 2020-12-17 MED ORDER — SUCRALFATE 1 G PO TABS: 1.0000 g | ORAL_TABLET | Freq: Four times a day (QID) | ORAL | 3 refills | Status: DC

## 2020-12-17 MED ORDER — AMBULATORY NON FORMULARY MEDICATION: 0 refills | Status: DC

## 2020-12-17 MED ORDER — ESOMEPRAZOLE MAGNESIUM 40 MG PO CPDR: 40.0000 mg | DELAYED_RELEASE_CAPSULE | Freq: Two times a day (BID) | ORAL | 3 refills | Status: DC

## 2020-12-17 NOTE — Patient Instructions (Signed)
If you are age 65 or older, your body mass index should be between 23-30. Your Body mass index is 26.69 kg/m. If this is out of the aforementioned range listed, please consider follow up with your Primary Care Provider.  If you are age 32 or younger, your body mass index should be between 19-25. Your Body mass index is 26.69 kg/m. If this is out of the aformentioned range listed, please consider follow up with your Primary Care Provider.   We have sent the following medications to your pharmacy for you to pick up at your convenience: GI cocktail and carafate. Nexium 40 mg .  Thank you for choosing me and [facility].  Ellouise Newer , PA-C

## 2020-12-17 NOTE — Telephone Encounter (Signed)
Faxed prescription for GI cock tail to patients pharmacy.

## 2020-12-17 NOTE — Progress Notes (Signed)
Assessment and plan reviewed 

## 2020-12-17 NOTE — Progress Notes (Signed)
Chief Complaint: GERD and cough  HPI:    [name] is a 65 year old male with a past medical history as listed below, known to Dr. Henrene Pastor from a previous colonoscopy, who presents clinic today with a complaint of reflux and cough.    01/31/2018 colonoscopy done for history of prior polyps. With multiple small and large mouth diverticula in the left colon and in the right colon, internal hemorrhoids and otherwise normal. Repeat recommended in 10 years.    Today, the patient describes that for the past 6 months he has had reflux and a constantly sore throat which seems worse at night when he lays down.  Then over the past couple of months he has developed a cough which is also worse at night.  Tells me that he was eventually started on Nexium 20 mg twice a day by his ENT and his reflux symptoms calmed down but he continues with a sore throat and coughing.  The ENT evaluated him and did not think there was any postnasal drip causing his cough.  He also had evaluation by cardiology who did not think it was cardiac.  He was told to see [place].  Last week his Nexium was increased to 40 mg twice daily which again has helped his reflux but he has a constantly sore throat and coughs at night.  Tells me he has now been kicked out of his room at night and would like to figure out what is going on.    Denies fever, chills, weight loss, blood in his stool, change in bowel habits or abdominal pain.  Past Medical History:  Diagnosis Date  . Allergy    RHINITIS  . Anemia   . Arthritis   . Bursitis of left foot 11/11/2016  . Colonic polyp 09/2006   colonoscopy  . Ganglion 11/02/2017  . GERD (gastroesophageal reflux disease)   . Golfer's elbow, right 09/25/2019  . Gout   . Hamstring tendinitis of left thigh 07/26/2016  . Heart beat abnormality 12/14/2017  . Herpes genitalis in men 02/14/2014  . Hypertension   . Osteitis pubis ([place]) 01/27/2016  . Primary osteoarthritis of left knee 11/06/2017  . Right groin pain  11/08/2018  . SOB (shortness of breath)    per pt, may be coming from new medication  . Subluxation of distal radial-ulnar joint 12/03/2019  . Subluxation of tendon of long head of biceps 10/15/2016   Injected shoulder 12/19/2016  . Subungual hematoma of foot, initial encounter 05/29/2018  . SVT (supraventricular tachycardia) ([place]) 03/09/2020    Past Surgical History:  Procedure Laterality Date  . COLONOSCOPY  09/2006  . KNEE ARTHROSCOPY Left 2011  . [place] SURGERY  1990  . SVT ABLATION N/A 01/09/2020   Procedure: SVT ABLATION;  Surgeon: Constance Haw, MD;  Location: [facility];  Service: Cardiovascular;  Laterality: N/A;  . TOTAL KNEE ARTHROPLASTY Left 11/06/2017   Procedure: TOTAL KNEE ARTHROPLASTY;  Surgeon: Dorna Leitz, MD;  Location: [facility];  Service: Orthopedics;  Laterality: Left;    Current Outpatient Medications  Medication Sig Dispense Refill  . allopurinol (ZYLOPRIM) 300 MG tablet Take 1 tablet (300 mg total) by mouth daily. 90 tablet 3  . amoxicillin (AMOXIL) 500 MG capsule Take 2,000 mg by mouth once.     [name] aspirin EC 81 MG tablet Take 81 mg by mouth daily.    . cholecalciferol (VITAMIN D) 1000 units tablet Take 1,000 Units by mouth daily.    [name] diltiazem (  CARDIZEM CD) 360 MG 24 hr capsule Take 1 capsule (360 mg total) by mouth daily. 90 capsule 3  . esomeprazole (NEXIUM) 20 MG capsule Take 40 mg by mouth 2 (two) times daily.    . Ferrous Sulfate (IRON) 325 (65 FE) MG TABS Take 325 mg by mouth daily.     . fexofenadine (ALLEGRA) 180 MG tablet Take 180 mg by mouth at bedtime.     . fish oil-omega-3 fatty acids 1000 MG capsule Take 3,600 g by mouth daily.    . fluticasone (FLONASE) 50 MCG/ACT nasal spray Place into the nose.    . gabapentin (NEURONTIN) 100 MG capsule TAKE 2 CAPSULES (200 MG TOTAL) BY MOUTH AT BEDTIME. 180 capsule 0  . losartan-hydrochlorothiazide (HYZAAR) 50-12.5 MG tablet TAKE 1 TABLET BY MOUTH EVERY DAY 90 tablet 3  . meloxicam (MOBIC) 15 MG  tablet TAKE 1 TABLET BY MOUTH EVERY DAY 30 tablet 2  . Misc Natural Products (TART CHERRY ADVANCED PO) Take 3,600 mg by mouth daily.    . Multiple Vitamin (MULTIVITAMIN) tablet Take 1 tablet by mouth daily.    . rosuvastatin (CRESTOR) 20 MG tablet TAKE 1 TABLET BY MOUTH EVERY DAY 90 tablet 2  . triamcinolone (KENALOG) 0.1 % paste SMARTSIG:1 TO TEETH Every Night    . valACYclovir (VALTREX) 500 MG tablet Take 500 mg by mouth daily.     No current facility-administered medications for this visit.    Allergies as of 12/17/2020 - Review Complete 12/17/2020  Allergen Reaction Noted  . Beta adrenergic blockers Other (See Comments) 07/17/2020  . Lisinopril Cough 07/17/2020  . Asa [aspirin] Rash 11/16/2017  . Demerol Itching     Family History  Problem Relation Age of Onset  . Stroke Father   . Gout Father   . Depression Brother   . Dermatomyositis Daughter   . Colon cancer Neg Hx   . Rectal cancer Neg Hx     Social History   Socioeconomic History  . Marital status: Married    Spouse name: Not on file  . Number of children: 1  . Years of education: Not on file  . Highest education level: Not on file  Occupational History  . Not on file  Tobacco Use  . Smoking status: Former Smoker    Types: Cigars  . Smokeless tobacco: Never Used  . Tobacco comment: occasional cigar  Vaping Use  . Vaping Use: Never used  Substance and Sexual Activity  . Alcohol use: Yes    Alcohol/week: 3.0 standard drinks    Types: 3 Standard drinks or equivalent per week    Comment: occ 2-3 beers  . Drug use: No  . Sexual activity: Yes  Other Topics Concern  . Not on file  Social History Narrative  . Not on file   Social Determinants of Health   Financial Resource Strain: Not on file  Food Insecurity: Not on file  Transportation Needs: Not on file  Physical Activity: Not on file  Stress: Not on file  Social Connections: Not on file  Intimate Partner Violence: Not on file    Review of  Systems:    Constitutional: No weight loss, fever or chills Skin: No rash  Cardiovascular: No chest pain Respiratory: No SOB  Gastrointestinal: See HPI and otherwise negative Genitourinary: No dysuria  Neurological: No headache, dizziness or syncope Musculoskeletal: No new muscle or joint pain Hematologic: No bleeding Psychiatric: No history of depression or anxiety    Physical Exam:  Vital signs:  BP 130/74   Pulse 80   Ht 6' (1.829 m)   Wt 196 lb 12.8 oz (89.3 kg)   BMI 26.69 kg/m   Constitutional:   Pleasant Caucasian male appears to be in NAD, Well developed, Well nourished, alert and cooperative Respiratory: Respirations even and unlabored. Lungs clear to auscultation bilaterally.   No wheezes, crackles, or rhonchi.  Cardiovascular: Normal S1, S2. No MRG. Regular rate and rhythm. No peripheral edema, cyanosis or pallor.  Gastrointestinal:  Soft, nondistended, nontender. No rebound or guarding. Normal bowel sounds. No appreciable masses or hepatomegaly. Rectal:  Not performed.  Psychiatric: Oriented to person, place and time. Demonstrates good judgement and reason without abnormal affect or behaviors.  No recent labs.  Assessment: 1.  GERD: Some better with Nexium 40 mg twice daily 2.  Cough: Apparently ruled out by ENT for postnasal drip, consider reflux 3.  Esophageal pain: Consider relation to all of above; consider esophagitis versus other  Plan: 1.  Scheduled patient for an EGD with Dr. Henrene Pastor in the [facility].  Patient was provided with a detailed list of risks for the procedure and he agrees to proceed. 2.  Recommend the patient continue his Nexium 40 mg twice daily, 30-60 minutes for breakfast and dinner, prescribed #60 with 3 refills 3.  Prescribed Carafate liquid, 4 times daily 20 to 30 minutes before meals and at bedtime. 4.  Prescribed GI cocktail 5-10 mL every 4 hours as needed 5.  Patient to follow in clinic per recommendations from Dr. Henrene Pastor after time of  procedure.  Ellouise Newer, PA-C [facility] 12/17/2020, 9:58 AM

## 2020-12-20 ENCOUNTER — Other Ambulatory Visit: Payer: Self-pay | Admitting: Family Medicine

## 2020-12-21 ENCOUNTER — Ambulatory Visit: Payer: 59 | Admitting: Internal Medicine

## 2020-12-21 ENCOUNTER — Other Ambulatory Visit: Payer: Self-pay

## 2020-12-21 ENCOUNTER — Encounter: Payer: Self-pay | Admitting: Internal Medicine

## 2020-12-21 DIAGNOSIS — R040 Epistaxis: Secondary | ICD-10-CM | POA: Diagnosis not present

## 2020-12-21 NOTE — Patient Instructions (Signed)
Use saline nasal spray in each nostril a few times a day  Get a humidifier for your bedroom or house  Use vaseline in the nostril at night to keep the mucus membrane moist  If the bleeding start you can use Afrin nasal spray and apply pressure as long as it takes to stop the bleeding.    If you continue to have recurrent bleeding we can refer you to an ENT.

## 2020-12-21 NOTE — Assessment & Plan Note (Signed)
Acute Related to dryness - has had this in the past and did have it cauterized Daily bleeding, stops easily Continue saline nasal spray - can increase use Vaseline at night to nostril humidifier at night Will refer to ENT if continues to bleed

## 2020-12-21 NOTE — Progress Notes (Signed)
Subjective:    Patient ID: Matthew [name], male    DOB: 04-08-1956, 65 y.o.   MRN: [id number]  HPI The patient is here for an acute visit.   Frequent nosebleeds -    Had nosebleeds in past and has need it to be cauterized.  This winter it has been steady. It is only the left nostril.  He uses saline solution twice daily.  He has a scab.   He denies cold symptoms, sinus pain, headaches.   Medications and allergies reviewed with patient and updated if appropriate.  Patient Active Problem List   Diagnosis Date Noted  . Acquired trigger finger of right middle finger 11/12/2020  . Sinusitis, chronic 08/18/2020  . History of atrial fibrillation 05/25/2020  . SVT (supraventricular tachycardia) ([place]) 03/09/2020  . Subluxation of distal radial-ulnar joint 12/03/2019  . Golfer's elbow, right 09/25/2019  . Former smoker 03/07/2019  . Right groin pain 11/08/2018  . Chronic knee pain after total replacement of left knee joint 06/04/2018  . Subungual hematoma of foot, initial encounter 05/29/2018  . Pain and swelling of left knee 05/29/2018  . Heart beat abnormality 12/14/2017  . [name] 11/15/2017  . Primary osteoarthritis of left knee 11/06/2017  . Lipoma of torso 11/02/2017  . Ganglion 11/02/2017  . Sleep deficient 03/22/2017  . Bursitis of left foot 11/11/2016  . Subluxation of tendon of long head of biceps 10/15/2016  . Leg cramps, sleep related 10/15/2016  . Hamstring tendinitis of left thigh 07/26/2016  . Gout 07/26/2016  . Osteitis pubis ([place]) 01/27/2016  . Neck tightness 08/12/2015  . Nonallopathic lesion of cervical region 08/12/2015  . Nonallopathic lesion of thoracic region 08/12/2015  . Nonallopathic lesion-rib cage 08/12/2015  . ACE-inhibitor cough 08/12/2015  . GERD (gastroesophageal reflux disease) 08/01/2014  . Neck pain 03/03/2014  . Hypertension 02/14/2014  . Allergic rhinitis 02/14/2014  . Herpes genitalis in men 02/14/2014  . Personal history  of colonic polyps 05/06/2011  . Pes planus 04/30/2008    Current Outpatient Medications on File Prior to Visit  Medication Sig Dispense Refill  . allopurinol (ZYLOPRIM) 300 MG tablet Take 1 tablet (300 mg total) by mouth daily. 90 tablet 3  . AMBULATORY NON FORMULARY MEDICATION GI cocktail: 90 ml vicous lidocaine , 90 ml 10mg /56ml dicyclomine , 270 ml maalox 5-10 ml every 4 hours before breakfast 450 mL 0  . amoxicillin (AMOXIL) 500 MG capsule Take 2,000 mg by mouth once.     [name] aspirin EC 81 MG tablet Take 81 mg by mouth daily.    . cholecalciferol (VITAMIN D) 1000 units tablet Take 1,000 Units by mouth daily.    [name] diltiazem (CARDIZEM CD) 360 MG 24 hr capsule Take 1 capsule (360 mg total) by mouth daily. 90 capsule 3  . esomeprazole (NEXIUM) 40 MG capsule Take 1 capsule (40 mg total) by mouth 2 (two) times daily before a meal. Take one tablet 30-60 minutes before breakfast 60 capsule 3  . Ferrous Sulfate (IRON) 325 (65 FE) MG TABS Take 325 mg by mouth daily.     . fexofenadine (ALLEGRA) 180 MG tablet Take 180 mg by mouth at bedtime.     . fish oil-omega-3 fatty acids 1000 MG capsule Take 3,600 g by mouth daily.    . fluticasone (FLONASE) 50 MCG/ACT nasal spray Place into the nose.    . gabapentin (NEURONTIN) 100 MG capsule TAKE 2 CAPSULES (200 MG TOTAL) BY MOUTH AT BEDTIME. 180 capsule 0  .  losartan-hydrochlorothiazide (HYZAAR) 50-12.5 MG tablet TAKE 1 TABLET BY MOUTH EVERY DAY 90 tablet 3  . meloxicam (MOBIC) 15 MG tablet TAKE 1 TABLET BY MOUTH EVERY DAY 30 tablet 2  . Misc Natural Products (TART CHERRY ADVANCED PO) Take 3,600 mg by mouth daily.    . Multiple Vitamin (MULTIVITAMIN) tablet Take 1 tablet by mouth daily.    . rosuvastatin (CRESTOR) 20 MG tablet TAKE 1 TABLET BY MOUTH EVERY DAY 90 tablet 2  . sucralfate (CARAFATE) 1 g tablet Take 1 tablet (1 g total) by mouth 4 (four) times daily. Take one tablet 30-60 minutes before breakfast. 120 tablet 3  . triamcinolone (KENALOG) 0.1 % paste  SMARTSIG:1 TO TEETH Every Night    . valACYclovir (VALTREX) 500 MG tablet Take 500 mg by mouth daily.    [name] dicyclomine (BENTYL) 10 MG/5ML solution      No current facility-administered medications on file prior to visit.    Past Medical History:  Diagnosis Date  . Allergy    RHINITIS  . Anemia   . Arthritis   . Bursitis of left foot 11/11/2016  . Colonic polyp 09/2006   colonoscopy  . Ganglion 11/02/2017  . GERD (gastroesophageal reflux disease)   . Golfer's elbow, right 09/25/2019  . Gout   . Hamstring tendinitis of left thigh 07/26/2016  . Heart beat abnormality 12/14/2017  . Herpes genitalis in men 02/14/2014  . Hypertension   . Osteitis pubis ([place]) 01/27/2016  . Primary osteoarthritis of left knee 11/06/2017  . Right groin pain 11/08/2018  . SOB (shortness of breath)    per pt, may be coming from new medication  . Subluxation of distal radial-ulnar joint 12/03/2019  . Subluxation of tendon of long head of biceps 10/15/2016   Injected shoulder 12/19/2016  . Subungual hematoma of foot, initial encounter 05/29/2018  . SVT (supraventricular tachycardia) ([place]) 03/09/2020    Past Surgical History:  Procedure Laterality Date  . COLONOSCOPY  09/2006  . KNEE ARTHROSCOPY Left 2011  . KNEE SURGERY Left 08/2018   scar tissue surgery  . [place] SURGERY  1990  . SVT ABLATION N/A 01/09/2020   Procedure: SVT ABLATION;  Surgeon: Constance Haw, MD;  Location: [facility];  Service: Cardiovascular;  Laterality: N/A;  . TOTAL KNEE ARTHROPLASTY Left 11/06/2017   Procedure: TOTAL KNEE ARTHROPLASTY;  Surgeon: Dorna Leitz, MD;  Location: [facility];  Service: Orthopedics;  Laterality: Left;    Social History   Socioeconomic History  . Marital status: Married    Spouse name: Not on file  . Number of children: 1  . Years of education: Not on file  . Highest education level: Not on file  Occupational History  . Not on file  Tobacco Use  . Smoking status: Former Smoker    Types:  Cigars  . Smokeless tobacco: Never Used  . Tobacco comment: occasional cigar  Vaping Use  . Vaping Use: Never used  Substance and Sexual Activity  . Alcohol use: Yes    Alcohol/week: 3.0 standard drinks    Types: 3 Standard drinks or equivalent per week    Comment: occ 2-3 beers  . Drug use: No  . Sexual activity: Yes  Other Topics Concern  . Not on file  Social History Narrative  . Not on file   Social Determinants of Health   Financial Resource Strain: Not on file  Food Insecurity: Not on file  Transportation Needs: Not on file  Physical Activity: Not on  file  Stress: Not on file  Social Connections: Not on file    Family History  Problem Relation Age of Onset  . Stroke Father   . Gout Father   . Depression Brother   . Dermatomyositis Daughter   . Colon cancer Neg Hx   . Rectal cancer Neg Hx   . Esophageal cancer Neg Hx   . Pancreatic cancer Neg Hx   . Stomach cancer Neg Hx   . Liver disease Neg Hx     Review of Systems Per HPI     Objective:   Vitals:   12/21/20 0746  BP: 120/82  Pulse: 71  Temp: 98.2 F (36.8 C)  SpO2: 97%   BP Readings from Last 3 Encounters:  12/21/20 120/82  12/17/20 130/74  12/16/20 120/82   Wt Readings from Last 3 Encounters:  12/21/20 198 lb (89.8 kg)  12/17/20 196 lb 12.8 oz (89.3 kg)  12/16/20 197 lb (89.4 kg)   Body mass index is 26.85 kg/m.   Physical Exam Constitutional:      General: He is not in acute distress.    Appearance: Normal appearance. He is not ill-appearing.  HENT:     Head: Normocephalic and atraumatic.     Nose:     Comments: Left nostril with scab lateral aspect. No active bleeding.  Skin:    General: Skin is warm and dry.  Neurological:     Mental Status: He is alert.            Assessment & Plan:    See Problem List for Assessment and Plan of chronic medical problems.    This visit occurred during the SARS-CoV-2 public health emergency.  Safety protocols were in place,  including screening questions prior to the visit, additional usage of staff PPE, and extensive cleaning of exam room while observing appropriate contact time as indicated for disinfecting solutions.

## 2020-12-22 ENCOUNTER — Ambulatory Visit (AMBULATORY_SURGERY_CENTER): Payer: 59 | Admitting: Internal Medicine

## 2020-12-22 ENCOUNTER — Encounter: Payer: Self-pay | Admitting: Internal Medicine

## 2020-12-22 VITALS — BP 132/85 | HR 55 | Temp 97.7°F | Resp 11 | Ht 72.0 in | Wt 196.0 lb

## 2020-12-22 DIAGNOSIS — K219 Gastro-esophageal reflux disease without esophagitis: Secondary | ICD-10-CM

## 2020-12-22 DIAGNOSIS — R059 Cough, unspecified: Secondary | ICD-10-CM

## 2020-12-22 DIAGNOSIS — J029 Acute pharyngitis, unspecified: Secondary | ICD-10-CM

## 2020-12-22 MED ORDER — SODIUM CHLORIDE 0.9 % IV SOLN: 500.0000 mL | Freq: Once | INTRAVENOUS | Status: DC

## 2020-12-22 NOTE — Op Note (Signed)
[facility] Patient Name: Matthew [name] Procedure Date: 12/22/2020 11:06 AM MRN: [id number] Endoscopist: [name] , MD Age: 65 Referring MD:  Date of Birth: 24-Oct-1956 Gender: Male Account #: [id number] Procedure:                Upper GI endoscopy Indications:              Esophageal reflux, Chronic cough, Sore throat Medicines:                Monitored Anesthesia Care Procedure:                Pre-Anesthesia Assessment:                           - Prior to the procedure, a History and Physical                            was performed, and patient medications and                            allergies were reviewed. The patient's tolerance of                            previous anesthesia was also reviewed. The risks                            and benefits of the procedure and the sedation                            options and risks were discussed with the patient.                            All questions were answered, and informed consent                            was obtained. Prior Anticoagulants: The patient has                            taken no previous anticoagulant or antiplatelet                            agents. After reviewing the risks and benefits, the                            patient was deemed in satisfactory condition to                            undergo the procedure.                           After obtaining informed consent, the endoscope was                            passed under direct vision. Throughout the  procedure, the patient's blood pressure, pulse, and                            oxygen saturations were monitored continuously. The                            Endoscope was introduced through the mouth, and                            advanced to the second part of duodenum. The upper                            GI endoscopy was accomplished without difficulty.                            The patient tolerated the  procedure well. Scope In: Scope Out: Findings:                 The esophagus was normal.                           The stomach was normal, save small hiatal hernia.                           The examined duodenum was normal.                           The cardia and gastric fundus were normal on                            retroflexion. Complications:            No immediate complications. Estimated Blood Loss:     Estimated blood loss: none. Impression:               1. Essentially normal EGD                           2. GERD                           3. Pharyngeal symptoms of uncertain etiology.                            Possibly GERD. Recommendation:           1. Patient has a contact number available for                            emergencies. The signs and symptoms of potential                            delayed complications were discussed with the                            patient. Return to normal activities tomorrow.  Written discharge instructions were provided to the                            patient.                           2. Resume previous diet.                           3. Continue Nexium twice daily for an additional 6                            to 8 weeks to see if this helps symptoms                           4. If pharyngeal symptoms persist despite an                            extended course of therapy for reflux, consider                            other causes.                           5. Return to the care of your primary provider [name], MD 12/22/2020 11:29:31 AM This report has been signed electronically.

## 2020-12-22 NOTE — Patient Instructions (Signed)
Please read handouts provided. Continue present medications. Continue Nexium twice daily for an additional 6 to 8 weeks. Return to care of your primary provider.     YOU HAD AN ENDOSCOPIC PROCEDURE TODAY AT [facility]:   Refer to the procedure report that was given to you for any specific questions about what was found during the examination.  If the procedure report does not answer your questions, please call your gastroenterologist to clarify.  If you requested that your care partner not be given the details of your procedure findings, then the procedure report has been included in a sealed envelope for you to review at your convenience later.  YOU SHOULD EXPECT: Some feelings of bloating in the abdomen. Passage of more gas than usual.  Walking can help get rid of the air that was put into your GI tract during the procedure and reduce the bloating. If you had a lower endoscopy (such as a colonoscopy or flexible sigmoidoscopy) you may notice spotting of blood in your stool or on the toilet paper. If you underwent a bowel prep for your procedure, you may not have a normal bowel movement for a few days.  Please Note:  You might notice some irritation and congestion in your nose or some drainage.  This is from the oxygen used during your procedure.  There is no need for concern and it should clear up in a day or so.  SYMPTOMS TO REPORT IMMEDIATELY:    Following upper endoscopy (EGD)  Vomiting of blood or coffee ground material  New chest pain or pain under the shoulder blades  Painful or persistently difficult swallowing  New shortness of breath  Fever of 100F or higher  Black, tarry-looking stools  For urgent or emergent issues, a gastroenterologist can be reached at any hour by calling [phone]. Do not use MyChart messaging for urgent concerns.    DIET:  We do recommend a small meal at first, but then you may proceed to your regular diet.  Drink plenty of fluids  but you should avoid alcoholic beverages for 24 hours.  ACTIVITY:  You should plan to take it easy for the rest of today and you should NOT DRIVE or use heavy machinery until tomorrow (because of the sedation medicines used during the test).    FOLLOW UP: Our staff will call the number listed on your records 48-72 hours following your procedure to check on you and address any questions or concerns that you may have regarding the information given to you following your procedure. If we do not reach you, we will leave a message.  We will attempt to reach you two times.  During this call, we will ask if you have developed any symptoms of COVID 19. If you develop any symptoms (ie: fever, flu-like symptoms, shortness of breath, cough etc.) before then, please call [phone].  If you test positive for Covid 19 in the 2 weeks post procedure, please call and report this information to [place].    If any biopsies were taken you will be contacted by phone or by letter within the next 1-3 weeks.  Please call us at [phone] if you have not heard about the biopsies in 3 weeks.    SIGNATURES/CONFIDENTIALITY: You and/or your care partner have signed paperwork which will be entered into your electronic medical record.  These signatures attest to the fact that that the information above on your After Visit Summary has been reviewed and is understood.  Full responsibility of the confidentiality of this discharge information lies with you and/or your care-partner.

## 2020-12-22 NOTE — Progress Notes (Signed)
VS by CW. ?

## 2020-12-22 NOTE — Progress Notes (Signed)
PT taken to PACU. Monitors in place. VSS. Report given to RN. 

## 2020-12-24 ENCOUNTER — Telehealth: Payer: Self-pay | Admitting: *Deleted

## 2020-12-24 NOTE — Telephone Encounter (Signed)
  Follow up Call-  Call back number 12/22/2020  Post procedure Call Back phone  # [phone]  Permission to leave phone message Yes  Some recent data might be hidden     Patient questions:  Do you have a fever, pain , or abdominal swelling? No. Pain Score  0 *  Have you tolerated food without any problems? Yes.    Have you been able to return to your normal activities? Yes.    Do you have any questions about your discharge instructions: Diet   No. Medications  No. Follow up visit  No.  Do you have questions or concerns about your Care? No.  Actions: * If pain score is 4 or above: No action needed, pain <4.   1. Have you developed a fever since your procedure? no  2.   Have you had an respiratory symptoms (SOB or cough) since your procedure? no  3.   Have you tested positive for COVID 19 since your procedure no  4.   Have you had any family members/close contacts diagnosed with the COVID 19 since your procedure?  no   If yes to any of these questions please route to Joylene John, RN and Joella Prince, RN

## 2020-12-31 ENCOUNTER — Other Ambulatory Visit: Payer: Self-pay

## 2020-12-31 DIAGNOSIS — R059 Cough, unspecified: Secondary | ICD-10-CM

## 2021-01-06 ENCOUNTER — Encounter: Payer: Self-pay | Admitting: Internal Medicine

## 2021-01-06 DIAGNOSIS — R059 Cough, unspecified: Secondary | ICD-10-CM

## 2021-01-08 ENCOUNTER — Encounter: Payer: Self-pay | Admitting: Internal Medicine

## 2021-01-08 ENCOUNTER — Other Ambulatory Visit: Payer: Self-pay

## 2021-01-08 MED ORDER — AMBULATORY NON FORMULARY MEDICATION: 0 refills | Status: DC

## 2021-01-11 ENCOUNTER — Other Ambulatory Visit (HOSPITAL_COMMUNITY): Payer: Self-pay

## 2021-01-11 DIAGNOSIS — R1310 Dysphagia, unspecified: Secondary | ICD-10-CM

## 2021-01-12 ENCOUNTER — Ambulatory Visit: Payer: 59 | Admitting: Family Medicine

## 2021-01-14 ENCOUNTER — Ambulatory Visit (HOSPITAL_COMMUNITY)
Admission: RE | Admit: 2021-01-14 | Discharge: 2021-01-14 | Disposition: A | Discharge status: Home / Self Care | Payer: 59 | Source: Ambulatory Visit | Attending: Internal Medicine | Admitting: Internal Medicine

## 2021-01-14 ENCOUNTER — Encounter (HOSPITAL_COMMUNITY): Payer: Self-pay

## 2021-01-14 ENCOUNTER — Other Ambulatory Visit: Payer: Self-pay

## 2021-01-14 DIAGNOSIS — R059 Cough, unspecified: Secondary | ICD-10-CM | POA: Insufficient documentation

## 2021-01-14 DIAGNOSIS — R1310 Dysphagia, unspecified: Secondary | ICD-10-CM | POA: Diagnosis not present

## 2021-01-14 DIAGNOSIS — R1313 Dysphagia, pharyngeal phase: Secondary | ICD-10-CM | POA: Insufficient documentation

## 2021-01-17 ENCOUNTER — Encounter: Payer: Self-pay | Admitting: Internal Medicine

## 2021-01-17 DIAGNOSIS — R1313 Dysphagia, pharyngeal phase: Secondary | ICD-10-CM | POA: Insufficient documentation

## 2021-01-22 ENCOUNTER — Other Ambulatory Visit: Payer: Self-pay | Admitting: Family Medicine

## 2021-01-26 NOTE — Progress Notes (Signed)
[facility] [place] Phone: [phone] Subjective:   [name], am serving as a scribe for Dr. Hulan Saas. This visit occurred during the SARS-CoV-2 public health emergency.  Safety protocols were in place, including screening questions prior to the visit, additional usage of staff PPE, and extensive cleaning of exam room while observing appropriate contact time as indicated for disinfecting solutions.   I'm seeing this patient by the request  of:  [name], MD  CC: Hand pain  [id number]   12/06/2020 Stable at the moment.  No significant change in management.  Did not need to do any significant osteopathic manipulation today.  Patient has had a trigger finger previously.  Still declined injection.  Patient has been noncompliant with the bracing.  Discussed heat and massage and bracing at night.  Worsening pain will consider injection and follow-up in 6 weeks  Update 01/27/2021 Matthew [name] is a 65 y.o. male coming in with complaint of trigger finger and neck pain. Patient states that his trigger finger is getting worse on right hand, middle finger.  Patient states that it is starting to be more annoying.  But denies significant amount of pain.     Past Medical History:  Diagnosis Date  . Allergy    RHINITIS  . Anemia   . Arthritis   . Bursitis of left foot 11/11/2016  . Colonic polyp 09/2006   colonoscopy  . Ganglion 11/02/2017  . GERD (gastroesophageal reflux disease)   . Golfer's elbow, right 09/25/2019  . Gout   . Heart beat abnormality 12/14/2017  . Heart murmur   . Herpes genitalis in men 02/14/2014  . Hypertension   . Osteitis pubis ([place]) 01/27/2016  . Primary osteoarthritis of left knee 11/06/2017  . Right groin pain 11/08/2018  . SOB (shortness of breath)    per pt, may be coming from new medication  . Subluxation of distal radial-ulnar joint 12/03/2019  . Subluxation of tendon of long head of  biceps 10/15/2016   Injected shoulder 12/19/2016  . Subungual hematoma of foot, initial encounter 05/29/2018  . SVT (supraventricular tachycardia) ([place]) 03/09/2020   Past Surgical History:  Procedure Laterality Date  . COLONOSCOPY  09/2006  . KNEE ARTHROSCOPY Left 2011  . KNEE SURGERY Left 08/2018   scar tissue surgery  . [place] SURGERY  1990  . SVT ABLATION N/A 01/09/2020   Procedure: SVT ABLATION;  Surgeon: Constance Haw, MD;  Location: [facility];  Service: Cardiovascular;  Laterality: N/A;  . TOTAL KNEE ARTHROPLASTY Left 11/06/2017   Procedure: TOTAL KNEE ARTHROPLASTY;  Surgeon: Dorna Leitz, MD;  Location: [facility];  Service: Orthopedics;  Laterality: Left;   Social History   Socioeconomic History  . Marital status: Married    Spouse name: Not on file  . Number of children: 1  . Years of education: Not on file  . Highest education level: Not on file  Occupational History  . Not on file  Tobacco Use  . Smoking status: Current Some Day Smoker    Types: Cigars  . Smokeless tobacco: Never Used  . Tobacco comment: occasional cigar  Vaping Use  . Vaping Use: Never used  Substance and Sexual Activity  . Alcohol use: Yes    Alcohol/week: 3.0 standard drinks    Types: 3 Standard drinks or equivalent per week    Comment: occ 2-3 beers  . Drug use: No  . Sexual activity:  Yes  Other Topics Concern  . Not on file  Social History Narrative  . Not on file   Social Determinants of Health   Financial Resource Strain: Not on file  Food Insecurity: Not on file  Transportation Needs: Not on file  Physical Activity: Not on file  Stress: Not on file  Social Connections: Not on file   Allergies  Allergen Reactions  . Beta Adrenergic Blockers Other (See Comments)    Fatigue  . Lisinopril Cough  . Asa [Aspirin] Rash    High dosage ASA causes break out  . Demerol Itching   Family History  Problem Relation Age of Onset  . Stroke Father   . Gout Father   .  Depression Brother   . Dermatomyositis Daughter   . Colon cancer Neg Hx   . Rectal cancer Neg Hx   . Esophageal cancer Neg Hx   . Pancreatic cancer Neg Hx   . Stomach cancer Neg Hx   . Liver disease Neg Hx      Current Outpatient Medications (Cardiovascular):  .  diltiazem (CARDIZEM CD) 360 MG 24 hr capsule, Take 1 capsule (360 mg total) by mouth daily. [name]  losartan-hydrochlorothiazide (HYZAAR) 50-12.5 MG tablet, TAKE 1 TABLET BY MOUTH EVERY DAY .  rosuvastatin (CRESTOR) 20 MG tablet, TAKE 1 TABLET BY MOUTH EVERY DAY  Current Outpatient Medications (Respiratory):  .  fexofenadine (ALLEGRA) 180 MG tablet, Take 180 mg by mouth at bedtime.  .  fluticasone (FLONASE) 50 MCG/ACT nasal spray, Place into the nose.  Current Outpatient Medications (Analgesics):  .  allopurinol (ZYLOPRIM) 300 MG tablet, Take 1 tablet (300 mg total) by mouth daily. [name]  aspirin EC 81 MG tablet, Take 81 mg by mouth daily. .  meloxicam (MOBIC) 15 MG tablet, TAKE 1 TABLET BY MOUTH EVERY DAY  Current Outpatient Medications (Hematological):  [name]  Ferrous Sulfate (IRON) 325 (65 FE) MG TABS, Take 325 mg by mouth daily.   Current Outpatient Medications (Other):  [name]  AMBULATORY NON FORMULARY MEDICATION, GI cocktail: 90 ml vicous lidocaine , 90 ml 10mg /39ml dicyclomine , 270 ml maalox 5-10 ml every 4 hours before breakfast .  amoxicillin (AMOXIL) 500 MG capsule, Take 2,000 mg by mouth once. Prior to dental work .  cholecalciferol (VITAMIN D) 1000 units tablet, Take 1,000 Units by mouth daily. [name]  dicyclomine (BENTYL) 10 MG/5ML solution,  .  esomeprazole (NEXIUM) 40 MG capsule, Take 1 capsule (40 mg total) by mouth 2 (two) times daily before a meal. Take one tablet 30-60 minutes before breakfast .  fish oil-omega-3 fatty acids 1000 MG capsule, Take 3,600 g by mouth daily. [name]  gabapentin (NEURONTIN) 100 MG capsule, TAKE 2 CAPSULES (200 MG TOTAL) BY MOUTH AT BEDTIME. [name]  Misc Natural Products (TART CHERRY ADVANCED PO), Take 3,600  mg by mouth daily. .  Multiple Vitamin (MULTIVITAMIN) tablet, Take 1 tablet by mouth daily. .  sucralfate (CARAFATE) 1 g tablet, Take 1 tablet (1 g total) by mouth 4 (four) times daily. Take one tablet 30-60 minutes before breakfast. .  triamcinolone (KENALOG) 0.1 % paste, SMARTSIG:1 TO TEETH Every Night .  valACYclovir (VALTREX) 500 MG tablet, Take 500 mg by mouth daily.   Reviewed prior external information including notes and imaging from  primary care provider As well as notes that were available from care everywhere and other healthcare systems.  Past medical history, social, surgical and family history all reviewed in electronic medical record.  No pertanent information unless stated regarding  to the chief complaint.   Review of Systems:  No headache, visual changes, nausea, vomiting, diarrhea, constipation, dizziness, abdominal pain, skin rash, fevers, chills, night sweats, weight loss, swollen lymph nodes, body aches, joint swelling, chest pain, shortness of breath, mood changes. POSITIVE muscle aches  Objective  Blood pressure 124/80, pulse 74, height 6' (1.829 m), weight 197 lb (89.4 kg), SpO2 98 %.   General: No apparent distress alert and oriented x3 mood and affect normal, dressed appropriately.  HEENT: Pupils equal, extraocular movements intact  Respiratory: Patient's speak in full sentences and does not appear short of breath  Cardiovascular: No lower extremity edema, non tender, no erythema  Gait normal with good balance and coordination.  MSK:   Right hand exam shows the patient does have a trigger nodule noted at the A2 pulley.  Triggering noted.  It does not get stuck and patient can do it with active extension of the finger.  Neurovascularly intact distally.   Procedure: Real-time Ultrasound Guided Injection of right third finger flexor tendon sheath Device: GE Logiq Q7 Ultrasound guided injection is preferred based studies that show increased duration, increased  effect, greater accuracy, decreased procedural pain, increased response rate, and decreased cost with ultrasound guided versus blind injection.  Verbal informed consent obtained.  Time-out conducted.  Noted no overlying erythema, induration, or other signs of local infection.  Skin prepped in a sterile fashion.  Local anesthesia: Topical Ethyl chloride.  With sterile technique and under real time ultrasound guidance: With a 25-gauge half inch needle injected with 0.25 cc of 0.5% Marcaine and 0.25 cc of Kenalog 40 mg/mL into the tendon sheath Completed without difficulty  Pain immediately improved suggesting accurate placement of the medication.  Triggering was still minorly noted. Advised to call if fevers/chills, erythema, induration, drainage, or persistent bleeding.  Impression: Technically successful ultrasound guided injection.   Impression and Recommendations:     The above documentation has been reviewed and is accurate and complete Lyndal Pulley, DO

## 2021-01-27 ENCOUNTER — Ambulatory Visit (INDEPENDENT_AMBULATORY_CARE_PROVIDER_SITE_OTHER): Payer: 59 | Admitting: Family Medicine

## 2021-01-27 ENCOUNTER — Encounter: Payer: Self-pay | Admitting: Family Medicine

## 2021-01-27 ENCOUNTER — Other Ambulatory Visit: Payer: Self-pay

## 2021-01-27 ENCOUNTER — Ambulatory Visit: Payer: Self-pay

## 2021-01-27 VITALS — BP 124/80 | HR 74 | Ht 72.0 in | Wt 197.0 lb

## 2021-01-27 DIAGNOSIS — M79641 Pain in right hand: Secondary | ICD-10-CM | POA: Diagnosis not present

## 2021-01-27 DIAGNOSIS — M65331 Trigger finger, right middle finger: Secondary | ICD-10-CM

## 2021-01-27 NOTE — Assessment & Plan Note (Signed)
Patient given injection today and tolerated the procedure well.  Discussed with patient about icing regimen, home exercises.  We discussed wearing the brace at night.  Did have some mild difficulty getting the medication in so if patient continues to have difficulty at follow-up I think a repeat injection would be possible.  Did discuss with patient that if any redness, discoloration to call immediately.  Follow-up with me again 6 weeks

## 2021-02-02 ENCOUNTER — Other Ambulatory Visit: Payer: Self-pay | Admitting: Physician Assistant

## 2021-02-02 ENCOUNTER — Encounter: Payer: Self-pay | Admitting: Internal Medicine

## 2021-02-05 ENCOUNTER — Other Ambulatory Visit: Payer: Self-pay | Admitting: Physician Assistant

## 2021-02-14 ENCOUNTER — Other Ambulatory Visit: Payer: Self-pay | Admitting: Family Medicine

## 2021-02-16 ENCOUNTER — Other Ambulatory Visit: Payer: Self-pay | Admitting: Physician Assistant

## 2021-02-24 ENCOUNTER — Encounter: Payer: Self-pay | Admitting: Family Medicine

## 2021-02-24 ENCOUNTER — Ambulatory Visit: Payer: Self-pay

## 2021-02-24 ENCOUNTER — Ambulatory Visit: Payer: 59 | Admitting: Family Medicine

## 2021-02-24 ENCOUNTER — Other Ambulatory Visit: Payer: Self-pay

## 2021-02-24 VITALS — BP 140/80 | HR 63 | Ht 72.0 in | Wt 197.0 lb

## 2021-02-24 DIAGNOSIS — M25512 Pain in left shoulder: Secondary | ICD-10-CM | POA: Insufficient documentation

## 2021-02-24 DIAGNOSIS — G8929 Other chronic pain: Secondary | ICD-10-CM | POA: Diagnosis not present

## 2021-02-24 NOTE — Progress Notes (Signed)
[facility] [address] [place] Phone: [phone] Subjective:   I Matthew [name] am serving as a Education administrator for Dr. Hulan Saas.  This visit occurred during the SARS-CoV-2 public health emergency.  Safety protocols were in place, including screening questions prior to the visit, additional usage of staff PPE, and extensive cleaning of exam room while observing appropriate contact time as indicated for disinfecting solutions.   I'm seeing this patient by the request  of:  [name], MD  CC: Left shoulder pain  [id number]   01/27/2021 Patient given injection today and tolerated the procedure well.  Discussed with patient about icing regimen, home exercises.  We discussed wearing the brace at night.  Did have some mild difficulty getting the medication in so if patient continues to have difficulty at follow-up I think a repeat injection would be possible.  Did discuss with patient that if any redness, discoloration to call immediately.  Follow-up with me again 6 weeks   Matthew [name] is a 65 y.o. male coming in with complaint of left shoulder pain. States he felt pain during golf yesterday. Ice and Advil. Pain with ROM.  Patient states that it is very tender.  Onset- yesterday  Location - joint pain  Aggravating factors- golf     Past Medical History:  Diagnosis Date  . Allergy    RHINITIS  . Anemia   . Arthritis   . Bursitis of left foot 11/11/2016  . Colonic polyp 09/2006   colonoscopy  . Ganglion 11/02/2017  . GERD (gastroesophageal reflux disease)   . Golfer's elbow, right 09/25/2019  . Gout   . Heart beat abnormality 12/14/2017  . Heart murmur   . Herpes genitalis in men 02/14/2014  . Hypertension   . Osteitis pubis ([place]) 01/27/2016  . Primary osteoarthritis of left knee 11/06/2017  . Right groin pain 11/08/2018  . SOB (shortness of breath)    per pt, may be coming from new medication  . Subluxation of distal  radial-ulnar joint 12/03/2019  . Subluxation of tendon of long head of biceps 10/15/2016   Injected shoulder 12/19/2016  . Subungual hematoma of foot, initial encounter 05/29/2018  . SVT (supraventricular tachycardia) ([place]) 03/09/2020   Past Surgical History:  Procedure Laterality Date  . COLONOSCOPY  09/2006  . KNEE ARTHROSCOPY Left 2011  . KNEE SURGERY Left 08/2018   scar tissue surgery  . [place] SURGERY  1990  . SVT ABLATION N/A 01/09/2020   Procedure: SVT ABLATION;  Surgeon: Constance Haw, MD;  Location: [facility];  Service: Cardiovascular;  Laterality: N/A;  . TOTAL KNEE ARTHROPLASTY Left 11/06/2017   Procedure: TOTAL KNEE ARTHROPLASTY;  Surgeon: Dorna Leitz, MD;  Location: [facility];  Service: Orthopedics;  Laterality: Left;   Social History   Socioeconomic History  . Marital status: Married    Spouse name: Not on file  . Number of children: 1  . Years of education: Not on file  . Highest education level: Not on file  Occupational History  . Not on file  Tobacco Use  . Smoking status: Current Some Day Smoker    Types: Cigars  . Smokeless tobacco: Never Used  . Tobacco comment: occasional cigar  Vaping Use  . Vaping Use: Never used  Substance and Sexual Activity  . Alcohol use: Yes    Alcohol/week: 3.0 standard drinks    Types: 3 Standard drinks or equivalent per week    Comment:  occ 2-3 beers  . Drug use: No  . Sexual activity: Yes  Other Topics Concern  . Not on file  Social History Narrative  . Not on file   Social Determinants of Health   Financial Resource Strain: Not on file  Food Insecurity: Not on file  Transportation Needs: Not on file  Physical Activity: Not on file  Stress: Not on file  Social Connections: Not on file   Allergies  Allergen Reactions  . Beta Adrenergic Blockers Other (See Comments)    Fatigue  . Lisinopril Cough  . Asa [Aspirin] Rash    High dosage ASA causes break out  . Demerol Itching   Family History   Problem Relation Age of Onset  . Stroke Father   . Gout Father   . Depression Brother   . Dermatomyositis Daughter   . Colon cancer Neg Hx   . Rectal cancer Neg Hx   . Esophageal cancer Neg Hx   . Pancreatic cancer Neg Hx   . Stomach cancer Neg Hx   . Liver disease Neg Hx      Current Outpatient Medications (Cardiovascular):  .  diltiazem (CARDIZEM CD) 360 MG 24 hr capsule, Take 1 capsule (360 mg total) by mouth daily. [name]  losartan-hydrochlorothiazide (HYZAAR) 50-12.5 MG tablet, TAKE 1 TABLET BY MOUTH EVERY DAY .  rosuvastatin (CRESTOR) 20 MG tablet, TAKE 1 TABLET BY MOUTH EVERY DAY  Current Outpatient Medications (Respiratory):  .  fexofenadine (ALLEGRA) 180 MG tablet, Take 180 mg by mouth at bedtime.  .  fluticasone (FLONASE) 50 MCG/ACT nasal spray, Place into the nose.  Current Outpatient Medications (Analgesics):  .  allopurinol (ZYLOPRIM) 300 MG tablet, Take 1 tablet (300 mg total) by mouth daily. [name]  aspirin EC 81 MG tablet, Take 81 mg by mouth daily. .  meloxicam (MOBIC) 15 MG tablet, TAKE 1 TABLET BY MOUTH EVERY DAY  Current Outpatient Medications (Hematological):  [name]  Ferrous Sulfate (IRON) 325 (65 FE) MG TABS, Take 325 mg by mouth daily.   Current Outpatient Medications (Other):  [name]  AMBULATORY NON FORMULARY MEDICATION, GI cocktail: 90 ml vicous lidocaine , 90 ml 10mg /39ml dicyclomine , 270 ml maalox 5-10 ml every 4 hours before breakfast .  amoxicillin (AMOXIL) 500 MG capsule, Take 2,000 mg by mouth once. Prior to dental work .  cholecalciferol (VITAMIN D) 1000 units tablet, Take 1,000 Units by mouth daily. [name]  dicyclomine (BENTYL) 10 MG/5ML solution,  .  esomeprazole (NEXIUM) 40 MG capsule, TAKE 1 CAPSULE (40 MG TOTAL) BY MOUTH 2 (TWO) TIMES DAILY BEFORE A MEAL. TAKE ONE TABLET 30-60 MINUTES BEFORE BREAKFAST .  fish oil-omega-3 fatty acids 1000 MG capsule, Take 3,600 g by mouth daily. [name]  gabapentin (NEURONTIN) 100 MG capsule, TAKE 2 CAPSULES (200 MG TOTAL) BY MOUTH AT  BEDTIME. [name]  Misc Natural Products (TART CHERRY ADVANCED PO), Take 3,600 mg by mouth daily. .  Multiple Vitamin (MULTIVITAMIN) tablet, Take 1 tablet by mouth daily. .  sucralfate (CARAFATE) 1 g tablet, Take 1 tablet (1 g total) by mouth 4 (four) times daily. Take one tablet 30-60 minutes before breakfast. .  triamcinolone (KENALOG) 0.1 % paste, SMARTSIG:1 TO TEETH Every Night .  valACYclovir (VALTREX) 500 MG tablet, Take 500 mg by mouth daily.   Reviewed prior external information including notes and imaging from  primary care provider As well as notes that were available from care everywhere and other healthcare systems.  Past medical history, social, surgical and family history all  reviewed in electronic medical record.  No pertanent information unless stated regarding to the chief complaint.   Review of Systems:  No headache, visual changes, nausea, vomiting, diarrhea, constipation, dizziness, abdominal pain, skin rash, fevers, chills, night sweats, weight loss, swollen lymph nodes, body aches, joint swelling, chest pain, shortness of breath, mood changes. POSITIVE muscle aches  Objective  Blood pressure 140/80, pulse 63, height 6' (1.829 m), weight 197 lb (89.4 kg), SpO2 98 %.   General: No apparent distress alert and oriented x3 mood and affect normal, dressed appropriately.  HEENT: Pupils equal, extraocular movements intact  Respiratory: Patient's speak in full sentences and does not appear short of breath  Cardiovascular: No lower extremity edema, non tender, no erythema  Gait normal with good balance and coordination.  MSK: Left shoulder exam shows the patient has very mild positive impingement.  Tenderness with empty can but no significant weakness.  Good range of motion no noted otherwise.  5 out of 5 strength of the upper extremity.  Limited musculoskeletal ultrasound was performed and interpreted by .me  Limited ultrasound shows the patient does have hypoechoic changes in the  bicep tendon.  Seems to be acute swelling noted.  Patient anterior labrum does have a abnormality noted.  Patient subscapularis is fairly unremarkable.  Supraspinatus though does have some chronic tearing noted but no significant retraction.  No acute changes noted.  Moderate acromioclavicular narrowing noted.  Patient does have slight signs and symptoms consistent with either uric acid or CPPD.  Glenohumeral joint does have a trace effusion noted with also abnormality noted of the posterior labrum. Impression: Acute hypoechoic changes of the bicep tendon with abnormalities noted of the anterior and posterior labrum and chronic changes of the supraspinatus  Procedure: Real-time Ultrasound Guided Injection of left glenohumeral joint Device: GE Logiq E  Ultrasound guided injection is preferred based studies that show increased duration, increased effect, greater accuracy, decreased procedural pain, increased response rate with ultrasound guided versus blind injection.  Verbal informed consent obtained.  Time-out conducted.  Noted no overlying erythema, induration, or other signs of local infection.  Skin prepped in a sterile fashion.  Local anesthesia: Topical Ethyl chloride.  With sterile technique and under real time ultrasound guidance:  Joint visualized.  21g 2 inch needle inserted posterior approach. Pictures taken for needle placement. Patient did have injection of 2 cc of 0.5% Marcaine, and 1cc of Kenalog 40 mg/dL. Completed without difficulty  Pain immediately resolved suggesting accurate placement of the medication.  Advised to call if fevers/chills, erythema, induration, drainage, or persistent bleeding.  Impression: Technically successful ultrasound guided injection.    Impression and Recommendations:     The above documentation has been reviewed and is accurate and complete Lyndal Pulley, DO

## 2021-02-24 NOTE — Assessment & Plan Note (Signed)
Patient on ultrasound today did have some mild chronic changes of the supraspinatus but no true acute tear noted.  Patient did have hypoechoic changes of the bicep tendon noted.  Patient given exercises.  Patient did have some swelling of the glenohumeral joint injection given today.  Patient could have a potential labral pathology and does have acromioclavicular arthritis we will need to monitor as well.  Patient will do the home exercises, icing regimen, short course of anti-inflammatories, follow-up with me again as scheduled for his regular manipulation and we will further evaluate at that time

## 2021-02-24 NOTE — Patient Instructions (Addendum)
Good to see you Xray today Exercises for the shoulder 3 ibuprofen 3 times a day for 3 days Keep hands within peripheral vision Arm compression sleeve See me again as scheduled

## 2021-02-26 ENCOUNTER — Other Ambulatory Visit: Payer: Self-pay | Admitting: Family Medicine

## 2021-02-26 MED ORDER — PREDNISONE 20 MG PO TABS: 20.0000 mg | ORAL_TABLET | Freq: Every day | ORAL | 0 refills | Status: DC

## 2021-02-26 NOTE — Progress Notes (Signed)
Still pain in shoulder, sent in prednisone, told not to take NSAIDs with it

## 2021-03-09 NOTE — Progress Notes (Signed)
[place] Phone: [phone] Subjective:   [name], am serving as a scribe for Dr. Hulan Saas. This visit occurred during the SARS-CoV-2 public health emergency.  Safety protocols were in place, including screening questions prior to the visit, additional usage of staff PPE, and extensive cleaning of exam room while observing appropriate contact time as indicated for disinfecting solutions.   I'm seeing this patient by the request  of:  [name], MD  CC: Left shoulder pain follow-up, neck and back pain follow-up recent allergies  [id number]   02/24/2021 Patient on ultrasound today did have some mild chronic changes of the supraspinatus but no true acute tear noted.  Patient did have hypoechoic changes of the bicep tendon noted.  Patient given exercises.  Patient did have some swelling of the glenohumeral joint injection given today.  Patient could have a potential labral pathology and does have acromioclavicular arthritis we will need to monitor as well.  Patient will do the home exercises, icing regimen, short course of anti-inflammatories, follow-up with me again as scheduled for his regular manipulation and we will further evaluate at that time  Update 03/10/2021 SEABORN NAKAMA is a 65 y.o. male coming in with complaint of L shoulder pain. Patient states that his pain is over superior aspect of his L shoulder. Pain with hitting golf balls.  Patient also notes headache and head congestion.   Taking nexium BID. How long can he use this medication.       Past Medical History:  Diagnosis Date  . Allergy    RHINITIS  . Anemia   . Arthritis   . Bursitis of left foot 11/11/2016  . Colonic polyp 09/2006   colonoscopy  . Ganglion 11/02/2017  . GERD (gastroesophageal reflux disease)   . Golfer's elbow, right 09/25/2019  . Gout   . Heart beat abnormality 12/14/2017  . Heart murmur   . Herpes  genitalis in men 02/14/2014  . Hypertension   . Osteitis pubis ([place]) 01/27/2016  . Primary osteoarthritis of left knee 11/06/2017  . Right groin pain 11/08/2018  . SOB (shortness of breath)    per pt, may be coming from new medication  . Subluxation of distal radial-ulnar joint 12/03/2019  . Subluxation of tendon of long head of biceps 10/15/2016   Injected shoulder 12/19/2016  . Subungual hematoma of foot, initial encounter 05/29/2018  . SVT (supraventricular tachycardia) ([place]) 03/09/2020   Past Surgical History:  Procedure Laterality Date  . COLONOSCOPY  09/2006  . KNEE ARTHROSCOPY Left 2011  . KNEE SURGERY Left 08/2018   scar tissue surgery  . [place] SURGERY  1990  . SVT ABLATION N/A 01/09/2020   Procedure: SVT ABLATION;  Surgeon: Constance Haw, MD;  Location: [facility];  Service: Cardiovascular;  Laterality: N/A;  . TOTAL KNEE ARTHROPLASTY Left 11/06/2017   Procedure: TOTAL KNEE ARTHROPLASTY;  Surgeon: Dorna Leitz, MD;  Location: [facility];  Service: Orthopedics;  Laterality: Left;   Social History   Socioeconomic History  . Marital status: Married    Spouse name: Not on file  . Number of children: 1  . Years of education: Not on file  . Highest education level: Not on file  Occupational History  . Not on file  Tobacco Use  . Smoking status: Current Some Day Smoker    Types: Cigars  . Smokeless tobacco: Never Used  . Tobacco comment: occasional cigar  Vaping Use  . Vaping Use: Never used  Substance and Sexual Activity  . Alcohol use: Yes    Alcohol/week: 3.0 standard drinks    Types: 3 Standard drinks or equivalent per week    Comment: occ 2-3 beers  . Drug use: No  . Sexual activity: Yes  Other Topics Concern  . Not on file  Social History Narrative  . Not on file   Social Determinants of Health   Financial Resource Strain: Not on file  Food Insecurity: Not on file  Transportation Needs: Not on file  Physical Activity: Not on file  Stress: Not on  file  Social Connections: Not on file   Allergies  Allergen Reactions  . Beta Adrenergic Blockers Other (See Comments)    Fatigue  . Lisinopril Cough  . Asa [Aspirin] Rash    High dosage ASA causes break out  . Demerol Itching   Family History  Problem Relation Age of Onset  . Stroke Father   . Gout Father   . Depression Brother   . Dermatomyositis Daughter   . Colon cancer Neg Hx   . Rectal cancer Neg Hx   . Esophageal cancer Neg Hx   . Pancreatic cancer Neg Hx   . Stomach cancer Neg Hx   . Liver disease Neg Hx     Current Outpatient Medications (Endocrine & Metabolic):  .  predniSONE (DELTASONE) 20 MG tablet, Take 1 tablet (20 mg total) by mouth daily with breakfast.  Current Outpatient Medications (Cardiovascular):  .  diltiazem (CARDIZEM CD) 360 MG 24 hr capsule, Take 1 capsule (360 mg total) by mouth daily. [name]  losartan-hydrochlorothiazide (HYZAAR) 50-12.5 MG tablet, TAKE 1 TABLET BY MOUTH EVERY DAY .  rosuvastatin (CRESTOR) 20 MG tablet, TAKE 1 TABLET BY MOUTH EVERY DAY  Current Outpatient Medications (Respiratory):  .  fexofenadine (ALLEGRA) 180 MG tablet, Take 180 mg by mouth at bedtime.  .  fluticasone (FLONASE) 50 MCG/ACT nasal spray, Place into the nose.  Current Outpatient Medications (Analgesics):  .  allopurinol (ZYLOPRIM) 300 MG tablet, Take 1 tablet (300 mg total) by mouth daily. [name]  aspirin EC 81 MG tablet, Take 81 mg by mouth daily. .  meloxicam (MOBIC) 15 MG tablet, TAKE 1 TABLET BY MOUTH EVERY DAY  Current Outpatient Medications (Hematological):  [name]  Ferrous Sulfate (IRON) 325 (65 FE) MG TABS, Take 325 mg by mouth daily.   Current Outpatient Medications (Other):  [name]  AMBULATORY NON FORMULARY MEDICATION, GI cocktail: 90 ml vicous lidocaine , 90 ml 10mg /62ml dicyclomine , 270 ml maalox 5-10 ml every 4 hours before breakfast .  amoxicillin (AMOXIL) 500 MG capsule, Take 2,000 mg by mouth once. Prior to dental work .  amoxicillin-clavulanate (AUGMENTIN)  875-125 MG tablet, Take 1 tablet by mouth 2 (two) times daily. .  cholecalciferol (VITAMIN D) 1000 units tablet, Take 1,000 Units by mouth daily. [name]  dicyclomine (BENTYL) 10 MG/5ML solution,  .  esomeprazole (NEXIUM) 40 MG capsule, TAKE 1 CAPSULE (40 MG TOTAL) BY MOUTH 2 (TWO) TIMES DAILY BEFORE A MEAL. TAKE ONE TABLET 30-60 MINUTES BEFORE BREAKFAST .  fish oil-omega-3 fatty acids 1000 MG capsule, Take 3,600 g by mouth daily. [name]  gabapentin (NEURONTIN) 100 MG capsule, TAKE 2 CAPSULES (200 MG TOTAL) BY MOUTH AT BEDTIME. [name]  Misc Natural Products (TART CHERRY ADVANCED PO), Take 3,600 mg by mouth daily. .  Multiple Vitamin (MULTIVITAMIN) tablet, Take 1 tablet by mouth daily. .  sucralfate (CARAFATE) 1 g tablet, Take 1  tablet (1 g total) by mouth 4 (four) times daily. Take one tablet 30-60 minutes before breakfast. .  triamcinolone (KENALOG) 0.1 % paste, SMARTSIG:1 TO TEETH Every Night .  valACYclovir (VALTREX) 500 MG tablet, Take 500 mg by mouth daily.   Reviewed prior external information including notes and imaging from  primary care provider As well as notes that were available from care everywhere and other healthcare systems.  Past medical history, social, surgical and family history all reviewed in electronic medical record.  No pertanent information unless stated regarding to the chief complaint.   Review of Systems:  No  visual changes, nausea, vomiting, diarrhea, constipation, dizziness, abdominal pain, skin rash, fevers, chills, night sweats, weight loss, swollen lymph nodes, body aches, joint swelling, chest pain, shortness of breath, mood changes. POSITIVE muscle aches, body aches, headache, cough  Objective  Blood pressure (!) 144/84, pulse 86, height 6' (1.829 m), weight 194 lb (88 kg), SpO2 98 %.   General: No apparent distress alert and oriented x3 mood and affect normal, dressed appropriately.  HEENT: Pupils equal, extraocular movements intact patient does have significant  tenderness noted of the frontal and maxillary sinuses. Respiratory: Patient's speak in full sentences and does not appear short of breath  Cardiovascular: No lower extremity edema, non tender, no erythema  Gait normal with good balance and coordination.  MSK: Left shoulder exam he still has some pain with empty can.  Positive impingement noted.  Mild positive O'Brien's still noted.  Limited range of motion in external rotation more than previous exam. Neck exam does have some mild loss of lordosis.  Tightness around the left parascapular region.  Tightness noted with sidebending bilaterally.  Mild shotty lymph nodes noted of the posterior chain  Osteopathic findings C3 flexed rotated and side bent left T3 extended rotated and side bent left with inhaled rib   Impression and Recommendations:      The above documentation has been reviewed and is accurate and complete Lyndal Pulley, DO

## 2021-03-10 ENCOUNTER — Other Ambulatory Visit: Payer: Self-pay

## 2021-03-10 ENCOUNTER — Ambulatory Visit: Payer: Self-pay

## 2021-03-10 ENCOUNTER — Ambulatory Visit: Payer: 59 | Admitting: Family Medicine

## 2021-03-10 ENCOUNTER — Encounter: Payer: Self-pay | Admitting: Family Medicine

## 2021-03-10 VITALS — BP 144/84 | HR 86 | Ht 72.0 in | Wt 194.0 lb

## 2021-03-10 DIAGNOSIS — M25512 Pain in left shoulder: Secondary | ICD-10-CM | POA: Diagnosis not present

## 2021-03-10 DIAGNOSIS — M9901 Segmental and somatic dysfunction of cervical region: Secondary | ICD-10-CM

## 2021-03-10 DIAGNOSIS — M542 Cervicalgia: Secondary | ICD-10-CM

## 2021-03-10 DIAGNOSIS — G8929 Other chronic pain: Secondary | ICD-10-CM

## 2021-03-10 DIAGNOSIS — M9908 Segmental and somatic dysfunction of rib cage: Secondary | ICD-10-CM | POA: Diagnosis not present

## 2021-03-10 DIAGNOSIS — J324 Chronic pansinusitis: Secondary | ICD-10-CM

## 2021-03-10 DIAGNOSIS — M9902 Segmental and somatic dysfunction of thoracic region: Secondary | ICD-10-CM

## 2021-03-10 DIAGNOSIS — M999 Biomechanical lesion, unspecified: Secondary | ICD-10-CM | POA: Diagnosis not present

## 2021-03-10 DIAGNOSIS — M25511 Pain in right shoulder: Secondary | ICD-10-CM

## 2021-03-10 MED ORDER — AMOXICILLIN-POT CLAVULANATE 875-125 MG PO TABS: 1.0000 | ORAL_TABLET | Freq: Two times a day (BID) | ORAL | 0 refills | Status: DC

## 2021-03-10 NOTE — Assessment & Plan Note (Signed)
Concerned that patient does have a potential for a rotator cuff tear as well as labral pathology.  Patient is having some decreasing in range of motion at this point.  Failed conservative therapy and do feel advanced imaging with an MR arthrogram is necessary.  Patient would be a surgical candidate if necessary.  Depending on findings we will discuss other treatment options as well as such as PRP.

## 2021-03-10 NOTE — Assessment & Plan Note (Signed)
Continued tightness.  Discussed icing regimen and home exercises, discussed which activities to do which wants to avoid.  Responded well to manipulation.  Follow-up again in 6 to 8 weeks

## 2021-03-10 NOTE — Patient Instructions (Addendum)
Augmentin for next 10 days MRA L shoulder [phone] See me in 6 weeks

## 2021-03-10 NOTE — Assessment & Plan Note (Signed)
Augmentin given.  Warned of potential side effects.  Patient will be traveling and hopefully this will be beneficial.  Follow-up again with primary care provider.

## 2021-03-10 NOTE — Assessment & Plan Note (Signed)
   Decision today to treat with OMT was based on Physical Exam  After verbal consent patient was treated with HVLA, ME, FPR techniques in cervical, thoracic, rib,  areas, all areas are chronic   Patient tolerated the procedure well with improvement in symptoms  Patient given exercises, stretches and lifestyle modifications  See medications in patient instructions if given  Patient will follow up in 6 weeks 

## 2021-03-13 ENCOUNTER — Other Ambulatory Visit: Payer: Self-pay | Admitting: Family Medicine

## 2021-03-13 DIAGNOSIS — M109 Gout, unspecified: Secondary | ICD-10-CM

## 2021-03-15 NOTE — Telephone Encounter (Signed)
Looks like pt has new PCP please advise [facility]

## 2021-03-15 NOTE — Telephone Encounter (Signed)
Call and find out if he has switched to a different provider

## 2021-03-15 NOTE — Telephone Encounter (Signed)
Yes, per pt has new doctor. Matthew [name]

## 2021-03-25 ENCOUNTER — Other Ambulatory Visit: Payer: Self-pay | Admitting: Family Medicine

## 2021-03-25 MED ORDER — DIAZEPAM 5 MG PO TABS: ORAL_TABLET | ORAL | 0 refills | Status: DC

## 2021-03-25 NOTE — Progress Notes (Signed)
Patient getting MRI Sent in valium to take as instructed.

## 2021-03-30 ENCOUNTER — Other Ambulatory Visit: Payer: Self-pay

## 2021-03-30 ENCOUNTER — Ambulatory Visit
Admission: RE | Admit: 2021-03-30 | Discharge: 2021-03-30 | Disposition: A | Discharge status: Home / Self Care | Payer: 59 | Source: Ambulatory Visit | Attending: Family Medicine | Admitting: Family Medicine

## 2021-03-30 DIAGNOSIS — M25512 Pain in left shoulder: Secondary | ICD-10-CM

## 2021-03-30 DIAGNOSIS — G8929 Other chronic pain: Secondary | ICD-10-CM

## 2021-03-30 MED ORDER — IOPAMIDOL (ISOVUE-M 200) INJECTION 41%
12.0000 mL | Freq: Once | INTRAMUSCULAR | Status: AC
  Administered 2021-03-30: 12 mL via INTRA_ARTICULAR

## 2021-04-02 ENCOUNTER — Encounter: Payer: Self-pay | Admitting: Internal Medicine

## 2021-04-02 ENCOUNTER — Encounter: Payer: Self-pay | Admitting: Family Medicine

## 2021-04-02 ENCOUNTER — Other Ambulatory Visit: Payer: Self-pay

## 2021-04-02 MED ORDER — SUCRALFATE 1 G PO TABS: 1.0000 g | ORAL_TABLET | Freq: Four times a day (QID) | ORAL | 3 refills | Status: DC

## 2021-04-02 MED ORDER — FAMOTIDINE 40 MG PO TABS: 40.0000 mg | ORAL_TABLET | Freq: Every day | ORAL | 3 refills | Status: DC

## 2021-04-05 ENCOUNTER — Telehealth: Payer: Self-pay

## 2021-04-05 MED ORDER — FAMOTIDINE 40 MG PO TABS: 40.0000 mg | ORAL_TABLET | Freq: Two times a day (BID) | ORAL | 3 refills | Status: DC

## 2021-04-05 NOTE — Telephone Encounter (Signed)
Spoke with patient in regards to recommendations per Anderson Malta. Patient advised to call us in 2 weeks with an update, advised that if he is still not doing well at that time we will need to get him scheduled for an appt. Patient thanked me for the call. He verbalized understanding of all information and had no concerns at the end of the call.

## 2021-04-05 NOTE — Telephone Encounter (Signed)
-----   Message from Levin Erp, [place] sent at 04/02/2021 11:35 AM EDT ----- Regarding: GERD Can you call patient, he contacted [place] in regards to worsening symptoms.  Please make sure that he is on his Nexium 40 mg twice daily.  Please ask if he is on Carafate.  If he is not we can represcribe this 1 g 4 times daily, 20 to 30 minutes before meals and at bedtime.  #120 with 2 refills.  Also would recommend that if he is not on it that he take Pepcid 40 mg every morning and nightly #60 with 3 refills.  He should call and let us know how he is doing in the next 2 weeks.  If he is not any better he will need to be seen in clinic.  Thank you, Ellouise Newer, PA-C

## 2021-04-10 ENCOUNTER — Other Ambulatory Visit: Payer: Self-pay | Admitting: Cardiology

## 2021-04-10 DIAGNOSIS — R931 Abnormal findings on diagnostic imaging of heart and coronary circulation: Secondary | ICD-10-CM

## 2021-04-12 ENCOUNTER — Other Ambulatory Visit: Payer: Self-pay | Admitting: Cardiology

## 2021-04-14 NOTE — Progress Notes (Signed)
[place] Phone: [phone] Subjective:   [name], am serving as a scribe for Dr. Hulan Saas.  This visit occurred during the SARS-CoV-2 public health emergency.  Safety protocols were in place, including screening questions prior to the visit, additional usage of staff PPE, and extensive cleaning of exam room while observing appropriate contact time as indicated for disinfecting solutions.    I'm seeing this patient by the request  of:  [name], MD  CC: Left shoulder pain, neck pain follow-up  [id number]  03/10/2021 Concerned that patient does have a potential for a rotator cuff tear as well as labral pathology.  Patient is having some decreasing in range of motion at this point.  Failed conservative therapy and do feel advanced imaging with an MR arthrogram is necessary.  Patient would be a surgical candidate if necessary.  Depending on findings we will discuss other treatment options as well as such as PRP. Update 04/15/2021 BUELL PARCEL is a 65 y.o. male coming in with complaint of L shoulder pain. Patient states that his shoulder continues to ache. At night he has hard time sleeping due to pain. Has not noticed a difference in strength in shoulder.   MRI L shoulder 03/30/2021 IMPRESSION: Rotator cuff tendinopathy without tear appears worst in the supraspinatus.   Moderate acromioclavicular osteoarthritis.   Subacromial/subdeltoid bursitis.     Past Medical History:  Diagnosis Date   Allergy    RHINITIS   Anemia    Arthritis    Bursitis of left foot 11/11/2016   Colonic polyp 09/2006   colonoscopy   Ganglion 11/02/2017   GERD (gastroesophageal reflux disease)    Golfer's elbow, right 09/25/2019   Gout    Heart beat abnormality 12/14/2017   Heart murmur    Herpes genitalis in men 02/14/2014   Hypertension    Osteitis pubis ([place]) 01/27/2016   Primary osteoarthritis of left knee  11/06/2017   Right groin pain 11/08/2018   SOB (shortness of breath)    per pt, may be coming from new medication   Subluxation of distal radial-ulnar joint 12/03/2019   Subluxation of tendon of long head of biceps 10/15/2016   Injected shoulder 12/19/2016   Subungual hematoma of foot, initial encounter 05/29/2018   SVT (supraventricular tachycardia) ([place]) 03/09/2020   Past Surgical History:  Procedure Laterality Date   COLONOSCOPY  09/2006   KNEE ARTHROSCOPY Left 2011   KNEE SURGERY Left 08/2018   scar tissue surgery   LUMBAR DISC SURGERY  1990   SVT ABLATION N/A 01/09/2020   Procedure: SVT ABLATION;  Surgeon: Constance Haw, MD;  Location: [facility];  Service: Cardiovascular;  Laterality: N/A;   TOTAL KNEE ARTHROPLASTY Left 11/06/2017   Procedure: TOTAL KNEE ARTHROPLASTY;  Surgeon: Dorna Leitz, MD;  Location: [facility];  Service: Orthopedics;  Laterality: Left;   Social History   Socioeconomic History   Marital status: Married    Spouse name: Not on file   Number of children: 1   Years of education: Not on file   Highest education level: Not on file  Occupational History   Not on file  Tobacco Use   Smoking status: Some Days    Pack years: 0.00    Types: Cigars   Smokeless tobacco: Never   Tobacco comments:    occasional cigar  Vaping Use   Vaping Use: Never used  Substance and Sexual Activity  Alcohol use: Yes    Alcohol/week: 3.0 standard drinks    Types: 3 Standard drinks or equivalent per week    Comment: occ 2-3 beers   Drug use: No   Sexual activity: Yes  Other Topics Concern   Not on file  Social History Narrative   Not on file   Social Determinants of Health   Financial Resource Strain: Not on file  Food Insecurity: Not on file  Transportation Needs: Not on file  Physical Activity: Not on file  Stress: Not on file  Social Connections: Not on file   Allergies  Allergen Reactions   Beta Adrenergic Blockers Other (See Comments)    Fatigue    Lisinopril Cough   Asa [Aspirin] Rash    High dosage ASA causes break out   Demerol Itching   Family History  Problem Relation Age of Onset   Stroke Father    Gout Father    Depression Brother    Dermatomyositis Daughter    Colon cancer Neg Hx    Rectal cancer Neg Hx    Esophageal cancer Neg Hx    Pancreatic cancer Neg Hx    Stomach cancer Neg Hx    Liver disease Neg Hx     Current Outpatient Medications (Endocrine & Metabolic):    predniSONE (DELTASONE) 20 MG tablet, Take 1 tablet (20 mg total) by mouth daily with breakfast.  Current Outpatient Medications (Cardiovascular):    diltiazem (CARDIZEM CD) 360 MG 24 hr capsule, TAKE 1 CAPSULE BY MOUTH EVERY DAY   losartan-hydrochlorothiazide (HYZAAR) 50-12.5 MG tablet, TAKE 1 TABLET BY MOUTH EVERY DAY   rosuvastatin (CRESTOR) 20 MG tablet, TAKE 1 TABLET BY MOUTH EVERY DAY  Current Outpatient Medications (Respiratory):    fexofenadine (ALLEGRA) 180 MG tablet, Take 180 mg by mouth at bedtime.    fluticasone (FLONASE) 50 MCG/ACT nasal spray, Place into the nose.  Current Outpatient Medications (Analgesics):    allopurinol (ZYLOPRIM) 300 MG tablet, Take 1 tablet (300 mg total) by mouth daily.   aspirin EC 81 MG tablet, Take 81 mg by mouth daily.   meloxicam (MOBIC) 15 MG tablet, TAKE 1 TABLET BY MOUTH EVERY DAY  Current Outpatient Medications (Hematological):    Ferrous Sulfate (IRON) 325 (65 FE) MG TABS, Take 325 mg by mouth daily.   Current Outpatient Medications (Other):    AMBULATORY NON FORMULARY MEDICATION, GI cocktail: 90 ml vicous lidocaine , 90 ml 10mg /82ml dicyclomine , 270 ml maalox 5-10 ml every 4 hours before breakfast   amoxicillin-clavulanate (AUGMENTIN) 875-125 MG tablet, Take 1 tablet by mouth 2 (two) times daily.   cholecalciferol (VITAMIN D) 1000 units tablet, Take 1,000 Units by mouth daily.   diazepam (VALIUM) 5 MG tablet, One tab by mouth, 2 hours before procedure.   dicyclomine (BENTYL) 10 MG/5ML solution,     esomeprazole (NEXIUM) 40 MG capsule, TAKE 1 CAPSULE (40 MG TOTAL) BY MOUTH 2 (TWO) TIMES DAILY BEFORE A MEAL. TAKE ONE TABLET 30-60 MINUTES BEFORE BREAKFAST   famotidine (PEPCID) 40 MG tablet, Take 1 tablet (40 mg total) by mouth 2 (two) times daily. Take 1 tablet every morning and every night   fish oil-omega-3 fatty acids 1000 MG capsule, Take 3,600 g by mouth daily.   gabapentin (NEURONTIN) 100 MG capsule, TAKE 2 CAPSULES (200 MG TOTAL) BY MOUTH AT BEDTIME.   Misc Natural Products (TART CHERRY ADVANCED PO), Take 3,600 mg by mouth daily.   Multiple Vitamin (MULTIVITAMIN) tablet, Take 1 tablet by mouth daily.  sucralfate (CARAFATE) 1 g tablet, Take 1 tablet (1 g total) by mouth 4 (four) times daily. Take one tablet 30-60 minutes before breakfast.   triamcinolone (KENALOG) 0.1 % paste, SMARTSIG:1 TO TEETH Every Night   valACYclovir (VALTREX) 500 MG tablet, Take 500 mg by mouth daily.   Reviewed prior external information including notes and imaging from  primary care provider As well as notes that were available from care everywhere and other healthcare systems.  Past medical history, social, surgical and family history all reviewed in electronic medical record.  No pertanent information unless stated regarding to the chief complaint.   Review of Systems:  No headache, visual changes, nausea, vomiting, diarrhea, constipation, dizziness, abdominal pain, skin rash, fevers, chills, night sweats, weight loss, swollen lymph nodes, body aches, joint swelling, chest pain, shortness of breath, mood changes. POSITIVE muscle aches  Objective  Blood pressure 120/84, pulse 70, height 6' (1.829 m), weight 194 lb (88 kg), SpO2 97 %.   General: No apparent distress alert and oriented x3 mood and affect normal, dressed appropriately.  HEENT: Pupils equal, extraocular movements intact  Respiratory: Patient's speak in full sentences and does not appear short of breath  Cardiovascular: No lower extremity  edema, non tender, no erythema  Gait normal with good balance and coordination.  MSK: Left shoulder exam shows the patient has very mild positive impingement noted.  Rotator cuff strength is 5 out of 5.  Patient does have mild positive crossover. Neck exam some mild loss of lordosis.  Some tenderness to palpation in the paraspinal musculature.  Negative Spurling's.  Some mild limited sidebending bilaterally left greater than right  Limited musculoskeletal ultrasound was performed and interpreted by Lyndal Pulley  Limited ultrasound of patient's left shoulder shows the patient still has some mild calcific bursitis noted of the subacromial space patient's rotator cuff does appear to be intact.  Patient does have moderate arthritic changes with mild effusion of the acromioclavicular joint.  Please note that patient's left elbow did show some mild hypoechoic changes and increase in Doppler flow of the common extensor tendon at the lateral epicondylar region.  Impression: Mild calcific bursitis still noted of the shoulder but improvement. Mild lateral epicondylitis of the left elbow    Impression and Recommendations:     The above documentation has been reviewed and is accurate and complete Lyndal Pulley, DO

## 2021-04-15 ENCOUNTER — Encounter: Payer: Self-pay | Admitting: Family Medicine

## 2021-04-15 ENCOUNTER — Other Ambulatory Visit: Payer: Self-pay

## 2021-04-15 ENCOUNTER — Ambulatory Visit (INDEPENDENT_AMBULATORY_CARE_PROVIDER_SITE_OTHER): Payer: 59 | Admitting: Family Medicine

## 2021-04-15 ENCOUNTER — Ambulatory Visit: Payer: Self-pay

## 2021-04-15 VITALS — BP 120/84 | HR 70 | Ht 72.0 in | Wt 194.0 lb

## 2021-04-15 DIAGNOSIS — M542 Cervicalgia: Secondary | ICD-10-CM

## 2021-04-15 DIAGNOSIS — M25512 Pain in left shoulder: Secondary | ICD-10-CM | POA: Diagnosis not present

## 2021-04-15 DIAGNOSIS — M9902 Segmental and somatic dysfunction of thoracic region: Secondary | ICD-10-CM

## 2021-04-15 DIAGNOSIS — M9901 Segmental and somatic dysfunction of cervical region: Secondary | ICD-10-CM

## 2021-04-15 DIAGNOSIS — M7712 Lateral epicondylitis, left elbow: Secondary | ICD-10-CM

## 2021-04-15 DIAGNOSIS — M9903 Segmental and somatic dysfunction of lumbar region: Secondary | ICD-10-CM

## 2021-04-15 DIAGNOSIS — M9908 Segmental and somatic dysfunction of rib cage: Secondary | ICD-10-CM

## 2021-04-15 DIAGNOSIS — M9904 Segmental and somatic dysfunction of sacral region: Secondary | ICD-10-CM

## 2021-04-15 NOTE — Assessment & Plan Note (Signed)
Appears to be more of a lateral epicondylitis.  Has had golfers elbow on the contralateral side.  Continue to stay active.  Likely secondary to compensating for the shoulder.  We discussed avoiding certain activities.  Follow-up with me again 6 weeks

## 2021-04-15 NOTE — Assessment & Plan Note (Signed)
Continues to have some muscle imbalances and some mild underlying arthritic changes.  Does respond well to osteopathic manipulation.  Patient did respond well again today.  Discussed posture and ergonomics.  Patient does not do a decent amount of driving traveling.  Encouraged him to continue this even when he is on the road.  Continue the same medications and we did do a medical reconciliation and discussed him in using meloxicam and more 5 to 7-day burst.  Follow-up 6 weeks

## 2021-04-15 NOTE — Assessment & Plan Note (Signed)
Significant improvement overall.  Discussed regimen of exercise, patient has been continue to stay active.  We discussed with patient that we can consider repeating injection if necessary.  We will consider this at follow-up.  Patient's MRI though was encouraging.

## 2021-04-15 NOTE — Patient Instructions (Signed)
Let's give shoulder more time Avoid overhand lifting with left wrist See me in 6-7 weeks

## 2021-04-17 ENCOUNTER — Other Ambulatory Visit: Payer: Self-pay | Admitting: Family Medicine

## 2021-04-18 ENCOUNTER — Other Ambulatory Visit: Payer: Self-pay | Admitting: Family Medicine

## 2021-04-18 MED ORDER — GABAPENTIN 100 MG PO CAPS: 200.0000 mg | ORAL_CAPSULE | Freq: Every day | ORAL | 0 refills | Status: DC

## 2021-04-18 NOTE — Progress Notes (Signed)
[place]

## 2021-04-19 ENCOUNTER — Telehealth: Payer: Self-pay

## 2021-04-19 NOTE — Telephone Encounter (Signed)
-----   Message from Yevette Edwards, RN sent at 04/05/2021  1:00 PM EDT ----- Regarding: Symptom Update Symptom update, see 04/05/21 telephone encounter for more information.

## 2021-04-19 NOTE — Telephone Encounter (Signed)
Spoke with patient to see how he has been feeling. Patient states that the medication regimen seems to be working, he states that he still has a slight cough but other than that everything is good. Advised patient to continue current regimen and if he has any concerns to give [place] a call back. Patient verbalized understanding and had no concerns at the end of the call.

## 2021-05-04 ENCOUNTER — Other Ambulatory Visit: Payer: Self-pay | Admitting: Family Medicine

## 2021-05-04 DIAGNOSIS — M109 Gout, unspecified: Secondary | ICD-10-CM

## 2021-05-05 NOTE — Telephone Encounter (Signed)
Please advise if ok to fill. Looks like pt has new PCP. [place]

## 2021-05-18 ENCOUNTER — Encounter: Payer: Self-pay | Admitting: Family Medicine

## 2021-05-18 ENCOUNTER — Ambulatory Visit: Payer: Self-pay

## 2021-05-18 ENCOUNTER — Ambulatory Visit: Payer: 59 | Admitting: Family Medicine

## 2021-05-18 ENCOUNTER — Ambulatory Visit (INDEPENDENT_AMBULATORY_CARE_PROVIDER_SITE_OTHER): Payer: 59

## 2021-05-18 ENCOUNTER — Other Ambulatory Visit: Payer: Self-pay

## 2021-05-18 VITALS — BP 142/90 | HR 77 | Wt 193.0 lb

## 2021-05-18 DIAGNOSIS — M25552 Pain in left hip: Secondary | ICD-10-CM

## 2021-05-18 DIAGNOSIS — R1032 Left lower quadrant pain: Secondary | ICD-10-CM | POA: Diagnosis not present

## 2021-05-18 NOTE — Patient Instructions (Addendum)
Good to see you Left him xray Thigh compression sleeve  600-800 mg ibuprofen every 8 hours Tylenol 650 mg every 8 hours alternate with ibuprofen No high impact exercise

## 2021-05-18 NOTE — Progress Notes (Signed)
Matthew [name] Phone: [phone] Subjective:    I'm seeing this patient by the request  of:  [name], MD  CC: Left groin pain  Matthew [name]  Matthew [name] is a 65 y.o. male coming in with complaint of back and neck pain. OMT 04/15/2021. Patient states that he has had pain in L groin 2-3 weeks ago. Pain is stabbing and burning and gets worse at night. Using Advil for pain relief.   Medications patient has been prescribed:   Taking:         Reviewed prior external information including notes and imaging from previsou exam, outside providers and external EMR if available.   As well as notes that were available from care everywhere and other healthcare systems.  Past medical history, social, surgical and family history all reviewed in electronic medical record.  No pertanent information unless stated regarding to the chief complaint.   Past Medical History:  Diagnosis Date   Allergy    RHINITIS   Anemia    Arthritis    Bursitis of left foot 11/11/2016   Colonic polyp 09/2006   colonoscopy   Ganglion 11/02/2017   GERD (gastroesophageal reflux disease)    Golfer's elbow, right 09/25/2019   Gout    Heart beat abnormality 12/14/2017   Heart murmur    Herpes genitalis in men 02/14/2014   Hypertension    Osteitis pubis ([place]) 01/27/2016   Primary osteoarthritis of left knee 11/06/2017   Right groin pain 11/08/2018   SOB (shortness of breath)    per pt, may be coming from new medication   Subluxation of distal radial-ulnar joint 12/03/2019   Subluxation of tendon of long head of biceps 10/15/2016   Injected shoulder 12/19/2016   Subungual hematoma of foot, initial encounter 05/29/2018   SVT (supraventricular tachycardia) (HCC) 03/09/2020    Allergies  Allergen Reactions   Beta Adrenergic Blockers Other (See Comments)    Fatigue   Lisinopril Cough   Asa [Aspirin] Rash    High dosage ASA causes break out    Demerol Itching     Review of Systems:  No headache, visual changes, nausea, vomiting, diarrhea, constipation, dizziness, abdominal pain, skin rash, fevers, chills, night sweats, weight loss, swollen lymph nodes, body aches, joint swelling, chest pain, shortness of breath, mood changes. POSITIVE muscle aches  Objective  Blood pressure (!) 142/90, pulse 77, weight 193 lb (87.5 kg), SpO2 98 %.   General: No apparent distress alert and oriented x3 mood and affect normal, dressed appropriately.  HEENT: Pupils equal, extraocular movements intact  Respiratory: Patient's speak in full sentences and does not appear short of breath  Cardiovascular: No lower extremity edema, non tender, no erythema  Severely antalgic gait favoring the left hip. Left hip exam does have decreased range of motion especially compared to the contralateral side with only 5 degrees of internal rotation and only 15 degrees of external rotation.  Patient does have severe pain now with adduction of the hip against resistance.    Limited musculoskeletal ultrasound was performed and interpreted by Lyndal Pulley  Limited ultrasound of patient's left groin area shows that there is some hypoechoic changes over the abductor muscle group.  No true tear appreciated.  Patient noted does have very mild narrowing of the hip joint itself articular cartilage still noted.  No significant effusion of the joint noted.   Assessment and Plan:  Severe left groin pain Patient  is having severe left groin pain.  No true mass appreciated any type of hernia.  Patient seems to have more of a muscle injury noted.  He does have some limited range of motion but on ultrasound does not appear to have significant arthritic changes.  No awaiting x-ray at this time.  Discussed diet compression sleeve.  Discussed home exercises and icing regimen.  Increase activity slowly.  Follow-up again with me in 3-4 weeks.      The above documentation has been  reviewed and is accurate and complete Lyndal Pulley, DO        Note: This dictation was prepared with Dragon dictation along with smaller phrase technology. Any transcriptional errors that result from this process are unintentional.

## 2021-05-18 NOTE — Assessment & Plan Note (Signed)
Patient is having severe left groin pain.  No true mass appreciated any type of hernia.  Patient seems to have more of a muscle injury noted.  He does have some limited range of motion but on ultrasound does not appear to have significant arthritic changes.  No awaiting x-ray at this time.  Discussed diet compression sleeve.  Discussed home exercises and icing regimen.  Increase activity slowly.  Follow-up again with me in 3-4 weeks.

## 2021-05-19 ENCOUNTER — Encounter: Payer: Self-pay | Admitting: Family Medicine

## 2021-05-25 ENCOUNTER — Ambulatory Visit: Payer: 59 | Admitting: Family Medicine

## 2021-05-27 NOTE — Progress Notes (Signed)
[name] [place] Phone: [phone] Subjective:   Matthew [name], am serving as a scribe for Dr. Hulan Saas.  I'm seeing this patient by the request  of:  [name], MD  CC: Left groin pain, left shoulder pain follow-up  [id number]  05/18/2021 Patient is having severe left groin pain.  No true mass appreciated any type of hernia.  Patient seems to have more of a muscle injury noted.  He does have some limited range of motion but on ultrasound does not appear to have significant arthritic changes.  No awaiting x-ray at this time.  Discussed diet compression sleeve.  Discussed home exercises and icing regimen.  Increase activity slowly.  Follow-up again with me in 3-4 weeks.  Update 06/01/2021 Matthew [name] is a 65 y.o. male coming in with complaint of L groin pain.  Patient states during the day had been making some improvement but unfortunately continues to have discomfort at night.  Patient has been doing Advil and Tylenol very regularly.  Patient states that it wakes him up at night where he does get up 3-5 times at night.  Patient was to increase gabapentin to 300 mg.  Patient states wants to catch up on his Right hand middle finger trigger finger and talk about some of the medications he is taking.       Past Medical History:  Diagnosis Date   Allergy    RHINITIS   Anemia    Arthritis    Bursitis of left foot 11/11/2016   Colonic polyp 09/2006   colonoscopy   Ganglion 11/02/2017   GERD (gastroesophageal reflux disease)    Golfer's elbow, right 09/25/2019   Gout    Heart beat abnormality 12/14/2017   Heart murmur    Herpes genitalis in men 02/14/2014   Hypertension    Osteitis pubis ([place]) 01/27/2016   Primary osteoarthritis of left knee 11/06/2017   Right groin pain 11/08/2018   SOB (shortness of breath)    per pt, may be coming from new medication   Subluxation of distal radial-ulnar joint 12/03/2019    Subluxation of tendon of long head of biceps 10/15/2016   Injected shoulder 12/19/2016   Subungual hematoma of foot, initial encounter 05/29/2018   SVT (supraventricular tachycardia) ([place]) 03/09/2020   Past Surgical History:  Procedure Laterality Date   COLONOSCOPY  09/2006   KNEE ARTHROSCOPY Left 2011   KNEE SURGERY Left 08/2018   scar tissue surgery   LUMBAR DISC SURGERY  1990   SVT ABLATION N/A 01/09/2020   Procedure: SVT ABLATION;  Surgeon: Constance Haw, MD;  Location: [facility];  Service: Cardiovascular;  Laterality: N/A;   TOTAL KNEE ARTHROPLASTY Left 11/06/2017   Procedure: TOTAL KNEE ARTHROPLASTY;  Surgeon: Dorna Leitz, MD;  Location: [facility];  Service: Orthopedics;  Laterality: Left;   Social History   Socioeconomic History   Marital status: Married    Spouse name: Not on file   Number of children: 1   Years of education: Not on file   Highest education level: Not on file  Occupational History   Not on file  Tobacco Use   Smoking status: Some Days    Types: Cigars   Smokeless tobacco: Never   Tobacco comments:    occasional cigar  Vaping Use   Vaping Use: Never used  Substance and Sexual Activity   Alcohol use: Yes    Alcohol/week: 3.0 standard drinks  Types: 3 Standard drinks or equivalent per week    Comment: occ 2-3 beers   Drug use: No   Sexual activity: Yes  Other Topics Concern   Not on file  Social History Narrative   Not on file   Social Determinants of Health   Financial Resource Strain: Not on file  Food Insecurity: Not on file  Transportation Needs: Not on file  Physical Activity: Not on file  Stress: Not on file  Social Connections: Not on file   Allergies  Allergen Reactions   Beta Adrenergic Blockers Other (See Comments)    Fatigue   Lisinopril Cough   Asa [Aspirin] Rash    High dosage ASA causes break out   Demerol Itching   Family History  Problem Relation Age of Onset   Stroke Father    Gout Father     Depression Brother    Dermatomyositis Daughter    Colon cancer Neg Hx    Rectal cancer Neg Hx    Esophageal cancer Neg Hx    Pancreatic cancer Neg Hx    Stomach cancer Neg Hx    Liver disease Neg Hx     Current Outpatient Medications (Endocrine & Metabolic):    predniSONE (DELTASONE) 20 MG tablet, Take 1 tablet (20 mg total) by mouth daily with breakfast.  Current Outpatient Medications (Cardiovascular):    diltiazem (CARDIZEM CD) 360 MG 24 hr capsule, TAKE 1 CAPSULE BY MOUTH EVERY DAY   losartan-hydrochlorothiazide (HYZAAR) 50-12.5 MG tablet, TAKE 1 TABLET BY MOUTH EVERY DAY   rosuvastatin (CRESTOR) 20 MG tablet, TAKE 1 TABLET BY MOUTH EVERY DAY  Current Outpatient Medications (Respiratory):    fexofenadine (ALLEGRA) 180 MG tablet, Take 180 mg by mouth at bedtime.    fluticasone (FLONASE) 50 MCG/ACT nasal spray, Place into the nose.  Current Outpatient Medications (Analgesics):    allopurinol (ZYLOPRIM) 300 MG tablet, Take 1 tablet (300 mg total) by mouth daily.   aspirin EC 81 MG tablet, Take 81 mg by mouth daily.   meloxicam (MOBIC) 15 MG tablet, TAKE 1 TABLET BY MOUTH EVERY DAY  Current Outpatient Medications (Hematological):    Ferrous Sulfate (IRON) 325 (65 FE) MG TABS, Take 325 mg by mouth daily.   Current Outpatient Medications (Other):    AMBULATORY NON FORMULARY MEDICATION, GI cocktail: 90 ml vicous lidocaine , 90 ml '10mg'$ /5m dicyclomine , 270 ml maalox 5-10 ml every 4 hours before breakfast   amoxicillin-clavulanate (AUGMENTIN) 875-125 MG tablet, Take 1 tablet by mouth 2 (two) times daily.   cholecalciferol (VITAMIN D) 1000 units tablet, Take 1,000 Units by mouth daily.   diazepam (VALIUM) 5 MG tablet, One tab by mouth, 2 hours before procedure.   dicyclomine (BENTYL) 10 MG/5ML solution,    esomeprazole (NEXIUM) 40 MG capsule, TAKE 1 CAPSULE (40 MG TOTAL) BY MOUTH 2 (TWO) TIMES DAILY BEFORE A MEAL. TAKE ONE TABLET 30-60 MINUTES BEFORE BREAKFAST   famotidine (PEPCID) 40  MG tablet, Take 1 tablet (40 mg total) by mouth 2 (two) times daily. Take 1 tablet every morning and every night   fish oil-omega-3 fatty acids 1000 MG capsule, Take 3,600 g by mouth daily.   gabapentin (NEURONTIN) 100 MG capsule, TAKE 2 CAPSULES BY MOUTH AT BEDTIME.   gabapentin (NEURONTIN) 100 MG capsule, Take 2 capsules (200 mg total) by mouth at bedtime.   Misc Natural Products (TART CHERRY ADVANCED PO), Take 3,600 mg by mouth daily.   Multiple Vitamin (MULTIVITAMIN) tablet, Take 1 tablet by mouth daily.  sucralfate (CARAFATE) 1 g tablet, Take 1 tablet (1 g total) by mouth 4 (four) times daily. Take one tablet 30-60 minutes before breakfast.   triamcinolone (KENALOG) 0.1 % paste, SMARTSIG:1 TO TEETH Every Night   valACYclovir (VALTREX) 500 MG tablet, Take 500 mg by mouth daily.   Reviewed prior external information including notes and imaging from  primary care provider As well as notes that were available from care everywhere and other healthcare systems.  Past medical history, social, surgical and family history all reviewed in electronic medical record.  No pertanent information unless stated regarding to the chief complaint.   Review of Systems:  No headache, visual changes, nausea, vomiting, diarrhea, constipation, dizziness, abdominal pain, skin rash, fevers, chills, night sweats, weight loss, swollen lymph nodes, body aches, joint swelling, chest pain, shortness of breath, mood changes. POSITIVE muscle aches  Objective  Blood pressure 132/82, pulse 60, height 6' (1.829 m), weight 196 lb (88.9 kg), SpO2 97 %.   General: No apparent distress alert and oriented x3 mood and affect normal, dressed appropriately.  HEENT: Pupils equal, extraocular movements intact  Respiratory: Patient's speak in full sentences and does not appear short of breath  Cardiovascular: No lower extremity edema, non tender, no erythema  Gait normal with good balance and coordination.  MSK: Right hip exam  shows some mild decrease in internal and external range of motion.  Patient does have tenderness of the left adductor muscle group. Patient's middle finger does have a trigger nodule noted at the A2 pulley.  Patient is Nontender at this time. Neck exam does have some loss of lordosis.  Tightness noted in the parascapular region right mild tightness noted also in the paraspinal musculature of the lumbar spine  Osteopathic findings C5 extended rotated and side T8 extended rotated and side bent left L3 flexed rotated and side bent left Sacrum right on right   Impression and Recommendations:     The above documentation has been reviewed and is accurate and complete Lyndal Pulley, DO

## 2021-05-28 ENCOUNTER — Encounter: Payer: Self-pay | Admitting: Family Medicine

## 2021-06-01 ENCOUNTER — Encounter: Payer: Self-pay | Admitting: Family Medicine

## 2021-06-01 ENCOUNTER — Ambulatory Visit: Payer: Self-pay

## 2021-06-01 ENCOUNTER — Other Ambulatory Visit: Payer: Self-pay

## 2021-06-01 ENCOUNTER — Ambulatory Visit: Payer: 59 | Admitting: Family Medicine

## 2021-06-01 VITALS — BP 132/82 | HR 60 | Ht 72.0 in | Wt 196.0 lb

## 2021-06-01 DIAGNOSIS — M79641 Pain in right hand: Secondary | ICD-10-CM

## 2021-06-01 DIAGNOSIS — M9904 Segmental and somatic dysfunction of sacral region: Secondary | ICD-10-CM | POA: Diagnosis not present

## 2021-06-01 DIAGNOSIS — M9901 Segmental and somatic dysfunction of cervical region: Secondary | ICD-10-CM | POA: Diagnosis not present

## 2021-06-01 DIAGNOSIS — R1032 Left lower quadrant pain: Secondary | ICD-10-CM

## 2021-06-01 DIAGNOSIS — M9903 Segmental and somatic dysfunction of lumbar region: Secondary | ICD-10-CM | POA: Diagnosis not present

## 2021-06-01 DIAGNOSIS — M65331 Trigger finger, right middle finger: Secondary | ICD-10-CM | POA: Diagnosis not present

## 2021-06-01 DIAGNOSIS — M9902 Segmental and somatic dysfunction of thoracic region: Secondary | ICD-10-CM | POA: Diagnosis not present

## 2021-06-01 NOTE — Assessment & Plan Note (Signed)
Groin pain again.  Moderate arthritis of the hip noted.  Could be contributing and playing a role.  Do believe though it is more secondary to muscle based on any symptoms.  We discussed advanced imaging with patient would like to hold.  We will continue to increase activity and work with the exercises follow-up again 6 weeks

## 2021-06-01 NOTE — Patient Instructions (Signed)
Good to see you See me again in  

## 2021-06-01 NOTE — Assessment & Plan Note (Signed)
Injection 5 months ago.  We will hold until next time.  Follow-up again in 6 weeks

## 2021-06-07 ENCOUNTER — Other Ambulatory Visit: Payer: Self-pay

## 2021-06-07 DIAGNOSIS — M25552 Pain in left hip: Secondary | ICD-10-CM

## 2021-06-07 MED ORDER — TRAZODONE HCL 50 MG PO TABS: 50.0000 mg | ORAL_TABLET | Freq: Every evening | ORAL | 0 refills | Status: DC | PRN

## 2021-06-07 NOTE — Progress Notes (Unsigned)
MRI p

## 2021-06-11 ENCOUNTER — Other Ambulatory Visit: Payer: Self-pay | Admitting: Family Medicine

## 2021-06-11 ENCOUNTER — Other Ambulatory Visit: Payer: Self-pay | Admitting: Cardiology

## 2021-06-14 ENCOUNTER — Other Ambulatory Visit: Payer: Self-pay | Admitting: Cardiology

## 2021-06-14 ENCOUNTER — Other Ambulatory Visit: Payer: Self-pay | Admitting: Family Medicine

## 2021-06-14 DIAGNOSIS — M109 Gout, unspecified: Secondary | ICD-10-CM

## 2021-06-14 DIAGNOSIS — R931 Abnormal findings on diagnostic imaging of heart and coronary circulation: Secondary | ICD-10-CM

## 2021-06-14 MED ORDER — DIAZEPAM 5 MG PO TABS: ORAL_TABLET | ORAL | 0 refills | Status: DC

## 2021-06-15 NOTE — Telephone Encounter (Signed)
[facility] is requesting to fill pt allopurinol for a year. Please advise [facility]

## 2021-06-17 ENCOUNTER — Other Ambulatory Visit: Payer: Self-pay

## 2021-06-17 DIAGNOSIS — M109 Gout, unspecified: Secondary | ICD-10-CM

## 2021-06-21 ENCOUNTER — Ambulatory Visit
Admission: RE | Admit: 2021-06-21 | Discharge: 2021-06-21 | Disposition: A | Discharge status: Home / Self Care | Payer: 59 | Source: Ambulatory Visit | Attending: Family Medicine | Admitting: Family Medicine

## 2021-06-21 ENCOUNTER — Other Ambulatory Visit: Payer: Self-pay

## 2021-06-21 DIAGNOSIS — M25552 Pain in left hip: Secondary | ICD-10-CM

## 2021-06-21 MED ORDER — IOPAMIDOL (ISOVUE-M 200) INJECTION 41%
15.0000 mL | Freq: Once | INTRAMUSCULAR | Status: AC
  Administered 2021-06-21: 15 mL via INTRA_ARTICULAR

## 2021-06-22 ENCOUNTER — Ambulatory Visit (INDEPENDENT_AMBULATORY_CARE_PROVIDER_SITE_OTHER): Payer: 59 | Admitting: Family Medicine

## 2021-06-22 VITALS — BP 140/88 | HR 73 | Ht 72.0 in | Wt 195.0 lb

## 2021-06-22 DIAGNOSIS — S72145A Nondisplaced intertrochanteric fracture of left femur, initial encounter for closed fracture: Secondary | ICD-10-CM | POA: Diagnosis not present

## 2021-06-22 DIAGNOSIS — R1032 Left lower quadrant pain: Secondary | ICD-10-CM | POA: Diagnosis not present

## 2021-06-22 NOTE — Progress Notes (Signed)
[name] Phone: [phone] Subjective:   [name], am serving as a scribe for Dr. Hulan Saas. This visit occurred during the SARS-CoV-2 public health emergency.  Safety protocols were in place, including screening questions prior to the visit, additional usage of staff PPE, and extensive cleaning of exam room while observing appropriate contact time as indicated for disinfecting solutions.   I'm seeing this patient by the request  of:  [name], MD  CC: Left hip pain follow-up  [id number]  Matthew [name] is a 65 y.o. male coming in with complaint of L hip and groin pain. Patient states that he has had no change in pain since last visit.  Patient has been activity difficult.  Is able to consider but unfortunately has significant pain.  Seems to be having more pain at night.  Patient is accompanied with wife who noticed that he is having more difficulty with even walking.  MRI L hip 06/21/2021 IMPRESSION: 1. Subchondral fracture of the left femoral head with marked bone marrow edema throughout the left femoral head and neck extending into the intertrochanteric region. 2. Small-moderate bilateral hip joint effusions with prominent capsular thickening/or focal synovitis involving the anterolateral aspect of the left hip joint. 3. Moderate osteoarthritis of the bilateral hips. 4. Intramuscular edema within the proximal left adductor compartment likely reflecting muscle strain.    MRI Pelvis 06/21/2021 IMPRESSION: 1. Subchondral fracture of the left femoral head with marked bone marrow edema throughout the left femoral head and neck extending into the intertrochanteric region. 2. Small-moderate bilateral hip joint effusions with prominent capsular thickening/or focal synovitis involving the anterolateral aspect of the left hip joint. 3. Moderate osteoarthritis of the bilateral hips. 4.  Intramuscular edema within the proximal left adductor compartment likely reflecting muscle strain.    Past Medical History:  Diagnosis Date   Allergy    RHINITIS   Anemia    Arthritis    Bursitis of left foot 11/11/2016   Colonic polyp 09/2006   colonoscopy   Ganglion 11/02/2017   GERD (gastroesophageal reflux disease)    Golfer's elbow, right 09/25/2019   Gout    Heart beat abnormality 12/14/2017   Heart murmur    Herpes genitalis in men 02/14/2014   Hypertension    Osteitis pubis ([place]) 01/27/2016   Primary osteoarthritis of left knee 11/06/2017   Right groin pain 11/08/2018   SOB (shortness of breath)    per pt, may be coming from new medication   Subluxation of distal radial-ulnar joint 12/03/2019   Subluxation of tendon of long head of biceps 10/15/2016   Injected shoulder 12/19/2016   Subungual hematoma of foot, initial encounter 05/29/2018   SVT (supraventricular tachycardia) ([place]) 03/09/2020   Past Surgical History:  Procedure Laterality Date   COLONOSCOPY  09/2006   KNEE ARTHROSCOPY Left 2011   KNEE SURGERY Left 08/2018   scar tissue surgery   LUMBAR DISC SURGERY  1990   SVT ABLATION N/A 01/09/2020   Procedure: SVT ABLATION;  Surgeon: Constance Haw, MD;  Location: [facility];  Service: Cardiovascular;  Laterality: N/A;   TOTAL KNEE ARTHROPLASTY Left 11/06/2017   Procedure: TOTAL KNEE ARTHROPLASTY;  Surgeon: Dorna Leitz, MD;  Location: [facility];  Service: Orthopedics;  Laterality: Left;   Social History   Socioeconomic History   Marital status: Married    Spouse name: Not on file   Number of children: 1  Years of education: Not on file   Highest education level: Not on file  Occupational History   Not on file  Tobacco Use   Smoking status: Some Days    Types: Cigars   Smokeless tobacco: Never   Tobacco comments:    occasional cigar  Vaping Use   Vaping Use: Never used  Substance and Sexual Activity   Alcohol use: Yes    Alcohol/week: 3.0 standard  drinks    Types: 3 Standard drinks or equivalent per week    Comment: occ 2-3 beers   Drug use: No   Sexual activity: Yes  Other Topics Concern   Not on file  Social History Narrative   Not on file   Social Determinants of Health   Financial Resource Strain: Not on file  Food Insecurity: Not on file  Transportation Needs: Not on file  Physical Activity: Not on file  Stress: Not on file  Social Connections: Not on file   Allergies  Allergen Reactions   Beta Adrenergic Blockers Other (See Comments)    Fatigue   Lisinopril Cough   Asa [Aspirin] Rash    High dosage ASA causes break out   Demerol Itching   Family History  Problem Relation Age of Onset   Stroke Father    Gout Father    Depression Brother    Dermatomyositis Daughter    Colon cancer Neg Hx    Rectal cancer Neg Hx    Esophageal cancer Neg Hx    Pancreatic cancer Neg Hx    Stomach cancer Neg Hx    Liver disease Neg Hx     Current Outpatient Medications (Endocrine & Metabolic):    predniSONE (DELTASONE) 20 MG tablet, Take 1 tablet (20 mg total) by mouth daily with breakfast.  Current Outpatient Medications (Cardiovascular):    diltiazem (CARDIZEM CD) 360 MG 24 hr capsule, TAKE 1 CAPSULE BY MOUTH EVERY DAY   losartan-hydrochlorothiazide (HYZAAR) 50-12.5 MG tablet, TAKE 1 TABLET BY MOUTH EVERY DAY   rosuvastatin (CRESTOR) 20 MG tablet, TAKE 1 TABLET BY MOUTH EVERY DAY  Current Outpatient Medications (Respiratory):    fexofenadine (ALLEGRA) 180 MG tablet, Take 180 mg by mouth at bedtime.    fluticasone (FLONASE) 50 MCG/ACT nasal spray, Place into the nose.  Current Outpatient Medications (Analgesics):    allopurinol (ZYLOPRIM) 300 MG tablet, Take 1 tablet (300 mg total) by mouth daily.   aspirin EC 81 MG tablet, Take 81 mg by mouth daily.   meloxicam (MOBIC) 15 MG tablet, TAKE 1 TABLET BY MOUTH EVERY DAY  Current Outpatient Medications (Hematological):    Ferrous Sulfate (IRON) 325 (65 FE) MG TABS, Take  325 mg by mouth daily.   Current Outpatient Medications (Other):    AMBULATORY NON FORMULARY MEDICATION, GI cocktail: 90 ml vicous lidocaine , 90 ml '10mg'$ [id number] dicyclomine , 270 ml maalox 5-10 ml every 4 hours before breakfast   amoxicillin-clavulanate (AUGMENTIN) 875-125 MG tablet, Take 1 tablet by mouth 2 (two) times daily.   cholecalciferol (VITAMIN D) 1000 units tablet, Take 1,000 Units by mouth daily.   diazepam (VALIUM) 5 MG tablet, One tab by mouth, 2 hours before procedure.   dicyclomine (BENTYL) 10 MG/5ML solution,    esomeprazole (NEXIUM) 40 MG capsule, TAKE 1 CAPSULE (40 MG TOTAL) BY MOUTH 2 (TWO) TIMES DAILY BEFORE A MEAL. TAKE ONE TABLET 30-60 MINUTES BEFORE BREAKFAST   famotidine (PEPCID) 40 MG tablet, Take 1 tablet (40 mg total) by mouth 2 (two) times daily. Take 1  tablet every morning and every night   fish oil-omega-3 fatty acids 1000 MG capsule, Take 3,600 g by mouth daily.   gabapentin (NEURONTIN) 100 MG capsule, TAKE 2 CAPSULES BY MOUTH AT BEDTIME.   gabapentin (NEURONTIN) 100 MG capsule, Take 2 capsules (200 mg total) by mouth at bedtime.   Misc Natural Products (TART CHERRY ADVANCED PO), Take 3,600 mg by mouth daily.   Multiple Vitamin (MULTIVITAMIN) tablet, Take 1 tablet by mouth daily.   sucralfate (CARAFATE) 1 g tablet, Take 1 tablet (1 g total) by mouth 4 (four) times daily. Take one tablet 30-60 minutes before breakfast.   traZODone (DESYREL) 50 MG tablet, Take 1 tablet (50 mg total) by mouth at bedtime as needed for sleep.   triamcinolone (KENALOG) 0.1 % paste, SMARTSIG:1 TO TEETH Every Night   valACYclovir (VALTREX) 500 MG tablet, Take 500 mg by mouth daily.   Review of Systems:  No headache, visual changes, nausea, vomiting, diarrhea, constipation, dizziness, abdominal pain, skin rash, fevers, chills, night sweats, weight loss, swollen lymph nodes, body aches, joint swelling, chest pain, shortness of breath, mood changes. POSITIVE muscle aches  Objective  Blood  pressure 140/88, pulse 73, height 6' (1.829 m), weight 195 lb (88.5 kg), SpO2 97 %.   General: No apparent distress alert and oriented x3 mood and affect normal, dressed appropriately.  HEENT: Pupils equal, extraocular movements intact  Respiratory: Patient's speak in full sentences and does not appear short of breath  Cardiovascular: No lower extremity edema, non tender, no erythema  Gait severely antalgic Patient's left hip is externally rotated with locking.  Difficulty with internal rotation of the knee.   Impression and Recommendations:     The above documentation has been reviewed and is accurate and complete Lyndal Pulley, DO

## 2021-06-22 NOTE — Assessment & Plan Note (Signed)
Patient was found to have an hip fracture noted.  Seems to be the femoral head, and neck pain does extend into the intertrochanteric area.  Seems to be worsening in over the course of time with some synovitis also noted on MRI.  Patient does have moderate arthritic changes.  Discussed with patient at great length as well as with his wife.  Due to the severity of pain, the underlying arthritic changes as well as the patient's activity level I am concerned that this will not do well with any type of conservative therapy and possible surgical intervention is necessary.  Did discuss with him know that I would like him to discuss with the surgeon about the best possibility for treatment.  Patient will be referred accordingly at this time and will follow up with me again as needed for any other questions or other orthopedic problems.

## 2021-06-24 ENCOUNTER — Other Ambulatory Visit: Payer: Self-pay | Admitting: Orthopaedic Surgery

## 2021-06-24 DIAGNOSIS — Z01818 Encounter for other preprocedural examination: Secondary | ICD-10-CM

## 2021-06-25 ENCOUNTER — Ambulatory Visit (INDEPENDENT_AMBULATORY_CARE_PROVIDER_SITE_OTHER)
Admission: RE | Admit: 2021-06-25 | Discharge: 2021-06-25 | Disposition: A | Discharge status: Home / Self Care | Payer: 59 | Source: Ambulatory Visit | Attending: Family Medicine | Admitting: Family Medicine

## 2021-06-25 ENCOUNTER — Other Ambulatory Visit: Payer: Self-pay

## 2021-06-25 DIAGNOSIS — S72145A Nondisplaced intertrochanteric fracture of left femur, initial encounter for closed fracture: Secondary | ICD-10-CM

## 2021-06-26 ENCOUNTER — Other Ambulatory Visit: Payer: Self-pay | Admitting: Family Medicine

## 2021-06-26 DIAGNOSIS — M109 Gout, unspecified: Secondary | ICD-10-CM

## 2021-06-28 ENCOUNTER — Encounter: Payer: Self-pay | Admitting: Family Medicine

## 2021-06-28 ENCOUNTER — Other Ambulatory Visit: Payer: Self-pay | Admitting: *Deleted

## 2021-06-28 DIAGNOSIS — M109 Gout, unspecified: Secondary | ICD-10-CM

## 2021-06-28 MED ORDER — ALLOPURINOL 300 MG PO TABS: 300.0000 mg | ORAL_TABLET | Freq: Every day | ORAL | 0 refills | Status: DC

## 2021-06-28 NOTE — Progress Notes (Signed)
Sent message, via epic in basket, requesting orders in epic from surgeon.  

## 2021-07-02 MED ORDER — GABAPENTIN 100 MG PO CAPS: 400.0000 mg | ORAL_CAPSULE | Freq: Every day | ORAL | 3 refills | Status: DC

## 2021-07-05 ENCOUNTER — Encounter: Payer: Self-pay | Admitting: Internal Medicine

## 2021-07-05 DIAGNOSIS — Z01818 Encounter for other preprocedural examination: Secondary | ICD-10-CM | POA: Insufficient documentation

## 2021-07-05 NOTE — Progress Notes (Signed)
Subjective:    Patient ID: Matthew [name], male    DOB: September 04, 1956, 65 y.o.   MRN: [id number]  HPI He is here for pre-operative clearance at the request of Dr Rhona Raider for left total hip arthroplasty scheduled for 07/20/21.   He denies any personal or family history of problems with anesthesia or bleeding/blood clot problems.    He has no concerns.  He is taking all his medication as prescribed.   He has been cleared by surgery and instructions have already been given for stopping his Eliquis.  He is exercising regularly.  With his daily activities and exercise he denies chest pain, palpitations, SOB and lightheadedness.       Medications and allergies reviewed with patient and updated if appropriate.  Patient Active Problem List   Diagnosis Date Noted   Pre-op evaluation 07/05/2021   Severe left groin pain 05/18/2021   Lateral epicondylitis, left elbow 04/15/2021   Left shoulder pain 02/24/2021   Pharyngeal dysphagia, mild 01/17/2021   Left-sided nosebleed 12/21/2020   Acquired trigger finger of right middle finger 11/12/2020   Sinusitis, chronic 08/18/2020   History of atrial fibrillation 05/25/2020   SVT (supraventricular tachycardia) ([place]) 03/09/2020   Subluxation of distal radial-ulnar joint 12/03/2019   Golfer's elbow, right 09/25/2019   Former smoker 03/07/2019   Right groin pain 11/08/2018   Chronic knee pain after total replacement of left knee joint 06/04/2018   Subungual hematoma of foot, initial encounter 05/29/2018   Pain and swelling of left knee 05/29/2018   Heart beat abnormality 12/14/2017   [name] 11/15/2017   Primary osteoarthritis of left knee 11/06/2017   Lipoma of torso 11/02/2017   Ganglion 11/02/2017   Sleep deficient 03/22/2017   Bursitis of left foot 11/11/2016   Subluxation of tendon of long head of biceps 10/15/2016   Leg cramps, sleep related 10/15/2016   Hamstring tendinitis of left thigh 07/26/2016   Gout 07/26/2016    Osteitis pubis ([place]) 01/27/2016   Neck tightness 08/12/2015   Nonallopathic lesion of cervical region 08/12/2015   Nonallopathic lesion of thoracic region 08/12/2015   Nonallopathic lesion-rib cage 08/12/2015   ACE-inhibitor cough 08/12/2015   GERD (gastroesophageal reflux disease) 08/01/2014   Neck pain 03/03/2014   Hypertension 02/14/2014   Allergic rhinitis 02/14/2014   Herpes genitalis in men 02/14/2014   Personal history of colonic polyps 05/06/2011   Pes planus 04/30/2008    Current Outpatient Medications on File Prior to Visit  Medication Sig Dispense Refill   allopurinol (ZYLOPRIM) 300 MG tablet Take 1 tablet (300 mg total) by mouth daily. 90 tablet 0   aspirin EC 81 MG tablet Take 81 mg by mouth daily.     cholecalciferol (VITAMIN D) 1000 units tablet Take 1,000 Units by mouth daily.     dicyclomine (BENTYL) 10 MG/5ML solution      diltiazem (CARDIZEM CD) 360 MG 24 hr capsule TAKE 1 CAPSULE BY MOUTH EVERY DAY 90 capsule 3   esomeprazole (NEXIUM) 40 MG capsule TAKE 1 CAPSULE (40 MG TOTAL) BY MOUTH 2 (TWO) TIMES DAILY BEFORE A MEAL. TAKE ONE TABLET 30-60 MINUTES BEFORE BREAKFAST 60 capsule 3   famotidine (PEPCID) 40 MG tablet Take 1 tablet (40 mg total) by mouth 2 (two) times daily. Take 1 tablet every morning and every night 60 tablet 3   Ferrous Sulfate (IRON) 325 (65 FE) MG TABS Take 325 mg by mouth daily.      fexofenadine (ALLEGRA) 180 MG tablet Take  180 mg by mouth at bedtime.      fish oil-omega-3 fatty acids 1000 MG capsule Take 3,600 g by mouth daily.     fluticasone (FLONASE) 50 MCG/ACT nasal spray Place into the nose.     gabapentin (NEURONTIN) 100 MG capsule Take 4 capsules (400 mg total) by mouth at bedtime. 360 capsule 3   losartan-hydrochlorothiazide (HYZAAR) 50-12.5 MG tablet TAKE 1 TABLET BY MOUTH EVERY DAY 90 tablet 0   meloxicam (MOBIC) 15 MG tablet TAKE 1 TABLET BY MOUTH EVERY DAY 30 tablet 2   Misc Natural Products (TART CHERRY ADVANCED PO) Take 3,600 mg by  mouth daily.     Multiple Vitamin (MULTIVITAMIN) tablet Take 1 tablet by mouth daily.     rosuvastatin (CRESTOR) 20 MG tablet TAKE 1 TABLET BY MOUTH EVERY DAY 90 tablet 0   sucralfate (CARAFATE) 1 g tablet Take 1 tablet (1 g total) by mouth 4 (four) times daily. Take one tablet 30-60 minutes before breakfast. 120 tablet 3   triamcinolone (KENALOG) 0.1 % paste SMARTSIG:1 TO TEETH Every Night     valACYclovir (VALTREX) 500 MG tablet Take 500 mg by mouth daily.     No current facility-administered medications on file prior to visit.    Past Medical History:  Diagnosis Date   Allergy    RHINITIS   Anemia    Arthritis    Bursitis of left foot 11/11/2016   Colonic polyp 09/2006   colonoscopy   Ganglion 11/02/2017   GERD (gastroesophageal reflux disease)    Golfer's elbow, right 09/25/2019   Gout    Heart beat abnormality 12/14/2017   Heart murmur    Herpes genitalis in men 02/14/2014   Hypertension    Osteitis pubis ([place]) 01/27/2016   Primary osteoarthritis of left knee 11/06/2017   Right groin pain 11/08/2018   SOB (shortness of breath)    per pt, may be coming from new medication   Subluxation of distal radial-ulnar joint 12/03/2019   Subluxation of tendon of long head of biceps 10/15/2016   Injected shoulder 12/19/2016   Subungual hematoma of foot, initial encounter 05/29/2018   SVT (supraventricular tachycardia) ([place]) 03/09/2020    Past Surgical History:  Procedure Laterality Date   COLONOSCOPY  09/2006   KNEE ARTHROSCOPY Left 2011   KNEE SURGERY Left 08/2018   scar tissue surgery   LUMBAR DISC SURGERY  1990   SVT ABLATION N/A 01/09/2020   Procedure: SVT ABLATION;  Surgeon: Constance Haw, MD;  Location: [facility];  Service: Cardiovascular;  Laterality: N/A;   TOTAL KNEE ARTHROPLASTY Left 11/06/2017   Procedure: TOTAL KNEE ARTHROPLASTY;  Surgeon: Dorna Leitz, MD;  Location: [facility];  Service: Orthopedics;  Laterality: Left;    Social History   Socioeconomic History    Marital status: Married    Spouse name: Not on file   Number of children: 1   Years of education: Not on file   Highest education level: Not on file  Occupational History   Not on file  Tobacco Use   Smoking status: Some Days    Types: Cigars   Smokeless tobacco: Never   Tobacco comments:    occasional cigar  Vaping Use   Vaping Use: Never used  Substance and Sexual Activity   Alcohol use: Yes    Alcohol/week: 3.0 standard drinks    Types: 3 Standard drinks or equivalent per week    Comment: occ 2-3 beers   Drug use: No   Sexual  activity: Yes  Other Topics Concern   Not on file  Social History Narrative   Not on file   Social Determinants of Health   Financial Resource Strain: Not on file  Food Insecurity: Not on file  Transportation Needs: Not on file  Physical Activity: Not on file  Stress: Not on file  Social Connections: Not on file    Family History  Problem Relation Age of Onset   Stroke Father    Gout Father    Depression Brother    Dermatomyositis Daughter    Colon cancer Neg Hx    Rectal cancer Neg Hx    Esophageal cancer Neg Hx    Pancreatic cancer Neg Hx    Stomach cancer Neg Hx    Liver disease Neg Hx     Review of Systems  Constitutional:  Negative for chills and fever.  Eyes:  Negative for visual disturbance.  Respiratory:  Negative for cough, shortness of breath and wheezing.   Cardiovascular:  Negative for chest pain, palpitations and leg swelling.  Gastrointestinal:  Negative for abdominal pain, blood in stool (No black stool), constipation, diarrhea and nausea.       GERD controlled-occasional symptoms only  Genitourinary:  Negative for dysuria and hematuria.  Musculoskeletal:  Positive for arthralgias.  Skin:  Negative for rash.  Neurological:  Negative for dizziness, light-headedness and headaches.  Psychiatric/Behavioral:  Negative for dysphoric mood. The patient is not nervous/anxious.       Objective:   Vitals:   07/06/21 1527   BP: 128/80  Pulse: 73  Temp: 98.1 F (36.7 C)  SpO2: 97%   BP Readings from Last 3 Encounters:  07/06/21 128/80  06/22/21 140/88  06/01/21 132/82   Wt Readings from Last 3 Encounters:  07/06/21 191 lb (86.6 kg)  06/22/21 195 lb (88.5 kg)  06/01/21 196 lb (88.9 kg)   Body mass index is 25.9 kg/m.   Depression screen [facility] 2/9 07/06/2021 03/09/2020 03/07/2019 11/02/2017 07/28/2015  Decreased Interest 0 0 0 0 0  Down, Depressed, Hopeless 0 0 0 0 0  PHQ - 2 Score 0 0 0 0 0  Altered sleeping 0 - - - -  Tired, decreased energy 0 - - - -  Change in appetite 0 - - - -  Feeling bad or failure about yourself  0 - - - -  Trouble concentrating 0 - - - -  Moving slowly or fidgety/restless 0 - - - -  Suicidal thoughts 0 - - - -  PHQ-9 Score 0 - - - -  Some recent data might be hidden    GAD 7 : Generalized Anxiety Score 07/06/2021  Nervous, Anxious, on Edge 0  Control/stop worrying 0  Worry too much - different things 0  Trouble relaxing 0  Restless 0  Easily annoyed or irritable 0  Afraid - awful might happen 0  Total GAD 7 Score 0      Physical Exam    Constitutional: Appears well-developed and well-nourished. No distress.  HENT:  Head: Normocephalic and atraumatic.  Neck: Neck supple. No tracheal deviation present. No thyromegaly present.  No cervical lymphadenopathy Cardiovascular: Normal rate, regular rhythm and normal heart sounds.   No murmur heard. No carotid bruit .  No edema Pulmonary/Chest: Effort normal and breath sounds normal. No respiratory distress. No has no wheezes. No rales.  Abdomen; soft, NT, ND Skin: Skin is warm and dry. Not diaphoretic.  Psychiatric: Normal mood and affect. Behavior is normal.  Lab Results  Component Value Date   WBC 5.1 01/09/2020   HGB 13.0 01/09/2020   HCT 38.8 (L) 01/09/2020   PLT 255 01/09/2020   GLUCOSE 96 01/09/2020   CHOL 132 03/09/2020   TRIG 71 03/09/2020   HDL 68 03/09/2020   LDLCALC 50 03/09/2020   ALT 23 07/10/2019    AST 31 07/10/2019   NA 140 01/09/2020   K 4.1 01/09/2020   CL 103 01/09/2020   CREATININE 0.81 01/09/2020   BUN 23 01/09/2020   CO2 24 01/09/2020   TSH 1.62 12/14/2017   PSA 0.7 11/02/2017   INR 1.1 (H) 11/15/2017      Assessment & Plan:    Screened for depression using the PHQ 9 scale.  No evidence of depression.   Screened for anxiety using GAD7 Scale.  No evidence of anxiety.   See Problem List for Assessment and Plan of chronic medical problems.    This visit occurred during the SARS-CoV-2 public health emergency.  Safety protocols were in place, including screening questions prior to the visit, additional usage of staff PPE, and extensive cleaning of exam room while observing appropriate contact time as indicated for disinfecting solutions.

## 2021-07-06 ENCOUNTER — Ambulatory Visit (INDEPENDENT_AMBULATORY_CARE_PROVIDER_SITE_OTHER): Payer: 59 | Admitting: Internal Medicine

## 2021-07-06 ENCOUNTER — Other Ambulatory Visit: Payer: Self-pay

## 2021-07-06 VITALS — BP 128/80 | HR 73 | Temp 98.1°F | Ht 72.0 in | Wt 191.0 lb

## 2021-07-06 DIAGNOSIS — E611 Iron deficiency: Secondary | ICD-10-CM | POA: Diagnosis not present

## 2021-07-06 DIAGNOSIS — R739 Hyperglycemia, unspecified: Secondary | ICD-10-CM

## 2021-07-06 DIAGNOSIS — I471 Supraventricular tachycardia: Secondary | ICD-10-CM

## 2021-07-06 DIAGNOSIS — I10 Essential (primary) hypertension: Secondary | ICD-10-CM | POA: Diagnosis not present

## 2021-07-06 DIAGNOSIS — Z01818 Encounter for other preprocedural examination: Secondary | ICD-10-CM

## 2021-07-06 DIAGNOSIS — Z1331 Encounter for screening for depression: Secondary | ICD-10-CM

## 2021-07-06 LAB — HEMOGLOBIN A1C: Hgb A1c MFr Bld: 5.8 % (ref 4.6–6.5)

## 2021-07-06 LAB — HEPATIC FUNCTION PANEL
ALT: 23 U/L (ref 0–53)
AST: 23 U/L (ref 0–37)
Albumin: 5 g/dL (ref 3.5–5.2)
Alkaline Phosphatase: 60 U/L (ref 39–117)
Bilirubin, Direct: 0.1 mg/dL (ref 0.0–0.3)
Total Bilirubin: 0.6 mg/dL (ref 0.2–1.2)
Total Protein: 7.8 g/dL (ref 6.0–8.3)

## 2021-07-06 LAB — LIPID PANEL
Cholesterol: 156 mg/dL (ref 0–200)
HDL: 75.4 mg/dL (ref >39.00)
LDL Cholesterol: 42 mg/dL (ref 0–99)
NonHDL: 80.35
Total CHOL/HDL Ratio: 2
Triglycerides: 192 mg/dL — ABNORMAL HIGH (ref 0.0–149.0)
VLDL: 38.4 mg/dL (ref 0.0–40.0)

## 2021-07-06 LAB — PSA, MEDICARE: PSA: 1.36 ng/ml (ref 0.10–4.00)

## 2021-07-06 LAB — TSH: TSH: 1.86 u[IU]/mL (ref 0.35–5.50)

## 2021-07-06 NOTE — Patient Instructions (Addendum)
  Blood work was ordered.     Medications changes include :   none    We will fax your pre-op clearance form.

## 2021-07-06 NOTE — Patient Instructions (Signed)
DUE TO COVID-19 ONLY ONE VISITOR IS ALLOWED TO COME WITH YOU AND STAY IN THE WAITING ROOM ONLY DURING PRE OP AND PROCEDURE.   **NO VISITORS ARE ALLOWED IN THE SHORT STAY AREA OR RECOVERY ROOM!!**  IF YOU WILL BE ADMITTED INTO THE HOSPITAL YOU ARE ALLOWED ONLY TWO SUPPORT PEOPLE DURING VISITATION HOURS ONLY (10AM -8PM)   The support person(s) may change daily. The support person(s) must pass our screening, gel in and out, and wear a mask at all times, including in the patient's room. Patients must also wear a mask when staff or their support person are in the room.  No visitors under the age of 65. Any visitor under the age of 48 must be accompanied by an adult.        Your procedure is scheduled on: 07/20/21   Report to [facility] Main  Entrance   Report to Short Stay at 5:15 AM   [facility])   Call this number if you have problems the morning of surgery [phone]   Do not eat food :After Midnight.   May have liquids until : 4:30 AM.   day of surgery  CLEAR LIQUID DIET  Foods Allowed                                                                     Foods Excluded  Water, Black Coffee and tea, regular and decaf                             liquids that you cannot  Plain Jell-O in any flavor  (No red)                                           see through such as: Fruit ices (not with fruit pulp)                                     milk, soups, orange juice              Iced Popsicles (No red)                                    All solid food                                   Apple juices Sports drinks like Gatorade (No red) Lightly seasoned clear broth or consume(fat free) Sugar  Sample Menu Breakfast                                Lunch                                     Supper Cranberry juice  Beef broth                            Chicken broth Jell-O                                     Grape juice                           Apple juice Coffee  or tea                        Jell-O                                      Popsicle                                                Coffee or tea                        Coffee or tea      Complete one Ensure drink the morning of surgery at : 4:30 AM.      the day of surgery.     The day of surgery:  Drink ONE (1) Pre-Surgery Clear Ensure or G2 by am the morning of surgery. Drink in one sitting. Do not sip.  This drink was given to you during your hospital  pre-op appointment visit. Nothing else to drink after completing the  Pre-Surgery Clear Ensure or G2.          If you have questions, please contact your surgeon's office.     Oral Hygiene is also important to reduce your risk of infection.                                    Remember - BRUSH YOUR TEETH THE MORNING OF SURGERY WITH YOUR REGULAR TOOTHPASTE   Do NOT smoke after Midnight   Take these medicines the morning of surgery with A SIP OF WATER: gabapentin,esomeprazole,famotidine,diltiazem. Flonase as usual.  DO NOT TAKE ANY ORAL DIABETIC MEDICATIONS DAY OF YOUR SURGERY                              You may not have any metal on your body including hair pins, jewelry, and body piercing             Do not wear lotions, powders, perfumes/cologne, or deodorant  Do not wear nail polish including gel and S&S, artificial/acrylic nails, or any other type of covering on natural nails including finger and toenails. If you have artificial nails, gel coating, etc. that needs to be removed by a nail salon please have this removed prior to surgery or surgery may need to be canceled/ delayed if the surgeon/ anesthesia feels like they are unable to be safely monitored.               Men may shave face and neck.   Do not  bring valuables to the hospital. [place].   Contacts, dentures or bridgework may not be worn into surgery.   Bring small overnight bag day of surgery.    Patients  discharged the day of surgery will not be allowed to drive home.   Special Instructions: Bring a copy of your healthcare power of attorney and living will documents         the day of surgery if you haven't scanned them in before.              Please read over the following fact sheets you were given: IF YOU HAVE QUESTIONS ABOUT YOUR PRE OP INSTRUCTIONS PLEASE CALL [phone]   [facility] - Preparing for Surgery Before surgery, you can play an important role.  Because skin is not sterile, your skin needs to be as free of germs as possible.  You can reduce the number of germs on your skin by washing with CHG (chlorahexidine gluconate) soap before surgery.  CHG is an antiseptic cleaner which kills germs and bonds with the skin to continue killing germs even after washing. Please DO NOT use if you have an allergy to CHG or antibacterial soaps.  If your skin becomes reddened/irritated stop using the CHG and inform your nurse when you arrive at Short Stay. Do not shave (including legs and underarms) for at least 48 hours prior to the first CHG shower.  You may shave your face/neck. Please follow these instructions carefully:  1.  Shower with CHG Soap the night before surgery and the  morning of Surgery.  2.  If you choose to wash your hair, wash your hair first as usual with your  normal  shampoo.  3.  After you shampoo, rinse your hair and body thoroughly to remove the  shampoo.                           4.  Use CHG as you would any other liquid soap.  You can apply chg directly  to the skin and wash                       Gently with a scrungie or clean washcloth.  5.  Apply the CHG Soap to your body ONLY FROM THE NECK DOWN.   Do not use on face/ open                           Wound or open sores. Avoid contact with eyes, ears mouth and genitals (private parts).                       Wash face,  Genitals (private parts) with your normal soap.             6.  Wash thoroughly, paying special attention  to the area where your surgery  will be performed.  7.  Thoroughly rinse your body with warm water from the neck down.  8.  DO NOT shower/wash with your normal soap after using and rinsing off  the CHG Soap.                9.  Pat yourself dry with a clean towel.            10.  Wear clean pajamas.            11.  Place clean sheets on your bed the night of your first shower and do not  sleep with pets. Day of Surgery : Do not apply any lotions/deodorants the morning of surgery.  Please wear clean clothes to the hospital/surgery center.  FAILURE TO FOLLOW THESE INSTRUCTIONS MAY RESULT IN THE CANCELLATION OF YOUR SURGERY PATIENT SIGNATURE_________________________________  NURSE SIGNATURE__________________________________  ________________________________________________________________________   Adam Phenix  An incentive spirometer is a tool that can help keep your lungs clear and active. This tool measures how well you are filling your lungs with each breath. Taking long deep breaths may help reverse or decrease the chance of developing breathing (pulmonary) problems (especially infection) following: A long period of time when you are unable to move or be active. BEFORE THE PROCEDURE  If the spirometer includes an indicator to show your best effort, your nurse or respiratory therapist will set it to a desired goal. If possible, sit up straight or lean slightly forward. Try not to slouch. Hold the incentive spirometer in an upright position. INSTRUCTIONS FOR USE  Sit on the edge of your bed if possible, or sit up as far as you can in bed or on a chair. Hold the incentive spirometer in an upright position. Breathe out normally. Place the mouthpiece in your mouth and seal your lips tightly around it. Breathe in slowly and as deeply as possible, raising the piston or the ball toward the top of the column. Hold your breath for 3-5 seconds or for as long as possible. Allow the piston  or ball to fall to the bottom of the column. Remove the mouthpiece from your mouth and breathe out normally. Rest for a few seconds and repeat Steps 1 through 7 at least 10 times every 1-2 hours when you are awake. Take your time and take a few normal breaths between deep breaths. The spirometer may include an indicator to show your best effort. Use the indicator as a goal to work toward during each repetition. After each set of 10 deep breaths, practice coughing to be sure your lungs are clear. If you have an incision (the cut made at the time of surgery), support your incision when coughing by placing a pillow or rolled up towels firmly against it. Once you are able to get out of bed, walk around indoors and cough well. You may stop using the incentive spirometer when instructed by your caregiver.  RISKS AND COMPLICATIONS Take your time so you do not get dizzy or light-headed. If you are in pain, you may need to take or ask for pain medication before doing incentive spirometry. It is harder to take a deep breath if you are having pain. AFTER USE Rest and breathe slowly and easily. It can be helpful to keep track of a log of your progress. Your caregiver can provide you with a simple table to help with this. If you are using the spirometer at home, follow these instructions: [place] IF:  You are having difficultly using the spirometer. You have trouble using the spirometer as often as instructed. Your pain medication is not giving enough relief while using the spirometer. You develop fever of 100.5 F (38.1 C) or higher. SEEK IMMEDIATE MEDICAL CARE IF:  You cough up bloody sputum that had not been present before. You develop fever of 102 F (38.9 C) or greater. You develop worsening pain at or near the incision  site. MAKE SURE YOU:  Understand these instructions. Will watch your condition. Will get help right away if you are not doing well or get worse. Document Released:  02/27/2007 Document Revised: 01/09/2012 Document Reviewed: 04/30/2007 [facility] Patient Information 2014 [place], [place].   ________________________________________________________________________

## 2021-07-07 ENCOUNTER — Other Ambulatory Visit: Payer: Self-pay

## 2021-07-07 ENCOUNTER — Encounter: Payer: Self-pay | Admitting: Family Medicine

## 2021-07-07 ENCOUNTER — Encounter: Payer: Self-pay | Admitting: Internal Medicine

## 2021-07-07 ENCOUNTER — Encounter (HOSPITAL_COMMUNITY): Payer: Self-pay

## 2021-07-07 ENCOUNTER — Encounter (HOSPITAL_COMMUNITY)
Admission: RE | Admit: 2021-07-07 | Discharge: 2021-07-07 | Disposition: A | Discharge status: Home / Self Care | Payer: 59 | Source: Ambulatory Visit | Attending: Orthopaedic Surgery | Admitting: Orthopaedic Surgery

## 2021-07-07 ENCOUNTER — Ambulatory Visit (HOSPITAL_COMMUNITY)
Admission: RE | Admit: 2021-07-07 | Discharge: 2021-07-07 | Disposition: A | Discharge status: Home / Self Care | Payer: 59 | Source: Ambulatory Visit | Attending: Orthopaedic Surgery | Admitting: Orthopaedic Surgery

## 2021-07-07 DIAGNOSIS — R7303 Prediabetes: Secondary | ICD-10-CM | POA: Insufficient documentation

## 2021-07-07 DIAGNOSIS — Z01818 Encounter for other preprocedural examination: Secondary | ICD-10-CM | POA: Insufficient documentation

## 2021-07-07 LAB — BASIC METABOLIC PANEL
Anion gap: 6 (ref 5–15)
BUN: 15 mg/dL (ref 8–23)
CO2: 26 mmol/L (ref 22–32)
Calcium: 9.8 mg/dL (ref 8.9–10.3)
Chloride: 106 mmol/L (ref 98–111)
Creatinine, Ser: 0.81 mg/dL (ref 0.61–1.24)
GFR, Estimated: >60 mL/min (ref >60)
Glucose, Bld: 110 mg/dL — ABNORMAL HIGH (ref 70–99)
Potassium: 3.7 mmol/L (ref 3.5–5.1)
Sodium: 138 mmol/L (ref 135–145)

## 2021-07-07 LAB — TYPE AND SCREEN
ABO/RH(D): O POS
Antibody Screen: NEGATIVE

## 2021-07-07 LAB — CBC WITH DIFFERENTIAL/PLATELET
Abs Immature Granulocytes: 0.02 10*3/uL (ref 0.00–0.07)
Basophils Absolute: 0 10*3/uL (ref 0.0–0.1)
Basophils Relative: 0 %
Eosinophils Absolute: 0.2 10*3/uL (ref 0.0–0.5)
Eosinophils Relative: 2 %
HCT: 39 % (ref 39.0–52.0)
Hemoglobin: 13.2 g/dL (ref 13.0–17.0)
Immature Granulocytes: 0 %
Lymphocytes Relative: 10 %
Lymphs Abs: 0.9 10*3/uL (ref 0.7–4.0)
MCH: 33.6 pg (ref 26.0–34.0)
MCHC: 33.8 g/dL (ref 30.0–36.0)
MCV: 99.2 fL (ref 80.0–100.0)
Monocytes Absolute: 0.9 10*3/uL (ref 0.1–1.0)
Monocytes Relative: 11 %
Neutro Abs: 6.9 10*3/uL (ref 1.7–7.7)
Neutrophils Relative %: 77 %
Platelets: 279 10*3/uL (ref 150–400)
RBC: 3.93 MIL/uL — ABNORMAL LOW (ref 4.22–5.81)
RDW: 12.2 % (ref 11.5–15.5)
WBC: 9 10*3/uL (ref 4.0–10.5)
nRBC: 0 % (ref 0.0–0.2)

## 2021-07-07 LAB — IRON,TIBC AND FERRITIN PANEL
%SAT: 28 % (calc) (ref 20–48)
Ferritin: 188 ng/mL (ref 24–380)
Iron: 93 ug/dL (ref 50–180)
TIBC: 335 mcg/dL (calc) (ref 250–425)

## 2021-07-07 LAB — URINALYSIS, ROUTINE W REFLEX MICROSCOPIC
Bilirubin Urine: NEGATIVE
Glucose, UA: NEGATIVE mg/dL
Hgb urine dipstick: NEGATIVE
Ketones, ur: NEGATIVE mg/dL
Leukocytes,Ua: NEGATIVE
Nitrite: NEGATIVE
Protein, ur: NEGATIVE mg/dL
Specific Gravity, Urine: 1.017 (ref 1.005–1.030)
pH: 6 (ref 5.0–8.0)

## 2021-07-07 LAB — PROTIME-INR
INR: 0.9 (ref 0.8–1.2)
Prothrombin Time: 12.4 seconds (ref 11.4–15.2)

## 2021-07-07 LAB — SURGICAL PCR SCREEN
MRSA, PCR: NEGATIVE
Staphylococcus aureus: POSITIVE — AB

## 2021-07-07 LAB — APTT: aPTT: 29 seconds (ref 24–36)

## 2021-07-07 NOTE — Progress Notes (Signed)
COVID Vaccine Completed: Yes Date COVID Vaccine completed: 02/02/21. X 4 COVID vaccine manufacturer:  Moderna     PCP - Dr. Billey Gosling Cardiologist - Dr. Clydene Laming. LOV: 12/28/20  Chest x-ray -  EKG - 12/28/20 [facility]. Requested Stress Test -  ECHO - 12/26/17 Cardiac Cath -  Pacemaker/ICD device last checked:  Sleep Study -  CPAP -   Fasting Blood Sugar -  Checks Blood Sugar _____ times a day  Blood Thinner Instructions: Aspirin Instructions: Last Dose:  Anesthesia review: Hx: HTN,SVT,Heart murmur.  Patient denies shortness of breath, fever, cough and chest pain at PAT appointment   Patient verbalized understanding of instructions that were given to them at the PAT appointment. Patient was also instructed that they will need to review over the PAT instructions again at home before surgery.

## 2021-07-07 NOTE — Progress Notes (Signed)
PCR: Positive STAPH.

## 2021-07-09 NOTE — Anesthesia Preprocedure Evaluation (Addendum)
Anesthesia Evaluation  Patient identified by MRN, date of birth, ID band Patient awake    Reviewed: Allergy & Precautions, NPO status , Patient's Chart, lab work & pertinent test results  History of Anesthesia Complications Negative for: history of anesthetic complications  Airway Mallampati: II  TM Distance: >3 FB Neck ROM: Full    Dental  (+) Dental Advisory Given, Teeth Intact   Pulmonary Current Smoker and Patient abstained from smoking.,    Pulmonary exam normal        Cardiovascular hypertension, Pt. on medications Normal cardiovascular exam+ dysrhythmias Supra Ventricular Tachycardia      Neuro/Psych negative neurological ROS  negative psych ROS   GI/Hepatic Neg liver ROS, GERD  Medicated and Controlled,  Endo/Other  negative endocrine ROS  Renal/GU negative Renal ROS     Musculoskeletal  (+) Arthritis , Osteoarthritis,   Gout    Abdominal   Peds  Hematology negative hematology ROS (+)   Anesthesia Other Findings HSV  Reproductive/Obstetrics                           Anesthesia Physical Anesthesia Plan  ASA: 3  Anesthesia Plan: Spinal   Post-op Pain Management:    Induction:   PONV Risk Score and Plan: 0 and Treatment may vary due to age or medical condition and Propofol infusion  Airway Management Planned: Natural Airway and Simple Face Mask  Additional Equipment: None  Intra-op Plan:   Post-operative Plan:   Informed Consent: I have reviewed the patients History and Physical, chart, labs and discussed the procedure including the risks, benefits and alternatives for the proposed anesthesia with the patient or authorized representative who has indicated his/her understanding and acceptance.       Plan Discussed with: CRNA and Anesthesiologist  Anesthesia Plan Comments: (Labs reviewed, platelets acceptable. Discussed risks and benefits of spinal, including  spinal/epidural hematoma, infection, failed block, and PDPH. Patient expressed understanding and wished to proceed. )      Anesthesia Quick Evaluation

## 2021-07-09 NOTE — Progress Notes (Signed)
Anesthesia Chart Review   Case: [id number] Date/Time: 07/20/21 0715   Procedure: LEFT TOTAL HIP ARTHROPLASTY ANTERIOR APPROACH (Left: Hip)   Anesthesia type: Spinal   Pre-op diagnosis: LEFT HIP DEGENERATIVE JOINT DISEASE   Location: [facility] ROOM 06 / [facility]   Surgeons: Melrose Nakayama, MD       DISCUSSION:65 y.o. some day smoker with h/o HTN, GERD, SVT, atrial fibrillation, left hip djd scheduled for above procedure 07/20/2021 with Dr. Melrose Nakayama.   Pt seen by cardiology 07/01/21. Per OV note, "Cleared from an EP perspective. He had a normal MRI 10/2020. He is active and participates on cardiovascular exercise multiple times per week with no limitations. Smokes an occasional cigar. Negative CTA 2020. ETT negative 12/2020. He is cleared to undergo hip replacement without need for further testing."  Anticipate pt can proceed with planned procedure barring acute status change.   VS: BP (!) 145/86   Pulse 76   Temp 37 C (Oral)   SpO2 96%   PROVIDERS: [name], MD is PCP    LABS: Labs reviewed: Acceptable for surgery. (all labs ordered are listed, but only abnormal results are displayed)  Labs Reviewed  SURGICAL PCR SCREEN - Abnormal; Notable for the following components:      Result Value   Staphylococcus aureus POSITIVE (*)    All other components within normal limits  CBC WITH DIFFERENTIAL/PLATELET - Abnormal; Notable for the following components:   RBC 3.93 (*)    All other components within normal limits  BASIC METABOLIC PANEL - Abnormal; Notable for the following components:   Glucose, Bld 110 (*)    All other components within normal limits  PROTIME-INR  APTT  URINALYSIS, ROUTINE W REFLEX MICROSCOPIC  TYPE AND SCREEN     IMAGES:   EKG:   CV: Echo 12/26/2017 Study Conclusions   - Left ventricle: The cavity size was normal. There was mild    concentric hypertrophy. Systolic function was normal. The    estimated ejection fraction was in the range of 60% to 65%.  Wall    motion was normal; there were no regional wall motion    abnormalities. Doppler parameters are consistent with abnormal    left ventricular relaxation (grade 1 diastolic dysfunction).  - Aortic valve: Transvalvular velocity was within the normal range.    There was no stenosis. There was no regurgitation.  - Mitral valve: There was no regurgitation.  - Left atrium: The atrium was mildly dilated.  - Right ventricle: The cavity size was normal. Wall thickness was    normal. Systolic function was normal.  - Atrial septum: No defect or patent foramen ovale was identified    by color flow Doppler.  - Tricuspid valve: There was trivial regurgitation.  - Pulmonary arteries: Systolic pressure was within the normal    range. PA peak pressure: 34 mm Hg (S).  Past Medical History:  Diagnosis Date   Allergy    RHINITIS   Anemia    Arthritis    Bursitis of left foot 11/11/2016   Colonic polyp 09/2006   colonoscopy   Ganglion 11/02/2017   GERD (gastroesophageal reflux disease)    Golfer's elbow, right 09/25/2019   Gout    Heart beat abnormality 12/14/2017   Heart murmur    Herpes genitalis in men 02/14/2014   Hypertension    Osteitis pubis ([place]) 01/27/2016   Primary osteoarthritis of left knee 11/06/2017   Right groin pain 11/08/2018   SOB (shortness of breath)  per pt, may be coming from new medication   Subluxation of distal radial-ulnar joint 12/03/2019   Subluxation of tendon of long head of biceps 10/15/2016   Injected shoulder 12/19/2016   Subungual hematoma of foot, initial encounter 05/29/2018   SVT (supraventricular tachycardia) ([place]) 03/09/2020    Past Surgical History:  Procedure Laterality Date   COLONOSCOPY  09/2006   KNEE ARTHROSCOPY Left 2011   KNEE SURGERY Left 08/2018   scar tissue surgery   LUMBAR DISC SURGERY  1990   SVT ABLATION N/A 01/09/2020   Procedure: SVT ABLATION;  Surgeon: Constance Haw, MD;  Location: [facility];  Service: Cardiovascular;   Laterality: N/A;   TOTAL KNEE ARTHROPLASTY Left 11/06/2017   Procedure: TOTAL KNEE ARTHROPLASTY;  Surgeon: Dorna Leitz, MD;  Location: [facility];  Service: Orthopedics;  Laterality: Left;    MEDICATIONS:  allopurinol (ZYLOPRIM) 300 MG tablet   aspirin EC 81 MG tablet   cholecalciferol (VITAMIN D) 1000 units tablet   dicyclomine (BENTYL) 10 MG/5ML solution   diltiazem (CARDIZEM CD) 360 MG 24 hr capsule   esomeprazole (NEXIUM) 40 MG capsule   famotidine (PEPCID) 40 MG tablet   Ferrous Sulfate (IRON) 325 (65 FE) MG TABS   fexofenadine (ALLEGRA) 180 MG tablet   fish oil-omega-3 fatty acids 1000 MG capsule   flecainide (TAMBOCOR) 50 MG tablet   fluticasone (FLONASE) 50 MCG/ACT nasal spray   gabapentin (NEURONTIN) 100 MG capsule   losartan-hydrochlorothiazide (HYZAAR) 50-12.5 MG tablet   meloxicam (MOBIC) 15 MG tablet   Misc Natural Products (TART CHERRY ADVANCED PO)   Multiple Vitamin (MULTIVITAMIN) tablet   rosuvastatin (CRESTOR) 20 MG tablet   sucralfate (CARAFATE) 1 g tablet   TART CHERRY PO   triamcinolone (KENALOG) 0.1 % paste   valACYclovir (VALTREX) 500 MG tablet   No current facility-administered medications for this encounter.     Konrad Felix Ward, PA-C [facility] [phone]

## 2021-07-12 NOTE — Progress Notes (Signed)
[facility] [place] Phone: [phone] Subjective:   Matthew [name], am serving as a scribe for Dr. Hulan Saas.  This visit occurred during the SARS-CoV-2 public health emergency.  Safety protocols were in place, including screening questions prior to the visit, additional usage of staff PPE, and extensive cleaning of exam room while observing appropriate contact time as indicated for disinfecting solutions.    I'm seeing this patient by the request  of:  [name], MD  CC: Neck pain and low back pain  [id number]  06/22/2021 Patient was found to have an hip fracture noted.  Seems to be the femoral head, and neck pain does extend into the intertrochanteric area.  Seems to be worsening in over the course of time with some synovitis also noted on MRI.  Patient does have moderate arthritic changes.  Discussed with patient at great length as well as with his wife.  Due to the severity of pain, the underlying arthritic changes as well as the patient's activity level I am concerned that this will not do well with any type of conservative therapy and possible surgical intervention is necessary.  Did discuss with him know that I would like him to discuss with the surgeon about the best possibility for treatment.  Patient will be referred accordingly at this time and will follow up with me again as needed for any other questions or other orthopedic problems.  Update 07/13/2021 Matthew [name] is a 65 y.o. male coming in with complaint of hip pain. Patient states that he is having R hand middle finger trigger finger. Otherwise is here for OMT prior to his hip replacement surgery next week.   Patient is having tightness of the neck as well as the lower back.  He did have some mild pain on the right groin area recently but thinks it is secondary to compensating.  This morning seem to be a little bit better.  Had to stop the  anti-inflammatory secondary to him undergoing surgery in 1 week        Past Medical History:  Diagnosis Date   Allergy    RHINITIS   Anemia    Arthritis    Bursitis of left foot 11/11/2016   Colonic polyp 09/2006   colonoscopy   Ganglion 11/02/2017   GERD (gastroesophageal reflux disease)    Golfer's elbow, right 09/25/2019   Gout    Heart beat abnormality 12/14/2017   Heart murmur    Herpes genitalis in men 02/14/2014   Hypertension    Osteitis pubis ([place]) 01/27/2016   Primary osteoarthritis of left knee 11/06/2017   Right groin pain 11/08/2018   SOB (shortness of breath)    per pt, may be coming from new medication   Subluxation of distal radial-ulnar joint 12/03/2019   Subluxation of tendon of long head of biceps 10/15/2016   Injected shoulder 12/19/2016   Subungual hematoma of foot, initial encounter 05/29/2018   SVT (supraventricular tachycardia) ([place]) 03/09/2020   Past Surgical History:  Procedure Laterality Date   COLONOSCOPY  09/2006   KNEE ARTHROSCOPY Left 2011   KNEE SURGERY Left 08/2018   scar tissue surgery   LUMBAR DISC SURGERY  1990   SVT ABLATION N/A 01/09/2020   Procedure: SVT ABLATION;  Surgeon: Constance Haw, MD;  Location: [facility];  Service: Cardiovascular;  Laterality: N/A;   TOTAL KNEE ARTHROPLASTY Left 11/06/2017   Procedure: TOTAL KNEE ARTHROPLASTY;  Surgeon: Dorna Leitz, MD;  Location: [facility];  Service: Orthopedics;  Laterality: Left;   Social History   Socioeconomic History   Marital status: Married    Spouse name: Not on file   Number of children: 1   Years of education: Not on file   Highest education level: Not on file  Occupational History   Not on file  Tobacco Use   Smoking status: Some Days    Types: Cigars   Smokeless tobacco: Never   Tobacco comments:    occasional cigar  Vaping Use   Vaping Use: Never used  Substance and Sexual Activity   Alcohol use: Yes    Alcohol/week: 3.0 standard drinks    Types: 3 Standard  drinks or equivalent per week    Comment: daily   Drug use: [name]   Sexual activity: Yes  Other Topics Concern   Not on file  Social History Narrative   Not on file   Social Determinants of Health   Financial Resource Strain: Not on file  Food Insecurity: Not on file  Transportation Needs: Not on file  Physical Activity: Not on file  Stress: Not on file  Social Connections: Not on file   Allergies  Allergen Reactions   Beta Adrenergic Blockers Other (See Comments)    Fatigue   Lisinopril Cough   Asa [Aspirin] Rash    High dosage ASA causes break out   Demerol Itching   Family History  Problem Relation Age of Onset   Stroke Father    Gout Father    Depression Brother    Dermatomyositis Daughter    Colon cancer Neg Hx    Rectal cancer Neg Hx    Esophageal cancer Neg Hx    Pancreatic cancer Neg Hx    Stomach cancer Neg Hx    Liver disease Neg Hx      Current Outpatient Medications (Cardiovascular):    diltiazem (CARDIZEM CD) 360 MG 24 hr capsule, TAKE 1 CAPSULE BY MOUTH EVERY DAY   flecainide (TAMBOCOR) 50 MG tablet, Take 50 mg by mouth 2 (two) times daily.   losartan-hydrochlorothiazide (HYZAAR) 50-12.5 MG tablet, TAKE 1 TABLET BY MOUTH EVERY DAY   rosuvastatin (CRESTOR) 20 MG tablet, TAKE 1 TABLET BY MOUTH EVERY DAY  Current Outpatient Medications (Respiratory):    fexofenadine (ALLEGRA) 180 MG tablet, Take 180 mg by mouth at bedtime.    fluticasone (FLONASE) 50 MCG/ACT nasal spray, Place into the nose.  Current Outpatient Medications (Analgesics):    allopurinol (ZYLOPRIM) 300 MG tablet, Take 1 tablet (300 mg total) by mouth daily.   aspirin EC 81 MG tablet, Take 81 mg by mouth daily.   meloxicam (MOBIC) 15 MG tablet, TAKE 1 TABLET BY MOUTH EVERY DAY  Current Outpatient Medications (Hematological):    Ferrous Sulfate (IRON) 325 (65 FE) MG TABS, Take 325 mg by mouth daily.   Current Outpatient Medications (Other):    cholecalciferol (VITAMIN D) 1000 units  tablet, Take 1,000 Units by mouth daily.   dicyclomine (BENTYL) 10 MG/5ML solution,    esomeprazole (NEXIUM) 40 MG capsule, TAKE 1 CAPSULE (40 MG TOTAL) BY MOUTH 2 (TWO) TIMES DAILY BEFORE A MEAL. TAKE ONE TABLET 30-60 MINUTES BEFORE BREAKFAST   famotidine (PEPCID) 40 MG tablet, Take 1 tablet (40 mg total) by mouth 2 (two) times daily. Take 1 tablet every morning and every night   fish oil-omega-3 fatty acids 1000 MG capsule, Take 3,600 g by mouth daily.   gabapentin (NEURONTIN) 100 MG  capsule, Take 4 capsules (400 mg total) by mouth at bedtime.   Misc Natural Products (TART CHERRY ADVANCED PO), Take 3,600 mg by mouth daily.   Multiple Vitamin (MULTIVITAMIN) tablet, Take 1 tablet by mouth daily.   sucralfate (CARAFATE) 1 g tablet, Take 1 tablet (1 g total) by mouth 4 (four) times daily. Take one tablet 30-60 minutes before breakfast.   TART CHERRY PO, Take by mouth.   triamcinolone (KENALOG) 0.1 % paste, SMARTSIG:1 TO TEETH Every Night   valACYclovir (VALTREX) 500 MG tablet, Take 500 mg by mouth daily.   Reviewed prior external information including notes and imaging from  primary care provider As well as notes that were available from care everywhere and other healthcare systems.  Past medical history, social, surgical and family history all reviewed in electronic medical record.  [name]   Review of Systems:  [name], visual changes, nausea, vomiting, diarrhea, constipation, dizziness, abdominal pain, skin rash, fevers, chills, night sweats, weight loss, swollen lymph nodes, joint swelling, chest pain, shortness of breath, mood changes. POSITIVE muscle aches, body aches  Objective  Blood pressure 118/82, pulse 74, height 6' (1.829 m), weight 193 lb (87.5 kg), SpO2 97 %.   General: [name], dressed appropriately.  HEENT: Pupils equal, extraocular movements intact   Respiratory: Patient's speak in full sentences and does not appear short of breath  Cardiovascular: [name], non tender, [name]  Gait antalgic gait MSK: Patient does have limited range of motion of the left hip significantly.  Patient does have tightness of the right hip but still improvement from the contralateral side.  Patient does have tightness of the neck noted.  Tightness in the lower back right greater than left.  Negative straight leg test though.  Patient does have a trigger nodule noted of the middle finger on the right side.  Triggering noted.  Tender to palpation.  Osteopathic findings C2 flexed rotated and side bent right C6 flexed rotated and side bent left T3 extended rotated and side bent right inhaled third rib T9 extended rotated and side bent left L2 flexed rotated and side bent right Sacrum right on right     Impression and Recommendations:    The above documentation has been reviewed and is accurate and complete Lyndal Pulley, DO

## 2021-07-13 ENCOUNTER — Ambulatory Visit (INDEPENDENT_AMBULATORY_CARE_PROVIDER_SITE_OTHER): Payer: 59 | Admitting: Family Medicine

## 2021-07-13 ENCOUNTER — Other Ambulatory Visit: Payer: Self-pay

## 2021-07-13 VITALS — BP 118/82 | HR 74 | Ht 72.0 in | Wt 193.0 lb

## 2021-07-13 DIAGNOSIS — M9902 Segmental and somatic dysfunction of thoracic region: Secondary | ICD-10-CM | POA: Diagnosis not present

## 2021-07-13 DIAGNOSIS — M9901 Segmental and somatic dysfunction of cervical region: Secondary | ICD-10-CM | POA: Diagnosis not present

## 2021-07-13 DIAGNOSIS — M9904 Segmental and somatic dysfunction of sacral region: Secondary | ICD-10-CM

## 2021-07-13 DIAGNOSIS — M9908 Segmental and somatic dysfunction of rib cage: Secondary | ICD-10-CM

## 2021-07-13 DIAGNOSIS — M542 Cervicalgia: Secondary | ICD-10-CM

## 2021-07-13 DIAGNOSIS — M9903 Segmental and somatic dysfunction of lumbar region: Secondary | ICD-10-CM | POA: Diagnosis not present

## 2021-07-13 NOTE — Assessment & Plan Note (Signed)
Chronic, mild exacerbation.  Patient has had some stress.  Do believe that patient's antalgic gait secondary to the hip pain is likely contributing to some of the discomfort and pain as well.  Patient is going to be having the hip replacement in the near future.  Discussed with patient about the trigger finger as well and patient is considering the possibility of surgical intervention and will check with his surgeon if he would be willing to do this in the future or if we should refer to a hand surgeon.  Patient will follow up with me again 6 to 8 weeks

## 2021-07-19 ENCOUNTER — Other Ambulatory Visit: Payer: Self-pay | Admitting: Cardiology

## 2021-07-19 DIAGNOSIS — R931 Abnormal findings on diagnostic imaging of heart and coronary circulation: Secondary | ICD-10-CM

## 2021-07-19 MED ORDER — TRANEXAMIC ACID 1000 MG/10ML IV SOLN
2000.0000 mg | INTRAVENOUS | Status: DC
  Filled 2021-07-19: qty 20

## 2021-07-19 NOTE — H&P (Signed)
TOTAL HIP ADMISSION H&P  Patient is admitted for left total hip arthroplasty.  Subjective:  Chief Complaint: left hip pain  HPI: Matthew [name], 65 y.o. male, has a history of pain and functional disability in the left hip(s) due to arthritis and patient has failed non-surgical conservative treatments for greater than 12 weeks to include NSAID's and/or analgesics, flexibility and strengthening excercises, use of assistive devices, weight reduction as appropriate, and activity modification.  Onset of symptoms was gradual starting 5 years ago with gradually worsening course since that time.The patient noted no past surgery on the left hip(s).  Patient currently rates pain in the left hip at 10 out of 10 with activity. Patient has night pain, worsening of pain with activity and weight bearing, trendelenberg gait, pain that interfers with activities of daily living, and crepitus. Patient has evidence of subchondral cysts, subchondral sclerosis, periarticular osteophytes, and joint space narrowing by imaging studies. This condition presents safety issues increasing the risk of falls. There is no current active infection.  Patient Active Problem List   Diagnosis Date Noted  . Prediabetes 07/07/2021  . Pre-op evaluation 07/05/2021  . Severe left groin pain 05/18/2021  . Lateral epicondylitis, left elbow 04/15/2021  . Left shoulder pain 02/24/2021  . Pharyngeal dysphagia, mild 01/17/2021  . Left-sided nosebleed 12/21/2020  . Acquired trigger finger of right middle finger 11/12/2020  . Sinusitis, chronic 08/18/2020  . History of atrial fibrillation 05/25/2020  . SVT (supraventricular tachycardia) ([place]) 03/09/2020  . Subluxation of distal radial-ulnar joint 12/03/2019  . Golfer's elbow, right 09/25/2019  . Former smoker 03/07/2019  . Right groin pain 11/08/2018  . Chronic knee pain after total replacement of left knee joint 06/04/2018  . Subungual hematoma of foot, initial encounter 05/29/2018   . Pain and swelling of left knee 05/29/2018  . Heart beat abnormality 12/14/2017  . [name] 11/15/2017  . Primary osteoarthritis of left knee 11/06/2017  . Lipoma of torso 11/02/2017  . Ganglion 11/02/2017  . Sleep deficient 03/22/2017  . Bursitis of left foot 11/11/2016  . Subluxation of tendon of long head of biceps 10/15/2016  . Leg cramps, sleep related 10/15/2016  . Hamstring tendinitis of left thigh 07/26/2016  . Gout 07/26/2016  . Osteitis pubis ([place]) 01/27/2016  . Neck tightness 08/12/2015  . Nonallopathic lesion of cervical region 08/12/2015  . Nonallopathic lesion of thoracic region 08/12/2015  . Nonallopathic lesion-rib cage 08/12/2015  . ACE-inhibitor cough 08/12/2015  . GERD (gastroesophageal reflux disease) 08/01/2014  . Neck pain 03/03/2014  . Hypertension 02/14/2014  . Allergic rhinitis 02/14/2014  . Herpes genitalis in men 02/14/2014  . Personal history of colonic polyps 05/06/2011  . Pes planus 04/30/2008   Past Medical History:  Diagnosis Date  . Allergy    RHINITIS  . Anemia   . Arthritis   . Bursitis of left foot 11/11/2016  . Colonic polyp 09/2006   colonoscopy  . Ganglion 11/02/2017  . GERD (gastroesophageal reflux disease)   . Golfer's elbow, right 09/25/2019  . Gout   . Heart beat abnormality 12/14/2017  . Heart murmur   . Herpes genitalis in men 02/14/2014  . Hypertension   . Osteitis pubis ([place]) 01/27/2016  . Primary osteoarthritis of left knee 11/06/2017  . Right groin pain 11/08/2018  . SOB (shortness of breath)    per pt, may be coming from new medication  . Subluxation of distal radial-ulnar joint 12/03/2019  . Subluxation of tendon of long head of biceps 10/15/2016  Injected shoulder 12/19/2016  . Subungual hematoma of foot, initial encounter 05/29/2018  . SVT (supraventricular tachycardia) ([place]) 03/09/2020    Past Surgical History:  Procedure Laterality Date  . COLONOSCOPY  09/2006  . KNEE ARTHROSCOPY Left 2011  . KNEE  SURGERY Left 08/2018   scar tissue surgery  . [place] SURGERY  1990  . SVT ABLATION N/A 01/09/2020   Procedure: SVT ABLATION;  Surgeon: Constance Haw, MD;  Location: [facility];  Service: Cardiovascular;  Laterality: N/A;  . TOTAL KNEE ARTHROPLASTY Left 11/06/2017   Procedure: TOTAL KNEE ARTHROPLASTY;  Surgeon: Dorna Leitz, MD;  Location: [facility];  Service: Orthopedics;  Laterality: Left;    Current Facility-Administered Medications  Medication Dose Route Frequency Provider Last Rate Last Admin  . [START ON 07/20/2021] tranexamic acid (CYKLOKAPRON) 2,000 mg in sodium chloride 0.9 % 50 mL Topical Application  [id number] mg Topical To OR Melrose Nakayama, MD       Current Outpatient Medications  Medication Sig Dispense Refill Last Dose  . allopurinol (ZYLOPRIM) 300 MG tablet Take 1 tablet (300 mg total) by mouth daily. 90 tablet 0   . aspirin EC 81 MG tablet Take 81 mg by mouth daily.     . cholecalciferol (VITAMIN D) 1000 units tablet Take 1,000 Units by mouth daily.     [name] dicyclomine (BENTYL) 10 MG/5ML solution      . diltiazem (CARDIZEM CD) 360 MG 24 hr capsule TAKE 1 CAPSULE BY MOUTH EVERY DAY 90 capsule 3   . esomeprazole (NEXIUM) 40 MG capsule TAKE 1 CAPSULE (40 MG TOTAL) BY MOUTH 2 (TWO) TIMES DAILY BEFORE A MEAL. TAKE ONE TABLET 30-60 MINUTES BEFORE BREAKFAST 60 capsule 3   . famotidine (PEPCID) 40 MG tablet Take 1 tablet (40 mg total) by mouth 2 (two) times daily. Take 1 tablet every morning and every night 60 tablet 3   . Ferrous Sulfate (IRON) 325 (65 FE) MG TABS Take 325 mg by mouth daily.      . fexofenadine (ALLEGRA) 180 MG tablet Take 180 mg by mouth at bedtime.      . fish oil-omega-3 fatty acids 1000 MG capsule Take 3,600 g by mouth daily.     . flecainide (TAMBOCOR) 50 MG tablet Take 50 mg by mouth 2 (two) times daily.     . fluticasone (FLONASE) 50 MCG/ACT nasal spray Place into the nose.     . gabapentin (NEURONTIN) 100 MG capsule Take 4 capsules (400 mg total) by  mouth at bedtime. 360 capsule 3   . losartan-hydrochlorothiazide (HYZAAR) 50-12.5 MG tablet TAKE 1 TABLET BY MOUTH EVERY DAY 90 tablet 0   . meloxicam (MOBIC) 15 MG tablet TAKE 1 TABLET BY MOUTH EVERY DAY 30 tablet 2   . Misc Natural Products (TART CHERRY ADVANCED PO) Take 3,600 mg by mouth daily.     . Multiple Vitamin (MULTIVITAMIN) tablet Take 1 tablet by mouth daily.     . rosuvastatin (CRESTOR) 20 MG tablet TAKE 1 TABLET BY MOUTH EVERY DAY 90 tablet 0   . sucralfate (CARAFATE) 1 g tablet Take 1 tablet (1 g total) by mouth 4 (four) times daily. Take one tablet 30-60 minutes before breakfast. 120 tablet 3   . TART CHERRY PO Take by mouth.     . triamcinolone (KENALOG) 0.1 % paste SMARTSIG:1 TO TEETH Every Night     . valACYclovir (VALTREX) 500 MG tablet Take 500 mg by mouth daily.      Allergies  Allergen Reactions  . Beta Adrenergic Blockers Other (See Comments)    Fatigue  . Lisinopril Cough  . Asa [Aspirin] Rash    High dosage ASA causes break out  . Demerol Itching    Social History   Tobacco Use  . Smoking status: Some Days    Types: Cigars  . Smokeless tobacco: Never  . Tobacco comments:    occasional cigar  Substance Use Topics  . Alcohol use: Yes    Alcohol/week: 3.0 standard drinks    Types: 3 Standard drinks or equivalent per week    Comment: daily    Family History  Problem Relation Age of Onset  . Stroke Father   . Gout Father   . Depression Brother   . Dermatomyositis Daughter   . Colon cancer Neg Hx   . Rectal cancer Neg Hx   . Esophageal cancer Neg Hx   . Pancreatic cancer Neg Hx   . Stomach cancer Neg Hx   . Liver disease Neg Hx      Review of Systems  Musculoskeletal:  Positive for arthralgias.       Left hip  All other systems reviewed and are negative.  Objective:  Physical Exam Constitutional:      Appearance: Normal appearance.  HENT:     Head: Normocephalic and atraumatic.     Nose: Nose normal.     Mouth/Throat:     Mouth: Mucous  membranes are moist.     Pharynx: Oropharynx is clear.  Eyes:     Extraocular Movements: Extraocular movements intact.  Cardiovascular:     Rate and Rhythm: Normal rate and regular rhythm.  Pulmonary:     Effort: Pulmonary effort is normal.  Abdominal:     Palpations: Abdomen is soft.  Musculoskeletal:     Cervical back: Normal range of motion.     Comments: Examination of the left hip shows significantly limited range of motion with pain with internal range of motion testing.  No real tenderness to palpation throughout.  He walks with a limp.  Leg lengths are roughly equal.  He is neurovascularly intact distally.  Right hip shows good range of motion.  No tenderness to palpation throughout.  He is neurovascularly intact distally.    Skin:    General: Skin is warm and dry.  Neurological:     General: No focal deficit present.     Mental Status: He is alert and oriented to person, place, and time. Mental status is at baseline.  Psychiatric:        Mood and Affect: Mood normal.        Behavior: Behavior normal.        Thought Content: Thought content normal.        Judgment: Judgment normal.    Vital signs in last 24 hours:    Labs:   Estimated body mass index is 26.18 kg/m as calculated from the following:   Height as of 07/13/21: 6' (1.829 m).   Weight as of 07/13/21: 87.5 kg.   Imaging Review Plain radiographs demonstrate severe degenerative joint disease of the left hip(s). The bone quality appears to be good for age and reported activity level.      Assessment/Plan:  End stage primary arthritis, left hip(s)  The patient history, physical examination, clinical judgement of the provider and imaging studies are consistent with end stage degenerative joint disease of the left hip(s) and total hip arthroplasty is deemed medically necessary. The treatment options including medical  management, injection therapy, arthroscopy and arthroplasty were discussed at length. The  risks and benefits of total hip arthroplasty were presented and reviewed. The risks due to aseptic loosening, infection, stiffness, dislocation/subluxation,  thromboembolic complications and other imponderables were discussed.  The patient acknowledged the explanation, agreed to proceed with the plan and consent was signed. Patient is being admitted for inpatient treatment for surgery, pain control, PT, OT, prophylactic antibiotics, VTE prophylaxis, progressive ambulation and ADL's and discharge planning.The patient is planning to be discharged home with home health services

## 2021-07-20 ENCOUNTER — Encounter: Payer: Self-pay | Admitting: Family Medicine

## 2021-07-20 ENCOUNTER — Ambulatory Visit (HOSPITAL_COMMUNITY): Payer: 59 | Admitting: Anesthesiology

## 2021-07-20 ENCOUNTER — Encounter (HOSPITAL_COMMUNITY): Payer: Self-pay | Admitting: Orthopaedic Surgery

## 2021-07-20 ENCOUNTER — Ambulatory Visit (HOSPITAL_COMMUNITY): Payer: 59

## 2021-07-20 ENCOUNTER — Other Ambulatory Visit: Payer: Self-pay

## 2021-07-20 ENCOUNTER — Ambulatory Visit (HOSPITAL_COMMUNITY)
Admission: RE | Admit: 2021-07-20 | Discharge: 2021-07-20 | Disposition: A | Discharge status: Home / Self Care | Payer: 59 | Source: Ambulatory Visit | Attending: Orthopaedic Surgery | Admitting: Orthopaedic Surgery

## 2021-07-20 ENCOUNTER — Encounter (HOSPITAL_COMMUNITY)
Admission: RE | Disposition: A | Discharge status: Home / Self Care | Payer: Self-pay | Source: Ambulatory Visit | Attending: Orthopaedic Surgery

## 2021-07-20 ENCOUNTER — Ambulatory Visit (HOSPITAL_COMMUNITY): Payer: 59 | Admitting: Physician Assistant

## 2021-07-20 DIAGNOSIS — Z791 Long term (current) use of non-steroidal anti-inflammatories (NSAID): Secondary | ICD-10-CM | POA: Diagnosis not present

## 2021-07-20 DIAGNOSIS — Z885 Allergy status to narcotic agent status: Secondary | ICD-10-CM | POA: Insufficient documentation

## 2021-07-20 DIAGNOSIS — Z79899 Other long term (current) drug therapy: Secondary | ICD-10-CM | POA: Diagnosis not present

## 2021-07-20 DIAGNOSIS — Z7982 Long term (current) use of aspirin: Secondary | ICD-10-CM | POA: Insufficient documentation

## 2021-07-20 DIAGNOSIS — Z886 Allergy status to analgesic agent status: Secondary | ICD-10-CM | POA: Diagnosis not present

## 2021-07-20 DIAGNOSIS — Z419 Encounter for procedure for purposes other than remedying health state, unspecified: Secondary | ICD-10-CM

## 2021-07-20 DIAGNOSIS — Z87891 Personal history of nicotine dependence: Secondary | ICD-10-CM | POA: Insufficient documentation

## 2021-07-20 DIAGNOSIS — Z888 Allergy status to other drugs, medicaments and biological substances status: Secondary | ICD-10-CM | POA: Insufficient documentation

## 2021-07-20 DIAGNOSIS — M1612 Unilateral primary osteoarthritis, left hip: Secondary | ICD-10-CM | POA: Diagnosis present

## 2021-07-20 HISTORY — PX: TOTAL HIP ARTHROPLASTY: SHX124

## 2021-07-20 SURGERY — ARTHROPLASTY, HIP, TOTAL, ANTERIOR APPROACH
Anesthesia: Spinal | Site: Hip | Laterality: Left

## 2021-07-20 MED ORDER — ASPIRIN EC 81 MG PO TBEC: 81.0000 mg | DELAYED_RELEASE_TABLET | Freq: Two times a day (BID) | ORAL | 0 refills | Status: DC

## 2021-07-20 MED ORDER — PROPOFOL 10 MG/ML IV BOLUS
INTRAVENOUS | Status: DC | PRN
  Administered 2021-07-20 (×3): 20 mg via INTRAVENOUS

## 2021-07-20 MED ORDER — EPHEDRINE SULFATE-NACL 50-0.9 MG/10ML-% IV SOSY
PREFILLED_SYRINGE | INTRAVENOUS | Status: DC | PRN
  Administered 2021-07-20: 5 mg via INTRAVENOUS

## 2021-07-20 MED ORDER — BUPIVACAINE-EPINEPHRINE (PF) 0.25% -1:200000 IJ SOLN
INTRAMUSCULAR | Status: AC
  Filled 2021-07-20: qty 30

## 2021-07-20 MED ORDER — MIDAZOLAM HCL 2 MG/2ML IJ SOLN
INTRAMUSCULAR | Status: AC
  Filled 2021-07-20: qty 2

## 2021-07-20 MED ORDER — PROPOFOL 1000 MG/100ML IV EMUL
INTRAVENOUS | Status: AC
  Filled 2021-07-20: qty 100

## 2021-07-20 MED ORDER — HYDROCODONE-ACETAMINOPHEN 7.5-325 MG PO TABS: 1.0000 | ORAL_TABLET | ORAL | Status: DC | PRN

## 2021-07-20 MED ORDER — HYDROCODONE-ACETAMINOPHEN 5-325 MG PO TABS
ORAL_TABLET | ORAL | Status: AC
  Filled 2021-07-20: qty 2

## 2021-07-20 MED ORDER — TRANEXAMIC ACID 1000 MG/10ML IV SOLN
INTRAVENOUS | Status: DC | PRN
  Administered 2021-07-20: 2000 mg via TOPICAL

## 2021-07-20 MED ORDER — STERILE WATER FOR IRRIGATION IR SOLN
Status: DC | PRN
Start: 2021-07-20 — End: 2021-07-20
  Administered 2021-07-20: 2000 mL

## 2021-07-20 MED ORDER — CEFAZOLIN SODIUM-DEXTROSE 2-4 GM/100ML-% IV SOLN
2.0000 g | INTRAVENOUS | Status: AC
  Administered 2021-07-20: 2 g via INTRAVENOUS
  Filled 2021-07-20: qty 100

## 2021-07-20 MED ORDER — PHENYLEPHRINE HCL-NACL 20-0.9 MG/250ML-% IV SOLN
INTRAVENOUS | Status: DC | PRN
  Administered 2021-07-20: 20 ug/min via INTRAVENOUS

## 2021-07-20 MED ORDER — OXYCODONE HCL 5 MG PO TABS
ORAL_TABLET | ORAL | Status: AC
  Filled 2021-07-20: qty 1

## 2021-07-20 MED ORDER — PROPOFOL 500 MG/50ML IV EMUL
INTRAVENOUS | Status: DC | PRN
  Administered 2021-07-20: 50 ug/kg/min via INTRAVENOUS

## 2021-07-20 MED ORDER — POVIDONE-IODINE 10 % EX SWAB: 2.0000 "application " | Freq: Once | CUTANEOUS | Status: DC

## 2021-07-20 MED ORDER — EPHEDRINE 5 MG/ML INJ
INTRAVENOUS | Status: AC
  Filled 2021-07-20: qty 5

## 2021-07-20 MED ORDER — ORAL CARE MOUTH RINSE: 15.0000 mL | Freq: Once | OROMUCOSAL | Status: AC

## 2021-07-20 MED ORDER — FENTANYL CITRATE (PF) 100 MCG/2ML IJ SOLN
INTRAMUSCULAR | Status: DC | PRN
  Administered 2021-07-20 (×2): 25 ug via INTRAVENOUS
  Administered 2021-07-20: 50 ug via INTRAVENOUS

## 2021-07-20 MED ORDER — OXYCODONE HCL 5 MG PO TABS
5.0000 mg | ORAL_TABLET | Freq: Once | ORAL | Status: AC | PRN
  Administered 2021-07-20: 5 mg via ORAL

## 2021-07-20 MED ORDER — LACTATED RINGERS IV SOLN: INTRAVENOUS | Status: DC

## 2021-07-20 MED ORDER — KETOROLAC TROMETHAMINE 30 MG/ML IJ SOLN
INTRAMUSCULAR | Status: AC
  Administered 2021-07-20: 30 mg
  Filled 2021-07-20: qty 1

## 2021-07-20 MED ORDER — ACETAMINOPHEN 325 MG PO TABS: 325.0000 mg | ORAL_TABLET | Freq: Four times a day (QID) | ORAL | Status: DC | PRN

## 2021-07-20 MED ORDER — METHOCARBAMOL 500 MG PO TABS
500.0000 mg | ORAL_TABLET | Freq: Four times a day (QID) | ORAL | Status: DC | PRN
Start: 2021-07-20 — End: 2021-07-20

## 2021-07-20 MED ORDER — ONDANSETRON HCL 4 MG PO TABS: 4.0000 mg | ORAL_TABLET | Freq: Four times a day (QID) | ORAL | Status: DC | PRN

## 2021-07-20 MED ORDER — LACTATED RINGERS IV BOLUS
250.0000 mL | Freq: Once | INTRAVENOUS | Status: AC
  Administered 2021-07-20: 250 mL via INTRAVENOUS

## 2021-07-20 MED ORDER — ONDANSETRON HCL 4 MG/2ML IJ SOLN
INTRAMUSCULAR | Status: AC
  Filled 2021-07-20: qty 2

## 2021-07-20 MED ORDER — BUPIVACAINE IN DEXTROSE 0.75-8.25 % IT SOLN
INTRATHECAL | Status: DC | PRN
  Administered 2021-07-20: 1.8 mL via INTRATHECAL

## 2021-07-20 MED ORDER — MORPHINE SULFATE (PF) 4 MG/ML IV SOLN: 0.5000 mg | INTRAVENOUS | Status: DC | PRN

## 2021-07-20 MED ORDER — METHOCARBAMOL 500 MG IVPB - SIMPLE MED: 500.0000 mg | Freq: Four times a day (QID) | INTRAVENOUS | Status: DC | PRN

## 2021-07-20 MED ORDER — TIZANIDINE HCL 4 MG PO TABS: 4.0000 mg | ORAL_TABLET | Freq: Four times a day (QID) | ORAL | 1 refills | Status: AC | PRN

## 2021-07-20 MED ORDER — FENTANYL CITRATE (PF) 100 MCG/2ML IJ SOLN
INTRAMUSCULAR | Status: AC
  Filled 2021-07-20: qty 2

## 2021-07-20 MED ORDER — LIDOCAINE HCL (CARDIAC) PF 100 MG/5ML IV SOSY
PREFILLED_SYRINGE | INTRAVENOUS | Status: DC | PRN
Start: 2021-07-20 — End: 2021-07-20
  Administered 2021-07-20: 60 mg via INTRAVENOUS

## 2021-07-20 MED ORDER — HYDROCODONE-ACETAMINOPHEN 5-325 MG PO TABS
1.0000 | ORAL_TABLET | ORAL | Status: DC | PRN
  Administered 2021-07-20: 2 via ORAL

## 2021-07-20 MED ORDER — FENTANYL CITRATE PF 50 MCG/ML IJ SOSY: 25.0000 ug | PREFILLED_SYRINGE | INTRAMUSCULAR | Status: DC | PRN

## 2021-07-20 MED ORDER — ONDANSETRON HCL 4 MG/2ML IJ SOLN
INTRAMUSCULAR | Status: DC | PRN
  Administered 2021-07-20: 4 mg via INTRAVENOUS

## 2021-07-20 MED ORDER — KETOROLAC TROMETHAMINE 15 MG/ML IJ SOLN
7.5000 mg | Freq: Four times a day (QID) | INTRAMUSCULAR | Status: DC
  Administered 2021-07-20: 7.5 mg via INTRAVENOUS

## 2021-07-20 MED ORDER — PHENYLEPHRINE HCL (PRESSORS) 10 MG/ML IV SOLN
INTRAVENOUS | Status: AC
  Filled 2021-07-20: qty 2

## 2021-07-20 MED ORDER — TRANEXAMIC ACID-NACL 1000-0.7 MG/100ML-% IV SOLN: 1000.0000 mg | Freq: Once | INTRAVENOUS | Status: AC

## 2021-07-20 MED ORDER — ONDANSETRON HCL 4 MG/2ML IJ SOLN: 4.0000 mg | Freq: Four times a day (QID) | INTRAMUSCULAR | Status: DC | PRN

## 2021-07-20 MED ORDER — DEXAMETHASONE SODIUM PHOSPHATE 10 MG/ML IJ SOLN
INTRAMUSCULAR | Status: AC
  Filled 2021-07-20: qty 1

## 2021-07-20 MED ORDER — BUPIVACAINE LIPOSOME 1.3 % IJ SUSP: 10.0000 mL | Freq: Once | INTRAMUSCULAR | Status: DC

## 2021-07-20 MED ORDER — CHLORHEXIDINE GLUCONATE 0.12 % MT SOLN
15.0000 mL | Freq: Once | OROMUCOSAL | Status: AC
  Administered 2021-07-20: 15 mL via OROMUCOSAL

## 2021-07-20 MED ORDER — LACTATED RINGERS IV BOLUS
500.0000 mL | Freq: Once | INTRAVENOUS | Status: AC
  Administered 2021-07-20: 500 mL via INTRAVENOUS

## 2021-07-20 MED ORDER — ACETAMINOPHEN 500 MG PO TABS: 500.0000 mg | ORAL_TABLET | Freq: Four times a day (QID) | ORAL | Status: DC

## 2021-07-20 MED ORDER — 0.9 % SODIUM CHLORIDE (POUR BTL) OPTIME
TOPICAL | Status: DC | PRN
  Administered 2021-07-20: 1000 mL

## 2021-07-20 MED ORDER — LIDOCAINE HCL (PF) 2 % IJ SOLN
INTRAMUSCULAR | Status: AC
  Filled 2021-07-20: qty 5

## 2021-07-20 MED ORDER — TRANEXAMIC ACID-NACL 1000-0.7 MG/100ML-% IV SOLN
1000.0000 mg | INTRAVENOUS | Status: AC
  Administered 2021-07-20: 1000 mg via INTRAVENOUS
  Filled 2021-07-20: qty 100

## 2021-07-20 MED ORDER — LACTATED RINGERS IV BOLUS: 250.0000 mL | Freq: Once | INTRAVENOUS | Status: DC

## 2021-07-20 MED ORDER — DEXAMETHASONE SODIUM PHOSPHATE 10 MG/ML IJ SOLN
INTRAMUSCULAR | Status: DC | PRN
  Administered 2021-07-20: 8 mg via INTRAVENOUS

## 2021-07-20 MED ORDER — BUPIVACAINE LIPOSOME 1.3 % IJ SUSP
INTRAMUSCULAR | Status: AC
  Filled 2021-07-20: qty 10

## 2021-07-20 MED ORDER — METOCLOPRAMIDE HCL 5 MG/ML IJ SOLN: 5.0000 mg | Freq: Three times a day (TID) | INTRAMUSCULAR | Status: DC | PRN

## 2021-07-20 MED ORDER — OXYCODONE HCL 5 MG/5ML PO SOLN
5.0000 mg | Freq: Once | ORAL | Status: AC | PRN
Start: 2021-07-20 — End: 2021-07-20

## 2021-07-20 MED ORDER — BUPIVACAINE LIPOSOME 1.3 % IJ SUSP
INTRAMUSCULAR | Status: DC | PRN
  Administered 2021-07-20: 10 mL

## 2021-07-20 MED ORDER — MIDAZOLAM HCL 2 MG/2ML IJ SOLN
INTRAMUSCULAR | Status: DC | PRN
  Administered 2021-07-20: 2 mg via INTRAVENOUS

## 2021-07-20 MED ORDER — TRANEXAMIC ACID-NACL 1000-0.7 MG/100ML-% IV SOLN
INTRAVENOUS | Status: AC
  Administered 2021-07-20: 1000 mg via INTRAVENOUS
  Filled 2021-07-20: qty 100

## 2021-07-20 MED ORDER — ONDANSETRON HCL 4 MG/2ML IJ SOLN: 4.0000 mg | Freq: Once | INTRAMUSCULAR | Status: DC | PRN

## 2021-07-20 MED ORDER — BUPIVACAINE-EPINEPHRINE (PF) 0.25% -1:200000 IJ SOLN
INTRAMUSCULAR | Status: DC | PRN
  Administered 2021-07-20: 30 mL

## 2021-07-20 MED ORDER — METOCLOPRAMIDE HCL 5 MG PO TABS: 5.0000 mg | ORAL_TABLET | Freq: Three times a day (TID) | ORAL | Status: DC | PRN

## 2021-07-20 MED ORDER — HYDROCODONE-ACETAMINOPHEN 5-325 MG PO TABS: 1.0000 | ORAL_TABLET | Freq: Four times a day (QID) | ORAL | 0 refills | Status: AC | PRN

## 2021-07-20 MED ORDER — CEFAZOLIN SODIUM-DEXTROSE 2-4 GM/100ML-% IV SOLN: 2.0000 g | Freq: Four times a day (QID) | INTRAVENOUS | Status: DC

## 2021-07-20 SURGICAL SUPPLY — 47 items
BAG COUNTER SPONGE SURGICOUNT (BAG) IMPLANT
BAG DECANTER FOR FLEXI CONT (MISCELLANEOUS) ×2 IMPLANT
BAG SPNG CNTER NS LX DISP (BAG)
BLADE SAW SGTL 18X1.27X75 (BLADE) ×2 IMPLANT
BOOTIES KNEE HIGH SLOAN (MISCELLANEOUS) ×2 IMPLANT
CELLS DAT CNTRL 66122 CELL SVR (MISCELLANEOUS) ×1 IMPLANT
COVER PERINEAL POST (MISCELLANEOUS) ×2 IMPLANT
COVER SURGICAL LIGHT HANDLE (MISCELLANEOUS) ×2 IMPLANT
CUP ACET GRIPTION SERIS 56 100 (Trauma) IMPLANT
DECANTER SPIKE VIAL GLASS SM (MISCELLANEOUS) ×2 IMPLANT
DRAPE FOOT SWITCH (DRAPES) ×2 IMPLANT
DRAPE IMP U-DRAPE 54X76 (DRAPES) ×2 IMPLANT
DRAPE ORTHO SPLIT 77X108 STRL (DRAPES)
DRAPE STERI IOBAN 125X83 (DRAPES) ×2 IMPLANT
DRAPE SURG ORHT 6 SPLT 77X108 (DRAPES) IMPLANT
DRAPE U-SHAPE 47X51 STRL (DRAPES) ×4 IMPLANT
DRSG AQUACEL AG ADV 3.5X 6 (GAUZE/BANDAGES/DRESSINGS) ×2 IMPLANT
DURAPREP 26ML APPLICATOR (WOUND CARE) ×2 IMPLANT
ELECT BLADE TIP CTD 4 INCH (ELECTRODE) ×2 IMPLANT
ELECT REM PT RETURN 15FT ADLT (MISCELLANEOUS) ×2 IMPLANT
ELIMINATOR HOLE APEX DEPUY (Hips) ×1 IMPLANT
GIPTION SERIES 56 100 (Trauma) ×2 IMPLANT
GLOVE SRG 8 PF TXTR STRL LF DI (GLOVE) ×2 IMPLANT
GLOVE SURG ENC MOIS LTX SZ8 (GLOVE) ×4 IMPLANT
GLOVE SURG UNDER POLY LF SZ8 (GLOVE) ×4
GOWN STRL REUS W/TWL XL LVL3 (GOWN DISPOSABLE) ×4 IMPLANT
HEAD CERAMIC DELTA 36 PLUS 1.5 (Hips) ×1 IMPLANT
HOLDER FOLEY CATH W/STRAP (MISCELLANEOUS) ×2 IMPLANT
KIT TURNOVER KIT A (KITS) ×2 IMPLANT
MANIFOLD NEPTUNE II (INSTRUMENTS) ×2 IMPLANT
NEEDLE HYPO 22GX1.5 SAFETY (NEEDLE) ×2 IMPLANT
NS IRRIG 1000ML POUR BTL (IV SOLUTION) ×2 IMPLANT
PACK ANTERIOR HIP CUSTOM (KITS) ×2 IMPLANT
PENCIL SMOKE EVACUATOR (MISCELLANEOUS) IMPLANT
PINNACLE ALTRX PLUS 4 N 36X56 (Hips) ×1 IMPLANT
PROTECTOR NERVE ULNAR (MISCELLANEOUS) ×2 IMPLANT
RETRACTOR WND ALEXIS 18 MED (MISCELLANEOUS) ×1 IMPLANT
RTRCTR WOUND ALEXIS 18CM MED (MISCELLANEOUS) ×2
STEM FEMORAL SZ6 HIGH ACTIS (Stem) ×1 IMPLANT
SUT ETHIBOND NAB CT1 #1 30IN (SUTURE) ×4 IMPLANT
SUT VIC AB 1 CT1 36 (SUTURE) ×2 IMPLANT
SUT VIC AB 2-0 CT1 27 (SUTURE) ×2
SUT VIC AB 2-0 CT1 TAPERPNT 27 (SUTURE) ×1 IMPLANT
SUT VICRYL AB 3-0 FS1 BRD 27IN (SUTURE) ×2 IMPLANT
SUT VLOC 180 0 24IN GS25 (SUTURE) ×2 IMPLANT
SYR 50ML LL SCALE MARK (SYRINGE) ×2 IMPLANT
TRAY FOLEY MTR SLVR 16FR STAT (SET/KITS/TRAYS/PACK) ×2 IMPLANT

## 2021-07-20 NOTE — Evaluation (Signed)
Physical Therapy Evaluation Patient Details Name: Matthew [name] MRN: [id number] DOB: 31-Mar-1956 Today's Date: 07/20/2021  History of Present Illness  Patient is 65 y.o. male s/p Lt THA anterior approach on 07/20/21 with PMH significant for OA, anemia, GERD, gout, HTN, SVT, Lt TKA in 2019.    Clinical Impression  HARDEEP REETZ is a 65 y.o. male POD 0 s/p Lt THA. Patient reports independence with mobility at baseline. Patient is now limited by functional impairments (see PT problem list below) and requires supervision for transfers and gait with RW. Patient was able to ambulate ~90 feet with RW and min guard/supervision and cues for safe walker management. Patient educated on safe sequencing for stair mobility and verbalized safe guarding position for people assisting with mobility. Patient instructed in exercises to facilitate ROM and circulation. Patient will benefit from continued skilled PT interventions to address impairments and progress towards PLOF. Patient has met mobility goals at adequate level for discharge home; will continue to follow if pt continues acute stay to progress towards Mod I goals.        Recommendations for follow up therapy are one component of a multi-disciplinary discharge planning process, led by the attending physician.  Recommendations may be updated based on patient status, additional functional criteria and insurance authorization.  Follow Up Recommendations Follow surgeon's recommendation for DC plan and follow-up therapies    Equipment Recommendations  Rolling walker with 5" wheels (given in PACU)    Recommendations for Other Services       Precautions / Restrictions Precautions Precautions: Fall Restrictions Weight Bearing Restrictions: No Other Position/Activity Restrictions: WBAT      Mobility  Bed Mobility Overal bed mobility: Modified Independent Bed Mobility: Supine to Sit;Sit to Supine     Supine to sit: Supervision;Modified  independent (Device/Increase time) Sit to supine: Supervision;Modified independent (Device/Increase time)   General bed mobility comments: HOB slightly elevated, pt completes with no extra time or assist    Transfers Overall transfer level: Needs assistance Equipment used: Rolling walker (2 wheeled) Transfers: Sit to/from Stand Sit to Stand: Supervision         General transfer comment: cues for safe hand placement, no overt LOB noted and no assist needed to rise.  Ambulation/Gait Ambulation/Gait assistance: Min guard;Supervision Gait Distance (Feet): 90 Feet Assistive device: Rolling walker (2 wheeled) Gait Pattern/deviations: Step-to pattern;Step-through pattern;Decreased stride length Gait velocity: decr   General Gait Details: cues for step to pattern and pt progressed to step through with no LOB noted throughout.  Stairs Stairs: Yes Stairs assistance: Min guard;Supervision Stair Management: One rail Left;Step to pattern;Sideways Number of Stairs: 3 General stair comments: cues for safe sequencing for side step technique with singel rail. no overt LOB noted throughout. pt verablized safe guarding position for family to provide.  Wheelchair Mobility    Modified Rankin (Stroke Patients Only)       Balance Overall balance assessment: Needs assistance Sitting-balance support: Feet supported Sitting balance-Leahy Scale: Good     Standing balance support: During functional activity;Bilateral upper extremity supported Standing balance-Leahy Scale: Fair                               Pertinent Vitals/Pain Pain Assessment: 0-10 Pain Score: 2  Pain Location: Lt hip Pain Descriptors / Indicators: Aching;Discomfort Pain Intervention(s): Limited activity within patient's tolerance;Monitored during session;Repositioned    Home Living Family/patient expects to be discharged to:: Private residence Living Arrangements: Spouse/significant other  Available Help  at Discharge: Family Type of Home: House Home Access: Stairs to enter Entrance Stairs-Rails: Left Entrance Stairs-Number of Steps: 4 Home Layout: Two level;Full bath on main level;Able to live on main level with bedroom/bathroom Home Equipment: Shower seat      Prior Function Level of Independence: Independent               Hand Dominance   Dominant Hand: Right    Extremity/Trunk Assessment   Upper Extremity Assessment Upper Extremity Assessment: Overall WFL for tasks assessed    Lower Extremity Assessment Lower Extremity Assessment: Overall WFL for tasks assessed    Cervical / Trunk Assessment Cervical / Trunk Assessment: Normal  Communication   Communication: No difficulties  Cognition Arousal/Alertness: Awake/alert Behavior During Therapy: WFL for tasks assessed/performed Overall Cognitive Status: Within Functional Limits for tasks assessed                                        General Comments      Exercises Total Joint Exercises Ankle Circles/Pumps: AROM;Both;Supine Quad Sets: AROM;Left;5 reps;Supine Short Arc Quad: AROM;Left;5 reps;Supine Heel Slides: AROM;Left;5 reps;Supine Hip ABduction/ADduction: AROM;Left;5 reps;Supine Long Arc Quad: AROM;Left;5 reps;Supine   Assessment/Plan    PT Assessment Patient needs continued PT services  PT Problem List Decreased strength;Decreased range of motion;Decreased activity tolerance;Decreased balance;Decreased mobility;Decreased knowledge of use of DME;Decreased knowledge of precautions       PT Treatment Interventions DME instruction;Gait training;Stair training;Functional mobility training;Therapeutic activities;Therapeutic exercise;Balance training;Patient/family education    PT Goals (Current goals can be found in the Care Plan section)  Acute Rehab PT Goals Patient Stated Goal: get back to golf PT Goal Formulation: With patient Time For Goal Achievement: 07/27/21 Potential to Achieve  Goals: Good    Frequency 7X/week   Barriers to discharge        Co-evaluation               AM-PAC PT "6 Clicks" Mobility  Outcome Measure Help needed turning from your back to your side while in a flat bed without using bedrails?: A Little Help needed moving from lying on your back to sitting on the side of a flat bed without using bedrails?: A Little Help needed moving to and from a bed to a chair (including a wheelchair)?: A Little Help needed standing up from a chair using your arms (e.g., wheelchair or bedside chair)?: A Little Help needed to walk in hospital room?: A Little Help needed climbing 3-5 steps with a railing? : A Little 6 Click Score: 18    End of Session Equipment Utilized During Treatment: Gait belt Activity Tolerance: Patient tolerated treatment well Patient left: with call bell/phone within reach;with family/visitor present;in bed Nurse Communication: Mobility status PT Visit Diagnosis: Muscle weakness (generalized) (M62.81);Difficulty in walking, not elsewhere classified (R26.2)    Time: 8889-1694 PT Time Calculation (min) (ACUTE ONLY): 24 min   Charges:   PT Evaluation $PT Eval Low Complexity: 1 Low PT Treatments $Gait Training: 8-22 mins        Verner Mould, DPT Acute Rehabilitation Services Office [phone] Pager [phone]   Jacques Navy 07/20/2021, 12:08 PM

## 2021-07-20 NOTE — Op Note (Signed)
PRE-OP DIAGNOSIS:  LEFT HIP DEGENERATIVE JOINT DISEASE POST-OP DIAGNOSIS: same PROCEDURE:  LEFT TOTAL HIP ARTHROPLASTY ANTERIOR APPROACH ANESTHESIA:  Spinal and MAC SURGEON:  Melrose Nakayama MD ASSISTANT:  Loni Dolly PA-C   INDICATIONS FOR PROCEDURE:  The patient is a 65 y.o. male with a long history of a painful hip.  This has persisted despite multiple conservative measures.  The patient has persisted with pain and dysfunction making rest and activity difficult.  A total hip replacement is offered as surgical treatment.  Informed operative consent was obtained after discussion of possible complications including reaction to anesthesia, infection, neurovascular injury, dislocation, DVT, PE, and death.  The importance of the postoperative rehab program to optimize result was stressed with the patient.  SUMMARY OF FINDINGS AND PROCEDURE:  Under the above anesthesia through a anterior approach an the Hana table a left THR was performed.  The patient had severe degenerative change and excellent bone quality.  We used DePuy components to replace the hip and these were size 6 high offset Actis femur capped with a +1.5 39m ceramic hip ball.  On the acetabular side we used a size 56 Gription shell with a plus 4 polyethylene liner.  We did use a hole eliminator.  ALoni DollyPA-C assisted throughout and was invaluable to the completion of the case in that he helped position and retract while I performed the procedure.  He also closed simultaneously to help minimize OR time.  I used fluoroscopy throughout the case to check position of components and leg lengths and read all these views myself.  DESCRIPTION OF PROCEDURE:  The patient was taken to the OR suite where the above anesthetic was applied.  The patient was then positioned on the Hana table supine.  All bony prominences were appropriately padded.  Prep and drape was then performed in normal sterile fashion.  The patient was given kefzol preoperative  antibiotic and an appropriate time out was performed.  We then took an anterior approach to the left hip.  Dissection was taken through adipose to the tensor fascia lata fascia.  This structure was incised longitudinally and we dissected in the intermuscular interval just medial to this muscle.  Cobra retractors were placed superior and inferior to the femoral neck superficial to the capsule.  A capsular incision was then made and the retractors were placed along the femoral neck.  Xray was brought in to get a good level for the femoral neck cut which was made with an oscillating saw and osteotome.  The femoral head was removed with a corkscrew.  The acetabulum was exposed and some labral tissues were excised. Reaming was taken to the inside wall of the pelvis and sequentially up to 1 mm smaller than the actual component.  A trial of components was done and then the aforementioned acetabular shell was placed in appropriate tilt and anteversion confirmed by fluoroscopy. The liner was placed along with the hole eliminator and attention was turned to the femur.  The leg was brought down and over into adduction and the elevator bar was used to raise the femur up gently in the wound.  The piriformis was released with care taken to preserve the obturator internus attachment and all of the posterior capsule. The femur was reamed and then broached to the appropriate size.  A trial reduction was done and the aforementioned head and neck assembly gave uKoreathe best stability in extension with external rotation.  Leg lengths were felt to be about equal by  fluoroscopic exam.  The trial components were removed and the wound irrigated.  We then placed the femoral component in appropriate anteversion.  The head was applied to a dry stem neck and the hip again reduced.  It was again stable in the aforementioned position.  The would was irrigated again followed by re-approximation of anterior capsule with ethibond suture. Tensor  fascia was repaired with V-loc suture  followed by deep closure with #O and #2 undyed vicryl.  Skin was closed with subQ stitch and steristrips followed by a sterile dressing.  EBL and IOF can be obtained from anesthesia records.  DISPOSITION:  The patient was extubated in the OR and taken to PACU in stable condition to be admitted to the Orthopedic Surgery for appropriate post-op care to include perioperative antibiotics and DVT prophylaxis.

## 2021-07-20 NOTE — Anesthesia Postprocedure Evaluation (Addendum)
Anesthesia Post Note  Patient: Matthew [name]  Procedure(s) Performed: LEFT TOTAL HIP ARTHROPLASTY ANTERIOR APPROACH (Left: Hip)     Patient location during evaluation: PACU Anesthesia Type: Spinal Level of consciousness: awake and alert Pain management: pain level controlled Vital Signs Assessment: post-procedure vital signs reviewed and stable Respiratory status: spontaneous breathing and respiratory function stable Cardiovascular status: blood pressure returned to baseline and stable Postop Assessment: spinal receding and no apparent nausea or vomiting Anesthetic complications: no   No notable events documented.  Last Vitals:  Vitals:   07/20/21 1015 07/20/21 1030  BP: 139/82 139/90  Pulse: (!) 59 64  Resp: 20   Temp:  36.4 C  SpO2: 97% 97%    Last Pain:  Vitals:   07/20/21 1030  TempSrc:   PainSc: 0-No pain                 Audry Pili

## 2021-07-20 NOTE — Transfer of Care (Signed)
Immediate Anesthesia Transfer of Care Note  Patient: Matthew [name]  Procedure(s) Performed: LEFT TOTAL HIP ARTHROPLASTY ANTERIOR APPROACH (Left: Hip)  Patient Location: PACU  Anesthesia Type:Spinal  Level of Consciousness: awake, alert , oriented and patient cooperative  Airway & Oxygen Therapy: Patient Spontanous Breathing  Post-op Assessment: Report given to RN and Post -op Vital signs reviewed and stable  Post vital signs: Reviewed and stable  Last Vitals:  Vitals Value Taken Time  BP 118/83 07/20/21 0910  Temp    Pulse 66 07/20/21 0912  Resp 15 07/20/21 0912  SpO2 100 % 07/20/21 0912  Vitals shown include unvalidated device data.  Last Pain:  Vitals:   07/20/21 0608  TempSrc: Oral  PainSc:          Complications: No notable events documented.

## 2021-07-20 NOTE — Anesthesia Procedure Notes (Signed)
Spinal  Patient location during procedure: OR Start time: 07/20/2021 7:32 AM End time: 07/20/2021 7:35 AM Reason for block: surgical anesthesia Staffing Performed: resident/CRNA  Anesthesiologist: Audry Pili, MD Resident/CRNA: Raenette Rover, CRNA Preanesthetic Checklist Completed: patient identified, IV checked, site marked, risks and benefits discussed, surgical consent, monitors and equipment checked, pre-op evaluation and timeout performed Spinal Block Patient position: sitting Prep: DuraPrep Patient monitoring: blood pressure, continuous pulse ox and heart rate Approach: midline Location: L3-4 Injection technique: single-shot Needle Needle type: Pencan  Needle gauge: 24 G Assessment Sensory level: T4 Events: CSF return

## 2021-07-20 NOTE — Interval H&P Note (Signed)
History and Physical Interval Note:  07/20/2021 7:23 AM  Matthew [name]  has presented today for surgery, with the diagnosis of LEFT HIP DEGENERATIVE JOINT DISEASE.  The various methods of treatment have been discussed with the patient and family. After consideration of risks, benefits and other options for treatment, the patient has consented to  Procedure(s): LEFT TOTAL HIP ARTHROPLASTY ANTERIOR APPROACH (Left) as a surgical intervention.  The patient's history has been reviewed, patient examined, no change in status, stable for surgery.  I have reviewed the patient's chart and labs.  Questions were answered to the patient's satisfaction.     Hessie Dibble

## 2021-07-25 ENCOUNTER — Encounter (HOSPITAL_COMMUNITY): Payer: Self-pay | Admitting: Orthopaedic Surgery

## 2021-07-29 ENCOUNTER — Other Ambulatory Visit: Payer: Self-pay | Admitting: Family Medicine

## 2021-07-30 ENCOUNTER — Encounter: Payer: Self-pay | Admitting: Family Medicine

## 2021-08-09 ENCOUNTER — Other Ambulatory Visit: Payer: Self-pay | Admitting: Physician Assistant

## 2021-08-23 ENCOUNTER — Encounter: Payer: Self-pay | Admitting: Internal Medicine

## 2021-08-28 ENCOUNTER — Other Ambulatory Visit: Payer: Self-pay | Admitting: Cardiology

## 2021-09-06 NOTE — Progress Notes (Signed)
Matthew [name] [address] [place] Phone: [phone] Subjective:   Matthew [name], am serving as a scribe for Dr. Hulan Saas. This visit occurred during the SARS-CoV-2 public health emergency.  Safety protocols were in place, including screening questions prior to the visit, additional usage of staff PPE, and extensive cleaning of exam room while observing appropriate contact time as indicated for disinfecting solutions.   I'm seeing this patient by the request  of:  [name], MD  CC: hip pain and other pain follow up   [id number]  Matthew [name] is a 65 y.o. male coming in with complaint of L hip pain. L THR on 07/20/2021. Patient states hip has no pain. Has been having a numbness in his left thumb and nose bleeds he wanted to speak about. Also he wanted to know about a triglycerides test. Patient's lipase was 192 but LDL was 42.     Past Medical History:  Diagnosis Date   Allergy    RHINITIS   Anemia    Arthritis    Bursitis of left foot 11/11/2016   Colonic polyp 09/2006   colonoscopy   Ganglion 11/02/2017   GERD (gastroesophageal reflux disease)    Golfer's elbow, right 09/25/2019   Gout    Heart beat abnormality 12/14/2017   Heart murmur    Herpes genitalis in men 02/14/2014   Hypertension    Osteitis pubis ([place]) 01/27/2016   Primary osteoarthritis of left knee 11/06/2017   Right groin pain 11/08/2018   SOB (shortness of breath)    per pt, may be coming from new medication   Subluxation of distal radial-ulnar joint 12/03/2019   Subluxation of tendon of long head of biceps 10/15/2016   Injected shoulder 12/19/2016   Subungual hematoma of foot, initial encounter 05/29/2018   SVT (supraventricular tachycardia) ([place]) 03/09/2020   Past Surgical History:  Procedure Laterality Date   COLONOSCOPY  09/2006   KNEE ARTHROSCOPY Left 2011   KNEE SURGERY Left 08/2018   scar tissue surgery   LUMBAR DISC SURGERY  1990   SVT  ABLATION N/A 01/09/2020   Procedure: SVT ABLATION;  Surgeon: Constance Haw, MD;  Location: [facility];  Service: Cardiovascular;  Laterality: N/A;   TOTAL HIP ARTHROPLASTY Left 07/20/2021   Procedure: LEFT TOTAL HIP ARTHROPLASTY ANTERIOR APPROACH;  Surgeon: Melrose Nakayama, MD;  Location: [facility];  Service: Orthopedics;  Laterality: Left;   TOTAL KNEE ARTHROPLASTY Left 11/06/2017   Procedure: TOTAL KNEE ARTHROPLASTY;  Surgeon: Dorna Leitz, MD;  Location: [facility];  Service: Orthopedics;  Laterality: Left;   Social History   Socioeconomic History   Marital status: Married    Spouse name: Not on file   Number of children: 1   Years of education: Not on file   Highest education level: Not on file  Occupational History   Not on file  Tobacco Use   Smoking status: Some Days    Types: Cigars   Smokeless tobacco: Never   Tobacco comments:    occasional cigar  Vaping Use   Vaping Use: Never used  Substance and Sexual Activity   Alcohol use: Yes    Alcohol/week: 3.0 standard drinks    Types: 3 Standard drinks or equivalent per week    Comment: daily   Drug use: No   Sexual activity: Yes  Other Topics Concern   Not on file  Social History Narrative   Not on file  Social Determinants of Health   Financial Resource Strain: Not on file  Food Insecurity: Not on file  Transportation Needs: Not on file  Physical Activity: Not on file  Stress: Not on file  Social Connections: Not on file   Allergies  Allergen Reactions   Beta Adrenergic Blockers Other (See Comments)    Fatigue   Lisinopril Cough   Asa [Aspirin] Rash    High dosage ASA causes break out   Demerol Itching   Family History  Problem Relation Age of Onset   Stroke Father    Gout Father    Depression Brother    Dermatomyositis Daughter    Colon cancer Neg Hx    Rectal cancer Neg Hx    Esophageal cancer Neg Hx    Pancreatic cancer Neg Hx    Stomach cancer Neg Hx    Liver disease Neg Hx       Current Outpatient Medications (Cardiovascular):    diltiazem (CARDIZEM CD) 360 MG 24 hr capsule, TAKE 1 CAPSULE BY MOUTH EVERY DAY   flecainide (TAMBOCOR) 50 MG tablet, Take 50 mg by mouth 2 (two) times daily.   losartan-hydrochlorothiazide (HYZAAR) 50-12.5 MG tablet, TAKE 1 TABLET BY MOUTH EVERY DAY   rosuvastatin (CRESTOR) 20 MG tablet, TAKE 1 TABLET BY MOUTH EVERY DAY  Current Outpatient Medications (Respiratory):    fexofenadine (ALLEGRA) 180 MG tablet, Take 180 mg by mouth at bedtime.    fluticasone (FLONASE) 50 MCG/ACT nasal spray, Place into the nose.   montelukast (SINGULAIR) 10 MG tablet, TAKE 1 TABLET BY MOUTH EVERYDAY AT BEDTIME  Current Outpatient Medications (Analgesics):    allopurinol (ZYLOPRIM) 300 MG tablet, Take 1 tablet (300 mg total) by mouth daily.   aspirin EC 81 MG tablet, Take 1 tablet (81 mg total) by mouth 2 (two) times daily after a meal.   HYDROcodone-acetaminophen (NORCO/VICODIN) 5-325 MG tablet, Take 1-2 tablets by mouth every 6 (six) hours as needed for moderate pain (post op pain).  Current Outpatient Medications (Hematological):    Ferrous Sulfate (IRON) 325 (65 FE) MG TABS, Take 325 mg by mouth daily.   Current Outpatient Medications (Other):    cholecalciferol (VITAMIN D) 1000 units tablet, Take 1,000 Units by mouth daily.   dicyclomine (BENTYL) 10 MG/5ML solution,    esomeprazole (NEXIUM) 40 MG capsule, TAKE 1 CAPSULE (40 MG TOTAL) BY MOUTH 2 (TWO) TIMES DAILY BEFORE A MEAL. TAKE ONE TABLET 30-60 MINUTES BEFORE BREAKFAST   famotidine (PEPCID) 40 MG tablet, TAKE 1 TABLET (40 MG TOTAL) BY MOUTH 2 (TWO) TIMES DAILY. TAKE 1 TABLET EVERY MORNING AND EVERY NIGHT   gabapentin (NEURONTIN) 100 MG capsule, Take 4 capsules (400 mg total) by mouth at bedtime.   Misc Natural Products (TART CHERRY ADVANCED PO), Take 3,600 mg by mouth daily.   Multiple Vitamin (MULTIVITAMIN) tablet, Take 1 tablet by mouth daily.   sucralfate (CARAFATE) 1 g tablet, Take 1  tablet (1 g total) by mouth 4 (four) times daily. Take one tablet 30-60 minutes before breakfast.   TART CHERRY PO, Take by mouth.   tiZANidine (ZANAFLEX) 4 MG tablet, Take 1 tablet (4 mg total) by mouth every 6 (six) hours as needed for muscle spasms.   triamcinolone (KENALOG) 0.1 % paste, SMARTSIG:1 TO TEETH Every Night   valACYclovir (VALTREX) 500 MG tablet, Take 500 mg by mouth daily.   Reviewed prior external information including notes and imaging from  primary care provider As well as notes that were available from care everywhere  and other healthcare systems.  Past medical history, social, surgical and family history all reviewed in electronic medical record.  No pertanent information unless stated regarding to the chief complaint.   Review of Systems:  No headache, visual changes, nausea, vomiting, diarrhea, constipation, dizziness, abdominal pain, skin rash, fevers, chills, night sweats, weight loss, swollen lymph nodes, body aches, joint swelling, chest pain, shortness of breath, mood changes.  Positive for nosebleeds  Objective  Blood pressure 140/82, pulse 79, height 6' (1.829 m), weight 193 lb (87.5 kg), SpO2 97 %.   General: No apparent distress alert and oriented x3 mood and affect normal, dressed appropriately.  HEENT: Pupils equal, extraocular movements intact  Respiratory: Patient's speak in full sentences and does not appear short of breath  Cardiovascular: No lower extremity edema, non tender, no erythema  Gait normal with good balance and coordination.  MSK: Patient left thumb on exam does not have any significant arthritic changes.  Patient states that there is some mild numbness noted    Impression and Recommendations:    The above documentation has been reviewed and is accurate and complete Lyndal Pulley, DO

## 2021-09-07 ENCOUNTER — Other Ambulatory Visit: Payer: Self-pay | Admitting: Family Medicine

## 2021-09-07 ENCOUNTER — Other Ambulatory Visit: Payer: Self-pay

## 2021-09-07 ENCOUNTER — Encounter: Payer: Self-pay | Admitting: Family Medicine

## 2021-09-07 ENCOUNTER — Ambulatory Visit (INDEPENDENT_AMBULATORY_CARE_PROVIDER_SITE_OTHER): Payer: 59 | Admitting: Family Medicine

## 2021-09-07 VITALS — BP 140/82 | HR 79 | Ht 72.0 in | Wt 193.0 lb

## 2021-09-07 DIAGNOSIS — R040 Epistaxis: Secondary | ICD-10-CM

## 2021-09-07 DIAGNOSIS — R200 Anesthesia of skin: Secondary | ICD-10-CM | POA: Diagnosis not present

## 2021-09-07 DIAGNOSIS — M1612 Unilateral primary osteoarthritis, left hip: Secondary | ICD-10-CM | POA: Diagnosis not present

## 2021-09-07 DIAGNOSIS — R202 Paresthesia of skin: Secondary | ICD-10-CM

## 2021-09-07 NOTE — Assessment & Plan Note (Signed)
Status post replacement and doing well.

## 2021-09-07 NOTE — Assessment & Plan Note (Signed)
Think it is more secondary to the compression in the area than truly anything else.  Discussed with patient being a avid golfer to potentially try a glove or thicker grip.  See if anything else seems to be more irritating.

## 2021-09-07 NOTE — Assessment & Plan Note (Signed)
Patient has had recurrent nosebleeds.  Will refer to ENT for further evaluation with this continuing.  Patient has had previous cauterization it sounds when he was a child.

## 2021-09-07 NOTE — Patient Instructions (Signed)
Ref ent nose bleeds See you again in 6 weeks

## 2021-09-22 ENCOUNTER — Other Ambulatory Visit: Payer: Self-pay | Admitting: Family Medicine

## 2021-09-22 DIAGNOSIS — M109 Gout, unspecified: Secondary | ICD-10-CM

## 2021-09-22 NOTE — Telephone Encounter (Signed)
[facility] is requesting to fill pt allopurinol. Looks as if pt has a new pcp Please advise [facility]

## 2021-09-26 ENCOUNTER — Encounter: Payer: Self-pay | Admitting: Family Medicine

## 2021-09-26 DIAGNOSIS — M109 Gout, unspecified: Secondary | ICD-10-CM

## 2021-09-27 MED ORDER — ALLOPURINOL 300 MG PO TABS: 300.0000 mg | ORAL_TABLET | Freq: Every day | ORAL | 0 refills | Status: DC

## 2021-10-02 ENCOUNTER — Other Ambulatory Visit: Payer: Self-pay | Admitting: Cardiology

## 2021-10-20 ENCOUNTER — Encounter: Payer: Self-pay | Admitting: Family Medicine

## 2021-10-20 NOTE — Progress Notes (Deleted)
[place] Phone: [phone] Subjective:    I'm seeing this patient by the request  of:  [name], MD  CC:   [id number]  09/07/2021 Think it is more secondary to the compression in the area than truly anything else.  Discussed with patient being a avid golfer to potentially try a glove or thicker grip.  See if anything else seems to be more irritating.  Update 10/21/2021 Matthew [name] is a 65 y.o. male coming in with complaint of L thumb pain. Hx of L THR. Patient states        Past Medical History:  Diagnosis Date   Allergy    RHINITIS   Anemia    Arthritis    Bursitis of left foot 11/11/2016   Colonic polyp 09/2006   colonoscopy   Ganglion 11/02/2017   GERD (gastroesophageal reflux disease)    Golfer's elbow, right 09/25/2019   Gout    Heart beat abnormality 12/14/2017   Heart murmur    Herpes genitalis in men 02/14/2014   Hypertension    Osteitis pubis ([place]) 01/27/2016   Primary osteoarthritis of left knee 11/06/2017   Right groin pain 11/08/2018   SOB (shortness of breath)    per pt, may be coming from new medication   Subluxation of distal radial-ulnar joint 12/03/2019   Subluxation of tendon of long head of biceps 10/15/2016   Injected shoulder 12/19/2016   Subungual hematoma of foot, initial encounter 05/29/2018   SVT (supraventricular tachycardia) ([place]) 03/09/2020   Past Surgical History:  Procedure Laterality Date   COLONOSCOPY  09/2006   KNEE ARTHROSCOPY Left 2011   KNEE SURGERY Left 08/2018   scar tissue surgery   LUMBAR DISC SURGERY  1990   SVT ABLATION N/A 01/09/2020   Procedure: SVT ABLATION;  Surgeon: Constance Haw, MD;  Location: [facility];  Service: Cardiovascular;  Laterality: N/A;   TOTAL HIP ARTHROPLASTY Left 07/20/2021   Procedure: LEFT TOTAL HIP ARTHROPLASTY ANTERIOR APPROACH;  Surgeon: Melrose Nakayama, MD;  [facility];  Service: Orthopedics;   Laterality: Left;   TOTAL KNEE ARTHROPLASTY Left 11/06/2017   Procedure: TOTAL KNEE ARTHROPLASTY;  Surgeon: Dorna Leitz, MD;  Location: [facility];  Service: Orthopedics;  Laterality: Left;   Social History   Socioeconomic History   Marital status: Married    Spouse name: Not on file   Number of children: 1   Years of education: Not on file   Highest education level: Not on file  Occupational History   Not on file  Tobacco Use   Smoking status: Some Days    Types: Cigars   Smokeless tobacco: Never   Tobacco comments:    occasional cigar  Vaping Use   Vaping Use: Never used  Substance and Sexual Activity   Alcohol use: Yes    Alcohol/week: 3.0 standard drinks    Types: 3 Standard drinks or equivalent per week    Comment: daily   Drug use: No   Sexual activity: Yes  Other Topics Concern   Not on file  Social History Narrative   Not on file   Social Determinants of Health   Financial Resource Strain: Not on file  Food Insecurity: Not on file  Transportation Needs: Not on file  Physical Activity: Not on file  Stress: Not on file  Social Connections: Not on file   Allergies  Allergen Reactions   Beta Adrenergic Blockers Other (  See Comments)    Fatigue   Lisinopril Cough   Asa [Aspirin] Rash    High dosage ASA causes break out   Demerol Itching   Family History  Problem Relation Age of Onset   Stroke Father    Gout Father    Depression Brother    Dermatomyositis Daughter    Colon cancer Neg Hx    Rectal cancer Neg Hx    Esophageal cancer Neg Hx    Pancreatic cancer Neg Hx    Stomach cancer Neg Hx    Liver disease Neg Hx      Current Outpatient Medications (Cardiovascular):    diltiazem (CARDIZEM CD) 360 MG 24 hr capsule, TAKE 1 CAPSULE BY MOUTH EVERY DAY   flecainide (TAMBOCOR) 50 MG tablet, Take 50 mg by mouth 2 (two) times daily.   losartan-hydrochlorothiazide (HYZAAR) 50-12.5 MG tablet, TAKE 1 TABLET BY MOUTH EVERY DAY   rosuvastatin (CRESTOR) 20 MG  tablet, TAKE 1 TABLET BY MOUTH EVERY DAY  Current Outpatient Medications (Respiratory):    fexofenadine (ALLEGRA) 180 MG tablet, Take 180 mg by mouth at bedtime.    fluticasone (FLONASE) 50 MCG/ACT nasal spray, Place into the nose.   montelukast (SINGULAIR) 10 MG tablet, TAKE 1 TABLET BY MOUTH EVERYDAY AT BEDTIME  Current Outpatient Medications (Analgesics):    allopurinol (ZYLOPRIM) 300 MG tablet, Take 1 tablet (300 mg total) by mouth daily.   aspirin EC 81 MG tablet, Take 1 tablet (81 mg total) by mouth 2 (two) times daily after a meal.   HYDROcodone-acetaminophen (NORCO/VICODIN) 5-325 MG tablet, Take 1-2 tablets by mouth every 6 (six) hours as needed for moderate pain (post op pain).   meloxicam (MOBIC) 15 MG tablet, TAKE 1 TABLET BY MOUTH EVERY DAY  Current Outpatient Medications (Hematological):    Ferrous Sulfate (IRON) 325 (65 FE) MG TABS, Take 325 mg by mouth daily.   Current Outpatient Medications (Other):    cholecalciferol (VITAMIN D) 1000 units tablet, Take 1,000 Units by mouth daily.   dicyclomine (BENTYL) 10 MG/5ML solution,    esomeprazole (NEXIUM) 40 MG capsule, TAKE 1 CAPSULE (40 MG TOTAL) BY MOUTH 2 (TWO) TIMES DAILY BEFORE A MEAL. TAKE ONE TABLET 30-60 MINUTES BEFORE BREAKFAST   famotidine (PEPCID) 40 MG tablet, TAKE 1 TABLET (40 MG TOTAL) BY MOUTH 2 (TWO) TIMES DAILY. TAKE 1 TABLET EVERY MORNING AND EVERY NIGHT   gabapentin (NEURONTIN) 100 MG capsule, Take 4 capsules (400 mg total) by mouth at bedtime.   Misc Natural Products (TART CHERRY ADVANCED PO), Take 3,600 mg by mouth daily.   Multiple Vitamin (MULTIVITAMIN) tablet, Take 1 tablet by mouth daily.   sucralfate (CARAFATE) 1 g tablet, Take 1 tablet (1 g total) by mouth 4 (four) times daily. Take one tablet 30-60 minutes before breakfast.   TART CHERRY PO, Take by mouth.   tiZANidine (ZANAFLEX) 4 MG tablet, Take 1 tablet (4 mg total) by mouth every 6 (six) hours as needed for muscle spasms.   triamcinolone (KENALOG)  0.1 % paste, SMARTSIG:1 TO TEETH Every Night   valACYclovir (VALTREX) 500 MG tablet, Take 500 mg by mouth daily.   Reviewed prior external information including notes and imaging from  primary care provider As well as notes that were available from care everywhere and other healthcare systems.  Past medical history, social, surgical and family history all reviewed in electronic medical record.  No pertanent information unless stated regarding to the chief complaint.   Review of Systems:  No headache, visual  changes, nausea, vomiting, diarrhea, constipation, dizziness, abdominal pain, skin rash, fevers, chills, night sweats, weight loss, swollen lymph nodes, body aches, joint swelling, chest pain, shortness of breath, mood changes. POSITIVE muscle aches  Objective  There were no vitals taken for this visit.   General: No apparent distress alert and oriented x3 mood and affect normal, dressed appropriately.  HEENT: Pupils equal, extraocular movements intact  Respiratory: Patient's speak in full sentences and does not appear short of breath  Cardiovascular: No lower extremity edema, non tender, no erythema  Gait normal with good balance and coordination.  MSK:  Non tender with full range of motion and good stability and symmetric strength and tone of shoulders, elbows, wrist, hip, knee and ankles bilaterally.     Impression and Recommendations:     The above documentation has been reviewed and is accurate and complete [name]

## 2021-10-21 ENCOUNTER — Ambulatory Visit: Payer: 59 | Admitting: Family Medicine

## 2021-11-01 ENCOUNTER — Other Ambulatory Visit: Payer: Self-pay | Admitting: Cardiology

## 2021-12-17 ENCOUNTER — Other Ambulatory Visit: Payer: Self-pay | Admitting: Family Medicine

## 2021-12-17 DIAGNOSIS — M109 Gout, unspecified: Secondary | ICD-10-CM

## 2021-12-18 ENCOUNTER — Other Ambulatory Visit: Payer: Self-pay | Admitting: Physician Assistant

## 2022-01-05 NOTE — Progress Notes (Signed)
Matthew [name] [address] [place] Phone: [phone] Subjective:   Matthew [name], am serving as a scribe for Dr. Hulan Saas. This visit occurred during the SARS-CoV-2 public health emergency.  Safety protocols were in place, including screening questions prior to the visit, additional usage of staff PPE, and extensive cleaning of exam room while observing appropriate contact time as indicated for disinfecting solutions.   I'm seeing this patient by the request  of:  [name], MD  CC: Right knee pain  HMC:[id number]  09/07/2021 Think it is more secondary to the compression in the area than truly anything else.  Discussed with patient being a avid golfer to potentially try a glove or thicker grip.  See if anything else seems to be more irritating.  Patient has had recurrent nosebleeds.  Will refer to ENT for further evaluation with this continuing.  Patient has had previous cauterization it sounds when he was a child.  Updated 01/06/2022 Matthew [name] is a 66 y.o. male coming in with complaint of right knee pain. Started hurting about a month ago. Location is on the medial side. Pain is sporadic. Can produce pain by bending over.  Patient states it does feel somewhat similar to the contralateral knee before he had a replacement.  Not quite as severe though but does not       Past Medical History:  Diagnosis Date   Allergy    RHINITIS   Anemia    Arthritis    Bursitis of left foot 11/11/2016   Colonic polyp 09/2006   colonoscopy   Ganglion 11/02/2017   GERD (gastroesophageal reflux disease)    Golfer's elbow, right 09/25/2019   Gout    Heart beat abnormality 12/14/2017   Heart murmur    Herpes genitalis in men 02/14/2014   Hypertension    Osteitis pubis ([place]) 01/27/2016   Primary osteoarthritis of left knee 11/06/2017   Right groin pain 11/08/2018   SOB (shortness of breath)    per pt, may be coming from new medication    Subluxation of distal radial-ulnar joint 12/03/2019   Subluxation of tendon of long head of biceps 10/15/2016   Injected shoulder 12/19/2016   Subungual hematoma of foot, initial encounter 05/29/2018   SVT (supraventricular tachycardia) ([place]) 03/09/2020   Past Surgical History:  Procedure Laterality Date   COLONOSCOPY  09/2006   KNEE ARTHROSCOPY Left 2011   KNEE SURGERY Left 08/2018   scar tissue surgery   LUMBAR DISC SURGERY  1990   SVT ABLATION N/A 01/09/2020   Procedure: SVT ABLATION;  Surgeon: Constance Haw, MD;  Location: [facility];  Service: Cardiovascular;  Laterality: N/A;   TOTAL HIP ARTHROPLASTY Left 07/20/2021   Procedure: LEFT TOTAL HIP ARTHROPLASTY ANTERIOR APPROACH;  Surgeon: Melrose Nakayama, MD;  Location: [facility];  Service: Orthopedics;  Laterality: Left;   TOTAL KNEE ARTHROPLASTY Left 11/06/2017   Procedure: TOTAL KNEE ARTHROPLASTY;  Surgeon: Dorna Leitz, MD;  Location: [facility];  Service: Orthopedics;  Laterality: Left;   Social History   Socioeconomic History   Marital status: Married    Spouse name: Not on file   Number of children: 1   Years of education: Not on file   Highest education level: Not on file  Occupational History   Not on file  Tobacco Use   Smoking status: Some Days    Types: Cigars   Smokeless tobacco: Never   Tobacco comments:  occasional cigar  Vaping Use   Vaping Use: Never used  Substance and Sexual Activity   Alcohol use: Yes    Alcohol/week: 3.0 standard drinks    Types: 3 Standard drinks or equivalent per week    Comment: daily   Drug use: No   Sexual activity: Yes  Other Topics Concern   Not on file  Social History Narrative   Not on file   Social Determinants of Health   Financial Resource Strain: Not on file  Food Insecurity: Not on file  Transportation Needs: Not on file  Physical Activity: Not on file  Stress: Not on file  Social Connections: Not on file   Allergies  Allergen Reactions   Beta  Adrenergic Blockers Other (See Comments)    Fatigue   Lisinopril Cough   Asa [Aspirin] Rash    High dosage ASA causes break out   Demerol Itching   Family History  Problem Relation Age of Onset   Stroke Father    Gout Father    Depression Brother    Dermatomyositis Daughter    Colon cancer Neg Hx    Rectal cancer Neg Hx    Esophageal cancer Neg Hx    Pancreatic cancer Neg Hx    Stomach cancer Neg Hx    Liver disease Neg Hx      Current Outpatient Medications (Cardiovascular):    diltiazem (CARDIZEM CD) 360 MG 24 hr capsule, TAKE 1 CAPSULE BY MOUTH EVERY DAY   flecainide (TAMBOCOR) 50 MG tablet, Take 50 mg by mouth 2 (two) times daily.   losartan-hydrochlorothiazide (HYZAAR) 50-12.5 MG tablet, TAKE 1 TABLET BY MOUTH EVERY DAY   rosuvastatin (CRESTOR) 20 MG tablet, TAKE 1 TABLET BY MOUTH EVERY DAY  Current Outpatient Medications (Respiratory):    fexofenadine (ALLEGRA) 180 MG tablet, Take 180 mg by mouth at bedtime.    fluticasone (FLONASE) 50 MCG/ACT nasal spray, Place into the nose.   montelukast (SINGULAIR) 10 MG tablet, TAKE 1 TABLET BY MOUTH EVERYDAY AT BEDTIME  Current Outpatient Medications (Analgesics):    allopurinol (ZYLOPRIM) 300 MG tablet, TAKE 1 TABLET BY MOUTH EVERY DAY   aspirin EC 81 MG tablet, Take 1 tablet (81 mg total) by mouth 2 (two) times daily after a meal.   HYDROcodone-acetaminophen (NORCO/VICODIN) 5-325 MG tablet, Take 1-2 tablets by mouth every 6 (six) hours as needed for moderate pain (post op pain).   meloxicam (MOBIC) 15 MG tablet, TAKE 1 TABLET BY MOUTH EVERY DAY  Current Outpatient Medications (Hematological):    Ferrous Sulfate (IRON) 325 (65 FE) MG TABS, Take 325 mg by mouth daily.   Current Outpatient Medications (Other):    cholecalciferol (VITAMIN D) 1000 units tablet, Take 1,000 Units by mouth daily.   dicyclomine (BENTYL) 10 MG/5ML solution,    esomeprazole (NEXIUM) 40 MG capsule, TAKE 1 CAPSULE (40 MG TOTAL) BY MOUTH 2 (TWO) TIMES  DAILY BEFORE A MEAL. TAKE ONE TABLET 30-60 MINUTES BEFORE BREAKFAST   famotidine (PEPCID) 40 MG tablet, TAKE 1 TABLET BY MOUTH 2 TIMES DAILY. TAKE 1 TABLET EVERY MORNING AND EVERY NIGHT   gabapentin (NEURONTIN) 100 MG capsule, Take 4 capsules (400 mg total) by mouth at bedtime.   Misc Natural Products (TART CHERRY ADVANCED PO), Take 3,600 mg by mouth daily.   Multiple Vitamin (MULTIVITAMIN) tablet, Take 1 tablet by mouth daily.   sucralfate (CARAFATE) 1 g tablet, Take 1 tablet (1 g total) by mouth 4 (four) times daily. Take one tablet 30-60 minutes before breakfast.  TART CHERRY PO, Take by mouth.   tiZANidine (ZANAFLEX) 4 MG tablet, Take 1 tablet (4 mg total) by mouth every 6 (six) hours as needed for muscle spasms.   triamcinolone (KENALOG) 0.1 % paste, SMARTSIG:1 TO TEETH Every Night   valACYclovir (VALTREX) 500 MG tablet, Take 500 mg by mouth daily.   Reviewed prior external information including notes and imaging from  primary care provider As well as notes that were available from care everywhere and other healthcare systems.  Past medical history, social, surgical and family history all reviewed in electronic medical record.  No pertanent information unless stated regarding to the chief complaint.   Review of Systems:  No headache, visual changes, nausea, vomiting, diarrhea, constipation, dizziness, abdominal pain, skin rash, fevers, chills, night sweats, weight loss, swollen lymph nodes, body aches, joint swelling, chest pain, shortness of breath, mood changes. POSITIVE muscle aches  Objective  Blood pressure 128/76, pulse 71, height 6' (1.829 m), weight 197 lb (89.4 kg), SpO2 97 %.   General: No apparent distress alert and oriented x3 mood and affect normal, dressed appropriately.  HEENT: Pupils equal, extraocular movements intact  Respiratory: Patient's speak in full sentences and does not appear short of breath  Cardiovascular: No lower extremity edema, non tender, no erythema   Gait antalgic gait.  Does have an effusion noted on the right knee.  Does have limited range of motion in flexion and extension.  Instability with valgus and varus force.  Procedure: Real-time Ultrasound Guided Injection of right knee Device: GE Logiq Q7 Ultrasound guided injection is preferred based studies that show increased duration, increased effect, greater accuracy, decreased procedural pain, increased response rate, and decreased cost with ultrasound guided versus blind injection.  Verbal informed consent obtained.  Time-out conducted.  Noted no overlying erythema, induration, or other signs of local infection.  Skin prepped in a sterile fashion.  Local anesthesia: Topical Ethyl chloride.  With sterile technique and under real time ultrasound guidance: With a 22-gauge 2 inch needle patient was injected with 4 cc of 0.5% Marcaine and aspirated 35 cc of straw light-colored fluid and then injected 1 cc of Kenalog 40 mg/dL. This was from a superior lateral approach.  Completed without difficulty  Pain immediately resolved suggesting accurate placement of the medication.  Advised to call if fevers/chills, erythema, induration, drainage, or persistent bleeding.  Impression: Technically successful ultrasound guided injection.    Impression and Recommendations:    The above documentation has been reviewed and is accurate and complete Lyndal Pulley, DO

## 2022-01-06 ENCOUNTER — Ambulatory Visit: Payer: 59 | Admitting: Family Medicine

## 2022-01-06 ENCOUNTER — Other Ambulatory Visit: Payer: Self-pay

## 2022-01-06 ENCOUNTER — Ambulatory Visit: Payer: Self-pay

## 2022-01-06 VITALS — BP 128/76 | HR 71 | Ht 72.0 in | Wt 197.0 lb

## 2022-01-06 DIAGNOSIS — M2550 Pain in unspecified joint: Secondary | ICD-10-CM | POA: Diagnosis not present

## 2022-01-06 DIAGNOSIS — M1711 Unilateral primary osteoarthritis, right knee: Secondary | ICD-10-CM

## 2022-01-06 DIAGNOSIS — M25561 Pain in right knee: Secondary | ICD-10-CM

## 2022-01-06 NOTE — Patient Instructions (Signed)
Good to see you! °See you again in 4-6 weeks °

## 2022-01-07 ENCOUNTER — Other Ambulatory Visit: Payer: Self-pay | Admitting: Family Medicine

## 2022-01-07 ENCOUNTER — Encounter: Payer: Self-pay | Admitting: Family Medicine

## 2022-01-07 DIAGNOSIS — M1711 Unilateral primary osteoarthritis, right knee: Secondary | ICD-10-CM | POA: Insufficient documentation

## 2022-01-07 LAB — SYNOVIAL FLUID ANALYSIS, COMPLETE
Basophils, %: 0 %
Eosinophils-Synovial: 0 % (ref 0–2)
Lymphocytes-Synovial Fld: 52 % (ref 0–74)
Monocyte/Macrophage: 31 % (ref 0–69)
Neutrophil, Synovial: 17 % (ref 0–24)
Synoviocytes, %: 0 % (ref 0–15)
WBC, Synovial: 176 cells/uL — ABNORMAL HIGH (ref <150)

## 2022-01-07 NOTE — Assessment & Plan Note (Signed)
Patient had good aspiration done today.  Discussed icing regimen and home exercises, which activities to do which wants to avoid.  Increase activity slowly.  Patient likely will need unfortunately a replacement in the next coming years.  Patient though could be a potential candidate for viscosupplementation and we will get him approval.  Patient would like to avoid any surgery until after the season. ?

## 2022-01-12 ENCOUNTER — Ambulatory Visit: Payer: 59 | Admitting: Family Medicine

## 2022-01-14 ENCOUNTER — Other Ambulatory Visit: Payer: Self-pay | Admitting: Physician Assistant

## 2022-01-17 ENCOUNTER — Ambulatory Visit: Payer: 59 | Admitting: Psychology

## 2022-01-21 ENCOUNTER — Ambulatory Visit (INDEPENDENT_AMBULATORY_CARE_PROVIDER_SITE_OTHER): Payer: 59 | Admitting: Psychology

## 2022-01-21 ENCOUNTER — Encounter: Payer: Self-pay | Admitting: Family Medicine

## 2022-01-21 DIAGNOSIS — F4322 Adjustment disorder with anxiety: Secondary | ICD-10-CM | POA: Diagnosis not present
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
## 2022-01-26 ENCOUNTER — Ambulatory Visit: Payer: 59 | Admitting: Family Medicine

## 2022-01-31 ENCOUNTER — Ambulatory Visit (INDEPENDENT_AMBULATORY_CARE_PROVIDER_SITE_OTHER): Payer: 59 | Admitting: Psychology

## 2022-01-31 DIAGNOSIS — F4322 Adjustment disorder with anxiety: Secondary | ICD-10-CM

## 2022-01-31 NOTE — Progress Notes (Signed)
[facility] Counselor/Therapist Progress Note ? ?Patient ID: Matthew [name], MRN: [id number]  ? ?Date: 01/31/22 ? ?Time Spent: 11:06  am - 12:02 am : 56 Minutes ? ?Treatment Type: Individual Therapy. ? ?Reported Symptoms: Anxiety ? ?Mental Status Exam: ?Appearance:  Neat and Well Groomed     ?Behavior: Appropriate  ?Motor: Normal  ?Speech/Language:  Clear and Coherent  ?Affect: Congruent  ?Mood: normal  ?Thought process: normal  ?Thought content:   WNL  ?Sensory/Perceptual disturbances:   WNL  ?Orientation: oriented to person, place, time/date, and situation  ?Attention: Good  ?Concentration: Good  ?Memory: WNL  ?Fund of knowledge:  Good  ?Insight:   Good  ?Judgment:  Good  ?Impulse Control: Good  ? ?Risk Assessment: ?Danger to Self:  No ?Self-injurious Behavior: No ?Danger to Others: No ?Duty to Warn:no ?Physical Aggression / Violence:No  ?Access to Firearms a concern: No  ?Gang Involvement:No  ? ?Subjective:  ? ?Matthew [name] participated from office, via video and consented to treatment. Therapist participated from home office. We met online due to [place] pandemic. Brace reviewed the events of the past week. We reviewed numerous treatment approaches including CBT, BA, Problem Solving, and Solution focused therapy. Psych-education regarding the Matthew [name]'s diagnosis of Adjustment disorder with anxiety was provided during the session. We discussed Matthew [name]'s goals treatment goals which include being challenged in therapy, work on managing symptoms, understanding past and current life stressors, and calling "bullshit" on me when I'm "dancing" around. Matthew [name] noted difficulty becoming introspective.  Matthew [name] provided verbal approval of the treatment plan. Matthew [name] noted journaling, in the past, and noted this being discovered by his wife, which created issues. Matthew [name] noted mediation, as well, but noted having difficulty with finding time for these interventions. Barriers include creating time  and schedules.  Matthew [name] discussed surface validation being important to him including how he looks, if people find him attractive, etc. He noted his mother leaving his father, abruptly, and had a lover then left her lover. He discussed childhood upheaval due to family history. He discussed how his father deal with relationships and sex, with women. He noted his father's marriage devolving and his father attempting suicide after the fact. We will work on processing this going forward. We scheduled a follow-up for tx.  ? ?Interventions:  ? ?Diagnosis:  ?Adjustment disorder with anxiety ? ?Treatment Plan: ? ?Client Abilities/Strengths ?Matthew [name] is intelligent, forthcoming, and motivated for change.  ? ?Support System: ?Family and friends.  ? ?Client Treatment Preferences ?Outpatient Therapy.   ? ?Client Statement of Needs ?Matthew [name] would like to be challenged in therapy, work on managing symptoms, understand past and current life stressors, and call "bullshit" on me when I'm "dancing" around. Matthew [name] noted difficulty becoming introspective.  ? ?Treatment Level ?Weekly ? ?Symptoms ? ?Anxiety: irritability, restlessness, trouble relaxing (Status: maintained) ? ? ?Goals:  ? ?Hasnain experiences symptoms of anxiety with some depression.  ? ? ?Target Date: 4/3/204 Frequency: Weekly  ?Progress: 0 Modality: individual  ? ? ?Therapist will provide referrals for additional resources as appropriate.  ?Therapist will provide psycho-education regarding Daelen's diagnosis and corresponding treatment approaches and interventions. ?Licensed Clinical Social Worker, Buena Irish, LCSW will support the patient's ability to achieve the goals identified. will employ CBT, BA, Problem-solving, Solution Focused, Mindfulness,  coping skills, & other evidenced-based practices will be used to promote progress towards healthy functioning to help manage decrease symptoms associated with his diagnosis.  ? Reduce overall level, frequency, and intensity of the  feelings  of anxiety evidenced by decreased overall symptoms from 6 to 7 days/week to 0 to 1 days/week per client report for at least 3 consecutive months. ?Verbally express understanding of the relationship between feelings of depression, anxiety and their impact on thinking patterns and behaviors. ?Verbalize an understanding of the role that distorted thinking plays in creating fears, excessive worry, and ruminations. ? ? ?Matthew [name] participated in the creation of the treatment plan) ? ?Buena Irish, LCSW ? ?

## 2022-02-01 ENCOUNTER — Ambulatory Visit: Payer: 59 | Admitting: Family Medicine

## 2022-02-02 NOTE — Progress Notes (Signed)
?[name] D.O. ?[facility] ?Matthew [name] ?Phone: [phone] ?Subjective:   ?I, Matthew [name], am serving as a scribe for Dr. Hulan Saas. ?This visit occurred during the SARS-CoV-2 public health emergency.  Safety protocols were in place, including screening questions prior to the visit, additional usage of staff PPE, and extensive cleaning of exam room while observing appropriate contact time as indicated for disinfecting solutions.  ?I'm seeing this patient by the request  of:  [name], MD ? ?CC: Left shoulder pain follow-up ? ?[id number]  ?01/06/2022 ?Patient had good aspiration done today.  Discussed icing regimen and home exercises, which activities to do which wants to avoid.  Increase activity slowly.  Patient likely will need unfortunately a replacement in the next coming years.  Patient though could be a potential candidate for viscosupplementation and we will get him approval.  Patient would like to avoid any surgery until after the season. ? ?Update 02/03/2022 ?Matthew [name] is a 66 y.o. male coming in with complaint of R knee pain. Patient states that his pain is not as bad. Would like to know if he should get gel today.  ? ?L shoulder pain especially at night when sleeping. Was hitting golf balls and noted pain in L shoulder over middle deltoid that persists with workouts and golf.  ? ?Pain in R hand middle finger with flexion. Feels that he has arthritis.  ? ? ? ? ? ?Past Medical History:  ?Diagnosis Date  ? Allergy   ? RHINITIS  ? Anemia   ? Arthritis   ? Bursitis of left foot 11/11/2016  ? Colonic polyp 09/2006  ? colonoscopy  ? Ganglion 11/02/2017  ? GERD (gastroesophageal reflux disease)   ? Golfer's elbow, right 09/25/2019  ? Gout   ? Heart beat abnormality 12/14/2017  ? Heart murmur   ? Herpes genitalis in men 02/14/2014  ? Hypertension   ? Osteitis pubis ([place]) 01/27/2016  ? Primary osteoarthritis of left knee 11/06/2017  ? Right groin pain 11/08/2018   ? SOB (shortness of breath)   ? per pt, may be coming from new medication  ? Subluxation of distal radial-ulnar joint 12/03/2019  ? Subluxation of tendon of long head of biceps 10/15/2016  ? Injected shoulder 12/19/2016  ? Subungual hematoma of foot, initial encounter 05/29/2018  ? SVT (supraventricular tachycardia) ([place]) 03/09/2020  ? ?Past Surgical History:  ?Procedure Laterality Date  ? COLONOSCOPY  09/2006  ? KNEE ARTHROSCOPY Left 2011  ? KNEE SURGERY Left 08/2018  ? scar tissue surgery  ? [place] [facility]GERY  1990  ? SVT ABLATION N/A 01/09/2020  ? Procedure: SVT ABLATION;  Surgeon: Constance Haw, MD;  Location: [facility];  Service: Cardiovascular;  Laterality: N/A;  ? TOTAL HIP ARTHROPLASTY Left 07/20/2021  ? Procedure: LEFT TOTAL HIP ARTHROPLASTY ANTERIOR APPROACH;  Surgeon: Melrose Nakayama, MD;  Location: [facility];  Service: Orthopedics;  Laterality: Left;  ? TOTAL KNEE ARTHROPLASTY Left 11/06/2017  ? Procedure: TOTAL KNEE ARTHROPLASTY;  Surgeon: Dorna Leitz, MD;  Location: [facility];  Service: Orthopedics;  Laterality: Left;  ? ?Social History  ? ?Socioeconomic History  ? Marital status: Married  ?  Spouse name: Not on file  ? Number of children: 1  ? Years of education: Not on file  ? Highest education level: Not on file  ?Occupational History  ? Not on file  ?Tobacco Use  ? Smoking status: Some Days  ?  Types: Cigars  ?  Smokeless tobacco: Never  ? Tobacco comments:  ?  occasional cigar  ?Vaping Use  ? Vaping Use: Never used  ?Substance and Sexual Activity  ? Alcohol use: Yes  ?  Alcohol/week: 3.0 standard drinks  ?  Types: 3 Standard drinks or equivalent per week  ?  Comment: daily  ? Drug use: No  ? Sexual activity: Yes  ?Other Topics Concern  ? Not on file  ?Social History Narrative  ? Not on file  ? ?Social Determinants of Health  ? ?Financial Resource Strain: Not on file  ?Food Insecurity: Not on file  ?Transportation Needs: Not on file  ?Physical Activity: Not on file  ?Stress: Not on file   ?Social Connections: Not on file  ? ?Allergies  ?Allergen Reactions  ? Beta Adrenergic Blockers Other (See Comments)  ?  Fatigue  ? Lisinopril Cough  ? Asa [Aspirin] Rash  ?  High dosage ASA causes break out  ? Demerol Itching  ? ?Family History  ?Problem Relation Age of Onset  ? Stroke Father   ? Gout Father   ? Depression Brother   ? Dermatomyositis Daughter   ? Colon cancer Neg Hx   ? Rectal cancer Neg Hx   ? Esophageal cancer Neg Hx   ? Pancreatic cancer Neg Hx   ? Stomach cancer Neg Hx   ? Liver disease Neg Hx   ? ? ? ?Current Outpatient Medications (Cardiovascular):  ?  diltiazem (CARDIZEM CD) 360 MG 24 hr capsule, TAKE 1 CAPSULE BY MOUTH EVERY DAY ?  flecainide (TAMBOCOR) 50 MG tablet, Take 50 mg by mouth 2 (two) times daily. ?  losartan-hydrochlorothiazide (HYZAAR) 50-12.5 MG tablet, TAKE 1 TABLET BY MOUTH EVERY DAY ?  rosuvastatin (CRESTOR) 20 MG tablet, TAKE 1 TABLET BY MOUTH EVERY DAY ? ?Current Outpatient Medications (Respiratory):  ?  fexofenadine (ALLEGRA) 180 MG tablet, Take 180 mg by mouth at bedtime.  ?  fluticasone (FLONASE) 50 MCG/ACT nasal spray, Place into the nose. ?  montelukast (SINGULAIR) 10 MG tablet, TAKE 1 TABLET BY MOUTH EVERYDAY AT BEDTIME ? ?Current Outpatient Medications (Analgesics):  ?  allopurinol (ZYLOPRIM) 300 MG tablet, TAKE 1 TABLET BY MOUTH EVERY DAY ?  aspirin EC 81 MG tablet, Take 1 tablet (81 mg total) by mouth 2 (two) times daily after a meal. ?  meloxicam (MOBIC) 15 MG tablet, TAKE 1 TABLET BY MOUTH EVERY DAY ?  HYDROcodone-acetaminophen (NORCO/VICODIN) 5-325 MG tablet, Take 1-2 tablets by mouth every 6 (six) hours as needed for moderate pain (post op pain). ? ?Current Outpatient Medications (Hematological):  ?  Ferrous Sulfate (IRON) 325 (65 FE) MG TABS, Take 325 mg by mouth daily.  ? ?Current Outpatient Medications (Other):  ?  cholecalciferol (VITAMIN D) 1000 units tablet, Take 1,000 Units by mouth daily. ?  dicyclomine (BENTYL) 10 MG/5ML solution,  ?  esomeprazole  (NEXIUM) 40 MG capsule, TAKE 1 CAPSULE (40 MG TOTAL) BY MOUTH 2 (TWO) TIMES DAILY BEFORE A MEAL. TAKE ONE TABLET 30-60 MINUTES BEFORE BREAKFAST ?  gabapentin (NEURONTIN) 100 MG capsule, Take 4 capsules (400 mg total) by mouth at bedtime. ?  Misc Natural Products (TART CHERRY ADVANCED PO), Take 3,600 mg by mouth daily. ?  Multiple Vitamin (MULTIVITAMIN) tablet, Take 1 tablet by mouth daily. ?  TART CHERRY PO, Take by mouth. ?  triamcinolone (KENALOG) 0.1 % paste, SMARTSIG:1 TO TEETH Every Night ?  valACYclovir (VALTREX) 500 MG tablet, Take 500 mg by mouth daily. ?  famotidine (PEPCID) 40 MG  tablet, TAKE 1 TABLET BY MOUTH 2 TIMES DAILY. TAKE 1 TABLET EVERY MORNING AND EVERY NIGHT ?  sucralfate (CARAFATE) 1 g tablet, Take 1 tablet (1 g total) by mouth 4 (four) times daily. Take one tablet 30-60 minutes before breakfast. ?  tiZANidine (ZANAFLEX) 4 MG tablet, Take 1 tablet (4 mg total) by mouth every 6 (six) hours as needed for muscle spasms. ? ? ?Reviewed prior external information including notes and imaging from  ?primary care provider ?As well as notes that were available from care everywhere and other healthcare systems. ? ?Past medical history, social, surgical and family history all reviewed in electronic medical record.  No pertanent information unless stated regarding to the chief complaint.  ? ?Review of Systems: ? No headache, visual changes, nausea, vomiting, diarrhea, constipation, dizziness, abdominal pain, skin rash, fevers, chills, night sweats, weight loss, swollen lymph nodes, body aches, joint swelling, chest pain, shortness of breath, mood changes. POSITIVE muscle aches ? ?Objective  ?Blood pressure 126/82, pulse (!) 59, height 6' (1.829 m), weight 196 lb (88.9 kg), SpO2 96 %. ?  ?General: No apparent distress alert and oriented x3 mood and affect normal, dressed appropriately.  ?HEENT: Pupils equal, extraocular movements intact  ?Respiratory: Patient's speak in full sentences and does not appear  short of breath  ?Cardiovascular: No lower extremity edema, non tender, no erythema  ?Gait normal with good balance and coordination.  ?MSK: Left shoulder exam does have some tenderness to palpation over the acrom

## 2022-02-03 ENCOUNTER — Ambulatory Visit: Payer: Self-pay

## 2022-02-03 ENCOUNTER — Encounter: Payer: Self-pay | Admitting: Family Medicine

## 2022-02-03 ENCOUNTER — Ambulatory Visit: Payer: 59 | Admitting: Family Medicine

## 2022-02-03 VITALS — BP 126/82 | HR 59 | Ht 72.0 in | Wt 196.0 lb

## 2022-02-03 DIAGNOSIS — M1711 Unilateral primary osteoarthritis, right knee: Secondary | ICD-10-CM

## 2022-02-03 DIAGNOSIS — M65331 Trigger finger, right middle finger: Secondary | ICD-10-CM

## 2022-02-03 DIAGNOSIS — M25512 Pain in left shoulder: Secondary | ICD-10-CM

## 2022-02-03 DIAGNOSIS — M79641 Pain in right hand: Secondary | ICD-10-CM | POA: Diagnosis not present

## 2022-02-03 DIAGNOSIS — M19019 Primary osteoarthritis, unspecified shoulder: Secondary | ICD-10-CM | POA: Insufficient documentation

## 2022-02-03 DIAGNOSIS — M25561 Pain in right knee: Secondary | ICD-10-CM | POA: Diagnosis not present

## 2022-02-03 DIAGNOSIS — M19011 Primary osteoarthritis, right shoulder: Secondary | ICD-10-CM | POA: Diagnosis not present

## 2022-02-03 NOTE — Assessment & Plan Note (Signed)
We will continue to monitor.  Been 1 year since injection.  If worsening again consider injection. ?

## 2022-02-03 NOTE — Patient Instructions (Addendum)
Xray today ?Injected shoulder in 2 spots ? ?

## 2022-02-03 NOTE — Assessment & Plan Note (Signed)
Chronic problem with exacerbation.  Has had tendinitis previously in the shoulder.  Seems to have a very similar presentation.  Also has arthritic changes of the acromioclavicular joint.  This consistent with the MRI patient had last year.  Would hold on any type of surgical intervention and hopefully the injections do make a significant difference.  Encourage patient to do the home exercises and follow-up again in 6 to 8 weeks ?

## 2022-02-03 NOTE — Assessment & Plan Note (Signed)
Known arthritic changes, likely no significant swelling at the moment but I do think that patient would be a good candidate for viscosupplementation and we will get approval for this. ?

## 2022-02-03 NOTE — Assessment & Plan Note (Signed)
Injection given today and tolerated the procedure well, discussed icing regimen and home exercises, which activities to do and which ones to avoid.  On ultrasound does appear that it has worsened over the course of time.  Will monitor closely. ?

## 2022-02-14 ENCOUNTER — Ambulatory Visit: Payer: Self-pay

## 2022-02-14 ENCOUNTER — Ambulatory Visit (INDEPENDENT_AMBULATORY_CARE_PROVIDER_SITE_OTHER): Payer: 59 | Admitting: Psychology

## 2022-02-14 ENCOUNTER — Encounter: Payer: Self-pay | Admitting: Family Medicine

## 2022-02-14 ENCOUNTER — Ambulatory Visit (INDEPENDENT_AMBULATORY_CARE_PROVIDER_SITE_OTHER): Payer: 59 | Admitting: Family Medicine

## 2022-02-14 ENCOUNTER — Other Ambulatory Visit: Payer: Self-pay | Admitting: Cardiology

## 2022-02-14 VITALS — Ht 72.0 in | Wt 195.0 lb

## 2022-02-14 DIAGNOSIS — F4322 Adjustment disorder with anxiety: Secondary | ICD-10-CM

## 2022-02-14 DIAGNOSIS — M1711 Unilateral primary osteoarthritis, right knee: Secondary | ICD-10-CM | POA: Diagnosis not present

## 2022-02-14 DIAGNOSIS — M25561 Pain in right knee: Secondary | ICD-10-CM

## 2022-02-14 DIAGNOSIS — G8929 Other chronic pain: Secondary | ICD-10-CM | POA: Diagnosis not present

## 2022-02-14 NOTE — Assessment & Plan Note (Signed)
Significant effusion noted today.  Does have known arthritic changes.  Patient does think that she will do a replacement in the near future.  Follow-up with me again in 4 weeks and may need a steroid injection if necessary. ?

## 2022-02-14 NOTE — Progress Notes (Signed)
?Matthew [name] D.O. ?[facility] ?[place] ?Phone: [phone] ?Subjective:   ?I, Matthew [name], am serving as a scribe for Dr. Hulan Saas. ? ?This visit occurred during the SARS-CoV-2 public health emergency.  Safety protocols were in place, including screening questions prior to the visit, additional usage of staff PPE, and extensive cleaning of exam room while observing appropriate contact time as indicated for disinfecting solutions.  ?I'm seeing this patient by the request  of:  [name], MD ? ?CC:  ? ?[id number]  ?Matthew [name] is a 66 y.o. male coming in with complaint of R knee pain. Patient states that he stepped out of golf cart on Thursday and felt a pop over medial aspect.  ? ? ? ?  ? ?Past Medical History:  ?Diagnosis Date  ? Allergy   ? RHINITIS  ? Anemia   ? Arthritis   ? Bursitis of left foot 11/11/2016  ? Colonic polyp 09/2006  ? colonoscopy  ? Ganglion 11/02/2017  ? GERD (gastroesophageal reflux disease)   ? Golfer's elbow, right 09/25/2019  ? Gout   ? Heart beat abnormality 12/14/2017  ? Heart murmur   ? Herpes genitalis in men 02/14/2014  ? Hypertension   ? Osteitis pubis ([place]) 01/27/2016  ? Primary osteoarthritis of left knee 11/06/2017  ? Right groin pain 11/08/2018  ? SOB (shortness of breath)   ? per pt, may be coming from new medication  ? Subluxation of distal radial-ulnar joint 12/03/2019  ? Subluxation of tendon of long head of biceps 10/15/2016  ? Injected shoulder 12/19/2016  ? Subungual hematoma of foot, initial encounter 05/29/2018  ? SVT (supraventricular tachycardia) ([place]) 03/09/2020  ? ?Past Surgical History:  ?Procedure Laterality Date  ? COLONOSCOPY  09/2006  ? KNEE ARTHROSCOPY Left 2011  ? KNEE SURGERY Left 08/2018  ? scar tissue surgery  ? [place] SURGERY  1990  ? SVT ABLATION N/A 01/09/2020  ? Procedure: SVT ABLATION;  Surgeon: Constance Haw, MD;  Location: [facility];  Service: Cardiovascular;  Laterality: N/A;  ?  TOTAL HIP ARTHROPLASTY Left 07/20/2021  ? Procedure: LEFT TOTAL HIP ARTHROPLASTY ANTERIOR APPROACH;  Surgeon: Melrose Nakayama, MD;  Location: [facility];  Service: Orthopedics;  Laterality: Left;  ? TOTAL KNEE ARTHROPLASTY Left 11/06/2017  ? Procedure: TOTAL KNEE ARTHROPLASTY;  Surgeon: Dorna Leitz, MD;  Location: [facility];  Service: Orthopedics;  Laterality: Left;  ? ?Social History  ? ?Socioeconomic History  ? Marital status: Married  ?  Spouse name: Not on file  ? Number of children: 1  ? Years of education: Not on file  ? Highest education level: Not on file  ?Occupational History  ? Not on file  ?Tobacco Use  ? Smoking status: Some Days  ?  Types: Cigars  ? Smokeless tobacco: Never  ? Tobacco comments:  ?  occasional cigar  ?Vaping Use  ? Vaping Use: Never used  ?Substance and Sexual Activity  ? Alcohol use: Yes  ?  Alcohol/week: 3.0 standard drinks  ?  Types: 3 Standard drinks or equivalent per week  ?  Comment: daily  ? Drug use: No  ? Sexual activity: Yes  ?Other Topics Concern  ? Not on file  ?Social History Narrative  ? Not on file  ? ?Social Determinants of Health  ? ?Financial Resource Strain: Not on file  ?Food Insecurity: Not on file  ?Transportation Needs: Not on file  ?Physical Activity: Not on file  ?  Stress: Not on file  ?Social Connections: Not on file  ? ?Allergies  ?Allergen Reactions  ? Beta Adrenergic Blockers Other (See Comments)  ?  Fatigue  ? Lisinopril Cough  ? Asa [Aspirin] Rash  ?  High dosage ASA causes break out  ? Demerol Itching  ? ?Family History  ?Problem Relation Age of Onset  ? Stroke Father   ? Gout Father   ? Depression Brother   ? Dermatomyositis Daughter   ? Colon cancer Neg Hx   ? Rectal cancer Neg Hx   ? Esophageal cancer Neg Hx   ? Pancreatic cancer Neg Hx   ? Stomach cancer Neg Hx   ? Liver disease Neg Hx   ? ? ? ?Current Outpatient Medications (Cardiovascular):  ?  diltiazem (CARDIZEM CD) 360 MG 24 hr capsule, TAKE 1 CAPSULE BY MOUTH EVERY DAY ?  flecainide (TAMBOCOR) 50 MG  tablet, Take 50 mg by mouth 2 (two) times daily. ?  losartan-hydrochlorothiazide (HYZAAR) 50-12.5 MG tablet, TAKE 1 TABLET BY MOUTH EVERY DAY ?  rosuvastatin (CRESTOR) 20 MG tablet, TAKE 1 TABLET BY MOUTH EVERY DAY ? ?Current Outpatient Medications (Respiratory):  ?  fexofenadine (ALLEGRA) 180 MG tablet, Take 180 mg by mouth at bedtime.  ?  fluticasone (FLONASE) 50 MCG/ACT nasal spray, Place into the nose. ?  montelukast (SINGULAIR) 10 MG tablet, TAKE 1 TABLET BY MOUTH EVERYDAY AT BEDTIME ? ?Current Outpatient Medications (Analgesics):  ?  allopurinol (ZYLOPRIM) 300 MG tablet, TAKE 1 TABLET BY MOUTH EVERY DAY ?  aspirin EC 81 MG tablet, Take 1 tablet (81 mg total) by mouth 2 (two) times daily after a meal. ?  HYDROcodone-acetaminophen (NORCO/VICODIN) 5-325 MG tablet, Take 1-2 tablets by mouth every 6 (six) hours as needed for moderate pain (post op pain). ?  meloxicam (MOBIC) 15 MG tablet, TAKE 1 TABLET BY MOUTH EVERY DAY ? ?Current Outpatient Medications (Hematological):  ?  Ferrous Sulfate (IRON) 325 (65 FE) MG TABS, Take 325 mg by mouth daily.  ? ?Current Outpatient Medications (Other):  ?  cholecalciferol (VITAMIN D) 1000 units tablet, Take 1,000 Units by mouth daily. ?  dicyclomine (BENTYL) 10 MG/5ML solution,  ?  esomeprazole (NEXIUM) 40 MG capsule, TAKE 1 CAPSULE (40 MG TOTAL) BY MOUTH 2 (TWO) TIMES DAILY BEFORE A MEAL. TAKE ONE TABLET 30-60 MINUTES BEFORE BREAKFAST ?  famotidine (PEPCID) 40 MG tablet, TAKE 1 TABLET BY MOUTH 2 TIMES DAILY. TAKE 1 TABLET EVERY MORNING AND EVERY NIGHT ?  gabapentin (NEURONTIN) 100 MG capsule, Take 4 capsules (400 mg total) by mouth at bedtime. ?  Misc Natural Products (TART CHERRY ADVANCED PO), Take 3,600 mg by mouth daily. ?  Multiple Vitamin (MULTIVITAMIN) tablet, Take 1 tablet by mouth daily. ?  sucralfate (CARAFATE) 1 g tablet, Take 1 tablet (1 g total) by mouth 4 (four) times daily. Take one tablet 30-60 minutes before breakfast. ?  TART CHERRY PO, Take by mouth. ?   tiZANidine (ZANAFLEX) 4 MG tablet, Take 1 tablet (4 mg total) by mouth every 6 (six) hours as needed for muscle spasms. ?  triamcinolone (KENALOG) 0.1 % paste, SMARTSIG:1 TO TEETH Every Night ?  valACYclovir (VALTREX) 500 MG tablet, Take 500 mg by mouth daily. ? ? ?Reviewed prior external information including notes and imaging from  ?primary care provider ?As well as notes that were available from care everywhere and other healthcare systems. ? ?Past medical history, social, surgical and family history all reviewed in electronic medical record.  No pertanent information  unless stated regarding to the chief complaint.  ? ?Review of Systems: ? No headache, visual changes, nausea, vomiting, diarrhea, constipation, dizziness, abdominal pain, skin rash, fevers, chills, night sweats, weight loss, swollen lymph nodes, body aches, joint swelling, chest pain, shortness of breath, mood changes. POSITIVE muscle aches ? ?Objective  ?There were no vitals taken for this visit. ?  ?General: No apparent distress alert and oriented x3 mood and affect normal, dressed appropriately.  ?HEENT: Pupils equal, extraocular movements intact  ?Respiratory: Patient's speak in full sentences and does not appear short of breath  ?Cardiovascular: No lower extremity edema, non tender, no erythema  ?Gait normal with good balance and coordination.  ?MSK:  Non tender with full range of motion and good stability and symmetric strength and tone of shoulders, elbows, wrist, hip, knee and ankles bilaterally.  ? ?  ?Impression and Recommendations:  ?  ? ?The above documentation has been reviewed and is accurate and complete Matthew [name] ? ? ?

## 2022-02-14 NOTE — Patient Instructions (Signed)
Good to see you ?Make appt in 4 weeks ok to double book ?
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
## 2022-02-14 NOTE — Progress Notes (Signed)
?[name] D.O. ?[facility] ?[place] ?Phone: [phone] ?Subjective:   ? ?I'm seeing this patient by the request  of:  [name], MD ? ?CC: Right knee pain and swelling ? ?[id number]  ?Matthew [name] is a 66 y.o. male coming in with complaint of right knee pain and swelling patient does have some instability noted as well but nothing severe.  Patient did to have it when he was making a certain movement while playing golf ? ? ? ?  ? ?Past Medical History:  ?Diagnosis Date  ? Allergy   ? RHINITIS  ? Anemia   ? Arthritis   ? Bursitis of left foot 11/11/2016  ? Colonic polyp 09/2006  ? colonoscopy  ? Ganglion 11/02/2017  ? GERD (gastroesophageal reflux disease)   ? Golfer's elbow, right 09/25/2019  ? Gout   ? Heart beat abnormality 12/14/2017  ? Heart murmur   ? Herpes genitalis in men 02/14/2014  ? Hypertension   ? Osteitis pubis ([place]) 01/27/2016  ? Primary osteoarthritis of left knee 11/06/2017  ? Right groin pain 11/08/2018  ? SOB (shortness of breath)   ? per pt, may be coming from new medication  ? Subluxation of distal radial-ulnar joint 12/03/2019  ? Subluxation of tendon of long head of biceps 10/15/2016  ? Injected shoulder 12/19/2016  ? Subungual hematoma of foot, initial encounter 05/29/2018  ? SVT (supraventricular tachycardia) ([place]) 03/09/2020  ? ?Past Surgical History:  ?Procedure Laterality Date  ? COLONOSCOPY  09/2006  ? KNEE ARTHROSCOPY Left 2011  ? KNEE SURGERY Left 08/2018  ? scar tissue surgery  ? [place] SURGERY  1990  ? SVT ABLATION N/A 01/09/2020  ? Procedure: SVT ABLATION;  Surgeon: Constance Haw, MD;  Location: [facility];  Service: Cardiovascular;  Laterality: N/A;  ? TOTAL HIP ARTHROPLASTY Left 07/20/2021  ? Procedure: LEFT TOTAL HIP ARTHROPLASTY ANTERIOR APPROACH;  Surgeon: Melrose Nakayama, MD;  Location: [facility];  Service: Orthopedics;  Laterality: Left;  ? TOTAL KNEE ARTHROPLASTY Left 11/06/2017  ? Procedure: TOTAL KNEE  ARTHROPLASTY;  Surgeon: Dorna Leitz, MD;  Location: [facility];  Service: Orthopedics;  Laterality: Left;  ? ?Social History  ? ?Socioeconomic History  ? Marital status: Married  ?  Spouse name: Not on file  ? Number of children: 1  ? Years of education: Not on file  ? Highest education level: Not on file  ?Occupational History  ? Not on file  ?Tobacco Use  ? Smoking status: Some Days  ?  Types: Cigars  ? Smokeless tobacco: Never  ? Tobacco comments:  ?  occasional cigar  ?Vaping Use  ? Vaping Use: Never used  ?Substance and Sexual Activity  ? Alcohol use: Yes  ?  Alcohol/week: 3.0 standard drinks  ?  Types: 3 Standard drinks or equivalent per week  ?  Comment: daily  ? Drug use: No  ? Sexual activity: Yes  ?Other Topics Concern  ? Not on file  ?Social History Narrative  ? Not on file  ? ?Social Determinants of Health  ? ?Financial Resource Strain: Not on file  ?Food Insecurity: Not on file  ?Transportation Needs: Not on file  ?Physical Activity: Not on file  ?Stress: Not on file  ?Social Connections: Not on file  ? ?Allergies  ?Allergen Reactions  ? Beta Adrenergic Blockers Other (See Comments)  ?  Fatigue  ? Lisinopril Cough  ? Asa [Aspirin] Rash  ?  High  dosage ASA causes break out  ? Demerol Itching  ? ?Family History  ?Problem Relation Age of Onset  ? Stroke Father   ? Gout Father   ? Depression Brother   ? Dermatomyositis Daughter   ? Colon cancer Neg Hx   ? Rectal cancer Neg Hx   ? Esophageal cancer Neg Hx   ? Pancreatic cancer Neg Hx   ? Stomach cancer Neg Hx   ? Liver disease Neg Hx   ? ? ? ?Current Outpatient Medications (Cardiovascular):  ?  diltiazem (CARDIZEM CD) 360 MG 24 hr capsule, TAKE 1 CAPSULE BY MOUTH EVERY DAY ?  flecainide (TAMBOCOR) 50 MG tablet, Take 50 mg by mouth 2 (two) times daily. ?  losartan-hydrochlorothiazide (HYZAAR) 50-12.5 MG tablet, TAKE 1 TABLET BY MOUTH EVERY DAY ?  rosuvastatin (CRESTOR) 20 MG tablet, TAKE 1 TABLET BY MOUTH EVERY DAY ? ?Current Outpatient Medications (Respiratory):   ?  fexofenadine (ALLEGRA) 180 MG tablet, Take 180 mg by mouth at bedtime.  ?  fluticasone (FLONASE) 50 MCG/ACT nasal spray, Place into the nose. ?  montelukast (SINGULAIR) 10 MG tablet, TAKE 1 TABLET BY MOUTH EVERYDAY AT BEDTIME ? ?Current Outpatient Medications (Analgesics):  ?  allopurinol (ZYLOPRIM) 300 MG tablet, TAKE 1 TABLET BY MOUTH EVERY DAY ?  aspirin EC 81 MG tablet, Take 1 tablet (81 mg total) by mouth 2 (two) times daily after a meal. ?  meloxicam (MOBIC) 15 MG tablet, TAKE 1 TABLET BY MOUTH EVERY DAY ?  HYDROcodone-acetaminophen (NORCO/VICODIN) 5-325 MG tablet, Take 1-2 tablets by mouth every 6 (six) hours as needed for moderate pain (post op pain). ? ?Current Outpatient Medications (Hematological):  ?  Ferrous Sulfate (IRON) 325 (65 FE) MG TABS, Take 325 mg by mouth daily.  ? ?Current Outpatient Medications (Other):  ?  cholecalciferol (VITAMIN D) 1000 units tablet, Take 1,000 Units by mouth daily. ?  dicyclomine (BENTYL) 10 MG/5ML solution,  ?  esomeprazole (NEXIUM) 40 MG capsule, TAKE 1 CAPSULE (40 MG TOTAL) BY MOUTH 2 (TWO) TIMES DAILY BEFORE A MEAL. TAKE ONE TABLET 30-60 MINUTES BEFORE BREAKFAST ?  gabapentin (NEURONTIN) 100 MG capsule, Take 4 capsules (400 mg total) by mouth at bedtime. ?  Misc Natural Products (TART CHERRY ADVANCED PO), Take 3,600 mg by mouth daily. ?  Multiple Vitamin (MULTIVITAMIN) tablet, Take 1 tablet by mouth daily. ?  TART CHERRY PO, Take by mouth. ?  triamcinolone (KENALOG) 0.1 % paste, SMARTSIG:1 TO TEETH Every Night ?  valACYclovir (VALTREX) 500 MG tablet, Take 500 mg by mouth daily. ?  famotidine (PEPCID) 40 MG tablet, TAKE 1 TABLET BY MOUTH 2 TIMES DAILY. TAKE 1 TABLET EVERY MORNING AND EVERY NIGHT ?  sucralfate (CARAFATE) 1 g tablet, Take 1 tablet (1 g total) by mouth 4 (four) times daily. Take one tablet 30-60 minutes before breakfast. ?  tiZANidine (ZANAFLEX) 4 MG tablet, Take 1 tablet (4 mg total) by mouth every 6 (six) hours as needed for muscle  spasms. ? ? ?Reviewed prior external information including notes and imaging from  ?primary care provider ?As well as notes that were available from care everywhere and other healthcare systems. ? ?Past medical history, social, surgical and family history all reviewed in electronic medical record.  No pertanent information unless stated regarding to the chief complaint.  ? ?Review of Systems: ? No headache, visual changes, nausea, vomiting, diarrhea, constipation, dizziness, abdominal pain, skin rash, fevers, chills, night sweats, weight loss, swollen lymph nodes, body aches, chest pain, shortness of  breath, mood changes. POSITIVE muscle aches, joint swelling ? ?Objective  ?Blood pressure (P) 138/82, pulse (P) 64, height 6' (1.829 m), weight 195 lb (88.5 kg). ?  ?General: No apparent distress alert and oriented x3 mood and affect normal, dressed appropriately.  ?HEENT: Pupils equal, extraocular movements intact  ?Respiratory: Patient's speak in full sentences and does not appear short of breath  ?Cardiovascular: No lower extremity edema, non tender, no erythema  ?Gait very minorly antalgic ?Right knee exam does have some swelling noted.  Patient does have some instability noted as well.  Instability noted with valgus and varus force.  Significant effusion noted. ? ?Procedure: Real-time Ultrasound Guided Injection of right knee ?Device: GE Logiq Q7 ?Ultrasound guided injection is preferred based studies that show increased duration, increased effect, greater accuracy, decreased procedural pain, increased response rate, and decreased cost with ultrasound guided versus blind injection.  ?Verbal informed consent obtained.  ?Time-out conducted.  ?Noted no overlying erythema, induration, or other signs of local infection.  ?Skin prepped in a sterile fashion.  ?Local anesthesia: Topical Ethyl chloride.  ?With sterile technique and under real time ultrasound guidance: With a 22-gauge 2 inch needle patient was injected with  4 cc of 0.5% Marcaine and\aspirated 60 cc of straw-colored fluid and then injected 48 mg per 3 mL of Monovisc... This was from a superior lateral approach.  ?Completed without difficulty  ?Pain immediately resolved suggesting ac

## 2022-02-24 ENCOUNTER — Other Ambulatory Visit: Payer: Self-pay

## 2022-02-24 DIAGNOSIS — G8929 Other chronic pain: Secondary | ICD-10-CM

## 2022-03-02 ENCOUNTER — Other Ambulatory Visit: Payer: Self-pay | Admitting: Family Medicine

## 2022-03-03 ENCOUNTER — Ambulatory Visit (INDEPENDENT_AMBULATORY_CARE_PROVIDER_SITE_OTHER): Payer: 59 | Admitting: Psychology

## 2022-03-03 DIAGNOSIS — F4322 Adjustment disorder with anxiety: Secondary | ICD-10-CM
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
## 2022-03-03 NOTE — Progress Notes (Signed)
?Matthew [name] D.O. ?[name] ?Matthew [name] ?Phone: [phone] ?Subjective:   ?I, Matthew [name], am serving as a scribe for Dr. Hulan [name]. ? ?This visit occurred during the SARS-CoV-2 public health emergency.  Safety protocols were in place, including screening questions prior to the visit, additional usage of staff PPE, and extensive cleaning of exam room while observing appropriate contact time as indicated for disinfecting solutions.  ? ? ?I'm seeing this patient by the request  of:  Matthew Rail, MD ? ?CC: Right knee pain and swelling ? ?[id number]  ?02/14/2022 ?Significant effusion noted today.  Does have known arthritic changes.  Patient does think that she will do a replacement in the near future.  Follow-up with me again in 4 weeks and may need a steroid injection if necessary. ? ?Updated 03/07/2022 ?Matthew [name] is a 66 y.o. male coming in with complaint of right knee pain. MRI 03/05/2022. Gel injection did not help as much as he hoped. Stepped out of a golf cart and felt pop over medial aspect.  Patient did have the MRI.  MRI does show the patient has end-stage osteoarthritic changes of the medial compartment patient did have a large joint effusion noted as well.  Degenerative meniscal changes noted as well. ? ? ? ?  ? ?Past Medical History:  ?Diagnosis Date  ? Allergy   ? RHINITIS  ? Anemia   ? Arthritis   ? Bursitis of left foot 11/11/2016  ? Colonic polyp 09/2006  ? colonoscopy  ? Ganglion 11/02/2017  ? GERD (gastroesophageal reflux disease)   ? Golfer's elbow, right 09/25/2019  ? Gout   ? Heart beat abnormality 12/14/2017  ? Heart murmur   ? Herpes genitalis in men 02/14/2014  ? Hypertension   ? Osteitis pubis ([place]) 01/27/2016  ? Primary osteoarthritis of left knee 11/06/2017  ? Right groin pain 11/08/2018  ? SOB (shortness of breath)   ? per pt, may be coming from new medication  ? Subluxation of distal radial-ulnar joint 12/03/2019  ? Subluxation of tendon of long  head of biceps 10/15/2016  ? Injected shoulder 12/19/2016  ? Subungual hematoma of foot, initial encounter 05/29/2018  ? SVT (supraventricular tachycardia) ([place]) 03/09/2020  ? ?Past Surgical History:  ?Procedure Laterality Date  ? COLONOSCOPY  09/2006  ? KNEE ARTHROSCOPY Left 2011  ? KNEE SURGERY Left 08/2018  ? scar tissue surgery  ? [place] SURGERY  1990  ? SVT ABLATION N/A 01/09/2020  ? Procedure: SVT ABLATION;  Surgeon: Constance Haw, MD;  Location: [facility];  Service: Cardiovascular;  Laterality: N/A;  ? TOTAL HIP ARTHROPLASTY Left 07/20/2021  ? Procedure: LEFT TOTAL HIP ARTHROPLASTY ANTERIOR APPROACH;  Surgeon: Melrose Nakayama, MD;  Location: [facility];  Service: Orthopedics;  Laterality: Left;  ? TOTAL KNEE ARTHROPLASTY Left 11/06/2017  ? Procedure: TOTAL KNEE ARTHROPLASTY;  Surgeon: Dorna Leitz, MD;  Location: [facility];  Service: Orthopedics;  Laterality: Left;  ? ?Social History  ? ?Socioeconomic History  ? Marital status: Married  ?  Spouse name: Not on file  ? Number of children: 1  ? Years of education: Not on file  ? Highest education level: Not on file  ?Occupational History  ? Not on file  ?Tobacco Use  ? Smoking status: Some Days  ?  Types: Cigars  ? Smokeless tobacco: Never  ? Tobacco comments:  ?  occasional cigar  ?Vaping Use  ? Vaping Use: Never used  ?  Substance and Sexual Activity  ? Alcohol use: Yes  ?  Alcohol/week: 3.0 standard drinks  ?  Types: 3 Standard drinks or equivalent per week  ?  Comment: daily  ? Drug use: No  ? Sexual activity: Yes  ?Other Topics Concern  ? Not on file  ?Social History Narrative  ? Not on file  ? ?Social Determinants of Health  ? ?Financial Resource Strain: Not on file  ?Food Insecurity: Not on file  ?Transportation Needs: Not on file  ?Physical Activity: Not on file  ?Stress: Not on file  ?Social Connections: Not on file  ? ?Allergies  ?Allergen Reactions  ? Beta Adrenergic Blockers Other (See Comments)  ?  Fatigue  ? Lisinopril Cough  ? Asa [Aspirin]  Rash  ?  High dosage ASA causes break out  ? Demerol Itching  ? ?Family History  ?Problem Relation Age of Onset  ? Stroke Father   ? Gout Father   ? Depression Brother   ? Dermatomyositis Daughter   ? Colon cancer Neg Hx   ? Rectal cancer Neg Hx   ? Esophageal cancer Neg Hx   ? Pancreatic cancer Neg Hx   ? Stomach cancer Neg Hx   ? Liver disease Neg Hx   ? ? ?Current Outpatient Medications (Endocrine & Metabolic):  ?  predniSONE (DELTASONE) 20 MG tablet, Take 1 tablet (20 mg total) by mouth daily with breakfast. ? ?Current Outpatient Medications (Cardiovascular):  ?  diltiazem (CARDIZEM CD) 360 MG 24 hr capsule, TAKE 1 CAPSULE BY MOUTH EVERY DAY ?  flecainide (TAMBOCOR) 50 MG tablet, Take 50 mg by mouth 2 (two) times daily. ?  losartan-hydrochlorothiazide (HYZAAR) 50-12.5 MG tablet, TAKE 1 TABLET BY MOUTH EVERY DAY ?  rosuvastatin (CRESTOR) 20 MG tablet, TAKE 1 TABLET BY MOUTH EVERY DAY ? ?Current Outpatient Medications (Respiratory):  ?  fexofenadine (ALLEGRA) 180 MG tablet, Take 180 mg by mouth at bedtime.  ?  fluticasone (FLONASE) 50 MCG/ACT nasal spray, Place into the nose. ?  montelukast (SINGULAIR) 10 MG tablet, TAKE 1 TABLET BY MOUTH EVERYDAY AT BEDTIME ? ?Current Outpatient Medications (Analgesics):  ?  allopurinol (ZYLOPRIM) 300 MG tablet, TAKE 1 TABLET BY MOUTH EVERY DAY ?  aspirin EC 81 MG tablet, Take 1 tablet (81 mg total) by mouth 2 (two) times daily after a meal. ?  meloxicam (MOBIC) 15 MG tablet, TAKE 1 TABLET BY MOUTH EVERY DAY ?  traMADol (ULTRAM) 50 MG tablet, Take 1 tablet (50 mg total) by mouth every 8 (eight) hours as needed for up to 5 days. ?  HYDROcodone-acetaminophen (NORCO/VICODIN) 5-325 MG tablet, Take 1-2 tablets by mouth every 6 (six) hours as needed for moderate pain (post op pain). ? ?Current Outpatient Medications (Hematological):  ?  Ferrous Sulfate (IRON) 325 (65 FE) MG TABS, Take 325 mg by mouth daily.  ? ?Current Outpatient Medications (Other):  ?  cholecalciferol (VITAMIN D)  1000 units tablet, Take 1,000 Units by mouth daily. ?  dicyclomine (BENTYL) 10 MG/5ML solution,  ?  esomeprazole (NEXIUM) 40 MG capsule, TAKE 1 CAPSULE (40 MG TOTAL) BY MOUTH 2 (TWO) TIMES DAILY BEFORE A MEAL. TAKE ONE TABLET 30-60 MINUTES BEFORE BREAKFAST ?  gabapentin (NEURONTIN) 100 MG capsule, Take 4 capsules (400 mg total) by mouth at bedtime. ?  Misc Natural Products (TART CHERRY ADVANCED PO), Take 3,600 mg by mouth daily. ?  Multiple Vitamin (MULTIVITAMIN) tablet, Take 1 tablet by mouth daily. ?  TART CHERRY PO, Take by mouth. ?  traZODone (DESYREL) 50 MG tablet, Take 0.5-1 tablets (25-50 mg total) by mouth at bedtime as needed for sleep. ?  triamcinolone (KENALOG) 0.1 % paste, SMARTSIG:1 TO TEETH Every Night ?  valACYclovir (VALTREX) 500 MG tablet, Take 500 mg by mouth daily. ?  famotidine (PEPCID) 40 MG tablet, TAKE 1 TABLET BY MOUTH 2 TIMES DAILY. TAKE 1 TABLET EVERY MORNING AND EVERY NIGHT ?  sucralfate (CARAFATE) 1 g tablet, Take 1 tablet (1 g total) by mouth 4 (four) times daily. Take one tablet 30-60 minutes before breakfast. ?  tiZANidine (ZANAFLEX) 4 MG tablet, Take 1 tablet (4 mg total) by mouth every 6 (six) hours as needed for muscle spasms. ? ? ?Reviewed prior external information including notes and imaging from  ?primary care provider ?As well as notes that were available from care everywhere and other healthcare systems. ? ?Past medical history, social, surgical and family history all reviewed in electronic medical record.  No pertanent information unless stated regarding to the chief complaint.  ? ?Review of Systems: ? No headache, visual changes, nausea, vomiting, diarrhea, constipation, dizziness, abdominal pain, skin rash, fevers, chills, night sweats, weight loss, swollen lymph nodes, body aches, joint swelling, chest pain, shortness of breath, mood changes. POSITIVE muscle aches ? ?Objective  ?Blood pressure 130/90, pulse 73, height 6' (1.829 m), weight 192 lb (87.1 kg), SpO2 97 %. ?   ?General: No apparent distress alert and oriented x3 mood and affect normal, dressed appropriately.  ?HEENT: Pupils equal, extraocular movements intact  ?Respiratory: Patient's speak in full sentences and

## 2022-03-05 ENCOUNTER — Ambulatory Visit (INDEPENDENT_AMBULATORY_CARE_PROVIDER_SITE_OTHER): Payer: 59

## 2022-03-05 DIAGNOSIS — G8929 Other chronic pain: Secondary | ICD-10-CM | POA: Diagnosis not present

## 2022-03-05 DIAGNOSIS — M25561 Pain in right knee: Secondary | ICD-10-CM | POA: Diagnosis not present

## 2022-03-07 ENCOUNTER — Encounter: Payer: Self-pay | Admitting: Family Medicine

## 2022-03-07 ENCOUNTER — Ambulatory Visit: Payer: Self-pay

## 2022-03-07 ENCOUNTER — Ambulatory Visit: Payer: 59 | Admitting: Family Medicine

## 2022-03-07 VITALS — BP 130/90 | HR 73 | Ht 72.0 in | Wt 192.0 lb

## 2022-03-07 DIAGNOSIS — M1711 Unilateral primary osteoarthritis, right knee: Secondary | ICD-10-CM

## 2022-03-07 MED ORDER — TRAMADOL HCL 50 MG PO TABS: 50.0000 mg | ORAL_TABLET | Freq: Three times a day (TID) | ORAL | 0 refills | Status: AC | PRN

## 2022-03-07 MED ORDER — PREDNISONE 20 MG PO TABS: 20.0000 mg | ORAL_TABLET | Freq: Every day | ORAL | 0 refills | Status: DC

## 2022-03-07 MED ORDER — TRAZODONE HCL 50 MG PO TABS
25.0000 mg | ORAL_TABLET | Freq: Every evening | ORAL | 3 refills | Status: DC | PRN
Start: 2022-03-07 — End: 2022-03-29

## 2022-03-07 NOTE — Assessment & Plan Note (Signed)
Patient had significant amount of synovial fluid again with more than 73 cc aspirated today.  Given a steroid injection.  MRI shows severe bone-on-bone medial compartment osteoarthritic changes noted. ? ?Patient will need a replacement.  Discussed icing regimen and home otherwise.  Discussed which activities to do and which ones to avoid.  Will refer patient to orthopedic surgery and get him in to discuss surgery in the near future.  Patient was given a short course of prednisone while patient is traveling to [place].  Patient's goal though is to be able to dance at his daughter's wedding in October.  We will forward note to orthopedic surgeon to no obvious pressing the timeline. ?

## 2022-03-07 NOTE — Patient Instructions (Addendum)
Referral to [facility] ?Prednisone '20mg'$  for 5 days ?Compression sleeve for next week ?

## 2022-03-26 ENCOUNTER — Other Ambulatory Visit: Payer: Self-pay | Admitting: Family Medicine

## 2022-03-26 DIAGNOSIS — M109 Gout, unspecified: Secondary | ICD-10-CM

## 2022-03-28 ENCOUNTER — Ambulatory Visit: Payer: 59 | Admitting: Psychology

## 2022-03-29 ENCOUNTER — Other Ambulatory Visit: Payer: Self-pay | Admitting: Family Medicine

## 2022-04-19 ENCOUNTER — Ambulatory Visit (INDEPENDENT_AMBULATORY_CARE_PROVIDER_SITE_OTHER): Payer: 59

## 2022-04-19 ENCOUNTER — Ambulatory Visit (INDEPENDENT_AMBULATORY_CARE_PROVIDER_SITE_OTHER): Payer: 59 | Admitting: Sports Medicine

## 2022-04-19 VITALS — BP 110/82 | HR 63 | Ht 72.0 in | Wt 193.0 lb

## 2022-04-19 DIAGNOSIS — M79671 Pain in right foot: Secondary | ICD-10-CM | POA: Diagnosis not present

## 2022-04-19 DIAGNOSIS — M25571 Pain in right ankle and joints of right foot: Secondary | ICD-10-CM

## 2022-04-19 NOTE — Patient Instructions (Signed)
Good to see you   

## 2022-04-19 NOTE — Progress Notes (Signed)
Matthew [name] D.Matthew [name] Phone: [phone]   Assessment and Plan:     1. Right ankle pain, unspecified chronicity 2. Right foot pain -Acute, uncertain prognosis, initial sports medicine visit - Unclear etiology of medial right-sided ankle pain.  Potentially experiencing a flare of gout based to generalized ankle edema, mild erythema, nonspecific pain without MOI.  Patient used to get flares of gout and first toe, and does not recall history of flare in his ankles.  Patient has been on allopurinol 300 mg daily and tart cherry for 1 year which has reduced his flares of pain - As patient has orthopedic knee replacement scheduled within the next 1 week, he has been recommended to not take NSAIDs.  We will instead use prednisone 20 mg daily x5 days.  Patient already has a prescription of this medication and this has helped him with his pain flares in the past.  Patient states that orthopedic PA told him it would be okay if he took prednisone prior to surgery. - Recommend RICE therapy - X-ray obtained in clinic.  My interpretation: No acute fracture or dislocation.  Mild degenerative changes at talus.  Os trigonum - DG Foot Complete Right; Future    Pertinent previous records reviewed include uric acid within normal limits with last value on 03/09/2020   Follow Up: As needed if no improvement or worsening of symptoms.  Could consider joint aspiration versus uric acid level when patient is not in a flare to see if allopurinol 300 mg is sufficient   Subjective:   I, Matthew [name], am serving as a Education administrator for Doctor Matthew [name]  Chief Complaint: right foot swelling  HPI:   04/19/22 Patient is a 66 year old male complaining of foot swelling. Patient states Sunday his foot started hurting and swelling, has prednisone, no MOI, no numbness or tingling, is going to be getting a knee replacement   Relevant Historical  Information: Chronic right knee pain with plan on knee replacement within the next week, hypertension, GERD, gout  Additional pertinent review of systems negative.   Current Outpatient Medications:    allopurinol (ZYLOPRIM) 300 MG tablet, TAKE 1 TABLET BY MOUTH EVERY DAY, Disp: 90 tablet, Rfl: 1   aspirin EC 81 MG tablet, Take 1 tablet (81 mg total) by mouth 2 (two) times daily after a meal., Disp: 45 tablet, Rfl: 0   cholecalciferol (VITAMIN D) 1000 units tablet, Take 1,000 Units by mouth daily., Disp: , Rfl:    dicyclomine (BENTYL) 10 MG/5ML solution, , Disp: , Rfl:    diltiazem (CARDIZEM CD) 360 MG 24 hr capsule, TAKE 1 CAPSULE BY MOUTH EVERY DAY, Disp: 30 capsule, Rfl: 0   esomeprazole (NEXIUM) 40 MG capsule, TAKE 1 CAPSULE (40 MG TOTAL) BY MOUTH 2 (TWO) TIMES DAILY BEFORE A MEAL. TAKE ONE TABLET 30-60 MINUTES BEFORE BREAKFAST, Disp: 60 capsule, Rfl: 3   fexofenadine (ALLEGRA) 180 MG tablet, Take 180 mg by mouth at bedtime. , Disp: , Rfl:    flecainide (TAMBOCOR) 50 MG tablet, Take 50 mg by mouth 2 (two) times daily., Disp: , Rfl:    fluticasone (FLONASE) 50 MCG/ACT nasal spray, Place into the nose., Disp: , Rfl:    gabapentin (NEURONTIN) 100 MG capsule, Take 4 capsules (400 mg total) by mouth at bedtime., Disp: 360 capsule, Rfl: 3   losartan-hydrochlorothiazide (HYZAAR) 50-12.5 MG tablet, TAKE 1 TABLET BY MOUTH EVERY DAY, Disp: 30 tablet, Rfl: 6  meloxicam (MOBIC) 15 MG tablet, TAKE 1 TABLET BY MOUTH EVERY DAY, Disp: 30 tablet, Rfl: 2   Misc Natural Products (TART CHERRY ADVANCED PO), Take 3,600 mg by mouth daily., Disp: , Rfl:    montelukast (SINGULAIR) 10 MG tablet, TAKE 1 TABLET BY MOUTH EVERYDAY AT BEDTIME, Disp: 30 tablet, Rfl: 5   Multiple Vitamin (MULTIVITAMIN) tablet, Take 1 tablet by mouth daily., Disp: , Rfl:    predniSONE (DELTASONE) 20 MG tablet, Take 1 tablet (20 mg total) by mouth daily with breakfast., Disp: 5 tablet, Rfl: 0   rosuvastatin (CRESTOR) 20 MG tablet, TAKE 1  TABLET BY MOUTH EVERY DAY, Disp: 30 tablet, Rfl: 11   TART CHERRY PO, Take by mouth., Disp: , Rfl:    traZODone (DESYREL) 50 MG tablet, TAKE 0.5-1 TABLETS BY MOUTH AT BEDTIME AS NEEDED FOR SLEEP., Disp: 90 tablet, Rfl: 2   triamcinolone (KENALOG) 0.1 % paste, SMARTSIG:1 TO TEETH Every Night, Disp: , Rfl:    valACYclovir (VALTREX) 500 MG tablet, Take 500 mg by mouth daily., Disp: , Rfl:    famotidine (PEPCID) 40 MG tablet, TAKE 1 TABLET BY MOUTH 2 TIMES DAILY. TAKE 1 TABLET EVERY MORNING AND EVERY NIGHT, Disp: 180 tablet, Rfl: 0   Ferrous Sulfate (IRON) 325 (65 FE) MG TABS, Take 325 mg by mouth daily. , Disp: , Rfl:    HYDROcodone-acetaminophen (NORCO/VICODIN) 5-325 MG tablet, Take 1-2 tablets by mouth every 6 (six) hours as needed for moderate pain (post op pain)., Disp: 40 tablet, Rfl: 0   sucralfate (CARAFATE) 1 g tablet, Take 1 tablet (1 g total) by mouth 4 (four) times daily. Take one tablet 30-60 minutes before breakfast., Disp: 120 tablet, Rfl: 3   tiZANidine (ZANAFLEX) 4 MG tablet, Take 1 tablet (4 mg total) by mouth every 6 (six) hours as needed for muscle spasms., Disp: 40 tablet, Rfl: 1   Objective:     Vitals:   04/19/22 1059  BP: 110/82  Pulse: 63  SpO2: 96%  Weight: 193 lb (87.5 kg)  Height: 6' (1.829 m)      Body mass index is 26.18 kg/m.    Physical Exam:    Gen: Appears well, nad, nontoxic and pleasant Psych: Alert and oriented, appropriate mood and affect Neuro: sensation intact, strength is 5/5 with df/pf/inv/ev, muscle tone wnl Skin: no susupicious lesions or rashes  Right foot/ankle: no deformity.  Moderate generalized swelling and effusion extending through the lower third of shin TTP mildly medial malleolus.  TTP moderately deltoid ligament NTTP over fibular head, lat mal,   achilles, navicular, base of 5th, ATFL, CFL,  calcaneous or midfoot ROM DF 20, PF 35, inv/ev intact Negative ant drawer, talar tilt, rotation test, squeeze test. Neg thompson No pain  with  eversion  Medial ankle pain with inversion  Electronically signed by:  Matthew [name] D.Matthew [name] Sports Medicine 11:32 AM 04/19/22

## 2022-05-11 ENCOUNTER — Encounter: Payer: Self-pay | Admitting: Family Medicine

## 2022-05-11 ENCOUNTER — Encounter: Payer: Self-pay | Admitting: Sports Medicine

## 2022-05-25 ENCOUNTER — Ambulatory Visit (INDEPENDENT_AMBULATORY_CARE_PROVIDER_SITE_OTHER): Payer: 59 | Admitting: Psychology

## 2022-05-25 DIAGNOSIS — F4322 Adjustment disorder with anxiety: Secondary | ICD-10-CM

## 2022-05-25 NOTE — Progress Notes (Signed)
[facility] Counselor/Therapist Progress Note  Patient ID: ALAND CHESTNUTT, MRN: [id number]   Date: 05/25/22  Time Spent: 11:03  am - 11:40  am : 37 Minutes  Treatment Type: Individual Therapy.  Reported Symptoms: Anxiety  Mental Status Exam: Appearance:  Neat and Well Groomed     Behavior: Appropriate  Motor: Normal  Speech/Language:  Clear and Coherent  Affect: Congruent  Mood: normal  Thought process: normal  Thought content:   WNL  Sensory/Perceptual disturbances:   WNL  Orientation: oriented to person, place, time/date, and situation  Attention: Good  Concentration: Good  Memory: WNL  Fund of knowledge:  Good  Insight:   Good  Judgment:  Good  Impulse Control: Good   Risk Assessment: Danger to Self:  No Self-injurious Behavior: No Danger to Others: No Duty to Warn:no Physical Aggression / Violence:No  Access to Firearms a concern: No  Gang Involvement:No   Subjective:   Tomma Rakers participated from office, via video and consented to treatment. Therapist participated from home office. We met online due to [place] pandemic. Livingston reviewed the events of the past week. Tayvion noted things going well since our last session. Zamier  noted a full knee replacement in the past three months and noted walking and driving. He noted an upcoming event, a celebration brother's B-day celebration in August. He noted his mother living nearby to brother. He noted wondering how he will react when his mother passes. 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 He noted worrying about being "pulled" into that role of being a care-taker. Vidit noted that his mother"got tired" of being called mom and asked to be called Santiago Glad. He noted a need to communicate with his daughter and open the door to deeper conversations and a stronger relationship. We explored ways to communicate effectively and empathy. He  noted thinking about his retirement plan and working on gaining clarity regarding this. He noted his efforts to identify additional hobbies and activities. We worked on exploring this during the session. Therapist praised Jowell for his effort and energy during the session and his disclosure.  He noted improved sleep post-op. Therapist provided supportive therapy.  Interventions:  Interpersonal   Diagnosis:  Adjustment disorder with anxiety  Treatment Plan:  Client Abilities/Strengths Khyler is intelligent, forthcoming, and motivated for change.   Support System: Family and friends.   Client Treatment Preferences Outpatient Therapy.    Client Statement of Needs Avary would like to be challenged in therapy, work on managing symptoms, understand past and current life stressors, and call "bullshit" on me when I'm "dancing" around. Shade noted difficulty becoming introspective.   Treatment Level Weekly  Symptoms  Anxiety: irritability, restlessness, trouble relaxing (Status: maintained)   Goals:   Lyan experiences symptoms of anxiety with some depression.    Target Date: 02/01/2023 Frequency: Weekly  Progress: 0 Modality: individual    Therapist will provide referrals for additional resources as appropriate.  Therapist will provide psycho-education regarding Prateek's diagnosis and corresponding treatment approaches and interventions. Licensed Clinical Social Worker, [place], LCSW will support the patient's ability to achieve the goals identified. will employ CBT, BA, Problem-solving, Solution Focused, Mindfulness,  coping skills, & other evidenced-based practices will be used to promote progress towards healthy functioning to help manage decrease symptoms associated with his diagnosis.   Reduce overall level, frequency, and intensity of the feelings of anxiety evidenced by decreased overall symptoms from  6 to 7 days/week to 0 to 1 days/week per client report for at least 3  consecutive months. Verbally express understanding of the relationship between feelings of depression, anxiety and their impact on thinking patterns and behaviors. Verbalize an understanding of the role that distorted thinking plays in creating fears, excessive worry, and ruminations.   [name] [name] participated in the creation of the treatment plan)  Buena Irish, LCSW

## 2022-07-06 ENCOUNTER — Ambulatory Visit (INDEPENDENT_AMBULATORY_CARE_PROVIDER_SITE_OTHER): Payer: 59 | Admitting: Psychology

## 2022-07-06 DIAGNOSIS — F4322 Adjustment disorder with anxiety: Secondary | ICD-10-CM | POA: Diagnosis not present

## 2022-07-06 NOTE — Progress Notes (Signed)
[facility] Counselor/Therapist Progress Note  Patient ID: Matthew [name], MRN: [id number]   Date: 07/06/22  Time Spent: 11:03  am -  11:55 am : 52 Minutes  Treatment Type: Individual Therapy.  Reported Symptoms: Anxiety  Mental Status Exam: Appearance:  Neat and Well Groomed     Behavior: Appropriate  Motor: Normal  Speech/Language:  Clear and Coherent  Affect: Congruent  Mood: anxious  Thought process: normal  Thought content:   WNL  Sensory/Perceptual disturbances:   WNL  Orientation: oriented to person, place, time/date, and situation  Attention: Good  Concentration: Good  Memory: WNL  Fund of knowledge:  Good  Insight:   Good  Judgment:  Good  Impulse Control: Good   Risk Assessment: Danger to Self:  No Self-injurious Behavior: No Danger to Others: No Duty to Warn:no Physical Aggression / Violence:No  Access to Firearms a concern: No  Gang Involvement:No   Subjective:   Matthew [name] participated from office, via video and consented to treatment. Therapist participated from home office. We met online due to Matthew [name] pandemic. Matthew [name] reviewed the events of the past week. Matthew [name] noted his daughter's upcoming marriage and his hope that the marriage will succeed but noted fear that it won't. He noted worry that this is projecting his own feelings and thoughts. He noted getting married without certainty of the longevity of the marriage/relationship. He noted growing even more to love and appreciate Matthew [name] over time. He identified some unmet needs including words of affirmation and physical touch. Concerns for his daughter's upcoming marriage include a lack of experience in relationships and finances. HE noted that he and Matthew [name] have supplemented her life-style and Matthew [name] noted his worry that Matthew [name] and Matthew [name] might not be prepared for the adjustments necessary including financial and otherwise.  We explored communication and boundaries for daughter and son in-law. We  role-played this during the session and identified feelings and concerns. Matthew [name] was engaged and motivated during the session. Therapist validated and normalized Matthew [name]'s feelings. Therapist provided supportive therapy.   Interventions:  Interpersonal   Diagnosis:  Adjustment disorder with anxiety  Treatment Plan:  Client Abilities/Strengths Matthew [name] is intelligent, forthcoming, and motivated for change.   Support System: Family and friends.   Client Treatment Preferences Outpatient Therapy.    Client Statement of Needs Matthew [name] would like to be challenged in therapy, work on managing symptoms, understand past and current life stressors, and call "bullshit" on me when I'm "dancing" around. Beren noted difficulty becoming introspective.   Treatment Level Weekly  Symptoms  Anxiety: irritability, restlessness, trouble relaxing (Status: maintained)   Goals:   Matthew [name] experiences symptoms of anxiety with some depression.    Target Date: 02/01/2023 Frequency: Weekly  Progress: 0 Modality: individual    Therapist will provide referrals for additional resources as appropriate.  Therapist will provide psycho-education regarding Bentleigh's diagnosis and corresponding treatment approaches and interventions. Licensed Clinical Social Worker, [place], LCSW will support the patient's ability to achieve the goals identified. will employ CBT, BA, Problem-solving, Solution Focused, Mindfulness,  coping skills, & other evidenced-based practices will be used to promote progress towards healthy functioning to help manage decrease symptoms associated with his diagnosis.   Reduce overall level, frequency, and intensity of the feelings of anxiety evidenced by decreased overall symptoms from 6 to 7 days/week to 0 to 1 days/week per client report for at least 3 consecutive months. Verbally express understanding of the relationship between feelings of depression, anxiety and their impact on thinking patterns  and behaviors. Verbalize an understanding of the role that distorted thinking plays in creating fears, excessive worry, and ruminations.   Matthew [name] participated in the creation of the treatment plan)  Buena Irish, LCSW

## 2022-07-07 ENCOUNTER — Other Ambulatory Visit: Payer: Self-pay | Admitting: Family Medicine

## 2022-07-08 ENCOUNTER — Other Ambulatory Visit: Payer: Self-pay | Admitting: Cardiology

## 2022-07-14 NOTE — Progress Notes (Unsigned)
[facility] [place] Phone: [phone] Subjective:   Matthew [name], am serving as a scribe for Dr. Hulan Saas.   I'm seeing this patient by the request  of:  [name], MD  CC: shoulder pain   [id number]  03/07/2022 Patient had significant amount of synovial fluid again with more than 73 cc aspirated today.  Given a steroid injection.  MRI shows severe bone-on-bone medial compartment osteoarthritic changes noted.   Patient will need a replacement.  Discussed icing regimen and home otherwise.  Discussed which activities to do and which ones to avoid.  Will refer patient to orthopedic surgery and get him in to discuss surgery in the near future.  Patient was given a short course of prednisone while patient is traveling to [place].  Patient's goal though is to be able to dance at his daughter's wedding in October.  We will forward note to orthopedic surgeon to [name] obvious pressing the timeline.  Seen for right ankle pain by Dr. Glennon Mac in June 2023.  Updated 07/19/2022 Matthew [name] is a 66 y.o. male coming in with complaint of L shoulder pain. Painful with flexion. Painful over top to shoulder.   Last injection in April of this year       Past Medical History:  Diagnosis Date   Allergy    RHINITIS   Anemia    Arthritis    Bursitis of left foot 11/11/2016   Colonic polyp 09/2006   colonoscopy   Ganglion 11/02/2017   GERD (gastroesophageal reflux disease)    Golfer's elbow, right 09/25/2019   Gout    Heart beat abnormality 12/14/2017   Heart murmur    Herpes genitalis in men 02/14/2014   Hypertension    Osteitis pubis ([place]) 01/27/2016   Primary osteoarthritis of left knee 11/06/2017   Right groin pain 11/08/2018   SOB (shortness of breath)    per pt, may be coming from new medication   Subluxation of distal radial-ulnar joint 12/03/2019   Subluxation of tendon of long head of biceps 10/15/2016   Injected  shoulder 12/19/2016   Subungual hematoma of foot, initial encounter 05/29/2018   SVT (supraventricular tachycardia) ([place]) 03/09/2020   Past Surgical History:  Procedure Laterality Date   COLONOSCOPY  09/2006   KNEE ARTHROSCOPY Left 2011   KNEE SURGERY Left 08/2018   scar tissue surgery   LUMBAR DISC SURGERY  1990   SVT ABLATION N/A 01/09/2020   Procedure: SVT ABLATION;  Surgeon: Constance Haw, MD;  Location: [facility];  Service: Cardiovascular;  Laterality: N/A;   TOTAL HIP ARTHROPLASTY Left 07/20/2021   Procedure: LEFT TOTAL HIP ARTHROPLASTY ANTERIOR APPROACH;  Surgeon: Melrose Nakayama, MD;  Location: [facility];  Service: Orthopedics;  Laterality: Left;   TOTAL KNEE ARTHROPLASTY Left 11/06/2017   Procedure: TOTAL KNEE ARTHROPLASTY;  Surgeon: Dorna Leitz, MD;  Location: [facility];  Service: Orthopedics;  Laterality: Left;   Social History   Socioeconomic History   Marital status: Married    Spouse name: Not on file   Number of children: 1   Years of education: Not on file   Highest education level: Not on file  Occupational History   Not on file  Tobacco Use   Smoking status: Some Days    Types: Cigars   Smokeless tobacco: Never   Tobacco comments:    occasional cigar  Vaping Use   Vaping Use: Never used  Substance  and Sexual Activity   Alcohol use: Yes    Alcohol/week: 3.0 standard drinks of alcohol    Types: 3 Standard drinks or equivalent per week    Comment: daily   Drug use: [name]   Sexual activity: Yes  Other Topics Concern   Not on file  Social History Narrative   Not on file   Social Determinants of Health   Financial Resource Strain: Not on file  Food Insecurity: Not on file  Transportation Needs: Not on file  Physical Activity: Not on file  Stress: Not on file  Social Connections: Not on file   Allergies  Allergen Reactions   Beta Adrenergic Blockers Other (See Comments)    Fatigue   Lisinopril Cough   Asa [Aspirin] Rash    High dosage ASA  causes break out   Demerol Itching   Family History  Problem Relation Age of Onset   Stroke Father    Gout Father    Depression Brother    Dermatomyositis Daughter    Colon cancer Neg Hx    Rectal cancer Neg Hx    Esophageal cancer Neg Hx    Pancreatic cancer Neg Hx    Stomach cancer Neg Hx    Liver disease Neg Hx     Current Outpatient Medications (Endocrine & Metabolic):    predniSONE (DELTASONE) 20 MG tablet, Take 1 tablet (20 mg total) by mouth daily with breakfast.  Current Outpatient Medications (Cardiovascular):    diltiazem (CARDIZEM CD) 360 MG 24 hr capsule, TAKE 1 CAPSULE BY MOUTH EVERY DAY   flecainide (TAMBOCOR) 50 MG tablet, Take 50 mg by mouth 2 (two) times daily.   rosuvastatin (CRESTOR) 20 MG tablet, TAKE 1 TABLET BY MOUTH EVERY DAY   losartan-hydrochlorothiazide (HYZAAR) 50-12.5 MG tablet, TAKE 1 TABLET BY MOUTH EVERY DAY  Current Outpatient Medications (Respiratory):    fexofenadine (ALLEGRA) 180 MG tablet, Take 180 mg by mouth at bedtime.    fluticasone (FLONASE) 50 MCG/ACT nasal spray, Place into the nose.   montelukast (SINGULAIR) 10 MG tablet, TAKE 1 TABLET BY MOUTH EVERYDAY AT BEDTIME  Current Outpatient Medications (Analgesics):    allopurinol (ZYLOPRIM) 300 MG tablet, TAKE 1 TABLET BY MOUTH EVERY DAY   aspirin EC 81 MG tablet, Take 1 tablet (81 mg total) by mouth 2 (two) times daily after a meal.   HYDROcodone-acetaminophen (NORCO/VICODIN) 5-325 MG tablet, Take 1-2 tablets by mouth every 6 (six) hours as needed for moderate pain (post op pain).   meloxicam (MOBIC) 15 MG tablet, TAKE 1 TABLET BY MOUTH EVERY DAY  Current Outpatient Medications (Hematological):    Ferrous Sulfate (IRON) 325 (65 FE) MG TABS, Take 325 mg by mouth daily.   Current Outpatient Medications (Other):    cholecalciferol (VITAMIN D) 1000 units tablet, Take 1,000 Units by mouth daily.   dicyclomine (BENTYL) 10 MG/5ML solution,    esomeprazole (NEXIUM) 40 MG capsule, TAKE 1  CAPSULE (40 MG TOTAL) BY MOUTH 2 (TWO) TIMES DAILY BEFORE A MEAL. TAKE ONE TABLET 30-60 MINUTES BEFORE BREAKFAST   gabapentin (NEURONTIN) 100 MG capsule, Take 4 capsules (400 mg total) by mouth at bedtime.   Misc Natural Products (TART CHERRY ADVANCED PO), Take 3,600 mg by mouth daily.   Multiple Vitamin (MULTIVITAMIN) tablet, Take 1 tablet by mouth daily.   TART CHERRY PO, Take by mouth.   traZODone (DESYREL) 50 MG tablet, TAKE 0.5-1 TABLETS BY MOUTH AT BEDTIME AS NEEDED FOR SLEEP.   triamcinolone (KENALOG) 0.1 % paste, SMARTSIG:1 TO  TEETH Every Night   valACYclovir (VALTREX) 500 MG tablet, Take 500 mg by mouth daily.   famotidine (PEPCID) 40 MG tablet, TAKE 1 TABLET BY MOUTH 2 TIMES DAILY. TAKE 1 TABLET EVERY MORNING AND EVERY NIGHT   sucralfate (CARAFATE) 1 g tablet, Take 1 tablet (1 g total) by mouth 4 (four) times daily. Take one tablet 30-60 minutes before breakfast.   tiZANidine (ZANAFLEX) 4 MG tablet, Take 1 tablet (4 mg total) by mouth every 6 (six) hours as needed for muscle spasms.   Reviewed prior external information including notes and imaging from  primary care provider As well as notes that were available from care everywhere and other healthcare systems.  Past medical history, social, surgical and family history all reviewed in electronic medical record.  [name]   Review of Systems:  [name], visual changes, nausea, vomiting, diarrhea, constipation, dizziness, abdominal pain, skin rash, fevers, chills, night sweats, weight loss, swollen lymph nodes, body aches, joint swelling, chest pain, shortness of breath, mood changes. POSITIVE muscle aches  Objective  Blood pressure 118/84, pulse 85, height 6' (1.829 m), SpO2 98 %.   General: [name], dressed appropriately.  HEENT: Pupils equal, extraocular movements intact  Respiratory: Patient's speak in  full sentences and does not appear short of breath  Cardiovascular: [name], non tender, [name]  Shoulder exam shows patient does have positive impingement noted.  Some mild limited range of motion in external range of motion but otherwise well.  Procedure: Real-time Ultrasound Guided Injection of left subacromial space  Device: GE Logiq E  Ultrasound guided injection is preferred based studies that show increased duration, increased effect, greater accuracy, decreased procedural pain, increased response rate with ultrasound guided versus blind injection.  Verbal informed consent obtained.  Time-out conducted.  Noted [name] overlying erythema, induration, or other signs of local infection.  Skin prepped in a sterile fashion.  Local anesthesia: Topical Ethyl chloride.  With sterile technique and under real time ultrasound guidance:  Joint visualized.  21g 2 inch needle inserted lateral approach. Pictures taken for needle placement. Patient did have injection of 2 cc of 0.5% Marcaine, and 1cc of Kenalog 40 mg/dL. Completed without difficulty  Pain immediately resolved suggesting accurate placement of the medication.  Advised to call if fevers/chills, erythema, induration, drainage, or persistent bleeding.  Impression: Technically successful ultrasound guided injection.    Impression and Recommendations:    The above documentation has been reviewed and is accurate and complete Lyndal Pulley, DO

## 2022-07-19 ENCOUNTER — Ambulatory Visit (INDEPENDENT_AMBULATORY_CARE_PROVIDER_SITE_OTHER): Payer: 59 | Admitting: Family Medicine

## 2022-07-19 ENCOUNTER — Encounter: Payer: Self-pay | Admitting: Family Medicine

## 2022-07-19 ENCOUNTER — Ambulatory Visit: Payer: Self-pay

## 2022-07-19 VITALS — BP 118/84 | HR 85 | Ht 72.0 in

## 2022-07-19 DIAGNOSIS — M2550 Pain in unspecified joint: Secondary | ICD-10-CM

## 2022-07-19 DIAGNOSIS — G8929 Other chronic pain: Secondary | ICD-10-CM | POA: Diagnosis not present

## 2022-07-19 DIAGNOSIS — M25512 Pain in left shoulder: Secondary | ICD-10-CM

## 2022-07-19 LAB — COMPREHENSIVE METABOLIC PANEL
ALT: 19 U/L (ref 0–53)
AST: 24 U/L (ref 0–37)
Albumin: 4.5 g/dL (ref 3.5–5.2)
Alkaline Phosphatase: 59 U/L (ref 39–117)
BUN: 12 mg/dL (ref 6–23)
CO2: 26 mEq/L (ref 19–32)
Calcium: 9.9 mg/dL (ref 8.4–10.5)
Chloride: 102 mEq/L (ref 96–112)
Creatinine, Ser: 0.81 mg/dL (ref 0.40–1.50)
GFR: 91.84 mL/min (ref >60.00)
Glucose, Bld: 81 mg/dL (ref 70–99)
Potassium: 3.6 mEq/L (ref 3.5–5.1)
Sodium: 137 mEq/L (ref 135–145)
Total Bilirubin: 0.4 mg/dL (ref 0.2–1.2)
Total Protein: 7.4 g/dL (ref 6.0–8.3)

## 2022-07-19 LAB — CBC WITH DIFFERENTIAL/PLATELET
Basophils Absolute: 0 10*3/uL (ref 0.0–0.1)
Basophils Relative: 0.7 % (ref 0.0–3.0)
Eosinophils Absolute: 0.1 10*3/uL (ref 0.0–0.7)
Eosinophils Relative: 2 % (ref 0.0–5.0)
HCT: 37.1 % — ABNORMAL LOW (ref 39.0–52.0)
Hemoglobin: 12.5 g/dL — ABNORMAL LOW (ref 13.0–17.0)
Lymphocytes Relative: 23.3 % (ref 12.0–46.0)
Lymphs Abs: 1.5 10*3/uL (ref 0.7–4.0)
MCHC: 33.6 g/dL (ref 30.0–36.0)
MCV: 96.3 fl (ref 78.0–100.0)
Monocytes Absolute: 0.8 10*3/uL (ref 0.1–1.0)
Monocytes Relative: 11.9 % (ref 3.0–12.0)
Neutro Abs: 3.9 10*3/uL (ref 1.4–7.7)
Neutrophils Relative %: 62.1 % (ref 43.0–77.0)
Platelets: 334 10*3/uL (ref 150.0–400.0)
RBC: 3.85 Mil/uL — ABNORMAL LOW (ref 4.22–5.81)
RDW: 13.6 % (ref 11.5–15.5)
WBC: 6.3 10*3/uL (ref 4.0–10.5)

## 2022-07-19 LAB — LIPID PANEL
Cholesterol: 130 mg/dL (ref 0–200)
HDL: 67.5 mg/dL (ref >39.00)
LDL Cholesterol: 43 mg/dL (ref 0–99)
NonHDL: 62.31
Total CHOL/HDL Ratio: 2
Triglycerides: 95 mg/dL (ref 0.0–149.0)
VLDL: 19 mg/dL (ref 0.0–40.0)

## 2022-07-19 LAB — IBC PANEL
Iron: 88 ug/dL (ref 42–165)
Saturation Ratios: 25.4 % (ref 20.0–50.0)
TIBC: 345.8 ug/dL (ref 250.0–450.0)
Transferrin: 247 mg/dL (ref 212.0–360.0)

## 2022-07-19 LAB — URIC ACID: Uric Acid, Serum: 3.9 mg/dL — ABNORMAL LOW (ref 4.0–7.8)

## 2022-07-19 LAB — TSH: TSH: 1.68 u[IU]/mL (ref 0.35–5.50)

## 2022-07-19 LAB — VITAMIN D 25 HYDROXY (VIT D DEFICIENCY, FRACTURES): VITD: 30.6 ng/mL (ref 30.00–100.00)

## 2022-07-19 LAB — FERRITIN: Ferritin: 104.7 ng/mL (ref 22.0–322.0)

## 2022-07-19 NOTE — Patient Instructions (Addendum)
L shoulder injection today Let is rest for a few days Labs today See me in 8 weeks if not perfect

## 2022-07-19 NOTE — Assessment & Plan Note (Signed)
Repeat injection given today, tolerated the procedure well, discussed icing regimen and home exercises.  Has had an injection previously and hopefully this will make more improvement.  If worsening pain do need to consider the possibility of advanced imaging.  Patient understands this.  Could be a candidate for possible PRP depending on findings if necessary.  Follow-up again in 6 to 8 weeks

## 2022-07-22 ENCOUNTER — Encounter: Payer: Self-pay | Admitting: Internal Medicine

## 2022-07-28 ENCOUNTER — Ambulatory Visit (INDEPENDENT_AMBULATORY_CARE_PROVIDER_SITE_OTHER): Payer: 59 | Admitting: Psychology

## 2022-07-28 DIAGNOSIS — F4322 Adjustment disorder with anxiety: Secondary | ICD-10-CM
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
## 2022-08-18 ENCOUNTER — Ambulatory Visit (INDEPENDENT_AMBULATORY_CARE_PROVIDER_SITE_OTHER): Payer: 59 | Admitting: Psychology

## 2022-08-18 DIAGNOSIS — F4322 Adjustment disorder with anxiety: Secondary | ICD-10-CM | POA: Diagnosis not present
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
## 2022-09-08 NOTE — Progress Notes (Signed)
[facility] [place] Phone: [phone] Subjective:   [name], am serving as a scribe for Dr. Hulan Saas.  I'm seeing this patient by the request  of:  [name], MD  CC: Left shoulder pain, recent food poisoning  NIO:[id number]  07/19/2022 Repeat injection given today, tolerated the procedure well, discussed icing regimen and home exercises.  Has had an injection previously and hopefully this will make more improvement.  If worsening pain do need to consider the possibility of advanced imaging.  Patient understands this.  Could be a candidate for possible PRP depending on findings if necessary.  Follow-up again in 6 to 8 weeks     Update 09/13/2022 HAVISH PETTIES is a 66 y.o. male coming in with complaint of L shoulder pain. Patient states that he is doing well. Was able to do 15 pull ups this morning.   Patient mentions having what he believes is an allergic reaction after eating tuna salad at [facility] recently.  Patient had a rash within an hour of eating.  Felt like not good.  3 to 4 hours later had significant diarrhea.  Since then is feeling better.       Past Medical History:  Diagnosis Date   Allergy    RHINITIS   Anemia    Arthritis    Bursitis of left foot 11/11/2016   Colonic polyp 09/2006   colonoscopy   Ganglion 11/02/2017   GERD (gastroesophageal reflux disease)    Golfer's elbow, right 09/25/2019   Gout    Heart beat abnormality 12/14/2017   Heart murmur    Herpes genitalis in men 02/14/2014   Hypertension    Osteitis pubis ([facility]) 01/27/2016   Primary osteoarthritis of left knee 11/06/2017   Right groin pain 11/08/2018   SOB (shortness of breath)    per pt, may be coming from new medication   Subluxation of distal radial-ulnar joint 12/03/2019   Subluxation of tendon of long head of biceps 10/15/2016   Injected shoulder 12/19/2016   Subungual hematoma of foot, initial encounter  05/29/2018   SVT (supraventricular tachycardia) ([facility]) 03/09/2020   Past Surgical History:  Procedure Laterality Date   COLONOSCOPY  09/2006   KNEE ARTHROSCOPY Left 2011   KNEE SURGERY Left 08/2018   scar tissue surgery   LUMBAR DISC SURGERY  1990   SVT ABLATION N/A 01/09/2020   Procedure: SVT ABLATION;  Surgeon: Constance Haw, MD;  Location: [facility];  Service: Cardiovascular;  Laterality: N/A;   TOTAL HIP ARTHROPLASTY Left 07/20/2021   Procedure: LEFT TOTAL HIP ARTHROPLASTY ANTERIOR APPROACH;  Surgeon: Melrose Nakayama, MD;  Location: [facility];  Service: Orthopedics;  Laterality: Left;   TOTAL KNEE ARTHROPLASTY Left 11/06/2017   Procedure: TOTAL KNEE ARTHROPLASTY;  Surgeon: Dorna Leitz, MD;  Location: [facility];  Service: Orthopedics;  Laterality: Left;   Social History   Socioeconomic History   Marital status: Married    Spouse name: Not on file   Number of children: 1   Years of education: Not on file   Highest education level: Not on file  Occupational History   Not on file  Tobacco Use   Smoking status: Some Days    Types: Cigars   Smokeless tobacco: Never   Tobacco comments:    occasional cigar  Vaping Use   Vaping Use: Never used  Substance and Sexual Activity   Alcohol use: Yes  Alcohol/week: 3.0 standard drinks of alcohol    Types: 3 Standard drinks or equivalent per week    Comment: daily   Drug use: No   Sexual activity: Yes  Other Topics Concern   Not on file  Social History Narrative   Not on file   Social Determinants of Health   Financial Resource Strain: Not on file  Food Insecurity: Not on file  Transportation Needs: Not on file  Physical Activity: Not on file  Stress: Not on file  Social Connections: Not on file   Allergies  Allergen Reactions   Beta Adrenergic Blockers Other (See Comments)    Fatigue   Lisinopril Cough   Asa [Aspirin] Rash    High dosage ASA causes break out   Demerol Itching   Family History  Problem  Relation Age of Onset   Stroke Father    Gout Father    Depression Brother    Dermatomyositis Daughter    Colon cancer Neg Hx    Rectal cancer Neg Hx    Esophageal cancer Neg Hx    Pancreatic cancer Neg Hx    Stomach cancer Neg Hx    Liver disease Neg Hx      Current Outpatient Medications (Cardiovascular):    diltiazem (CARDIZEM CD) 360 MG 24 hr capsule, TAKE 1 CAPSULE BY MOUTH EVERY DAY   flecainide (TAMBOCOR) 50 MG tablet, Take 50 mg by mouth 2 (two) times daily.   rosuvastatin (CRESTOR) 20 MG tablet, TAKE 1 TABLET BY MOUTH EVERY DAY   losartan-hydrochlorothiazide (HYZAAR) 50-12.5 MG tablet, TAKE 1 TABLET BY MOUTH EVERY DAY  Current Outpatient Medications (Respiratory):    fexofenadine (ALLEGRA) 180 MG tablet, Take 180 mg by mouth at bedtime.    fluticasone (FLONASE) 50 MCG/ACT nasal spray, Place into the nose.   montelukast (SINGULAIR) 10 MG tablet, TAKE 1 TABLET BY MOUTH EVERYDAY AT BEDTIME  Current Outpatient Medications (Analgesics):    allopurinol (ZYLOPRIM) 300 MG tablet, TAKE 1 TABLET BY MOUTH EVERY DAY   Current Outpatient Medications (Other):    cholecalciferol (VITAMIN D) 1000 units tablet, Take 1,000 Units by mouth daily.   dicyclomine (BENTYL) 10 MG/5ML solution,    esomeprazole (NEXIUM) 40 MG capsule, TAKE 1 CAPSULE (40 MG TOTAL) BY MOUTH 2 (TWO) TIMES DAILY BEFORE A MEAL. TAKE ONE TABLET 30-60 MINUTES BEFORE BREAKFAST   gabapentin (NEURONTIN) 100 MG capsule, Take 4 capsules (400 mg total) by mouth at bedtime.   Misc Natural Products (TART CHERRY ADVANCED PO), Take 3,600 mg by mouth daily.   Multiple Vitamin (MULTIVITAMIN) tablet, Take 1 tablet by mouth daily.   TART CHERRY PO, Take by mouth.   traZODone (DESYREL) 50 MG tablet, TAKE 0.5-1 TABLETS BY MOUTH AT BEDTIME AS NEEDED FOR SLEEP.   triamcinolone (KENALOG) 0.1 % paste, SMARTSIG:1 TO TEETH Every Night   valACYclovir (VALTREX) 500 MG tablet, Take 500 mg by mouth daily.    Review of Systems:  No  headache, visual changes, nausea, vomiting,, constipation, dizziness, abdominal pain, , fevers, chills, night sweats, weight loss, swollen lymph nodes, body aches, joint swelling, chest pain, shortness of breath, mood changes. POSITIVE muscle aches, rash, diarrhea  Objective  Blood pressure 132/78, pulse 86, height 6' (1.829 m), weight 186 lb (84.4 kg).   General: No apparent distress alert and oriented x3 mood and affect normal, dressed appropriately.  HEENT: Pupils equal, extraocular movements intact  Respiratory: Patient's speak in full sentences and does not appear short of breath  Cardiovascular: No lower extremity  edema, non tender, no erythema  Right knee exam does have the surgical intervention noted.  No significant swelling, mild crepitus still though noted.  Patient 95 degrees.    Impression and Recommendations:     The above documentation has been reviewed and is accurate and complete Lyndal Pulley, DO

## 2022-09-09 ENCOUNTER — Ambulatory Visit: Payer: 59 | Admitting: Psychology

## 2022-09-12 NOTE — Progress Notes (Deleted)
Subjective:    Patient ID: Matthew [name], male    DOB: January 22, 1956, 66 y.o.   MRN: [id number]      HPI Effrey is here for No chief complaint on file.   Allergic reaction - he ate something at lunch yesterday that he thinks caused an allergic reaction - he was red all over, lightheaded, nauseous.  He took benadryl.         Medications and allergies reviewed with patient and updated if appropriate.  Current Outpatient Medications on File Prior to Visit  Medication Sig Dispense Refill   allopurinol (ZYLOPRIM) 300 MG tablet TAKE 1 TABLET BY MOUTH EVERY DAY 90 tablet 1   aspirin EC 81 MG tablet Take 1 tablet (81 mg total) by mouth 2 (two) times daily after a meal. 45 tablet 0   cholecalciferol (VITAMIN D) 1000 units tablet Take 1,000 Units by mouth daily.     dicyclomine (BENTYL) 10 MG/5ML solution      diltiazem (CARDIZEM CD) 360 MG 24 hr capsule TAKE 1 CAPSULE BY MOUTH EVERY DAY 30 capsule 0   esomeprazole (NEXIUM) 40 MG capsule TAKE 1 CAPSULE (40 MG TOTAL) BY MOUTH 2 (TWO) TIMES DAILY BEFORE A MEAL. TAKE ONE TABLET 30-60 MINUTES BEFORE BREAKFAST 60 capsule 3   famotidine (PEPCID) 40 MG tablet TAKE 1 TABLET BY MOUTH 2 TIMES DAILY. TAKE 1 TABLET EVERY MORNING AND EVERY NIGHT 180 tablet 0   Ferrous Sulfate (IRON) 325 (65 FE) MG TABS Take 325 mg by mouth daily.      fexofenadine (ALLEGRA) 180 MG tablet Take 180 mg by mouth at bedtime.      flecainide (TAMBOCOR) 50 MG tablet Take 50 mg by mouth 2 (two) times daily.     fluticasone (FLONASE) 50 MCG/ACT nasal spray Place into the nose.     gabapentin (NEURONTIN) 100 MG capsule Take 4 capsules (400 mg total) by mouth at bedtime. 360 capsule 3   losartan-hydrochlorothiazide (HYZAAR) 50-12.5 MG tablet TAKE 1 TABLET BY MOUTH EVERY DAY 90 tablet 0   meloxicam (MOBIC) 15 MG tablet TAKE 1 TABLET BY MOUTH EVERY DAY 30 tablet 2   Misc Natural Products (TART CHERRY ADVANCED PO) Take 3,600 mg by mouth daily.     montelukast (SINGULAIR) 10 MG  tablet TAKE 1 TABLET BY MOUTH EVERYDAY AT BEDTIME 90 tablet 1   Multiple Vitamin (MULTIVITAMIN) tablet Take 1 tablet by mouth daily.     predniSONE (DELTASONE) 20 MG tablet Take 1 tablet (20 mg total) by mouth daily with breakfast. 5 tablet 0   rosuvastatin (CRESTOR) 20 MG tablet TAKE 1 TABLET BY MOUTH EVERY DAY 30 tablet 11   sucralfate (CARAFATE) 1 g tablet Take 1 tablet (1 g total) by mouth 4 (four) times daily. Take one tablet 30-60 minutes before breakfast. 120 tablet 3   TART CHERRY PO Take by mouth.     traZODone (DESYREL) 50 MG tablet TAKE 0.5-1 TABLETS BY MOUTH AT BEDTIME AS NEEDED FOR SLEEP. 90 tablet 2   triamcinolone (KENALOG) 0.1 % paste SMARTSIG:1 TO TEETH Every Night     valACYclovir (VALTREX) 500 MG tablet Take 500 mg by mouth daily.     No current facility-administered medications on file prior to visit.    Review of Systems     Objective:  There were no vitals filed for this visit. BP Readings from Last 3 Encounters:  07/19/22 118/84  04/19/22 110/82  03/07/22 130/90   Wt Readings from Last 3 Encounters:  04/19/22 193 lb (87.5 kg)  03/07/22 192 lb (87.1 kg)  02/14/22 195 lb (88.5 kg)   There is no height or weight on file to calculate BMI.    Physical Exam         Assessment & Plan:    See Problem List for Assessment and Plan of chronic medical problems.

## 2022-09-13 ENCOUNTER — Ambulatory Visit: Payer: 59 | Admitting: Internal Medicine

## 2022-09-14 ENCOUNTER — Ambulatory Visit: Payer: Self-pay

## 2022-09-14 ENCOUNTER — Ambulatory Visit (INDEPENDENT_AMBULATORY_CARE_PROVIDER_SITE_OTHER): Payer: 59 | Admitting: Family Medicine

## 2022-09-14 VITALS — BP 132/78 | HR 86 | Ht 72.0 in | Wt 186.0 lb

## 2022-09-14 DIAGNOSIS — M25512 Pain in left shoulder: Secondary | ICD-10-CM | POA: Diagnosis not present

## 2022-09-14 DIAGNOSIS — M1711 Unilateral primary osteoarthritis, right knee: Secondary | ICD-10-CM | POA: Diagnosis not present

## 2022-09-14 DIAGNOSIS — A059 Bacterial foodborne intoxication, unspecified: Secondary | ICD-10-CM | POA: Diagnosis not present

## 2022-09-14 NOTE — Assessment & Plan Note (Signed)
Patient's timing and rash is consistent with more of a toxin food poisoning.  This could be either E. coli, staph, bacillus as possibilities.  Patient did have green leafy vegetables that could have been the triggering factor.  We discussed with patient that at this moment he looks relatively good and all vitals are stable.  Patient will follow-up with me on an as-needed basis for this.

## 2022-09-14 NOTE — Assessment & Plan Note (Signed)
Surgical intervention and with a knee replacement and doing well at this time

## 2022-09-23 ENCOUNTER — Other Ambulatory Visit: Payer: Self-pay | Admitting: Family Medicine

## 2022-09-23 DIAGNOSIS — M109 Gout, unspecified: Secondary | ICD-10-CM

## 2022-10-03 ENCOUNTER — Ambulatory Visit (INDEPENDENT_AMBULATORY_CARE_PROVIDER_SITE_OTHER): Payer: 59 | Admitting: Psychology

## 2022-10-03 DIAGNOSIS — F4322 Adjustment disorder with anxiety: Secondary | ICD-10-CM | POA: Diagnosis not present

## 2022-10-03 NOTE — Progress Notes (Signed)
[place] Counselor/Therapist Progress Note  Patient ID: Matthew [name], MRN: [id number]   Date: 10/03/22  Time Spent: 12:31 pm  -  1:09 pm : 38 Minutes  Treatment Type: Individual Therapy.  Reported Symptoms: Anxiety  Mental Status Exam: Appearance:  Neat and Well Groomed     Behavior: Appropriate  Motor: Normal  Speech/Language:  Clear and Coherent  Affect: Congruent  Mood: anxious  Thought process: normal  Thought content:   WNL  Sensory/Perceptual disturbances:   WNL  Orientation: oriented to person, place, time/date, and situation  Attention: Good  Concentration: Good  Memory: WNL  Fund of knowledge:  Good  Insight:   Good  Judgment:  Good  Impulse Control: Good   Risk Assessment: Danger to Self:  No Self-injurious Behavior: No Danger to Others: No Duty to Warn:no Physical Aggression / Violence:No  Access to Firearms a concern: No  Gang Involvement:No   Subjective:   Matthew [name] participated from home, via video and consented to treatment. Therapist participated from home office. We met online due to Matthew [name] pandemic. Matthew [name] reviewed the events of the past week. "Am I chasing stuff to not deal with the inevitable?" In regards to retirement. He noted feeling overwhelmed and losing motivation, at times, with work. He noted wondering "what the right balance should be". He noted worry about numerous issues including finances, how to spend his time, and his wife's reaction to the transition. He noted difficulty having specific conversations, regarding finances, with his wife and noted this being mutual. He noted a need to receive reassurance, during this time, and not receiving this type of support. He noted difficulty giving his wife feedback and noted getting negative feedback. Matthew [name] noted feeling tentative to bring up certain topics due to her response in the past. We will work on processing this during follow-up sessions. He noted a need to give  feedback. He noted dreading this discussion in therapy. We explored this and will continue to, going forward. Matthew [name] was engaged and motivated during the session. Matthew [name] will work on creating a list of enjoyable activities and questions to ask Matthew [name]. Therapist validated and normalized Matthew [name]'s feelings. Therapist provided supportive therapy.   Interventions:  Interpersonal & CBT  Diagnosis:  Adjustment disorder with anxiety  Treatment Plan:  Client Abilities/Strengths Matthew [name] is intelligent, forthcoming, and motivated for change.   Support System: Family and friends.   Client Treatment Preferences Outpatient Therapy.    Client Statement of Needs Matthew [name] would like to be challenged in therapy, work on managing symptoms, understand past and current life stressors, and call "bullshit" on me when I'm "dancing" around. Matthew [name] noted difficulty becoming introspective.   Treatment Level Weekly  Symptoms  Anxiety: irritability, restlessness, trouble relaxing (Status: maintained)   Goals:   Matthew [name] experiences symptoms of anxiety with some depression.    Target Date: 02/01/2023 Frequency: Weekly  Progress: 0 Modality: individual    Therapist will provide referrals for additional resources as appropriate.  Therapist will provide psycho-education regarding Matthew [name] diagnosis and corresponding treatment approaches and interventions. Licensed Clinical Social Worker, [place], LCSW will support the patient's ability to achieve the goals identified. will employ CBT, BA, Problem-solving, Solution Focused, Mindfulness,  coping skills, & other evidenced-based practices will be used to promote progress towards healthy functioning to help manage decrease symptoms associated with his diagnosis.   Reduce overall level, frequency, and intensity of the feelings of anxiety evidenced by decreased overall symptoms from 6 to 7 days/week to 0 to 1  days/week per client report for at least 3 consecutive  months. Verbally express understanding of the relationship between feelings of depression, anxiety and their impact on thinking patterns and behaviors. Verbalize an understanding of the role that distorted thinking plays in creating fears, excessive worry, and ruminations.   Matthew [name] participated in the creation of the treatment plan)  Matthew Irish, LCSW

## 2022-10-17 ENCOUNTER — Ambulatory Visit (INDEPENDENT_AMBULATORY_CARE_PROVIDER_SITE_OTHER): Payer: 59 | Admitting: Psychology

## 2022-10-17 DIAGNOSIS — F4322 Adjustment disorder with anxiety: Secondary | ICD-10-CM | POA: Diagnosis not present

## 2022-10-17 NOTE — Progress Notes (Signed)
[facility] Counselor/Therapist Progress Note  Patient ID: Matthew [name], MRN: [id number]   Date: 10/17/22  Time Spent: 4:02 pm  -  4:41 pm : 39 Minutes  Treatment Type: Individual Therapy.  Reported Symptoms: Anxiety  Mental Status Exam: Appearance:  Neat and Well Groomed     Behavior: Appropriate  Motor: Normal  Speech/Language:  Clear and Coherent  Affect: Congruent  Mood: anxious  Thought process: normal  Thought content:   WNL  Sensory/Perceptual disturbances:   WNL  Orientation: oriented to person, place, time/date, and situation  Attention: Good  Concentration: Good  Memory: WNL  Fund of knowledge:  Good  Insight:   Good  Judgment:  Good  Impulse Control: Good   Risk Assessment: Danger to Self:  No Self-injurious Behavior: No Danger to Others: No Duty to Warn:no Physical Aggression / Violence:No  Access to Firearms a concern: No  Gang Involvement:No   Subjective:   Tomma Rakers participated from home, via video and consented to treatment. Therapist participated from home office. We met online due to [place] pandemic. Arty reviewed the events of the past week. He noted having some discussions with his wife, regarding finances and preferences, and noted this going well. He noted a need to be mindful of picking the right time, communicating clearly, adopting a pace, and be respectful of the other person's perspective. He noted a need to take into account who he is communicating with and their own personality. He noted worry that life would pass him by and having not had an opportunity to address specific issues, concerns, or needs. We discussed ways to discuss concerns about certain topics as a preamble to the discussion itself. Therapist reframed Corneluis's specific needs to more vital needs in the relationship. We discussed the use of empathy and furthering techniques, which therapist modeled. We worked on delineating his needs and drive to spend time  together and discussed ways to communicate this, going forward. Chrystian was engaged and motivated during the session. Therapist validated and normalized Loel's feelings. Therapist provided supportive therapy.   Interventions:  Interpersonal & CBT  Diagnosis:  Adjustment disorder with anxiety  Treatment Plan:  Client Abilities/Strengths Zhion is intelligent, forthcoming, and motivated for change.   Support System: Family and friends.   Client Treatment Preferences Outpatient Therapy.    Client Statement of Needs Kareen would like to be challenged in therapy, work on managing symptoms, understand past and current life stressors, and call "bullshit" on me when I'm "dancing" around. Talley noted difficulty becoming introspective.   Treatment Level Weekly  Symptoms  Anxiety: irritability, restlessness, trouble relaxing (Status: maintained)   Goals:   Nasire experiences symptoms of anxiety with some depression.    Target Date: 02/01/2023 Frequency: Weekly  Progress: 0 Modality: individual    Therapist will provide referrals for additional resources as appropriate.  Therapist will provide psycho-education regarding Mercury's diagnosis and corresponding treatment approaches and interventions. Licensed Clinical Social Worker, [place], LCSW will support the patient's ability to achieve the goals identified. will employ CBT, BA, Problem-solving, Solution Focused, Mindfulness,  coping skills, & other evidenced-based practices will be used to promote progress towards healthy functioning to help manage decrease symptoms associated with his diagnosis.   Reduce overall level, frequency, and intensity of the feelings of anxiety evidenced by decreased overall symptoms from 6 to 7 days/week to 0 to 1 days/week per client report for at least 3 consecutive months. Verbally express understanding of the relationship between feelings of depression, anxiety  and their impact on thinking patterns and  behaviors. Verbalize an understanding of the role that distorted thinking plays in creating fears, excessive worry, and ruminations.   [name] [name] participated in the creation of the treatment plan)  [place] Irish, LCSW

## 2022-10-18 NOTE — Progress Notes (Signed)
[place] Phone: [phone] Subjective:   Matthew [name], am serving as a scribe for Dr. Hulan Saas.  I'm seeing this patient by the request  of:  Matthew Rail, MD  CC: Right knee pain, left shoulder pain  GEX:[id number]  09/14/2022 Surgical intervention and with a knee replacement and doing well at this time      Update 10/26/2022 Matthew [name] is a 66 y.o. male coming in with complaint of L shoulder and R knee pain. Patient states that he is feeling achy all over. Wants to know what an alternative to Meloxicam would be as this was helpful but he has high BP.   Knee is feeling good but wants to confirm       Past Medical History:  Diagnosis Date   Allergy    RHINITIS   Anemia    Arthritis    Bursitis of left foot 11/11/2016   Colonic polyp 09/2006   colonoscopy   Ganglion 11/02/2017   GERD (gastroesophageal reflux disease)    Golfer's elbow, right 09/25/2019   Gout    Heart beat abnormality 12/14/2017   Heart murmur    Herpes genitalis in men 02/14/2014   Hypertension    Osteitis pubis ([place]) 01/27/2016   Primary osteoarthritis of left knee 11/06/2017   Right groin pain 11/08/2018   SOB (shortness of breath)    per pt, may be coming from new medication   Subluxation of distal radial-ulnar joint 12/03/2019   Subluxation of tendon of long head of biceps 10/15/2016   Injected shoulder 12/19/2016   Subungual hematoma of foot, initial encounter 05/29/2018   SVT (supraventricular tachycardia) ([place]) 03/09/2020   Past Surgical History:  Procedure Laterality Date   COLONOSCOPY  09/2006   KNEE ARTHROSCOPY Left 2011   KNEE SURGERY Left 08/2018   scar tissue surgery   LUMBAR DISC SURGERY  1990   SVT ABLATION N/A 01/09/2020   Procedure: SVT ABLATION;  Surgeon: Matthew Haw, MD;  Location: [facility];  Service: Cardiovascular;  Laterality: N/A;   TOTAL HIP ARTHROPLASTY Left 07/20/2021    Procedure: LEFT TOTAL HIP ARTHROPLASTY ANTERIOR APPROACH;  Surgeon: Matthew Nakayama, MD;  Location: [facility];  Service: Orthopedics;  Laterality: Left;   TOTAL KNEE ARTHROPLASTY Left 11/06/2017   Procedure: TOTAL KNEE ARTHROPLASTY;  Surgeon: Matthew Leitz, MD;  Location: [facility];  Service: Orthopedics;  Laterality: Left;   Social History   Socioeconomic History   Marital status: Married    Spouse name: Not on file   Number of children: 1   Years of education: Not on file   Highest education level: Not on file  Occupational History   Not on file  Tobacco Use   Smoking status: Some Days    Types: Cigars   Smokeless tobacco: Never   Tobacco comments:    occasional cigar  Vaping Use   Vaping Use: Never used  Substance and Sexual Activity   Alcohol use: Yes    Alcohol/week: 3.0 standard drinks of alcohol    Types: 3 Standard drinks or equivalent per week    Comment: daily   Drug use: [name]   Sexual activity: Yes  Other Topics Concern   Not on file  Social History Narrative   Not on file   Social Determinants of Health   Financial Resource Strain: Not on file  Food Insecurity: Not on file  Transportation Needs: Not on file  Physical Activity: Not on file  Stress: Not on file  Social Connections: Not on file   Allergies  Allergen Reactions   Beta Adrenergic Blockers Other (See Comments)    Fatigue   Lisinopril Cough   Asa [Aspirin] Rash    High dosage ASA causes break out   Demerol Itching   Family History  Problem Relation Age of Onset   Stroke Father    Gout Father    Depression Brother    Dermatomyositis Daughter    Colon cancer Neg Hx    Rectal cancer Neg Hx    Esophageal cancer Neg Hx    Pancreatic cancer Neg Hx    Stomach cancer Neg Hx    Liver disease Neg Hx      Current Outpatient Medications (Cardiovascular):    diltiazem (CARDIZEM CD) 360 MG 24 hr capsule, TAKE 1 CAPSULE BY MOUTH EVERY DAY   flecainide (TAMBOCOR) 50 MG tablet, Take 50 mg by mouth 2 (two)  times daily.   rosuvastatin (CRESTOR) 20 MG tablet, TAKE 1 TABLET BY MOUTH EVERY DAY   losartan-hydrochlorothiazide (HYZAAR) 50-12.5 MG tablet, TAKE 1 TABLET BY MOUTH EVERY DAY  Current Outpatient Medications (Respiratory):    fexofenadine (ALLEGRA) 180 MG tablet, Take 180 mg by mouth at bedtime.    fluticasone (FLONASE) 50 MCG/ACT nasal spray, Place into the nose.   montelukast (SINGULAIR) 10 MG tablet, TAKE 1 TABLET BY MOUTH EVERYDAY AT BEDTIME  Current Outpatient Medications (Analgesics):    allopurinol (ZYLOPRIM) 300 MG tablet, TAKE 1 TABLET BY MOUTH EVERY DAY   Current Outpatient Medications (Other):    cholecalciferol (VITAMIN D) 1000 units tablet, Take 1,000 Units by mouth daily.   dicyclomine (BENTYL) 10 MG/5ML solution,    esomeprazole (NEXIUM) 40 MG capsule, TAKE 1 CAPSULE (40 MG TOTAL) BY MOUTH 2 (TWO) TIMES DAILY BEFORE A MEAL. TAKE ONE TABLET 30-60 MINUTES BEFORE BREAKFAST   gabapentin (NEURONTIN) 100 MG capsule, Take 4 capsules (400 mg total) by mouth at bedtime.   Misc Natural Products (TART CHERRY ADVANCED PO), Take 3,600 mg by mouth daily.   Multiple Vitamin (MULTIVITAMIN) tablet, Take 1 tablet by mouth daily.   TART CHERRY PO, Take by mouth.   traZODone (DESYREL) 50 MG tablet, TAKE 0.5-1 TABLETS BY MOUTH AT BEDTIME AS NEEDED FOR SLEEP.   triamcinolone (KENALOG) 0.1 % paste, SMARTSIG:1 TO TEETH Every Night   valACYclovir (VALTREX) 500 MG tablet, Take 500 mg by mouth daily.     Objective  Blood pressure 108/78, pulse 77, height 6' (1.829 m), weight 190 lb (86.2 kg), SpO2 98 %.   General: [name], dressed appropriately.  HEENT: Pupils equal, extraocular movements intact  Respiratory: Patient's speak in full sentences and does not appear short of breath  Cardiovascular: [name], non tender, [name]  Left shoulder is doing great at this time.  Full range of motion.  [name] significant weakness.   Right knee does have the replacement noted.  Great range of motion noted.  Trace effusion noted but nothing significant. Patient's nail does have a split ending in the right index finger.  [name] signs of any inflammation but does have some possible fungal infection at the most distal aspect.    Impression and Recommendations:     The above documentation has been reviewed and is accurate and complete Lyndal Pulley, DO

## 2022-10-26 ENCOUNTER — Ambulatory Visit: Payer: Self-pay

## 2022-10-26 ENCOUNTER — Ambulatory Visit (INDEPENDENT_AMBULATORY_CARE_PROVIDER_SITE_OTHER): Payer: 59 | Admitting: Family Medicine

## 2022-10-26 VITALS — BP 108/78 | HR 77 | Ht 72.0 in | Wt 190.0 lb

## 2022-10-26 DIAGNOSIS — G8929 Other chronic pain: Secondary | ICD-10-CM

## 2022-10-26 DIAGNOSIS — M25562 Pain in left knee: Secondary | ICD-10-CM | POA: Diagnosis not present

## 2022-10-26 DIAGNOSIS — B351 Tinea unguium: Secondary | ICD-10-CM

## 2022-10-26 NOTE — Assessment & Plan Note (Signed)
Discussed topical aspect.  Discussed which activities to do and which ones to avoid.  Increase activity slowly otherwise.  Follow-up again in 6 to 8 weeks

## 2022-10-26 NOTE — Patient Instructions (Signed)
Lamisil topically daily for 2 weeks  Can take meloxicam for 5 days always 2 days off See me in 5-6 weeks

## 2022-11-15 ENCOUNTER — Ambulatory Visit: Payer: 59 | Admitting: Psychology

## 2022-11-21 ENCOUNTER — Encounter: Payer: Self-pay | Admitting: Family Medicine

## 2022-11-23 NOTE — Progress Notes (Unsigned)
[place] Phone: [phone] Subjective:   [name], am serving as a scribe for Dr. Hulan Saas.  I'm seeing this patient by the request  of:  [name], MD  CC: Left hamstring pain  [id number]  JULIO ZAPPIA is a 67 y.o. male coming in with complaint of L hamstring pain for past 2 months. Patient states that he recently turned to get out of his car and felt a pull in muscle. Has pulled it a couple of times doing other activities. Pain in muscle belly. Patient notices clicking in the L knee as well with walking his dog. Pain will radiate down to the knee at times.       Past Medical History:  Diagnosis Date   Allergy    RHINITIS   Anemia    Arthritis    Bursitis of left foot 11/11/2016   Colonic polyp 09/2006   colonoscopy   Ganglion 11/02/2017   GERD (gastroesophageal reflux disease)    Golfer's elbow, right 09/25/2019   Gout    Heart beat abnormality 12/14/2017   Heart murmur    Herpes genitalis in men 02/14/2014   Hypertension    Osteitis pubis ([place]) 01/27/2016   Primary osteoarthritis of left knee 11/06/2017   Right groin pain 11/08/2018   SOB (shortness of breath)    per pt, may be coming from new medication   Subluxation of distal radial-ulnar joint 12/03/2019   Subluxation of tendon of long head of biceps 10/15/2016   Injected shoulder 12/19/2016   Subungual hematoma of foot, initial encounter 05/29/2018   SVT (supraventricular tachycardia) ([place]) 03/09/2020   Past Surgical History:  Procedure Laterality Date   COLONOSCOPY  09/2006   KNEE ARTHROSCOPY Left 2011   KNEE SURGERY Left 08/2018   scar tissue surgery   LUMBAR DISC SURGERY  1990   SVT ABLATION N/A 01/09/2020   Procedure: SVT ABLATION;  Surgeon: Constance Haw, MD;  Location: [facility];  Service: Cardiovascular;  Laterality: N/A;   TOTAL HIP ARTHROPLASTY Left 07/20/2021   Procedure: LEFT TOTAL HIP  ARTHROPLASTY ANTERIOR APPROACH;  Surgeon: [place] Nakayama, MD;  Location: [facility];  Service: Orthopedics;  Laterality: Left;   TOTAL KNEE ARTHROPLASTY Left 11/06/2017   Procedure: TOTAL KNEE ARTHROPLASTY;  Surgeon: Dorna Leitz, MD;  Location: [facility];  Service: Orthopedics;  Laterality: Left;   Social History   Socioeconomic History   Marital status: Married    Spouse name: Not on file   Number of children: 1   Years of education: Not on file   Highest education level: Not on file  Occupational History   Not on file  Tobacco Use   Smoking status: Some Days    Types: Cigars   Smokeless tobacco: Never   Tobacco comments:    occasional cigar  Vaping Use   Vaping Use: Never used  Substance and Sexual Activity   Alcohol use: Yes    Alcohol/week: 3.0 standard drinks of alcohol    Types: 3 Standard drinks or equivalent per week    Comment: daily   Drug use: No   Sexual activity: Yes  Other Topics Concern   Not on file  Social History Narrative   Not on file   Social Determinants of Health   Financial Resource Strain: Not on file  Food Insecurity: Not on file  Transportation Needs: Not on file  Physical Activity: Not on file  Stress: Not on file  Social Connections: Not on file   Allergies  Allergen Reactions   Beta Adrenergic Blockers Other (See Comments)    Fatigue   Lisinopril Cough   Asa [Aspirin] Rash    High dosage ASA causes break out   Demerol Itching   Family History  Problem Relation Age of Onset   Stroke Father    Gout Father    Depression Brother    Dermatomyositis Daughter    Colon cancer Neg Hx    Rectal cancer Neg Hx    Esophageal cancer Neg Hx    Pancreatic cancer Neg Hx    Stomach cancer Neg Hx    Liver disease Neg Hx      Current Outpatient Medications (Cardiovascular):    diltiazem (CARDIZEM CD) 360 MG 24 hr capsule, TAKE 1 CAPSULE BY MOUTH EVERY DAY   flecainide (TAMBOCOR) 50 MG tablet, Take 50 mg by mouth 2 (two) times daily.    nitroGLYCERIN (NITRO-DUR) 0.2 mg/hr patch, Apply 1/4 of a patch to skin once daily.   rosuvastatin (CRESTOR) 20 MG tablet, TAKE 1 TABLET BY MOUTH EVERY DAY   losartan-hydrochlorothiazide (HYZAAR) 50-12.5 MG tablet, TAKE 1 TABLET BY MOUTH EVERY DAY  Current Outpatient Medications (Respiratory):    fexofenadine (ALLEGRA) 180 MG tablet, Take 180 mg by mouth at bedtime.    fluticasone (FLONASE) 50 MCG/ACT nasal spray, Place into the nose.   montelukast (SINGULAIR) 10 MG tablet, TAKE 1 TABLET BY MOUTH EVERYDAY AT BEDTIME  Current Outpatient Medications (Analgesics):    allopurinol (ZYLOPRIM) 300 MG tablet, TAKE 1 TABLET BY MOUTH EVERY DAY   Current Outpatient Medications (Other):    cholecalciferol (VITAMIN D) 1000 units tablet, Take 1,000 Units by mouth daily.   dicyclomine (BENTYL) 10 MG/5ML solution,    esomeprazole (NEXIUM) 40 MG capsule, TAKE 1 CAPSULE (40 MG TOTAL) BY MOUTH 2 (TWO) TIMES DAILY BEFORE A MEAL. TAKE ONE TABLET 30-60 MINUTES BEFORE BREAKFAST   gabapentin (NEURONTIN) 100 MG capsule, Take 4 capsules (400 mg total) by mouth at bedtime.   Misc Natural Products (TART CHERRY ADVANCED PO), Take 3,600 mg by mouth daily.   Multiple Vitamin (MULTIVITAMIN) tablet, Take 1 tablet by mouth daily.   TART CHERRY PO, Take by mouth.   traZODone (DESYREL) 50 MG tablet, TAKE 0.5-1 TABLETS BY MOUTH AT BEDTIME AS NEEDED FOR SLEEP.   triamcinolone (KENALOG) 0.1 % paste, SMARTSIG:1 TO TEETH Every Night   valACYclovir (VALTREX) 500 MG tablet, Take 500 mg by mouth daily.   Reviewed prior external information including notes and imaging from  primary care provider As well as notes that were available from care everywhere and other healthcare systems.  Past medical history, social, surgical and family history all reviewed in electronic medical record.  No pertanent information unless stated regarding to the chief complaint.   Review of Systems:  No headache, visual changes, nausea, vomiting,  diarrhea, constipation, dizziness, abdominal pain, skin rash, fevers, chills, night sweats, weight loss, swollen lymph nodes, body aches, joint swelling, chest pain, shortness of breath, mood changes. POSITIVE muscle aches  Objective  Blood pressure 124/84, pulse 76, height 6' (1.829 m), weight 195 lb (88.5 kg).   General: No apparent distress alert and oriented x3 mood and affect normal, dressed appropriately.  HEENT: Pupils equal, extraocular movements intact  Respiratory: Patient's speak in full sentences and does not appear short of breath  Cardiovascular: No lower extremity edema, non tender, no erythema  On exam patient's left hamstring does have  some bruising noted approximately 4 cm from the popliteal area.  This is proximal.  Patient does have pain with resisted flexion of the knee.  No pain over the ischial tuberosity.   Limited muscular skeletal ultrasound was performed and interpreted by Hulan Saas, M  Limited ultrasound shows the patient does have some difficulty with hydration of the muscle.  Patient does have a hypoechoic change with increasing in neovascularization in Doppler flow in the muscle belly that is consistent with a subacute tear noted.  Patient does have some scarring from previous tearing in the series surrounding area. Impression: Subacute tear of the left hamstring   Impression and Recommendations:    The above documentation has been reviewed and is accurate and complete Lyndal Pulley, DO

## 2022-11-23 NOTE — Progress Notes (Deleted)
Matthew [name] [address] [place] Phone: [phone] Subjective:    I'm seeing this patient by the request  of:  [name], MD  CC:   [id number]  Matthew [name] is a 67 y.o. male coming in with complaint of hamstring pain        Past Medical History:  Diagnosis Date   Allergy    RHINITIS   Anemia    Arthritis    Bursitis of left foot 11/11/2016   Colonic polyp 09/2006   colonoscopy   Ganglion 11/02/2017   GERD (gastroesophageal reflux disease)    Golfer's elbow, right 09/25/2019   Gout    Heart beat abnormality 12/14/2017   Heart murmur    Herpes genitalis in men 02/14/2014   Hypertension    Osteitis pubis ([place]) 01/27/2016   Primary osteoarthritis of left knee 11/06/2017   Right groin pain 11/08/2018   SOB (shortness of breath)    per pt, may be coming from new medication   Subluxation of distal radial-ulnar joint 12/03/2019   Subluxation of tendon of long head of biceps 10/15/2016   Injected shoulder 12/19/2016   Subungual hematoma of foot, initial encounter 05/29/2018   SVT (supraventricular tachycardia) ([place]) 03/09/2020   Past Surgical History:  Procedure Laterality Date   COLONOSCOPY  09/2006   KNEE ARTHROSCOPY Left 2011   KNEE SURGERY Left 08/2018   scar tissue surgery   LUMBAR DISC SURGERY  1990   SVT ABLATION N/A 01/09/2020   Procedure: SVT ABLATION;  Surgeon: Constance Haw, MD;  Location: [facility];  Service: Cardiovascular;  Laterality: N/A;   TOTAL HIP ARTHROPLASTY Left 07/20/2021   Procedure: LEFT TOTAL HIP ARTHROPLASTY ANTERIOR APPROACH;  Surgeon: Melrose Nakayama, MD;  Location: [facility];  Service: Orthopedics;  Laterality: Left;   TOTAL KNEE ARTHROPLASTY Left 11/06/2017   Procedure: TOTAL KNEE ARTHROPLASTY;  Surgeon: Dorna Leitz, MD;  Location: [place];  Service: Orthopedics;  Laterality: Left;   Social History   Socioeconomic History   Marital status: Married    Spouse name: Not on file    Number of children: 1   Years of education: Not on file   Highest education level: Not on file  Occupational History   Not on file  Tobacco Use   Smoking status: Some Days    Types: Cigars   Smokeless tobacco: Never   Tobacco comments:    occasional cigar  Vaping Use   Vaping Use: Never used  Substance and Sexual Activity   Alcohol use: Yes    Alcohol/week: 3.0 standard drinks of alcohol    Types: 3 Standard drinks or equivalent per week    Comment: daily   Drug use: No   Sexual activity: Yes  Other Topics Concern   Not on file  Social History Narrative   Not on file   Social Determinants of Health   Financial Resource Strain: Not on file  Food Insecurity: Not on file  Transportation Needs: Not on file  Physical Activity: Not on file  Stress: Not on file  Social Connections: Not on file   Allergies  Allergen Reactions   Beta Adrenergic Blockers Other (See Comments)    Fatigue   Lisinopril Cough   Asa [Aspirin] Rash    High dosage ASA causes break out   Demerol Itching   Family History  Problem Relation Age of Onset   Stroke Father    Gout Father  Depression Brother    Dermatomyositis Daughter    Colon cancer Neg Hx    Rectal cancer Neg Hx    Esophageal cancer Neg Hx    Pancreatic cancer Neg Hx    Stomach cancer Neg Hx    Liver disease Neg Hx      Current Outpatient Medications (Cardiovascular):    diltiazem (CARDIZEM CD) 360 MG 24 hr capsule, TAKE 1 CAPSULE BY MOUTH EVERY DAY   flecainide (TAMBOCOR) 50 MG tablet, Take 50 mg by mouth 2 (two) times daily.   losartan-hydrochlorothiazide (HYZAAR) 50-12.5 MG tablet, TAKE 1 TABLET BY MOUTH EVERY DAY   rosuvastatin (CRESTOR) 20 MG tablet, TAKE 1 TABLET BY MOUTH EVERY DAY  Current Outpatient Medications (Respiratory):    fexofenadine (ALLEGRA) 180 MG tablet, Take 180 mg by mouth at bedtime.    fluticasone (FLONASE) 50 MCG/ACT nasal spray, Place into the nose.   montelukast (SINGULAIR) 10 MG tablet, TAKE  1 TABLET BY MOUTH EVERYDAY AT BEDTIME  Current Outpatient Medications (Analgesics):    allopurinol (ZYLOPRIM) 300 MG tablet, TAKE 1 TABLET BY MOUTH EVERY DAY   Current Outpatient Medications (Other):    cholecalciferol (VITAMIN D) 1000 units tablet, Take 1,000 Units by mouth daily.   dicyclomine (BENTYL) 10 MG/5ML solution,    esomeprazole (NEXIUM) 40 MG capsule, TAKE 1 CAPSULE (40 MG TOTAL) BY MOUTH 2 (TWO) TIMES DAILY BEFORE A MEAL. TAKE ONE TABLET 30-60 MINUTES BEFORE BREAKFAST   gabapentin (NEURONTIN) 100 MG capsule, Take 4 capsules (400 mg total) by mouth at bedtime.   Misc Natural Products (TART CHERRY ADVANCED PO), Take 3,600 mg by mouth daily.   Multiple Vitamin (MULTIVITAMIN) tablet, Take 1 tablet by mouth daily.   TART CHERRY PO, Take by mouth.   traZODone (DESYREL) 50 MG tablet, TAKE 0.5-1 TABLETS BY MOUTH AT BEDTIME AS NEEDED FOR SLEEP.   triamcinolone (KENALOG) 0.1 % paste, SMARTSIG:1 TO TEETH Every Night   valACYclovir (VALTREX) 500 MG tablet, Take 500 mg by mouth daily.   Reviewed prior external information including notes and imaging from  primary care provider As well as notes that were available from care everywhere and other healthcare systems.  Past medical history, social, surgical and family history all reviewed in electronic medical record.  No pertanent information unless stated regarding to the chief complaint.   Review of Systems:  No headache, visual changes, nausea, vomiting, diarrhea, constipation, dizziness, abdominal pain, skin rash, fevers, chills, night sweats, weight loss, swollen lymph nodes, body aches, joint swelling, chest pain, shortness of breath, mood changes. POSITIVE muscle aches  Objective  There were no vitals taken for this visit.   General: No apparent distress alert and oriented x3 mood and affect normal, dressed appropriately.  HEENT: Pupils equal, extraocular movements intact  Respiratory: Patient's speak in full sentences and does  not appear short of breath  Cardiovascular: No lower extremity edema, non tender, no erythema      Impression and Recommendations:

## 2022-11-24 ENCOUNTER — Ambulatory Visit: Payer: Self-pay

## 2022-11-24 ENCOUNTER — Ambulatory Visit (INDEPENDENT_AMBULATORY_CARE_PROVIDER_SITE_OTHER): Payer: 59 | Admitting: Family Medicine

## 2022-11-24 ENCOUNTER — Encounter: Payer: Self-pay | Admitting: Family Medicine

## 2022-11-24 VITALS — BP 124/84 | HR 76 | Ht 72.0 in | Wt 195.0 lb

## 2022-11-24 DIAGNOSIS — M76892 Other specified enthesopathies of left lower limb, excluding foot: Secondary | ICD-10-CM | POA: Diagnosis not present

## 2022-11-24 DIAGNOSIS — M25562 Pain in left knee: Secondary | ICD-10-CM | POA: Diagnosis not present

## 2022-11-24 MED ORDER — NITROGLYCERIN 0.2 MG/HR TD PT24: MEDICATED_PATCH | TRANSDERMAL | 0 refills | Status: AC

## 2022-11-24 NOTE — Assessment & Plan Note (Signed)
Partial tearing noted of the hamstring.  Discussed icing regimen and home exercises.  Discussed which activities to do and which ones to avoid, discussed nitroglycerin patches and warned of potential side effects.  Discussed compression in clinic.  Discussed that this could be worse with different activities especially deceleration.  Follow-up with me again in 6 to 8 weeks

## 2022-11-24 NOTE — Patient Instructions (Signed)
Heel lifts 1/8 of an inch Thigh compression sleeve daily for a week then w activity for 4 weeks Nitroglycerin Protocol   Apply 1/4 nitroglycerin patch to affected area daily.  Change position of patch within the affected area every 24 hours.  You may experience a headache during the first 1-2 weeks of using the patch, these should subside.  If you experience headaches after beginning nitroglycerin patch treatment, you may take your preferred over the counter pain reliever.  Another side effect of the nitroglycerin patch is skin irritation or rash related to patch adhesive.  Please notify our office if you develop more severe headaches or rash, and stop the patch.  Tendon healing with nitroglycerin patch may require 12 to 24 weeks depending on the extent of injury.  Men should not use if taking Viagra, Cialis, or Levitra.   Do not use if you have migraines or rosacea.  Exercises See me in 4-6 weeks

## 2022-11-25 ENCOUNTER — Ambulatory Visit: Payer: 59 | Admitting: Family Medicine

## 2022-11-30 ENCOUNTER — Ambulatory Visit (INDEPENDENT_AMBULATORY_CARE_PROVIDER_SITE_OTHER): Payer: 59 | Admitting: Psychology

## 2022-11-30 DIAGNOSIS — F4322 Adjustment disorder with anxiety: Secondary | ICD-10-CM | POA: Diagnosis not present

## 2022-11-30 NOTE — Progress Notes (Signed)
Matthew [name]/Therapist Progress Note  Patient ID: LOY MCCARTT, MRN: [id number]   Date: 11/30/22  Time Spent: 10:02 am  -  10:48 am : 46 Minutes  Treatment Type: Individual Therapy.  Reported Symptoms: Anxiety  Mental Status Exam: Appearance:  Neat and Well Groomed     Behavior: Appropriate  Motor: Normal  Speech/Language:  Clear and Coherent  Affect: Congruent  Mood: normal  Thought process: normal  Thought content:   WNL  Sensory/Perceptual disturbances:   WNL  Orientation: oriented to person, place, time/date, and situation  Attention: Good  Concentration: Good  Memory: WNL  Fund of knowledge:  Good  Insight:   Good  Judgment:  Good  Impulse Control: Good   Risk Assessment: Danger to Self:  No Self-injurious Behavior: No Danger to Others: No Duty to Warn:no Physical Aggression / Violence:No  Access to Firearms a concern: No  Gang Involvement:No   Subjective:   Matthew [name] participated from home, via video and consented to treatment. Therapist participated from home office. We met online due to [place] pandemic. Matthew [name] reviewed the events of the past week. He noted listening a song that resonated with him and noted feeling, in the past, oversized self-importance and absolutely no self-worth. He noted the cons of this approach and noted being "a pain in the ass to be around" during that time. He noted a need to be relevant and noted the no self-worth being under the surface. He noted difficulty identifying what he would talk about in lieu of discussing business. He noted this being difficult to manage this when feeling depressed or down. He noted behavioral and environmental triggers including poor sleep, worsening mood, and higher level of stress. He noted often engaging in this behavior and noted a need to feel like "I am contributing or doing something unique". He noted a need to have his life be unique. He noted a need to have people to "care  about me". We worked on identifying a possible core belief "I am inadequate/I am worthless". He noted his mother's treatment of him, both explicitly and implicitly, poorly. We worked on identifying areas of pride. Matthew [name] noted managing Matthew [name]'s estate, mother's ex-partner, who now lives in [place] as her health is declining. He noted feeling positive self-worth in this area and in relation with his wife, daughter, brother in-law, business ventures, and the family pet.  Therapist provided exercise "who am I", to be completed between sessions. Matthew [name] was engaged and motivated during the session. Therapist validated and normalized [name]'s feelings. Therapist provided supportive therapy.   Interventions:  Interpersonal & CBT  Diagnosis:  Adjustment disorder with anxiety  Treatment Plan:  Client Abilities/Strengths Matthew [name] is intelligent, forthcoming, and motivated for change.   Support System: Family and friends.   Client Treatment Preferences Outpatient Therapy.    Client Statement of Needs Matthew [name] would like to be challenged in therapy, work on managing symptoms, understand past and current life stressors, and call "bullshit" on me when I'm "dancing" around. Matthew [name] noted difficulty becoming introspective.   Treatment Level Weekly  Symptoms  Anxiety: irritability, restlessness, trouble relaxing (Status: maintained)   Goals:   Matthew [name] experiences symptoms of anxiety with some depression.    Target Date: 02/01/2023 Frequency: Weekly  Progress: 0 Modality: individual    Therapist will provide referrals for additional resources as appropriate.  Therapist will provide psycho-education regarding Matthew [name]'s diagnosis and corresponding treatment approaches and interventions. Licensed Clinical Social Worker, [place], LCSW will support the patient's ability  to achieve the goals identified. will employ CBT, BA, Problem-solving, Solution Focused, Mindfulness,  coping skills, & other evidenced-based  practices will be used to promote progress towards healthy functioning to help manage decrease symptoms associated with his diagnosis.   Reduce overall level, frequency, and intensity of the feelings of anxiety evidenced by decreased overall symptoms from 6 to 7 days/week to 0 to 1 days/week per client report for at least 3 consecutive months. Verbally express understanding of the relationship between feelings of depression, anxiety and their impact on thinking patterns and behaviors. Verbalize an understanding of the role that distorted thinking plays in creating fears, excessive worry, and ruminations.   Matthew [name] participated in the creation of the treatment plan)  Buena Irish, LCSW

## 2022-12-06 ENCOUNTER — Ambulatory Visit: Payer: 59 | Admitting: Psychology

## 2022-12-06 ENCOUNTER — Ambulatory Visit: Payer: 59 | Admitting: Family Medicine

## 2022-12-23 NOTE — Progress Notes (Unsigned)
Matthew [name] [place] [address] [place] Phone: [phone] Subjective:   [name], am serving as a scribe for Dr. Hulan Saas.  I'm seeing this patient by the request  of:  [name], MD  CC: Left arm pain   [id number]  11/24/2022 Partial tearing noted of the hamstring. Discussed icing regimen and home exercises. Discussed which activities to do and which ones to avoid, discussed nitroglycerin patches and warned of potential side effects. Discussed compression in clinic. Discussed that this could be worse with different activities especially deceleration. Follow-up with me again in 6 to 8 weeks   Update 12/27/2022 Matthew [name] is a 67 y.o. male coming in with complaint of L thigh pain. Patient states no pain in hamstring. Left shoulder concerns. Numbness in fingers on L side that wakes him at night with a slight ache.     Past Medical History:  Diagnosis Date   Allergy    RHINITIS   Anemia    Arthritis    Bursitis of left foot 11/11/2016   Colonic polyp 09/2006   colonoscopy   Ganglion 11/02/2017   GERD (gastroesophageal reflux disease)    Golfer's elbow, right 09/25/2019   Gout    Heart beat abnormality 12/14/2017   Heart murmur    Herpes genitalis in men 02/14/2014   Hypertension    Osteitis pubis ([place]) 01/27/2016   Primary osteoarthritis of left knee 11/06/2017   Right groin pain 11/08/2018   SOB (shortness of breath)    per pt, may be coming from new medication   Subluxation of distal radial-ulnar joint 12/03/2019   Subluxation of tendon of long head of biceps 10/15/2016   Injected shoulder 12/19/2016   Subungual hematoma of foot, initial encounter 05/29/2018   SVT (supraventricular tachycardia) 03/09/2020   Past Surgical History:  Procedure Laterality Date   COLONOSCOPY  09/2006   KNEE ARTHROSCOPY Left 2011   KNEE SURGERY Left 08/2018   scar tissue surgery   LUMBAR DISC SURGERY  1990   SVT ABLATION N/A  01/09/2020   Procedure: SVT ABLATION;  Surgeon: Constance Haw, MD;  Location: [facility];  Service: Cardiovascular;  Laterality: N/A;   TOTAL HIP ARTHROPLASTY Left 07/20/2021   Procedure: LEFT TOTAL HIP ARTHROPLASTY ANTERIOR APPROACH;  Surgeon: Melrose Nakayama, MD;  Location: [facility];  Service: Orthopedics;  Laterality: Left;   TOTAL KNEE ARTHROPLASTY Left 11/06/2017   Procedure: TOTAL KNEE ARTHROPLASTY;  Surgeon: Dorna Leitz, MD;  Location: [place];  Service: Orthopedics;  Laterality: Left;   Social History   Socioeconomic History   Marital status: Married    Spouse name: Not on file   Number of children: 1   Years of education: Not on file   Highest education level: Not on file  Occupational History   Not on file  Tobacco Use   Smoking status: Some Days    Types: Cigars   Smokeless tobacco: Never   Tobacco comments:    occasional cigar  Vaping Use   Vaping Use: Never used  Substance and Sexual Activity   Alcohol use: Yes    Alcohol/week: 3.0 standard drinks of alcohol    Types: 3 Standard drinks or equivalent per week    Comment: daily   Drug use: No   Sexual activity: Yes  Other Topics Concern   Not on file  Social History Narrative   Not on file   Social Determinants of Health   Financial  Resource Strain: Not on file  Food Insecurity: Not on file  Transportation Needs: Not on file  Physical Activity: Not on file  Stress: Not on file  Social Connections: Not on file   Allergies  Allergen Reactions   Beta Adrenergic Blockers Other (See Comments)    Fatigue   Lisinopril Cough   Asa [Aspirin] Rash    High dosage ASA causes break out   Demerol Itching   Family History  Problem Relation Age of Onset   Stroke Father    Gout Father    Depression Brother    Dermatomyositis Daughter    Colon cancer Neg Hx    Rectal cancer Neg Hx    Esophageal cancer Neg Hx    Pancreatic cancer Neg Hx    Stomach cancer Neg Hx    Liver disease Neg Hx      Current  Outpatient Medications (Cardiovascular):    diltiazem (CARDIZEM CD) 360 MG 24 hr capsule, TAKE 1 CAPSULE BY MOUTH EVERY DAY   flecainide (TAMBOCOR) 50 MG tablet, Take 50 mg by mouth 2 (two) times daily.   losartan-hydrochlorothiazide (HYZAAR) 50-12.5 MG tablet, TAKE 1 TABLET BY MOUTH EVERY DAY   nitroGLYCERIN (NITRO-DUR) 0.2 mg/hr patch, Apply 1/4 of a patch to skin once daily.   rosuvastatin (CRESTOR) 20 MG tablet, TAKE 1 TABLET BY MOUTH EVERY DAY  Current Outpatient Medications (Respiratory):    fexofenadine (ALLEGRA) 180 MG tablet, Take 180 mg by mouth at bedtime.    fluticasone (FLONASE) 50 MCG/ACT nasal spray, Place into the nose.   montelukast (SINGULAIR) 10 MG tablet, TAKE 1 TABLET BY MOUTH EVERYDAY AT BEDTIME  Current Outpatient Medications (Analgesics):    allopurinol (ZYLOPRIM) 300 MG tablet, TAKE 1 TABLET BY MOUTH EVERY DAY   Current Outpatient Medications (Other):    cholecalciferol (VITAMIN D) 1000 units tablet, Take 1,000 Units by mouth daily.   dicyclomine (BENTYL) 10 MG/5ML solution,    esomeprazole (NEXIUM) 40 MG capsule, TAKE 1 CAPSULE (40 MG TOTAL) BY MOUTH 2 (TWO) TIMES DAILY BEFORE A MEAL. TAKE ONE TABLET 30-60 MINUTES BEFORE BREAKFAST   gabapentin (NEURONTIN) 100 MG capsule, Take 4 capsules (400 mg total) by mouth at bedtime.   Misc Natural Products (TART CHERRY ADVANCED PO), Take 3,600 mg by mouth daily.   Multiple Vitamin (MULTIVITAMIN) tablet, Take 1 tablet by mouth daily.   TART CHERRY PO, Take by mouth.   traZODone (DESYREL) 50 MG tablet, TAKE 0.5-1 TABLETS BY MOUTH AT BEDTIME AS NEEDED FOR SLEEP.   triamcinolone (KENALOG) 0.1 % paste, SMARTSIG:1 TO TEETH Every Night   valACYclovir (VALTREX) 500 MG tablet, Take 500 mg by mouth daily.   Reviewed prior external information including notes and imaging from  primary care provider As well as notes that were available from care everywhere and other healthcare systems.  Past medical history, social, surgical and  family history all reviewed in electronic medical record.  No pertanent information unless stated regarding to the chief complaint.   Review of Systems:  No headache, visual changes, nausea, vomiting, diarrhea, constipation, dizziness, abdominal pain, skin rash, fevers, chills, night sweats, weight loss, swollen lymph nodes, body aches, joint swelling, chest pain, shortness of breath, mood changes. POSITIVE muscle aches  Objective  Blood pressure 126/74, pulse 73, height 6' (1.829 m), weight 195 lb (88.5 kg), SpO2 98 %.   General: No apparent distress alert and oriented x3 mood and affect normal, dressed appropriately.  HEENT: Pupils equal, extraocular movements intact  Respiratory: Patient's speak in  full sentences and does not appear short of breath  Cardiovascular: No lower extremity edema, non tender, no erythema  Left arm does have some good range of motion noted.  Very mild impingement of the shoulder.  Positive Tinel's noted.  Limited muscular skeletal ultrasound was performed and interpreted by Hulan Saas, M  Limited ultrasound of patient's wrist shows the patient does have significant dilation and hypoechoic changes of the carpal tunnel with significant tightness noted of the retinaculum that is consistent with carpal tunnel syndrome. Impression: Carpal tunnel syndrome    Impression and Recommendations:     The above documentation has been reviewed and is accurate and complete Lyndal Pulley, DO

## 2022-12-27 ENCOUNTER — Encounter: Payer: Self-pay | Admitting: Family Medicine

## 2022-12-27 ENCOUNTER — Ambulatory Visit: Payer: Self-pay

## 2022-12-27 ENCOUNTER — Ambulatory Visit (INDEPENDENT_AMBULATORY_CARE_PROVIDER_SITE_OTHER): Payer: Medicare HMO | Admitting: Family Medicine

## 2022-12-27 VITALS — BP 126/74 | HR 73 | Ht 72.0 in | Wt 195.0 lb

## 2022-12-27 DIAGNOSIS — M76892 Other specified enthesopathies of left lower limb, excluding foot: Secondary | ICD-10-CM

## 2022-12-27 DIAGNOSIS — G5602 Carpal tunnel syndrome, left upper limb: Secondary | ICD-10-CM | POA: Diagnosis not present

## 2022-12-27 NOTE — Patient Instructions (Signed)
Good to see you Left wrist brace without thumb given Wrist exercises given for carpal tunnel See me again in 7-8 weeks

## 2022-12-27 NOTE — Assessment & Plan Note (Signed)
We discussed the anatomy involved, and that carpal tunnel syndrome primarily involves the median nerve, and this typically affects digits one through 3.  ° °We also discussed that mild cases of carpal tunnel syndrome are often improved with night splints.  If symptoms persist it is very reasonable to consider a carpal tunnel injection.  ° °If the patient does have moderate to severe carpal tunnel syndrome based on NCV, then it is certainly reasonable to consider surgical consultation for definitive management possible carpal tunnel release.  We also discussed his severe carpal tunnel syndrome can lead to permanent nerve impairment even if released. At this point, the patient like to proceed conservatively. ° °

## 2022-12-28 ENCOUNTER — Ambulatory Visit: Payer: 59 | Admitting: Psychology

## 2023-01-01 ENCOUNTER — Other Ambulatory Visit: Payer: Self-pay | Admitting: Family Medicine

## 2023-02-09 ENCOUNTER — Encounter: Payer: Self-pay | Admitting: Family Medicine

## 2023-02-15 ENCOUNTER — Ambulatory Visit: Payer: 59 | Admitting: Family Medicine

## 2023-02-20 ENCOUNTER — Other Ambulatory Visit: Payer: Self-pay | Admitting: Family Medicine

## 2023-03-01 ENCOUNTER — Other Ambulatory Visit: Payer: Self-pay | Admitting: Family Medicine

## 2023-03-21 ENCOUNTER — Other Ambulatory Visit: Payer: Self-pay | Admitting: Family Medicine

## 2023-03-21 DIAGNOSIS — M109 Gout, unspecified: Secondary | ICD-10-CM

## 2023-03-23 ENCOUNTER — Other Ambulatory Visit: Payer: Self-pay | Admitting: Family Medicine

## 2023-05-10 ENCOUNTER — Encounter: Payer: Self-pay | Admitting: Family Medicine

## 2023-05-22 ENCOUNTER — Telehealth: Payer: Self-pay | Admitting: *Deleted

## 2023-05-22 NOTE — Telephone Encounter (Signed)
I connected with Matthew [name] on 7/22 at 1403 by telephone and verified that I am speaking with the correct person using two identifiers. According to the patient's chart they are due for follow up  with [facility]. Pt declined to set up appt at this time as a few weeks ago he had a check up at [facility]. Nothing further was needed at the end of our conversation.

## 2023-06-17 ENCOUNTER — Ambulatory Visit (HOSPITAL_COMMUNITY): Payer: Medicare HMO

## 2023-06-17 ENCOUNTER — Ambulatory Visit (HOSPITAL_COMMUNITY)
Admission: EM | Admit: 2023-06-17 | Discharge: 2023-06-17 | Disposition: A | Discharge status: Home / Self Care | Payer: Medicare HMO

## 2023-06-17 ENCOUNTER — Encounter (HOSPITAL_COMMUNITY): Payer: Self-pay | Admitting: *Deleted

## 2023-06-17 DIAGNOSIS — L089 Local infection of the skin and subcutaneous tissue, unspecified: Secondary | ICD-10-CM

## 2023-06-17 DIAGNOSIS — M79672 Pain in left foot: Secondary | ICD-10-CM | POA: Diagnosis not present

## 2023-06-17 MED ORDER — LIDOCAINE HCL (PF) 1 % IJ SOLN
INTRAMUSCULAR | Status: AC
  Filled 2023-06-17: qty 2

## 2023-06-17 MED ORDER — CEFTRIAXONE SODIUM 1 G IJ SOLR
1.0000 g | Freq: Once | INTRAMUSCULAR | Status: AC
  Administered 2023-06-17: 1 g via INTRAMUSCULAR

## 2023-06-17 MED ORDER — DOXYCYCLINE HYCLATE 100 MG PO CAPS: 100.0000 mg | ORAL_CAPSULE | Freq: Two times a day (BID) | ORAL | 0 refills | Status: DC

## 2023-06-17 MED ORDER — CEFTRIAXONE SODIUM 1 G IJ SOLR
INTRAMUSCULAR | Status: AC
  Filled 2023-06-17: qty 10

## 2023-06-17 NOTE — ED Triage Notes (Signed)
Pt states he thinks he cut the bottom of his left foot 2 weeks ago on a seashell. He now can hardly walk on it and thinks it might be infected. He states his TDAP is current with [facility] but he will update if needed.

## 2023-06-17 NOTE — ED Provider Notes (Signed)
[facility]    CSN: [id number] Arrival date & time: 06/17/23  1515      History   Chief Complaint Chief Complaint  Patient presents with   Foot Pain    HPI Matthew [name] is a 67 y.o. male.   Patient presents today with a 2-week history of painful lesion on his left foot.  Reports that he injured it while he was [place] the [place].  He has had ongoing pain and swelling in this area that is worse with attempted ambulation.  He denies any fever, nausea, vomiting.  Reports pain is rated 9 on a 0-10 pain scale, localized to affected area, described as aching, no aggravating or relieving factors identified.  He is up-to-date on his tetanus.  Denies any recent antibiotics.  He denies history of recurrent skin infections including MRSA.  He denies any history of diabetes or immunosuppression.    Past Medical History:  Diagnosis Date   Allergy    RHINITIS   Anemia    Arthritis    Bursitis of left foot 11/11/2016   Colonic polyp 09/2006   colonoscopy   Ganglion 11/02/2017   GERD (gastroesophageal reflux disease)    Golfer's elbow, right 09/25/2019   Gout    Heart beat abnormality 12/14/2017   Heart murmur    Herpes genitalis in men 02/14/2014   Hypertension    Osteitis pubis (HCC) 01/27/2016   Primary osteoarthritis of left knee 11/06/2017   Right groin pain 11/08/2018   SOB (shortness of breath)    per pt, may be coming from new medication   Subluxation of distal radial-ulnar joint 12/03/2019   Subluxation of tendon of long head of biceps 10/15/2016   Injected shoulder 12/19/2016   Subungual hematoma of foot, initial encounter 05/29/2018   SVT (supraventricular tachycardia) 03/09/2020    Patient Active Problem List   Diagnosis Date Noted   Left carpal tunnel syndrome 12/27/2022   Fungal nail infection 10/26/2022   Food poisoning 09/14/2022   AC (acromioclavicular) arthritis 02/03/2022   Degenerative joint disease of knee, right 01/07/2022   Numbness  and tingling of left thumb 09/07/2021   Primary localized osteoarthritis of left hip 07/20/2021   Prediabetes 07/07/2021   Pre-op evaluation 07/05/2021   Severe left groin pain 05/18/2021   Lateral epicondylitis, left elbow 04/15/2021   Left shoulder pain 02/24/2021   Pharyngeal dysphagia, mild 01/17/2021   Left-sided nosebleed 12/21/2020   Acquired trigger finger of right middle finger 11/12/2020   Sinusitis, chronic 08/18/2020   History of atrial fibrillation 05/25/2020   SVT (supraventricular tachycardia) 03/09/2020   Subluxation of distal radial-ulnar joint 12/03/2019   Golfer's elbow, right 09/25/2019   Former smoker 03/07/2019   Right groin pain 11/08/2018   Chronic knee pain after total replacement of left knee joint 06/04/2018   Subungual hematoma of foot, initial encounter 05/29/2018   Pain and swelling of left knee 05/29/2018   Heart beat abnormality 12/14/2017   [name] 11/15/2017   Primary osteoarthritis of left knee 11/06/2017   Lipoma of torso 11/02/2017   Ganglion 11/02/2017   Sleep deficient 03/22/2017   Bursitis of left foot 11/11/2016   Subluxation of tendon of long head of biceps 10/15/2016   Leg cramps, sleep related 10/15/2016   Hamstring tendinitis of left thigh 07/26/2016   Gout 07/26/2016   Osteitis pubis (HCC) 01/27/2016   Neck tightness 08/12/2015   Nonallopathic lesion of cervical region 08/12/2015   Nonallopathic lesion of  thoracic region 08/12/2015   Nonallopathic lesion-rib cage 08/12/2015   ACE-inhibitor cough 08/12/2015   GERD (gastroesophageal reflux disease) 08/01/2014   Neck pain 03/03/2014   Hypertension 02/14/2014   Allergic rhinitis 02/14/2014   Herpes genitalis in men 02/14/2014   Personal history of colonic polyps 05/06/2011   Pes planus 04/30/2008    Past Surgical History:  Procedure Laterality Date   COLONOSCOPY  09/2006   KNEE ARTHROSCOPY Left 2011   KNEE SURGERY Left 08/2018   scar tissue surgery   LUMBAR DISC  SURGERY  1990   SVT ABLATION N/A 01/09/2020   Procedure: SVT ABLATION;  Surgeon: Regan Lemming, MD;  [facility];  Service: Cardiovascular;  Laterality: N/A;   TOTAL HIP ARTHROPLASTY Left 07/20/2021   Procedure: LEFT TOTAL HIP ARTHROPLASTY ANTERIOR APPROACH;  Surgeon: Marcene Corning, MD;  Location: [facility];  Service: Orthopedics;  Laterality: Left;   TOTAL KNEE ARTHROPLASTY Left 11/06/2017   Procedure: TOTAL KNEE ARTHROPLASTY;  Surgeon: Jodi Geralds, MD;  Location: [facility];  Service: Orthopedics;  Laterality: Left;       Home Medications    Prior to Admission medications   Medication Sig Start Date End Date Taking? Authorizing Provider  allopurinol (ZYLOPRIM) 300 MG tablet TAKE 1 TABLET BY MOUTH EVERY DAY 03/21/23  Yes Antoine Primas M, DO  cholecalciferol (VITAMIN D) 1000 units tablet Take 1,000 Units by mouth daily.   Yes [provider]  dicyclomine (BENTYL) 10 MG/5ML solution  12/17/20  Yes [provider]  diltiazem (CARDIZEM CD) 360 MG 24 hr capsule TAKE 1 CAPSULE BY MOUTH EVERY DAY 02/15/22  Yes Camnitz, Andree Coss, MD  doxycycline (VIBRAMYCIN) 100 MG capsule Take 1 capsule (100 mg total) by mouth 2 (two) times daily. 06/17/23  Yes Marlaina Coburn K, PA-C  esomeprazole (NEXIUM) 40 MG capsule TAKE 1 CAPSULE (40 MG TOTAL) BY MOUTH 2 (TWO) TIMES DAILY BEFORE A MEAL. TAKE ONE TABLET 30-60 MINUTES BEFORE BREAKFAST 02/18/21  Yes Unk Lightning, PA  fexofenadine (ALLEGRA) 180 MG tablet Take 180 mg by mouth at bedtime.    Yes [provider]  flecainide (TAMBOCOR) 50 MG tablet Take 50 mg by mouth 2 (two) times daily. 06/12/21  Yes [provider]  fluticasone (FLONASE) 50 MCG/ACT nasal spray Place into the nose.   Yes [provider]  hydrochlorothiazide (HYDRODIURIL) 25 MG tablet Take 25 mg by mouth daily.   Yes [provider]  losartan (COZAAR) 100 MG tablet Take 100 mg by mouth daily.   Yes [provider]   meloxicam (MOBIC) 15 MG tablet TAKE 1 TABLET BY MOUTH EVERY DAY 03/23/23  Yes Antoine Primas M, DO  montelukast (SINGULAIR) 10 MG tablet TAKE 1 TABLET BY MOUTH EVERYDAY AT BEDTIME 01/02/23  Yes Judi Saa, DO  Multiple Vitamin (MULTIVITAMIN) tablet Take 1 tablet by mouth daily.   Yes [provider]  rosuvastatin (CRESTOR) 20 MG tablet TAKE 1 TABLET BY MOUTH EVERY DAY 07/19/21  Yes Crenshaw, Madolyn Frieze, MD  TART CHERRY PO Take by mouth.   Yes [provider]  traZODone (DESYREL) 50 MG tablet TAKE 1/2 TO 1 TABLET BY MOUTH AT BEDTIME AS NEEDED FOR SLEEP 03/01/23  Yes Antoine Primas M, DO  nitroGLYCERIN (NITRO-DUR) 0.2 mg/hr patch Apply 1/4 of a patch to skin once daily. 11/24/22   Judi Saa, DO  triamcinolone (KENALOG) 0.1 % paste SMARTSIG:1 TO TEETH Every Night 11/18/20   [provider]  valACYclovir (VALTREX) 500 MG  tablet Take 500 mg by mouth daily. 11/23/20   [provider]    Family History Family History  Problem Relation Age of Onset   Stroke Father    Gout Father    Depression Brother    Dermatomyositis Daughter    Colon cancer Neg Hx    Rectal cancer Neg Hx    Esophageal cancer Neg Hx    Pancreatic cancer Neg Hx    Stomach cancer Neg Hx    Liver disease Neg Hx     Social History Social History   Tobacco Use   Smoking status: Some Days    Types: Cigars   Smokeless tobacco: Never   Tobacco comments:    occasional cigar  Vaping Use   Vaping status: Never Used  Substance Use Topics   Alcohol use: Yes    Alcohol/week: 3.0 standard drinks of alcohol    Types: 3 Standard drinks or equivalent per week    Comment: daily   Drug use: No     Allergies   Beta adrenergic blockers, Lisinopril, Asa [aspirin], and Demerol   Review of Systems Review of Systems  Constitutional:  Positive for activity change. Negative for appetite change, fatigue and fever.  Gastrointestinal:  Negative for abdominal pain, diarrhea, nausea and vomiting.   Musculoskeletal:  Positive for arthralgias. Negative for myalgias.  Skin:  Positive for wound. Negative for color change.  Neurological:  Negative for weakness and numbness.     Physical Exam Triage Vital Signs ED Triage Vitals  Encounter Vitals Group     BP 06/17/23 1553 129/81     Systolic BP Percentile --      Diastolic BP Percentile --      Pulse Rate 06/17/23 1553 68     Resp 06/17/23 1553 18     Temp 06/17/23 1553 98.2 F (36.8 C)     Temp Source 06/17/23 1553 Oral     SpO2 06/17/23 1553 94 %     Weight --      Height --      Head Circumference --      Peak Flow --      Pain Score 06/17/23 1550 9     Pain Loc --      Pain Education --      Exclude from Growth Chart --    No data found.  Updated Vital Signs BP 129/81 (BP Location: Left Arm)   Pulse 68   Temp 98.2 F (36.8 C) (Oral)   Resp 18   SpO2 94%   Visual Acuity Right Eye Distance:   Left Eye Distance:   Bilateral Distance:    Right Eye Near:   Left Eye Near:    Bilateral Near:     Physical Exam Vitals reviewed.  Constitutional:      General: He is awake.     Appearance: Normal appearance. He is well-developed. He is not ill-appearing.     Comments: Very pleasant male appears stated age in no acute distress sitting topical exam room  HENT:     Head: Normocephalic and atraumatic.     Mouth/Throat:     Pharynx: Uvula midline. No oropharyngeal exudate or posterior oropharyngeal erythema.  Cardiovascular:     Rate and Rhythm: Normal rate and regular rhythm.     Heart sounds: Normal heart sounds, S1 normal and S2 normal. No murmur heard. Pulmonary:     Effort: Pulmonary effort is normal.     Breath sounds: Normal breath sounds. No  stridor. No wheezing, rhonchi or rales.     Comments: Clear to auscultation bilaterally Musculoskeletal:     Left foot: Normal range of motion.       Feet:  Feet:     Left foot:     Protective Sensation: 10 sites tested.  10 sites sensed.     Skin integrity: No  ulcer, blister or skin breakdown.     Toenail Condition: Left toenails are normal.     Comments: Painful nodule noted at fifth MTP with associated serous drainage.  Normal active range of motion of toes and ankle.  Foot is neurovascularly intact. Neurological:     Mental Status: He is alert.  Psychiatric:        Behavior: Behavior is cooperative.      UC Treatments / Results  Labs (all labs ordered are listed, but only abnormal results are displayed) Labs Reviewed - No data to display  EKG   Radiology DG Foot Complete Left  Result Date: 06/17/2023 CLINICAL DATA:  Possible fifth toe infection. EXAM: LEFT FOOT - COMPLETE 3+ VIEW COMPARISON:  None Available. FINDINGS: There is moderate soft tissue swelling adjacent the fifth MTP joint. No evidence of fracture or dislocation no significant bone destruction to suggest osteomyelitis. No air within the soft tissues. Minimal degenerative changes over the midfoot. IMPRESSION: 1. No acute bony findings. 2. Moderate soft tissue swelling adjacent the fifth MTP joint. Electronically Signed   By: Elberta Fortis M.D.   On: 06/17/2023 16:56    Procedures Procedures (including critical care time)  Medications Ordered in UC Medications  cefTRIAXone (ROCEPHIN) injection 1 g (has no administration in time range)    Initial Impression / Assessment and Plan / UC Course  I have reviewed the triage vital signs and the nursing notes.  Pertinent labs & imaging results that were available during my care of the patient were reviewed by me and considered in my medical decision making (see chart for details).     Patient is well-appearing, afebrile, nontoxic, nontachycardic.  X-ray was obtained given several weeks of worsening pain and swelling which showed no acute osseous abnormality including signs of osteomyelitis.  Concern for vibrio infection given exposure to warm salt water at the time of injury.  He was given 1 g of Rocephin in clinic and started  doxycycline 100 mg twice daily for 10 days.  Discussed that doxycycline can make him sensitive to the sun so he is to avoid prolonged sun exposure while on this medication.  He is to keep area clean and use over-the-counter analgesics as needed.  Recommend close follow-up with podiatry and he was given contact information for local provider with instruction to call to schedule an appointment.  Discussed that if he has any worsening or changing symptoms including enlarging lesion, fever, increasing pain, weakness, nausea, vomiting he needs to be seen immediately.  Strict return precautions given.  Final Clinical Impressions(s) / UC Diagnoses   Final diagnoses:  Foot infection     Discharge Instructions      Your x-ray was reassuring with no evidence of infection going into the bone.  We gave you an injection of antibiotics today and I would like you to start doxycycline 100 mg twice daily for 10 days.  Stay out of the sun while on this medication as it can cause you to have a bad sunburn.  Keep this area clean with soap and water.  Follow-up with podiatry if your symptoms not significantly proved within a  few days.  If anything worsens and it becomes more painful, more swollen, you develop fever, nausea, vomiting you need to be seen immediately.     ED Prescriptions     Medication Sig Dispense Auth. Provider   doxycycline (VIBRAMYCIN) 100 MG capsule Take 1 capsule (100 mg total) by mouth 2 (two) times daily. 20 capsule Madaleine Simmon, Noberto Retort, PA-C      PDMP not reviewed this encounter.   Jeani Hawking, PA-C 06/17/23 1707

## 2023-06-17 NOTE — Discharge Instructions (Signed)
Your x-ray was reassuring with no evidence of infection going into the bone.  We gave you an injection of antibiotics today and I would like you to start doxycycline 100 mg twice daily for 10 days.  Stay out of the sun while on this medication as it can cause you to have a bad sunburn.  Keep this area clean with soap and water.  Follow-up with podiatry if your symptoms not significantly proved within a few days.  If anything worsens and it becomes more painful, more swollen, you develop fever, nausea, vomiting you need to be seen immediately.

## 2023-07-11 ENCOUNTER — Other Ambulatory Visit: Payer: Self-pay | Admitting: Family Medicine

## 2023-07-12 ENCOUNTER — Other Ambulatory Visit: Payer: Self-pay | Admitting: Family Medicine

## 2023-11-30 ENCOUNTER — Encounter: Payer: Self-pay | Admitting: Family Medicine

## 2023-12-04 NOTE — Progress Notes (Deleted)
 [facility] [address] [place] [address] Phone: [phone] Subjective:    I'm seeing this patient by the request  of:  Geofm Glade PARAS, MD  CC:   YEP:Dlagzrupcz  12/07/2022 We discussed the anatomy involved, and that carpal tunnel syndrome primarily involves the median nerve, and this typically affects digits one through 3.    We also discussed that mild cases of carpal tunnel syndrome are often improved with night splints.  If symptoms persist it is very reasonable to consider a carpal tunnel injection.    If the patient does have moderate to severe carpal tunnel syndrome based on NCV, then it is certainly reasonable to consider surgical consultation for definitive management possible carpal tunnel release.  We also discussed his severe carpal tunnel syndrome can lead to permanent nerve impairment even if released. At this point, the patient like to proceed conservatively.     Updated 12/05/2023 KHALEEF RUBY is a 68 y.o. male coming in with complaint of tingling in fingers      Past Medical History:  Diagnosis Date   Allergy    RHINITIS   Anemia    Arthritis    Bursitis of left foot 11/11/2016   Colonic polyp 09/2006   colonoscopy   Ganglion 11/02/2017   GERD (gastroesophageal reflux disease)    Golfer's elbow, right 09/25/2019   Gout    Heart beat abnormality 12/14/2017   Heart murmur    Herpes genitalis in men 02/14/2014   Hypertension    Osteitis pubis (HCC) 01/27/2016   Primary osteoarthritis of left knee 11/06/2017   Right groin pain 11/08/2018   SOB (shortness of breath)    per pt, may be coming from new medication   Subluxation of distal radial-ulnar joint 12/03/2019   Subluxation of tendon of long head of biceps 10/15/2016   Injected shoulder 12/19/2016   Subungual hematoma of foot, initial encounter 05/29/2018   SVT (supraventricular tachycardia) 03/09/2020   Past Surgical History:  Procedure Laterality Date   COLONOSCOPY   09/2006   KNEE ARTHROSCOPY Left 2011   KNEE SURGERY Left 08/2018   scar tissue surgery   LUMBAR DISC SURGERY  1990   SVT ABLATION N/A 01/09/2020   Procedure: SVT ABLATION;  Surgeon: Inocencio Soyla Lunger, MD;  [facility]: [facility];  Service: Cardiovascular;  Laterality: N/A;   TOTAL HIP ARTHROPLASTY Left 07/20/2021   Procedure: LEFT TOTAL HIP ARTHROPLASTY ANTERIOR APPROACH;  Surgeon: Sheril Maude, MD;  Location: [facility];  Service: Orthopedics;  Laterality: Left;   TOTAL KNEE ARTHROPLASTY Left 11/06/2017   Procedure: TOTAL KNEE ARTHROPLASTY;  Surgeon: Yvone Rush, MD;  Location: [facility];  Service: Orthopedics;  Laterality: Left;   Social History   Socioeconomic History   Marital status: Married    Spouse name: Not on file   Number of children: 1   Years of education: Not on file   Highest education level: Not on file  Occupational History   Not on file  Tobacco Use   Smoking status: Some Days    Types: Cigars   Smokeless tobacco: Never   Tobacco comments:    occasional cigar  Vaping Use   Vaping status: Never Used  Substance and Sexual Activity   Alcohol use: Yes    Alcohol/week: 3.0 standard drinks of alcohol    Types: 3 Standard drinks or equivalent per week    Comment: daily   Drug use: No   Sexual activity: Yes  Other  Topics Concern   Not on file  Social History Narrative   Not on file   Social Drivers of Health   Financial Resource Strain: Low Risk  (05/15/2023)   Received from [facility]   Overall Financial Resource Strain (CARDIA)    Difficulty of Paying Living Expenses: Not hard at all  Food Insecurity: No Food Insecurity (05/15/2023)   Received from [facility]   Hunger Vital Sign    Worried About Running Out of Food in the Last Year: Never true    Ran Out of Food in the Last Year: Never true  Transportation Needs: No Transportation Needs (05/15/2023)   [facility] -  Transportation    In the past 12 months, has lack of transportation kept you from medical appointments or from getting medications?: No    Lack of Transportation (Non-Medical): No  Physical Activity: Sufficiently Active (07/17/2020)   Received from [facility], [facility]   Exercise Vital Sign    Days of Exercise per Week: 5 days    Minutes of Exercise per Session: 60 min  Stress: No Stress Concern Present (07/17/2020)   Received from [facility], [facility]   [employer] of Occupational Health - Occupational Stress Questionnaire    Feeling of Stress : Only a little  Social Connections: Unknown (07/17/2020)   Received from [facility]   Social Connection and Isolation Panel [NHANES]    Frequency of Communication with Friends and Family: Not on file    Frequency of Social Gatherings with Friends and Family: Not on file    Attends Religious Services: Not on file    Active Member of Clubs or Organizations: Yes    Attends Banker Meetings: Not on file    Marital Status: Married   Allergies  Allergen Reactions   Beta Adrenergic Blockers Other (See Comments)    Fatigue   Lisinopril  Cough   Asa [Aspirin ] Rash    High dosage ASA causes break out   Demerol  Itching   Family History  Problem Relation Age of Onset   Stroke Father    Gout Father    Depression Brother    Dermatomyositis Daughter    Colon cancer Neg Hx    Rectal cancer Neg Hx    Esophageal cancer Neg Hx    Pancreatic cancer Neg Hx    Stomach cancer Neg Hx    Liver disease Neg Hx      Current Outpatient Medications (Cardiovascular):    diltiazem  (CARDIZEM  CD) 360 MG 24 hr capsule, TAKE 1 CAPSULE BY MOUTH EVERY DAY   flecainide (TAMBOCOR) 50 MG tablet, Take 50 mg by mouth 2 (two) times daily.   hydrochlorothiazide  (HYDRODIURIL ) 25 MG tablet, Take 25 mg by mouth daily.   losartan  (COZAAR ) 100 MG tablet, Take 100  mg by mouth daily.   nitroGLYCERIN  (NITRO-DUR ) 0.2 mg/hr patch, Apply 1/4 of a patch to skin once daily.   rosuvastatin  (CRESTOR ) 20 MG tablet, TAKE 1 TABLET BY MOUTH EVERY DAY  Current Outpatient Medications (Respiratory):    fexofenadine (ALLEGRA) 180 MG tablet, Take 180 mg by mouth at bedtime.    fluticasone (FLONASE) 50 MCG/ACT nasal spray, Place into the nose.   montelukast  (SINGULAIR ) 10 MG tablet, TAKE 1 TABLET BY MOUTH EVERYDAY AT BEDTIME  Current Outpatient Medications (Analgesics):    allopurinol  (ZYLOPRIM ) 300 MG tablet, TAKE 1 TABLET BY  MOUTH EVERY DAY   meloxicam  (MOBIC ) 15 MG tablet, TAKE 1 TABLET BY MOUTH EVERY DAY   Current Outpatient Medications (Other):    cholecalciferol (VITAMIN D ) 1000 units tablet, Take 1,000 Units by mouth daily.   dicyclomine (BENTYL) 10 MG/5ML solution,    doxycycline  (VIBRAMYCIN ) 100 MG capsule, Take 1 capsule (100 mg total) by mouth 2 (two) times daily.   esomeprazole  (NEXIUM ) 40 MG capsule, TAKE 1 CAPSULE (40 MG TOTAL) BY MOUTH 2 (TWO) TIMES DAILY BEFORE A MEAL. TAKE ONE TABLET 30-60 MINUTES BEFORE BREAKFAST   Multiple Vitamin (MULTIVITAMIN) tablet, Take 1 tablet by mouth daily.   TART CHERRY PO, Take by mouth.   traZODone  (DESYREL ) 50 MG tablet, TAKE 1/2 TO 1 TABLET BY MOUTH AT BEDTIME AS NEEDED FOR SLEEP   triamcinolone  (KENALOG ) 0.1 % paste, SMARTSIG:1 TO TEETH Every Night   valACYclovir  (VALTREX ) 500 MG tablet, Take 500 mg by mouth daily.   Reviewed prior external information including notes and imaging from  primary care provider As well as notes that were available from care everywhere and other healthcare systems.  Past medical history, social, surgical and family history all reviewed in electronic medical record.  No pertanent information unless stated regarding to the chief complaint.   Review of Systems:  No headache, visual changes, nausea, vomiting, diarrhea, constipation, dizziness, abdominal pain, skin rash, fevers, chills,  night sweats, weight loss, swollen lymph nodes, body aches, joint swelling, chest pain, shortness of breath, mood changes. POSITIVE muscle aches  Objective  There were no vitals taken for this visit.   General: No apparent distress alert and oriented x3 mood and affect normal, dressed appropriately.  HEENT: Pupils equal, extraocular movements intact  Respiratory: Patient's speak in full sentences and does not appear short of breath  Cardiovascular: No lower extremity edema, non tender, no erythema      Impression and Recommendations:

## 2023-12-06 ENCOUNTER — Ambulatory Visit: Payer: Medicare HMO | Admitting: Family Medicine

## 2023-12-07 ENCOUNTER — Other Ambulatory Visit: Payer: Self-pay

## 2023-12-07 ENCOUNTER — Ambulatory Visit: Payer: Medicare HMO | Admitting: Family Medicine

## 2023-12-07 ENCOUNTER — Encounter: Payer: Self-pay | Admitting: Family Medicine

## 2023-12-07 VITALS — BP 116/78 | HR 59 | Ht 72.0 in | Wt 197.0 lb

## 2023-12-07 DIAGNOSIS — G5602 Carpal tunnel syndrome, left upper limb: Secondary | ICD-10-CM | POA: Diagnosis not present

## 2023-12-07 NOTE — Progress Notes (Signed)
 [facility] [address] [place] [address] Phone: [phone] Subjective:   ISusannah Gully, am serving as a scribe for Dr. Arthea Claudene.  I'm seeing this patient by the request  of:  Geofm Glade PARAS, MD  CC: Left carpal tunnel  YEP:Dlagzrupcz  Matthew [name] is a 68 y.o. male coming in with complaint of tingling in fingers. Patient has been seen by Ortho for L carpal tunnel. Patient states having tingling and pain at night. Wakes him at night.       Past Medical History:  Diagnosis Date   Allergy    RHINITIS   Anemia    Arthritis    Bursitis of left foot 11/11/2016   Colonic polyp 09/2006   colonoscopy   Ganglion 11/02/2017   GERD (gastroesophageal reflux disease)    Golfer's elbow, right 09/25/2019   Gout    Heart beat abnormality 12/14/2017   Heart murmur    Herpes genitalis in men 02/14/2014   Hypertension    Osteitis pubis (HCC) 01/27/2016   Primary osteoarthritis of left knee 11/06/2017   Right groin pain 11/08/2018   SOB (shortness of breath)    per pt, may be coming from new medication   Subluxation of distal radial-ulnar joint 12/03/2019   Subluxation of tendon of long head of biceps 10/15/2016   Injected shoulder 12/19/2016   Subungual hematoma of foot, initial encounter 05/29/2018   SVT (supraventricular tachycardia) 03/09/2020   Past Surgical History:  Procedure Laterality Date   COLONOSCOPY  09/2006   KNEE ARTHROSCOPY Left 2011   KNEE SURGERY Left 08/2018   scar tissue surgery   LUMBAR DISC SURGERY  1990   SVT ABLATION N/A 01/09/2020   Procedure: SVT ABLATION;  Surgeon: Inocencio Soyla Lunger, MD;  [facility]: [facility];  Service: Cardiovascular;  Laterality: N/A;   TOTAL HIP ARTHROPLASTY Left 07/20/2021   Procedure: LEFT TOTAL HIP ARTHROPLASTY ANTERIOR APPROACH;  Surgeon: Sheril Maude, MD;  Location: [facility];  Service: Orthopedics;  Laterality: Left;   TOTAL KNEE ARTHROPLASTY Left 11/06/2017   Procedure: TOTAL KNEE  ARTHROPLASTY;  Surgeon: Yvone Rush, MD;  Location: [facility];  Service: Orthopedics;  Laterality: Left;   Social History   Socioeconomic History   Marital status: Married    Spouse name: Not on file   Number of children: 1   Years of education: Not on file   Highest education level: Not on file  Occupational History   Not on file  Tobacco Use   Smoking status: Some Days    Types: Cigars   Smokeless tobacco: Never   Tobacco comments:    occasional cigar  Vaping Use   Vaping status: Never Used  Substance and Sexual Activity   Alcohol use: Yes    Alcohol/week: 3.0 standard drinks of alcohol    Types: 3 Standard drinks or equivalent per week    Comment: daily   Drug use: No   Sexual activity: Yes  Other Topics Concern   Not on file  Social History Narrative   Not on file   Social Drivers of Health   Financial Resource Strain: Low Risk  (05/15/2023)   Received from [facility]   Overall Financial Resource Strain (CARDIA)    Difficulty of Paying Living Expenses: Not hard at all  Food Insecurity: No Food Insecurity (05/15/2023)   Received from [facility]   Hunger Vital Sign    Worried About Running Out of  Food in the Last Year: Never true    Ran Out of Food in the Last Year: Never true  Transportation Needs: No Transportation Needs (05/15/2023)   Received from [facility] - [facility]    In the past 12 months, has lack of transportation kept you from medical appointments or from getting medications?: No    Lack of Transportation (Non-Medical): No  Physical Activity: Sufficiently Active (07/17/2020)   Received from [facility], [facility]   Exercise Vital Sign    Days of Exercise per Week: 5 days    Minutes of Exercise per Session: 60 min  Stress: No Stress Concern Present (07/17/2020)   Received from [facility], [facility]   [employer] of Occupational Health - Occupational Stress Questionnaire    Feeling of Stress : Only a little  Social Connections: Unknown (07/17/2020)   Received from [facility]   Social Connection and Isolation Panel [NHANES]    Frequency of Communication with Friends and Family: Not on file    Frequency of Social Gatherings with Friends and Family: Not on file    Attends Religious Services: Not on file    Active Member of Clubs or Organizations: Yes    Attends Banker Meetings: Not on file    Marital Status: Married   Allergies  Allergen Reactions   Beta Adrenergic Blockers Other (See Comments)    Fatigue   Lisinopril  Cough   Asa [Aspirin ] Rash    High dosage ASA causes break out   Demerol  Itching   Family History  Problem Relation Age of Onset   Stroke Father    Gout Father    Depression Brother    Dermatomyositis Daughter    Colon cancer Neg Hx    Rectal cancer Neg Hx    Esophageal cancer Neg Hx    Pancreatic cancer Neg Hx    Stomach cancer Neg Hx    Liver disease Neg Hx      Current Outpatient Medications (Cardiovascular):    diltiazem  (CARDIZEM  CD) 360 MG 24 hr capsule, TAKE 1 CAPSULE BY MOUTH EVERY DAY   flecainide (TAMBOCOR) 50 MG tablet, Take 50 mg by mouth 2 (two) times daily.   hydrochlorothiazide  (HYDRODIURIL ) 25 MG tablet, Take 25 mg by mouth daily.   losartan  (COZAAR ) 100 MG tablet, Take 100 mg by mouth daily.   nitroGLYCERIN  (NITRO-DUR ) 0.2 mg/hr patch, Apply 1/4 of a patch to skin once daily.   rosuvastatin  (CRESTOR ) 20 MG tablet, TAKE 1 TABLET BY MOUTH EVERY DAY  Current Outpatient Medications (Respiratory):    fexofenadine (ALLEGRA) 180 MG tablet, Take 180 mg by mouth at bedtime.    fluticasone (FLONASE) 50 MCG/ACT nasal spray, Place into the nose.   montelukast  (SINGULAIR ) 10 MG tablet, TAKE 1 TABLET BY MOUTH EVERYDAY AT BEDTIME  Current Outpatient Medications (Analgesics):    allopurinol  (ZYLOPRIM ) 300 MG tablet, TAKE 1  TABLET BY MOUTH EVERY DAY   meloxicam  (MOBIC ) 15 MG tablet, TAKE 1 TABLET BY MOUTH EVERY DAY   Current Outpatient Medications (Other):    cholecalciferol (VITAMIN D ) 1000 units tablet, Take 1,000 Units by mouth daily.   dicyclomine (BENTYL) 10 MG/5ML solution,    doxycycline  (VIBRAMYCIN ) 100 MG capsule, Take 1 capsule (100 mg total) by mouth 2 (two) times daily.   esomeprazole  (NEXIUM ) 40 MG capsule, TAKE 1 CAPSULE (40 MG TOTAL) BY MOUTH 2 (TWO) TIMES DAILY BEFORE A  MEAL. TAKE ONE TABLET 30-60 MINUTES BEFORE BREAKFAST   Multiple Vitamin (MULTIVITAMIN) tablet, Take 1 tablet by mouth daily.   TART CHERRY PO, Take by mouth.   traZODone  (DESYREL ) 50 MG tablet, TAKE 1/2 TO 1 TABLET BY MOUTH AT BEDTIME AS NEEDED FOR SLEEP   triamcinolone  (KENALOG ) 0.1 % paste, SMARTSIG:1 TO TEETH Every Night   valACYclovir  (VALTREX ) 500 MG tablet, Take 500 mg by mouth daily.   Reviewed prior external information including notes and imaging from  primary care provider As well as notes that were available from care everywhere and other healthcare systems.  Past medical history, social, surgical and family history all reviewed in electronic medical record.  No pertanent information unless stated regarding to the chief complaint.   Review of Systems:  No headache, visual changes, nausea, vomiting, diarrhea, constipation, dizziness, abdominal pain, skin rash, fevers, chills, night sweats, weight loss, swollen lymph nodes, body aches, joint swelling, chest pain, shortness of breath, mood changes. POSITIVE muscle aches  Objective  Blood pressure 116/78, pulse (!) 59, height 6' (1.829 m), weight 197 lb (89.4 kg), SpO2 97%.   General: No apparent distress alert and oriented x3 mood and affect normal, dressed appropriately.  HEENT: Pupils equal, extraocular movements intact  Respiratory: Patient's speak in full sentences and does not appear short of breath  Cardiovascular: No lower extremity edema, non tender, no  erythema  Severe positive Tinel's on the left side.  Patient does not have any thenar eminence wasting noted.  Procedure: Real-time Ultrasound Guided Injection of left carpal tunnel Device: GE Logiq Q7 Ultrasound guided injection is preferred based studies that show increased duration, increased effect, greater accuracy, decreased procedural pain, increased response rate with ultrasound guided versus blind injection.  Verbal informed consent obtained.  Time-out conducted.  Noted no overlying erythema, induration, or other signs of local infection.  Skin prepped in a sterile fashion.  Local anesthesia: Topical Ethyl chloride.  With sterile technique and under real time ultrasound guidance:  median nerve visualized.  23g 5/8 inch needle inserted distal to proximal approach into nerve sheath. Pictures taken nfor needle placement. Patient did have injection of 2 cc of 0.5% Marcaine , and 1 cc of Kenalog  40 mg/dL. Completed without difficulty  Pain immediately resolved suggesting accurate placement of the medication.  Advised to call if fevers/chills, erythema, induration, drainage, or persistent bleeding.  Images permanently stored for review Impression: Technically successful ultrasound guided injection.    Impression and Recommendations:  Left carpal tunnel syndrome Patient given injection today tolerated procedure well, discussed icing regimen and home exercises, which activities to do and which ones to avoid.  Discussed bracing at night day and night for 2 weeks then nightly for 2 weeks.  Could consider the possibility of gabapentin  again if needed.  Discussed which activities to do and which ones to avoid.  Follow-up again in 6 to 8 weeks.    The above documentation has been reviewed and is accurate and complete Maronda Caison M Brisha Mccabe, DO

## 2023-12-07 NOTE — Patient Instructions (Addendum)
 Injection in wrist today  See you again in 6 weeks

## 2023-12-07 NOTE — Assessment & Plan Note (Signed)
 Patient given injection today tolerated procedure well, discussed icing regimen and home exercises, which activities to do and which ones to avoid.  Discussed bracing at night day and night for 2 weeks then nightly for 2 weeks.  Could consider the possibility of gabapentin  again if needed.  Discussed which activities to do and which ones to avoid.  Follow-up again in 6 to 8 weeks.

## 2023-12-12 ENCOUNTER — Ambulatory Visit: Payer: Medicare HMO | Admitting: Family Medicine

## 2023-12-20 ENCOUNTER — Ambulatory Visit: Payer: Medicare HMO | Admitting: Family Medicine

## 2024-01-07 ENCOUNTER — Other Ambulatory Visit: Payer: Self-pay | Admitting: Family Medicine

## 2024-01-18 NOTE — Progress Notes (Unsigned)
 [facility] [address] [place] [address] Phone: [phone] Subjective:   Matthew [name], am serving as a scribe for Dr. Antoine Primas.  I'm seeing this patient by the request  of:  Pincus Sanes, MD  CC: Left wrist pain, right shoulder pain  BJY:[id number]  12/07/2023 Patient given injection today tolerated procedure well, discussed icing regimen and home exercises, which activities to do and which ones to avoid.  Discussed bracing at night day and night for 2 weeks then nightly for 2 weeks.  Could consider the possibility of gabapentin again if needed.  Discussed which activities to do and which ones to avoid.  Follow-up again in 6 to 8 weeks.      Update 01/19/2024 Matthew [name] is a 68 y.o. male coming in with complaint of L wrist pain. Patient states wrist is doing well. R shoulder pain wants to talk measle vaccine and CA test and protein. [facility] DNA testing.     Past Medical History:  Diagnosis Date   Allergy    RHINITIS   Anemia    Arthritis    Bursitis of left foot 11/11/2016   Colonic polyp 09/2006   colonoscopy   Ganglion 11/02/2017   GERD (gastroesophageal reflux disease)    Golfer's elbow, right 09/25/2019   Gout    Heart beat abnormality 12/14/2017   Heart murmur    Herpes genitalis in men 02/14/2014   Hypertension    Osteitis pubis (HCC) 01/27/2016   Primary osteoarthritis of left knee 11/06/2017   Right groin pain 11/08/2018   SOB (shortness of breath)    per pt, may be coming from new medication   Subluxation of distal radial-ulnar joint 12/03/2019   Subluxation of tendon of long head of biceps 10/15/2016   Injected shoulder 12/19/2016   Subungual hematoma of foot, initial encounter 05/29/2018   SVT (supraventricular tachycardia) (HCC) 03/09/2020   Past Surgical History:  Procedure Laterality Date   COLONOSCOPY  09/2006   KNEE ARTHROSCOPY Left 2011   KNEE SURGERY Left 08/2018   scar tissue surgery   LUMBAR DISC SURGERY   1990   SVT ABLATION N/A 01/09/2020   Procedure: SVT ABLATION;  Surgeon: Regan Lemming, MD;  Location: [facility];  Service: Cardiovascular;  Laterality: N/A;   TOTAL HIP ARTHROPLASTY Left 07/20/2021   Procedure: LEFT TOTAL HIP ARTHROPLASTY ANTERIOR APPROACH;  Surgeon: Marcene Corning, MD;  Location: [facility];  Service: Orthopedics;  Laterality: Left;   TOTAL KNEE ARTHROPLASTY Left 11/06/2017   Procedure: TOTAL KNEE ARTHROPLASTY;  Surgeon: Jodi Geralds, MD;  Location: [facility];  Service: Orthopedics;  Laterality: Left;   Social History   Socioeconomic History   Marital status: Married    Spouse name: Not on file   Number of children: 1   Years of education: Not on file   Highest education level: Not on file  Occupational History   Not on file  Tobacco Use   Smoking status: Some Days    Types: Cigars   Smokeless tobacco: Never   Tobacco comments:    occasional cigar  Vaping Use   Vaping status: Never Used  Substance and Sexual Activity   Alcohol use: Yes    Alcohol/week: 3.0 standard drinks of alcohol    Types: 3 Standard drinks or equivalent per week    Comment: daily   Drug use: No   Sexual activity: Yes  Other Topics Concern   Not on file  Social  History Narrative   Not on file   Social Drivers of Health   Financial Resource Strain: Low Risk  (05/15/2023)   Received from [facility]   Overall Financial Resource Strain (CARDIA)    Difficulty of Paying Living Expenses: Not hard at all  Food Insecurity: No Food Insecurity (05/15/2023)   Received from [facility]   Hunger Vital Sign    Worried About Running Out of Food in the Last Year: Never true    Ran Out of Food in the Last Year: Never true  Transportation Needs: No Transportation Needs (05/15/2023)   Received from [facility]    In the past 12 months, has lack of transportation kept you from medical appointments or from getting  medications?: No    Lack of Transportation (Non-Medical): No  Physical Activity: Sufficiently Active (07/17/2020)   Received from [facility], [facility]   Exercise Vital Sign    Days of Exercise per Week: 5 days    Minutes of Exercise per Session: 60 min  Stress: No Stress Concern Present (07/17/2020)   Received from [facility], [facility]   [employer] of Occupational Health - Occupational Stress Questionnaire    Feeling of Stress : Only a little  Social Connections: Unknown (07/17/2020)   Received from [facility]   Social Connection and Isolation Panel [NHANES]    Frequency of Communication with Friends and Family: Not on file    Frequency of Social Gatherings with Friends and Family: Not on file    Attends Religious Services: Not on file    Active Member of Clubs or Organizations: Yes    Attends Banker Meetings: Not on file    Marital Status: Married   Allergies  Allergen Reactions   Beta Adrenergic Blockers Other (See Comments)    Fatigue   Lisinopril Cough   Asa [Aspirin] Rash    High dosage ASA causes break out   Demerol Itching   Family History  Problem Relation Age of Onset   Stroke Father    Gout Father    Depression Brother    Dermatomyositis Daughter    Colon cancer Neg Hx    Rectal cancer Neg Hx    Esophageal cancer Neg Hx    Pancreatic cancer Neg Hx    Stomach cancer Neg Hx    Liver disease Neg Hx      Current Outpatient Medications (Cardiovascular):    diltiazem (CARDIZEM CD) 360 MG 24 hr capsule, TAKE 1 CAPSULE BY MOUTH EVERY DAY   flecainide (TAMBOCOR) 50 MG tablet, Take 50 mg by mouth 2 (two) times daily.   hydrochlorothiazide (HYDRODIURIL) 25 MG tablet, Take 25 mg by mouth daily.   losartan (COZAAR) 100 MG tablet, Take 100 mg by mouth daily.   nitroGLYCERIN (NITRO-DUR) 0.2 mg/hr patch, Apply 1/4 of a patch to skin once daily.    rosuvastatin (CRESTOR) 20 MG tablet, TAKE 1 TABLET BY MOUTH EVERY DAY  Current Outpatient Medications (Respiratory):    fexofenadine (ALLEGRA) 180 MG tablet, Take 180 mg by mouth at bedtime.    fluticasone (FLONASE) 50 MCG/ACT nasal spray, Place into the nose.   montelukast (SINGULAIR) 10 MG tablet, TAKE 1 TABLET BY MOUTH EVERY NIGHT AT BEDTIME  Current Outpatient Medications (Analgesics):    allopurinol (ZYLOPRIM) 300 MG tablet, TAKE 1 TABLET BY MOUTH EVERY DAY   meloxicam (MOBIC) 15  MG tablet, TAKE 1 TABLET BY MOUTH EVERY DAY   Current Outpatient Medications (Other):    cholecalciferol (VITAMIN D) 1000 units tablet, Take 1,000 Units by mouth daily.   dicyclomine (BENTYL) 10 MG/5ML solution,    doxycycline (VIBRAMYCIN) 100 MG capsule, Take 1 capsule (100 mg total) by mouth 2 (two) times daily.   esomeprazole (NEXIUM) 40 MG capsule, TAKE 1 CAPSULE (40 MG TOTAL) BY MOUTH 2 (TWO) TIMES DAILY BEFORE A MEAL. TAKE ONE TABLET 30-60 MINUTES BEFORE BREAKFAST   Multiple Vitamin (MULTIVITAMIN) tablet, Take 1 tablet by mouth daily.   TART CHERRY PO, Take by mouth.   traZODone (DESYREL) 50 MG tablet, TAKE 1/2 TO 1 TABLET BY MOUTH AT BEDTIME AS NEEDED FOR SLEEP   triamcinolone (KENALOG) 0.1 % paste, SMARTSIG:1 TO TEETH Every Night   valACYclovir (VALTREX) 500 MG tablet, Take 500 mg by mouth daily.   Reviewed prior external information including notes and imaging from  primary care provider As well as notes that were available from care everywhere and other healthcare systems.  Past medical history, social, surgical and family history all reviewed in electronic medical record.  No pertanent information unless stated regarding to the chief complaint.   Review of Systems:  No headache, visual changes, nausea, vomiting, diarrhea, constipation, dizziness, abdominal pain, skin rash, fevers, chills, night sweats, weight loss, swollen lymph nodes, body aches, joint swelling, chest pain, shortness of  breath, mood changes. POSITIVE muscle aches  Objective  Blood pressure 122/78, pulse 73, height 6' (1.829 m), SpO2 96%.   General: No apparent distress alert and oriented x3 mood and affect normal, dressed appropriately.  HEENT: Pupils equal, extraocular movements intact  Respiratory: Patient's speak in full sentences and does not appear short of breath  Cardiovascular: No lower extremity edema, non tender, no erythema  Right shoulder exam shows a positive edema noted.  Worsening pain with pushing against.  Tender in the proximal aspect of the tricep and posterior shoulder.  No significant weakness noted.  Limited muscular skeletal ultrasound was performed and interpreted by Antoine Primas, M   Limited ultrasound shows a patient does have what appears to be a very partial tear at the musculotendinous juncture of the tendon on the posterior lateral aspect of the shoulder.  No retraction noted.  Rotator cuff has some degenerative changes but appears to be all chronic.    Impression and Recommendations:     The above documentation has been reviewed and is accurate and complete Judi Saa, DO

## 2024-01-19 ENCOUNTER — Encounter: Payer: Self-pay | Admitting: Family Medicine

## 2024-01-19 ENCOUNTER — Ambulatory Visit: Payer: Medicare HMO | Admitting: Family Medicine

## 2024-01-19 ENCOUNTER — Other Ambulatory Visit: Payer: Self-pay

## 2024-01-19 VITALS — BP 122/78 | HR 73 | Ht 72.0 in

## 2024-01-19 DIAGNOSIS — G5602 Carpal tunnel syndrome, left upper limb: Secondary | ICD-10-CM | POA: Diagnosis not present

## 2024-01-19 DIAGNOSIS — S46011S Strain of muscle(s) and tendon(s) of the rotator cuff of right shoulder, sequela: Secondary | ICD-10-CM

## 2024-01-19 DIAGNOSIS — M2550 Pain in unspecified joint: Secondary | ICD-10-CM | POA: Diagnosis not present

## 2024-01-19 DIAGNOSIS — S46019S Strain of muscle(s) and tendon(s) of the rotator cuff of unspecified shoulder, sequela: Secondary | ICD-10-CM | POA: Insufficient documentation

## 2024-01-19 NOTE — Patient Instructions (Addendum)
 MMR Titer Goal is 100g of protein Whey protein isolate and Nurri I'll look into genetic info through [place] See you again in 2 months

## 2024-01-19 NOTE — Assessment & Plan Note (Signed)
 Seems to be more on the tricep.  Discussed icing regimen and home exercises, patient is able to do activities but do little concerning with some pushing aspect.  Patient will continue to be active otherwise.  Discussed icing regimen.

## 2024-01-20 LAB — MEASLES/MUMPS/RUBELLA IMMUNITY
Mumps IgG: >300 [AU]/ml
Rubella: 15.6 {index}
Rubeola IgG: >300 [AU]/ml

## 2024-01-21 ENCOUNTER — Other Ambulatory Visit: Payer: Self-pay | Admitting: Family Medicine

## 2024-01-22 ENCOUNTER — Encounter: Payer: Self-pay | Admitting: Family Medicine

## 2024-02-12 ENCOUNTER — Encounter: Payer: Self-pay | Admitting: Family Medicine

## 2024-02-13 NOTE — Telephone Encounter (Signed)
Rx filled. Patient notified.  

## 2024-02-16 ENCOUNTER — Encounter: Payer: Self-pay | Admitting: Family Medicine

## 2024-02-19 NOTE — Telephone Encounter (Signed)
 Added to wait list.

## 2024-02-23 NOTE — Progress Notes (Unsigned)
 [facility] [address] [place] [address] Phone: [phone] Subjective:   Matthew [name], am serving as a scribe for Dr. Ronnell Coins.  I'm seeing this patient by the request  of:  Colene Dauphin, MD  CC: Right shoulder pain, right hand pain  [id number]  01/19/2024 Seems to be more on the tricep.  Discussed icing regimen and home exercises, patient is able to do activities but do little concerning with some pushing aspect.  Patient will continue to be active otherwise.  Discussed icing regimen.     Updated 02/27/2024 Matthew [name] is a 68 y.o. male coming in with complaint of L wrist and R shoulder. Patient states that he is having R hand ring finger pain over proximal phalaynx. No injury to area.   Also c/o R shoulder pain in posteiror aspect. Pain not improving since last visit.        Past Medical History:  Diagnosis Date   Allergy    RHINITIS   Anemia    Arthritis    Bursitis of left foot 11/11/2016   Colonic polyp 09/2006   colonoscopy   Ganglion 11/02/2017   GERD (gastroesophageal reflux disease)    Golfer's elbow, right 09/25/2019   Gout    Heart beat abnormality 12/14/2017   Heart murmur    Herpes genitalis in men 02/14/2014   Hypertension    Osteitis pubis (HCC) 01/27/2016   Primary osteoarthritis of left knee 11/06/2017   Right groin pain 11/08/2018   SOB (shortness of breath)    per pt, may be coming from new medication   Subluxation of distal radial-ulnar joint 12/03/2019   Subluxation of tendon of long head of biceps 10/15/2016   Injected shoulder 12/19/2016   Subungual hematoma of foot, initial encounter 05/29/2018   SVT (supraventricular tachycardia) (HCC) 03/09/2020   Past Surgical History:  Procedure Laterality Date   COLONOSCOPY  09/2006   KNEE ARTHROSCOPY Left 2011   KNEE SURGERY Left 08/2018   scar tissue surgery   LUMBAR DISC SURGERY  1990   SVT ABLATION N/A 01/09/2020   Procedure: SVT ABLATION;   Surgeon: Lei Pump, MD;  Location: [facility];  Service: Cardiovascular;  Laterality: N/A;   TOTAL HIP ARTHROPLASTY Left 07/20/2021   Procedure: LEFT TOTAL HIP ARTHROPLASTY ANTERIOR APPROACH;  Surgeon: Dayne Even, MD;  Location: [facility];  Service: Orthopedics;  Laterality: Left;   TOTAL KNEE ARTHROPLASTY Left 11/06/2017   Procedure: TOTAL KNEE ARTHROPLASTY;  Surgeon: Neil Balls, MD;  Location: [facility];  Service: Orthopedics;  Laterality: Left;   Social History   Socioeconomic History   Marital status: Married    Spouse name: Not on file   Number of children: 1   Years of education: Not on file   Highest education level: Not on file  Occupational History   Not on file  Tobacco Use   Smoking status: Some Days    Types: Cigars   Smokeless tobacco: Never   Tobacco comments:    occasional cigar  Vaping Use   Vaping status: Never Used  Substance and Sexual Activity   Alcohol use: Yes    Alcohol/week: 3.0 standard drinks of alcohol    Types: 3 Standard drinks or equivalent per week    Comment: daily   Drug use: No   Sexual activity: Yes  Other Topics Concern   Not on file  Social History Narrative   Not on file   Social Drivers  of Health   Financial Resource Strain: Low Risk  (05/15/2023)   Received from [facility]   Overall Financial Resource Strain (CARDIA)    Difficulty of Paying Living Expenses: Not hard at all  Food Insecurity: No Food Insecurity (05/15/2023)   Received from [facility]   Hunger Vital Sign    Worried About Running Out of Food in the Last Year: Never true    Ran Out of Food in the Last Year: Never true  Transportation Needs: No Transportation Needs (05/15/2023)   Received from [facility]    In the past 12 months, has lack of transportation kept you from medical appointments or from getting medications?: No    Lack of Transportation (Non-Medical): No   Physical Activity: Sufficiently Active (07/17/2020)   Received from [facility], [facility]   Exercise Vital Sign    Days of Exercise per Week: 5 days    Minutes of Exercise per Session: 60 min  Stress: No Stress Concern Present (07/17/2020)   Received from [facility], [facility]   [employer] of Occupational Health - Occupational Stress Questionnaire    Feeling of Stress : Only a little  Social Connections: Unknown (07/17/2020)   Received from [facility]   Social Connection and Isolation Panel [NHANES]    Frequency of Communication with Friends and Family: Not on file    Frequency of Social Gatherings with Friends and Family: Not on file    Attends Religious Services: Not on file    Active Member of Clubs or Organizations: Yes    Attends Banker Meetings: Not on file    Marital Status: Married   Allergies  Allergen Reactions   Beta Adrenergic Blockers Other (See Comments)    Fatigue   Lisinopril  Cough   Asa [Aspirin ] Rash    High dosage ASA causes break out   Demerol  Itching   Family History  Problem Relation Age of Onset   Stroke Father    Gout Father    Depression Brother    Dermatomyositis Daughter    Colon cancer Neg Hx    Rectal cancer Neg Hx    Esophageal cancer Neg Hx    Pancreatic cancer Neg Hx    Stomach cancer Neg Hx    Liver disease Neg Hx      Current Outpatient Medications (Cardiovascular):    diltiazem  (CARDIZEM  CD) 360 MG 24 hr capsule, TAKE 1 CAPSULE BY MOUTH EVERY DAY   flecainide (TAMBOCOR) 50 MG tablet, Take 50 mg by mouth 2 (two) times daily.   hydrochlorothiazide  (HYDRODIURIL ) 25 MG tablet, Take 25 mg by mouth daily.   losartan  (COZAAR ) 100 MG tablet, Take 100 mg by mouth daily.   nitroGLYCERIN  (NITRO-DUR ) 0.2 mg/hr patch, Apply 1/4 of a patch to skin once daily.   rosuvastatin  (CRESTOR ) 20 MG tablet, TAKE 1 TABLET BY MOUTH EVERY  DAY  Current Outpatient Medications (Respiratory):    fexofenadine (ALLEGRA) 180 MG tablet, Take 180 mg by mouth at bedtime.    fluticasone (FLONASE) 50 MCG/ACT nasal spray, Place into the nose.   montelukast  (SINGULAIR ) 10 MG tablet, TAKE 1 TABLET BY MOUTH EVERY NIGHT AT BEDTIME  Current Outpatient Medications (Analgesics):    allopurinol  (ZYLOPRIM ) 300 MG tablet, TAKE 1 TABLET BY MOUTH EVERY DAY   meloxicam  (MOBIC ) 15 MG tablet, TAKE 1 TABLET BY MOUTH DAILY   Current  Outpatient Medications (Other):    cholecalciferol (VITAMIN D ) 1000 units tablet, Take 1,000 Units by mouth daily.   dicyclomine (BENTYL) 10 MG/5ML solution,    doxycycline  (VIBRAMYCIN ) 100 MG capsule, Take 1 capsule (100 mg total) by mouth 2 (two) times daily.   esomeprazole  (NEXIUM ) 40 MG capsule, TAKE 1 CAPSULE (40 MG TOTAL) BY MOUTH 2 (TWO) TIMES DAILY BEFORE A MEAL. TAKE ONE TABLET 30-60 MINUTES BEFORE BREAKFAST   Multiple Vitamin (MULTIVITAMIN) tablet, Take 1 tablet by mouth daily.   TART CHERRY PO, Take by mouth.   traZODone  (DESYREL ) 50 MG tablet, TAKE 1/2 TO 1 TABLET BY MOUTH AT BEDTIME AS NEEDED FOR SLEEP   triamcinolone  (KENALOG ) 0.1 % paste, SMARTSIG:1 TO TEETH Every Night   valACYclovir  (VALTREX ) 500 MG tablet, Take 500 mg by mouth daily.   Reviewed prior external information including notes and imaging from  primary care provider As well as notes that were available from care everywhere and other healthcare systems.  Past medical history, social, surgical and family history all reviewed in electronic medical record.  No pertanent information unless stated regarding to the chief complaint.   Review of Systems:  No headache, visual changes, nausea, vomiting, diarrhea, constipation, dizziness, abdominal pain, skin rash, fevers, chills, night sweats, weight loss, swollen lymph nodes, body aches, joint swelling, chest pain, shortness of breath, mood changes. POSITIVE muscle aches  Objective  Blood pressure  122/78, pulse (!) 57, height 6' (1.829 m), weight 197 lb (89.4 kg), SpO2 97%.   General: No apparent distress alert and oriented x3 mood and affect normal, dressed appropriately.  HEENT: Pupils equal, extraocular movements intact  Respiratory: Patient's speak in full sentences and does not appear short of breath  Cardiovascular: No lower extremity edema, non tender, no erythema  Right shoulder does have positive impingement noted with Neer and Hawkins.  Positive crossover noted as well.  Patient's right hand does show that there is a trigger nodule at the A1 pulley of the ring finger noted.  Significant amount of swelling that does not allow patient to even flex fully.  Procedure: Real-time Ultrasound Guided Injection of right ring finger flexor tendon sheath Device: GE Logiq Q7 Ultrasound guided injection is preferred based studies that show increased duration, increased effect, greater accuracy, decreased procedural pain, increased response rate, and decreased cost with ultrasound guided versus blind injection.  Verbal informed consent obtained.  Time-out conducted.  Noted no overlying erythema, induration, or other signs of local infection.  Skin prepped in a sterile fashion.  Local anesthesia: Topical Ethyl chloride.  With sterile technique and under real time ultrasound guidance: With a 25-gauge half inch needle injected with 0.5 cc of 0.5% Marcaine  and 0.5 cc of Kenalog  40 mg/mL Completed without difficulty  Pain immediately resolved suggesting accurate placement of the medication.  Advised to call if fevers/chills, erythema, induration, drainage, or persistent bleeding.  Images saved Impression: Technically successful ultrasound guided injection.   Procedure: Real-time Ultrasound Guided Injection of right glenohumeral joint Device: GE Logiq Q7  Ultrasound guided injection is preferred based studies that show increased duration, increased effect, greater accuracy, decreased  procedural pain, increased response rate with ultrasound guided versus blind injection.  Verbal informed consent obtained.  Time-out conducted.  Noted no overlying erythema, induration, or other signs of local infection.  Skin prepped in a sterile fashion.  Local anesthesia: Topical Ethyl chloride.  With sterile technique and under real time ultrasound guidance:  Joint visualized.  23g 1  inch needle inserted posterior approach.  Pictures taken for needle placement. Patient did have injection of  2 cc of 0.5% Marcaine , and 1.0 cc of Kenalog  40 mg/dL. Completed without difficulty  Pain immediately resolved suggesting accurate placement of the medication.  Advised to call if fevers/chills, erythema, induration, drainage, or persistent bleeding.  Images saved Impression: Technically successful ultrasound guided injection.   Procedure: Real-time Ultrasound Guided Injection of right acromioclavicular joint Device: GE Logiq Q7 Ultrasound guided injection is preferred based studies that show increased duration, increased effect, greater accuracy, decreased procedural pain, increased response rate, and decreased cost with ultrasound guided versus blind injection.  Verbal informed consent obtained.  Time-out conducted.  Noted no overlying erythema, induration, or other signs of local infection.  Skin prepped in a sterile fashion.  Local anesthesia: Topical Ethyl chloride.  With sterile technique and under real time ultrasound guidance: With a 25-gauge half inch needle injected with 0.5 cc of 0.5% Marcaine  and 0.5 cc of Kenalog  40 mg/mL Completed without difficulty  Pain immediately resolved suggesting accurate placement of the medication.  Advised to call if fevers/chills, erythema, induration, drainage, or persistent bleeding.  Images saved Impression: Technically successful ultrasound guided injection.   Impression and Recommendations:    The above documentation has been reviewed and is  accurate and complete Matthew Gully M Ninoshka Wainwright, DO

## 2024-02-27 ENCOUNTER — Ambulatory Visit: Admitting: Family Medicine

## 2024-02-27 ENCOUNTER — Other Ambulatory Visit: Payer: Self-pay

## 2024-02-27 VITALS — BP 122/78 | HR 57 | Ht 72.0 in | Wt 197.0 lb

## 2024-02-27 DIAGNOSIS — M19011 Primary osteoarthritis, right shoulder: Secondary | ICD-10-CM

## 2024-02-27 DIAGNOSIS — M25532 Pain in left wrist: Secondary | ICD-10-CM

## 2024-02-27 DIAGNOSIS — M65341 Trigger finger, right ring finger: Secondary | ICD-10-CM | POA: Diagnosis not present

## 2024-02-27 DIAGNOSIS — M7551 Bursitis of right shoulder: Secondary | ICD-10-CM | POA: Diagnosis not present

## 2024-02-27 NOTE — Patient Instructions (Signed)
 Injected trigger finger Injected shoulder in 2 spots See me again in

## 2024-02-28 ENCOUNTER — Encounter: Payer: Self-pay | Admitting: Family Medicine

## 2024-02-28 DIAGNOSIS — M65341 Trigger finger, right ring finger: Secondary | ICD-10-CM | POA: Insufficient documentation

## 2024-02-28 DIAGNOSIS — M7551 Bursitis of right shoulder: Secondary | ICD-10-CM | POA: Insufficient documentation

## 2024-02-28 NOTE — Assessment & Plan Note (Signed)
 Has had similar symptoms previously.  Chronic problem with worsening symptoms.  Known arthritic changes in the area that we will continue to monitor.  Discussed icing regimen of home exercises, discussed which activities to do and which ones to avoid.  Increase activity slowly.  Follow-up again in 6 to 8 weeks

## 2024-02-28 NOTE — Assessment & Plan Note (Signed)
 Injection given, discussed icing regimen and home exercises, discussed which activities to do and which ones to avoid.  Discussed icing regimen.  Follow-up again in 6 to 8 weeks can repeat injections every 3 months if necessary but very hopeful this will make significant improvement.

## 2024-02-28 NOTE — Assessment & Plan Note (Signed)
 Injection given today and tolerated the procedure well, discussed icing regimen and home exercises.  Did not see any true rotator cuff pathology.  Very hopeful that this will make improvement.  Patient is going to be in the process of moving which may cause some irritation as well that we will need to monitor.

## 2024-03-08 ENCOUNTER — Other Ambulatory Visit: Payer: Self-pay | Admitting: Family Medicine

## 2024-03-15 ENCOUNTER — Ambulatory Visit: Admitting: Family Medicine

## 2024-04-09 ENCOUNTER — Encounter: Payer: Self-pay | Admitting: Family Medicine

## 2024-04-09 ENCOUNTER — Other Ambulatory Visit: Payer: Self-pay

## 2024-04-09 ENCOUNTER — Ambulatory Visit: Admitting: Family Medicine

## 2024-04-09 VITALS — BP 120/60 | HR 82 | Ht 72.0 in

## 2024-04-09 DIAGNOSIS — M79645 Pain in left finger(s): Secondary | ICD-10-CM

## 2024-04-09 DIAGNOSIS — G8929 Other chronic pain: Secondary | ICD-10-CM

## 2024-04-09 DIAGNOSIS — R200 Anesthesia of skin: Secondary | ICD-10-CM

## 2024-04-09 DIAGNOSIS — R202 Paresthesia of skin: Secondary | ICD-10-CM

## 2024-04-09 NOTE — Assessment & Plan Note (Signed)
 Continues to have the thumb numbness.  Is having some atrophy of the thenar eminence.  Discussed the potential for nerve conduction.  Patient would like to try to wear a glove when he does repetitive activity and see if this makes any response otherwise we will consider the possibility of nerve conduction study thereafter. Found to have some swelling noted on ultrasound today that we will need to be monitored.

## 2024-04-09 NOTE — Progress Notes (Signed)
 [facility] [address] [place] [address] Phone: [phone] Subjective:   Peggye Bowers am a scribe for Dr. Felipe Horton.   I'm seeing this patient by the request  of:  Colene Dauphin, MD  CC: Left thumb pain and numbness follow-up  [id number]  02/27/2024 Injection given today and tolerated the procedure well, discussed icing regimen and home exercises.  Did not see any true rotator cuff pathology.  Very hopeful that this will make improvement.  Patient is going to be in the process of moving which may cause some irritation as well that we will need to monitor.     Has had similar symptoms previously.  Chronic problem with worsening symptoms.  Known arthritic changes in the area that we will continue to monitor.  Discussed icing regimen of home exercises, discussed which activities to do and which ones to avoid.  Increase activity slowly.  Follow-up again in 6 to 8 weeks     Updated 04/09/2024 WINSON EICHORN is a 68 y.o. male coming in with complaint of shoulder and finger pain. Patient states that it is the left thumb that is giving him issues. Thumb is numb. It comes and goes. Shoulder is good.   Recently did have labs from an outside facility.  Total cholesterol significantly improved at 138 with a LDL of 42\  Vitamin D  of 42  Past Medical History:  Diagnosis Date   Allergy    RHINITIS   Anemia    Arthritis    Bursitis of left foot 11/11/2016   Colonic polyp 09/2006   colonoscopy   Ganglion 11/02/2017   GERD (gastroesophageal reflux disease)    Golfer's elbow, right 09/25/2019   Gout    Heart beat abnormality 12/14/2017   Heart murmur    Herpes genitalis in men 02/14/2014   Hypertension    Osteitis pubis (HCC) 01/27/2016   Primary osteoarthritis of left knee 11/06/2017   Right groin pain 11/08/2018   SOB (shortness of breath)    per pt, may be coming from new medication   Subluxation of distal radial-ulnar joint 12/03/2019    Subluxation of tendon of long head of biceps 10/15/2016   Injected shoulder 12/19/2016   Subungual hematoma of foot, initial encounter 05/29/2018   SVT (supraventricular tachycardia) (HCC) 03/09/2020   Past Surgical History:  Procedure Laterality Date   COLONOSCOPY  09/2006   KNEE ARTHROSCOPY Left 2011   KNEE SURGERY Left 08/2018   scar tissue surgery   LUMBAR DISC SURGERY  1990   SVT ABLATION N/A 01/09/2020   Procedure: SVT ABLATION;  Surgeon: Lei Pump, MD;  Location: [facility];  Service: Cardiovascular;  Laterality: N/A;   TOTAL HIP ARTHROPLASTY Left 07/20/2021   Procedure: LEFT TOTAL HIP ARTHROPLASTY ANTERIOR APPROACH;  Surgeon: Dayne Even, MD;  Location: [facility];  Service: Orthopedics;  Laterality: Left;   TOTAL KNEE ARTHROPLASTY Left 11/06/2017   Procedure: TOTAL KNEE ARTHROPLASTY;  Surgeon: Neil Balls, MD;  Location: [facility];  Service: Orthopedics;  Laterality: Left;   Social History   Socioeconomic History   Marital status: Married    Spouse name: Not on file   Number of children: 1   Years of education: Not on file   Highest education level: Not on file  Occupational History   Not on file  Tobacco Use   Smoking status: Some Days    Types: Cigars   Smokeless tobacco: Never   Tobacco comments:  occasional cigar  Vaping Use   Vaping status: Never Used  Substance and Sexual Activity   Alcohol use: Yes    Alcohol/week: 3.0 standard drinks of alcohol    Types: 3 Standard drinks or equivalent per week    Comment: daily   Drug use: No   Sexual activity: Yes  Other Topics Concern   Not on file  Social History Narrative   Not on file   Social Drivers of Health   Financial Resource Strain: Low Risk  (04/04/2024)   Received from [facility]   Overall Financial Resource Strain (CARDIA)    Difficulty of Paying Living Expenses: Not hard at all  Food Insecurity: No Food Insecurity (04/04/2024)   Received from [facility]   Hunger Vital Sign    Worried About Running Out of Food in the Last Year: Never true    Ran Out of Food in the Last Year: Never true  Transportation Needs: No Transportation Needs (04/04/2024)   Received from [facility]    In the past 12 months, has lack of transportation kept you from medical appointments or from getting medications?: No    Lack of Transportation (Non-Medical): No  Physical Activity: Sufficiently Active (07/17/2020)   Received from [facility], [facility]   Exercise Vital Sign    Days of Exercise per Week: 5 days    Minutes of Exercise per Session: 60 min  Stress: No Stress Concern Present (07/17/2020)   Received from [facility], [facility]   [employer] of Occupational Health - Occupational Stress Questionnaire    Feeling of Stress : Only a little  Social Connections: Unknown (07/17/2020)   Received from [facility]   Social Connection and Isolation Panel [NHANES]    Frequency of Communication with Friends and Family: Not on file    Frequency of Social Gatherings with Friends and Family: Not on file    Attends Religious Services: Not on file    Active Member of Clubs or Organizations: Yes    Attends Banker Meetings: Not on file    Marital Status: Married   Allergies  Allergen Reactions   Beta Adrenergic Blockers Other (See Comments)    Fatigue   Lisinopril  Cough   Asa [Aspirin ] Rash    High dosage ASA causes break out   Demerol  Itching   Family History  Problem Relation Age of Onset   Stroke Father    Gout Father    Depression Brother    Dermatomyositis Daughter    Colon cancer Neg Hx    Rectal cancer Neg Hx    Esophageal cancer Neg Hx    Pancreatic cancer Neg Hx    Stomach cancer Neg Hx    Liver disease Neg Hx      Current Outpatient Medications (Cardiovascular):    diltiazem  (CARDIZEM   CD) 360 MG 24 hr capsule, TAKE 1 CAPSULE BY MOUTH EVERY DAY   flecainide (TAMBOCOR) 50 MG tablet, Take 50 mg by mouth 2 (two) times daily.   hydrochlorothiazide  (HYDRODIURIL ) 25 MG tablet, Take 25 mg by mouth daily.   losartan  (COZAAR ) 100 MG tablet, Take 100 mg by mouth daily.   nitroGLYCERIN  (NITRO-DUR ) 0.2 mg/hr patch, Apply 1/4 of a patch to skin once daily.   rosuvastatin  (CRESTOR ) 20 MG tablet, TAKE 1 TABLET BY MOUTH EVERY DAY  Current Outpatient Medications (Respiratory):  fexofenadine (ALLEGRA) 180 MG tablet, Take 180 mg by mouth at bedtime.    fluticasone (FLONASE) 50 MCG/ACT nasal spray, Place into the nose.   montelukast  (SINGULAIR ) 10 MG tablet, TAKE 1 TABLET BY MOUTH EVERY NIGHT AT BEDTIME  Current Outpatient Medications (Analgesics):    allopurinol  (ZYLOPRIM ) 300 MG tablet, TAKE 1 TABLET BY MOUTH EVERY DAY   meloxicam  (MOBIC ) 15 MG tablet, TAKE 1 TABLET BY MOUTH DAILY   Current Outpatient Medications (Other):    cholecalciferol (VITAMIN D ) 1000 units tablet, Take 1,000 Units by mouth daily.   dicyclomine (BENTYL) 10 MG/5ML solution,    doxycycline  (VIBRAMYCIN ) 100 MG capsule, Take 1 capsule (100 mg total) by mouth 2 (two) times daily.   esomeprazole  (NEXIUM ) 40 MG capsule, TAKE 1 CAPSULE (40 MG TOTAL) BY MOUTH 2 (TWO) TIMES DAILY BEFORE A MEAL. TAKE ONE TABLET 30-60 MINUTES BEFORE BREAKFAST   Multiple Vitamin (MULTIVITAMIN) tablet, Take 1 tablet by mouth daily.   TART CHERRY PO, Take by mouth.   traZODone  (DESYREL ) 50 MG tablet, TAKE 1/2 TO 1 TABLET BY MOUTH EVERY NIGHT AT BEDTIME AS NEEDED FOR SLEEP   triamcinolone  (KENALOG ) 0.1 % paste, SMARTSIG:1 TO TEETH Every Night   valACYclovir  (VALTREX ) 500 MG tablet, Take 500 mg by mouth daily.   Reviewed prior external information including notes and imaging from  primary care provider As well as notes that were available from care everywhere and other healthcare systems.  Past medical history, social, surgical and family  history all reviewed in electronic medical record.  No pertanent information unless stated regarding to the chief complaint.   Review of Systems:  No headache, visual changes, nausea, vomiting, diarrhea, constipation, dizziness, abdominal pain, skin rash, fevers, chills, night sweats, weight loss, swollen lymph nodes, body aches, joint swelling, chest pain, shortness of breath, mood changes. POSITIVE muscle aches  Objective  Blood pressure 120/60, pulse 82, height 6' (1.829 m), SpO2 96%.   General: No apparent distress alert and oriented x3 mood and affect normal, dressed appropriately.  HEENT: Pupils equal, extraocular movements intact  Respiratory: Patient's speak in full sentences and does not appear short of breath  Cardiovascular: No lower extremity edema, non tender, no erythema  Left thumb does have thenar eminence wasting noted.  Negative Tinel's noted today.  Good grip strength noted.  Limited muscular skeletal ultrasound was performed and interpreted by Ronnell Coins, M  Limited ultrasound shows some hypoechoic changes of the [facility] joint noted.  Patient's median does not have any significant swelling noted today. Impression: Mild malunion with the fusion of the [facility] joint.    Impression and Recommendations:    The above documentation has been reviewed and is accurate and complete Claudine Stallings M Grantham Hippert, DO

## 2024-04-09 NOTE — Patient Instructions (Signed)
 Good to see you. Wear glove on the hand. If you continue to have issues we can consider a nerve conduction test. The thing on the back of your hand looks like a Lipoma. Follow up in 6 to 8 weeks.

## 2024-04-10 ENCOUNTER — Other Ambulatory Visit: Payer: Self-pay

## 2024-04-10 DIAGNOSIS — M62542 Muscle wasting and atrophy, not elsewhere classified, left hand: Secondary | ICD-10-CM

## 2024-04-16 ENCOUNTER — Other Ambulatory Visit: Payer: Self-pay

## 2024-04-16 ENCOUNTER — Encounter: Payer: Self-pay | Admitting: Neurology

## 2024-04-16 DIAGNOSIS — R202 Paresthesia of skin: Secondary | ICD-10-CM

## 2024-05-08 ENCOUNTER — Other Ambulatory Visit: Payer: Self-pay | Admitting: Family Medicine

## 2024-05-10 ENCOUNTER — Ambulatory Visit: Admitting: Neurology

## 2024-05-10 ENCOUNTER — Ambulatory Visit: Payer: Self-pay | Admitting: Family Medicine

## 2024-05-10 DIAGNOSIS — G5603 Carpal tunnel syndrome, bilateral upper limbs: Secondary | ICD-10-CM

## 2024-05-10 DIAGNOSIS — R202 Paresthesia of skin: Secondary | ICD-10-CM

## 2024-05-10 DIAGNOSIS — G5622 Lesion of ulnar nerve, left upper limb: Secondary | ICD-10-CM

## 2024-05-10 NOTE — Procedures (Signed)
 [facility]  [address], [address]  [place], [place] [address] Tel: [phone] Fax: [phone] Test Date:  05/10/2024  Patient: Matthew [name] DOB: 02-27-1956 Physician: Tonita Blanch, DO  Sex: Male Height: 6' 0 Ref Phys: Arthea Sharps, DO  ID#: [id number]   Technician:    History: This is a 68 year old man referred for evaluation of left arm pain and paresthesias.  NCV & EMG Findings: Extensive electrodiagnostic testing of the left upper extremity and additional studies of the right shows:  Left median sensory response is absent.  Right median and left ulnar sensory responses show prolonged latency (R4.2, L3.8 ms).  Right ulnar and bilateral radial sensory responses are within normal limits. Left median motor response is severely prolonged latency (10.5 ms) and reduced amplitude (0.6 mV).  Right median motor response shows prolonged latency (5.2 ms).  Of note, there is evidence of bilateral Martin-Gruber anastomoses, a normal anatomic variant.  Left ulnar motor response shows prolonged latency (3.3 ms) and decreased conduction velocity (A Elbow-B Elbow, 30 m/s).  Right ulnar motor response is within normal limits.   Chronic motor axonal loss changes are seen affecting the left abductor pollicis brevis, first dorsal interosseous, and abductor digiti minimi muscles, without accompanying active denervation.  These findings are not present in the right upper extremity.    Impression: Left median neuropathy at or distal to the wrist, consistent with a clinical diagnosis of carpal tunnel syndrome.  Overall, these findings are very severe in degree electrically. Left ulnar neuropathy with slowing across the elbow, demyelinating, moderate. Right median neuropathy at or distal to the wrist, consistent with a clinical diagnosis of carpal tunnel syndrome.  Overall, these findings are moderate in degree electrically.    ___________________________ Tonita Blanch, DO    Nerve  Conduction Studies   Stim Site NR Peak (ms) Norm Peak (ms) O-P Amp (V) Norm O-P Amp  Left Median Anti Sensory (2nd Digit)  32 C  Wrist *NR  <3.8  >10  Right Median Anti Sensory (2nd Digit)  32 C  Wrist    *4.2 <3.8 17.0 >10  Left Radial Anti Sensory (Base 1st Digit)  32 C  Wrist    2.6 <2.8 22.4 >10  Right Radial Anti Sensory (Base 1st Digit)  32 C  Wrist    2.7 <2.8 21.4 >10  Left Ulnar Anti Sensory (5th Digit)  32 C  Wrist    *3.8 <3.2 9.2 >5  Right Ulnar Anti Sensory (5th Digit)  32 C  Wrist    3.2 <3.2 14.5 >5     Stim Site NR Onset (ms) Norm Onset (ms) O-P Amp (mV) Norm O-P Amp Site1 Site2 Delta-0 (ms) Dist (cm) Vel (m/s) Norm Vel (m/s)  Left Median Motor (Abd Poll Brev)  32 C  Wrist    *10.5 <4.0 *0.6 >5 Elbow Wrist 2.2 35.0 159 >50  Elbow    12.7  1.6  Ulnar-wrist crossover Elbow 8.1 0.0    Ulnar-wrist crossover    4.6  6.0         Right Median Motor (Abd Poll Brev)  32 C  Wrist    *5.2 <4.0 5.3 >5 Elbow Wrist 6.4 34.0 53 >50  Elbow    11.6  7.0  Ulnar-wrist crossover Elbow 7.3 0.0    Ulnar-wrist crossover    4.3  4.4         Left Ulnar Motor (Abd Dig Minimi)  32 C  Wrist    *3.3 <3.1 9.8 >  7 B Elbow Wrist 5.0 25.0 50 >50  B Elbow    8.3  7.1  A Elbow B Elbow 3.3 10.0 *30 >50  A Elbow    11.6  6.2         Right Ulnar Motor (Abd Dig Minimi)  32 C  Wrist    2.6 <3.1 10.5 >7 B Elbow Wrist 4.4 25.0 57 >50  B Elbow    7.0  9.5  A Elbow B Elbow 2.0 10.0 50 >50  A Elbow    9.0  9.2          Electromyography   Side Muscle Ins.Act Fibs Fasc Recrt Amp Dur Poly Activation Comment  Left 1stDorInt Nml Nml Nml *1- *1+ *1+ *1+ Nml N/A  Left Abd Poll Brev Nml Nml Nml *SMU *1+ *1+ *1+ Nml N/A  Left PronatorTeres Nml Nml Nml Nml Nml Nml Nml Nml N/A  Left Biceps Nml Nml Nml Nml Nml Nml Nml Nml N/A  Left Triceps Nml Nml Nml Nml Nml Nml Nml Nml N/A  Left Deltoid Nml Nml Nml Nml Nml Nml Nml Nml N/A  Left Abd Dig Min Nml Nml Nml *1- *1+ *1+ *1+ Nml N/A  Left FlexCarpiUln Nml  Nml Nml Nml Nml Nml Nml Nml N/A  Left Ext Indicis Nml Nml Nml Nml Nml Nml Nml Nml N/A  Right 1stDorInt Nml Nml Nml Nml Nml Nml Nml Nml N/A  Right Abd Poll Brev Nml Nml Nml Nml Nml Nml Nml Nml N/A  Right PronatorTeres Nml Nml Nml Nml Nml Nml Nml Nml N/A  Right Biceps Nml Nml Nml Nml Nml Nml Nml Nml N/A  Right Triceps Nml Nml Nml Nml Nml Nml Nml Nml N/A  Right Deltoid Nml Nml Nml Nml Nml Nml Nml Nml N/A      Waveforms:

## 2024-05-13 ENCOUNTER — Other Ambulatory Visit: Payer: Self-pay | Admitting: Family Medicine

## 2024-05-13 DIAGNOSIS — M62542 Muscle wasting and atrophy, not elsewhere classified, left hand: Secondary | ICD-10-CM

## 2024-05-13 DIAGNOSIS — G5602 Carpal tunnel syndrome, left upper limb: Secondary | ICD-10-CM

## 2024-05-23 ENCOUNTER — Encounter: Admitting: Neurology

## 2024-05-27 NOTE — Progress Notes (Unsigned)
 [facility] [address] [place] [address] Phone: [phone] Subjective:   Matthew [name], am serving as a scribe for Dr. Arthea Claudene.  I'm seeing this patient by the request  of:  Geofm Glade PARAS, MD  CC: left thumb pain   YEP:Dlagzrupcz  04/09/2024 Continues to have the thumb numbness.  Is having some atrophy of the thenar eminence.  Discussed the potential for nerve conduction.  Patient would like to try to wear a glove when he does repetitive activity and see if this makes any response otherwise we will consider the possibility of nerve conduction study thereafter. Found to have some swelling noted on ultrasound today that we will need to be monitored.      Update 05/28/2024 Matthew [name] is a 68 y.o. male coming in with complaint of L thumb. Patient states getting ready to have surgery end of Aug 26th. Can we get injection today. R shoulder pain when sleeping on that side.       Past Medical History:  Diagnosis Date   Allergy    RHINITIS   Anemia    Arthritis    Bursitis of left foot 11/11/2016   Colonic polyp 09/2006   colonoscopy   Ganglion 11/02/2017   GERD (gastroesophageal reflux disease)    Golfer's elbow, right 09/25/2019   Gout    Heart beat abnormality 12/14/2017   Heart murmur    Herpes genitalis in men 02/14/2014   Hypertension    Osteitis pubis (HCC) 01/27/2016   Primary osteoarthritis of left knee 11/06/2017   Right groin pain 11/08/2018   SOB (shortness of breath)    per pt, may be coming from new medication   Subluxation of distal radial-ulnar joint 12/03/2019   Subluxation of tendon of long head of biceps 10/15/2016   Injected shoulder 12/19/2016   Subungual hematoma of foot, initial encounter 05/29/2018   SVT (supraventricular tachycardia) (HCC) 03/09/2020   Past Surgical History:  Procedure Laterality Date   COLONOSCOPY  09/2006   KNEE ARTHROSCOPY Left 2011   KNEE SURGERY Left 08/2018   scar tissue surgery    LUMBAR DISC SURGERY  1990   SVT ABLATION N/A 01/09/2020   Procedure: SVT ABLATION;  Surgeon: Inocencio Soyla Lunger, MD;  Location: [facility];  Service: Cardiovascular;  Laterality: N/A;   TOTAL HIP ARTHROPLASTY Left 07/20/2021   Procedure: LEFT TOTAL HIP ARTHROPLASTY ANTERIOR APPROACH;  Surgeon: Sheril Maude, MD;  Location: [facility];  Service: Orthopedics;  Laterality: Left;   TOTAL KNEE ARTHROPLASTY Left 11/06/2017   Procedure: TOTAL KNEE ARTHROPLASTY;  Surgeon: Yvone Rush, MD;  Location: [facility] OR;  Service: Orthopedics;  Laterality: Left;   Social History   Socioeconomic History   Marital status: Married    Spouse name: Not on file   Number of children: 1   Years of education: Not on file   Highest education level: Not on file  Occupational History   Not on file  Tobacco Use   Smoking status: Some Days    Types: Cigars   Smokeless tobacco: Never   Tobacco comments:    occasional cigar  Vaping Use   Vaping status: Never Used  Substance and Sexual Activity   Alcohol use: Yes    Alcohol/week: 3.0 standard drinks of alcohol    Types: 3 Standard drinks or equivalent per week    Comment: daily   Drug use: No   Sexual activity: Yes  Other Topics Concern  Not on file  Social History Narrative   Not on file   Social Drivers of Health   Financial Resource Strain: Low Risk  (04/04/2024)   Received from [facility]   Overall Financial Resource Strain (CARDIA)    Difficulty of Paying Living Expenses: Not hard at all  Food Insecurity: No Food Insecurity (04/04/2024)   Received from [facility]   Hunger Vital Sign    Within the past 12 months, you worried that your food would run out before you got the money to buy more.: Never true    Within the past 12 months, the food you bought just didn't last and you didn't have money to get more.: Never true  Transportation Needs: No Transportation Needs (04/04/2024)   Received from [facility] - Transportation    In the past 12 months, has lack of transportation kept you from medical appointments or from getting medications?: No    Lack of Transportation (Non-Medical): No  Physical Activity: Sufficiently Active (07/17/2020)   Received from [facility]   Exercise Vital Sign    On average, how many days per week do you engage in moderate to strenuous exercise (like a brisk walk)?: 5 days    On average, how many minutes do you engage in exercise at this level?: 60 min  Stress: No Stress Concern Present (07/17/2020)   Received from [facility] - Occupational Stress Questionnaire    Feeling of Stress : Only a little  Social Connections: Unknown (07/17/2020)   Received from [facility]   Social Connection and Isolation Panel    Frequency of Communication with Friends and Family: Not on file    Frequency of Social Gatherings with Friends and Family: Not on file    Attends Religious Services: Not on file    Do you belong to any clubs or organizations such as church groups, unions, fraternal or athletic groups, or school groups?: Yes    Attends Banker Meetings: Not on file    Are you married, widowed, divorced, separated, never married, or living with a partner?: Married   Allergies  Allergen Reactions   Beta Adrenergic Blockers Other (See Comments)    Fatigue   Lisinopril  Cough   Asa [Aspirin ] Rash    High dosage ASA causes break out   Demerol  Itching   Family History  Problem Relation Age of Onset   Stroke Father    Gout Father    Depression Brother    Dermatomyositis Daughter    Colon cancer Neg Hx    Rectal cancer Neg Hx    Esophageal cancer Neg Hx    Pancreatic cancer Neg Hx    Stomach cancer Neg Hx    Liver disease Neg Hx      Current Outpatient Medications (Cardiovascular):    diltiazem  (CARDIZEM  CD) 360 MG 24 hr capsule, TAKE 1 CAPSULE  BY MOUTH EVERY DAY   flecainide (TAMBOCOR) 50 MG tablet, Take 50 mg by mouth 2 (two) times daily.   hydrochlorothiazide  (HYDRODIURIL ) 25 MG tablet, Take 25 mg by mouth daily.   losartan  (COZAAR ) 100 MG tablet, Take 100 mg by mouth daily.   nitroGLYCERIN  (NITRO-DUR ) 0.2 mg/hr patch, Apply 1/4 of a patch to skin once daily.   rosuvastatin  (CRESTOR ) 20 MG tablet, TAKE 1 TABLET BY MOUTH EVERY DAY  Current Outpatient Medications (Respiratory):  fexofenadine (ALLEGRA) 180 MG tablet, Take 180 mg by mouth at bedtime.    fluticasone (FLONASE) 50 MCG/ACT nasal spray, Place into the nose.   montelukast  (SINGULAIR ) 10 MG tablet, TAKE 1 TABLET BY MOUTH EVERY NIGHT AT BEDTIME  Current Outpatient Medications (Analgesics):    allopurinol  (ZYLOPRIM ) 300 MG tablet, TAKE 1 TABLET BY MOUTH EVERY DAY   meloxicam  (MOBIC ) 15 MG tablet, TAKE 1 TABLET BY MOUTH DAILY   Current Outpatient Medications (Other):    cholecalciferol (VITAMIN D ) 1000 units tablet, Take 1,000 Units by mouth daily.   dicyclomine (BENTYL) 10 MG/5ML solution,    doxycycline  (VIBRAMYCIN ) 100 MG capsule, Take 1 capsule (100 mg total) by mouth 2 (two) times daily.   esomeprazole  (NEXIUM ) 40 MG capsule, TAKE 1 CAPSULE (40 MG TOTAL) BY MOUTH 2 (TWO) TIMES DAILY BEFORE A MEAL. TAKE ONE TABLET 30-60 MINUTES BEFORE BREAKFAST   Multiple Vitamin (MULTIVITAMIN) tablet, Take 1 tablet by mouth daily.   TART CHERRY PO, Take by mouth.   traZODone  (DESYREL ) 50 MG tablet, TAKE 1/2 TO 1 TABLET BY MOUTH EVERY NIGHT AT BEDTIME AS NEEDED FOR SLEEP   triamcinolone  (KENALOG ) 0.1 % paste, SMARTSIG:1 TO TEETH Every Night   valACYclovir  (VALTREX ) 500 MG tablet, Take 500 mg by mouth daily.   Reviewed prior external information including notes and imaging from  primary care provider As well as notes that were available from care everywhere and other healthcare systems.  Past medical history, social, surgical and family history all reviewed in electronic medical  record.  No pertanent information unless stated regarding to the chief complaint.   Review of Systems:  No headache, visual changes, nausea, vomiting, diarrhea, constipation, dizziness, abdominal pain, skin rash, fevers, chills, night sweats, weight loss, swollen lymph nodes, body aches, joint swelling, chest pain, shortness of breath, mood changes. POSITIVE muscle aches  Objective  Blood pressure 126/78, pulse 70, height 6' (1.829 m), weight 191 lb (86.6 kg), SpO2 98%.   General: No apparent distress alert and oriented x3 mood and affect normal, dressed appropriately.  HEENT: Pupils equal, extraocular movements intact  Respiratory: Patient's speak in full sentences and does not appear short of breath  Cardiovascular: No lower extremity edema, non tender, no erythema  Thenar eminence wasting noted on the left side. Right shoulder does have positive crossover noted.  Tenderness to palpation over the acromioclavicular joint.  Procedure: Real-time Ultrasound Guided Injection of right acromioclavicular joint Device: GE Logiq Q7 Ultrasound guided injection is preferred based studies that show increased duration, increased effect, greater accuracy, decreased procedural pain, increased response rate, and decreased cost with ultrasound guided versus blind injection.  Verbal informed consent obtained.  Time-out conducted.  Noted no overlying erythema, induration, or other signs of local infection.  Skin prepped in a sterile fashion.  Local anesthesia: Topical Ethyl chloride.  With sterile technique and under real time ultrasound guidance: The 25-gauge half inch needle injected with 0.5 cc of 0.5% Marcaine  and 0.5 cc of Kenalog  40 mg/mL Completed without difficulty  Pain immediately resolved suggesting accurate placement of the medication.  Advised to call if fevers/chills, erythema, induration, drainage, or persistent bleeding.  Impression: Technically successful ultrasound guided injection.    Impression and Recommendations:    The above documentation has been reviewed and is accurate and complete Sui Kasparek M Aseneth Hack, DO

## 2024-05-28 ENCOUNTER — Encounter: Payer: Self-pay | Admitting: Family Medicine

## 2024-05-28 ENCOUNTER — Ambulatory Visit: Admitting: Family Medicine

## 2024-05-28 ENCOUNTER — Other Ambulatory Visit: Payer: Self-pay

## 2024-05-28 VITALS — BP 126/78 | HR 70 | Ht 72.0 in | Wt 191.0 lb

## 2024-05-28 DIAGNOSIS — M79645 Pain in left finger(s): Secondary | ICD-10-CM

## 2024-05-28 DIAGNOSIS — G8929 Other chronic pain: Secondary | ICD-10-CM | POA: Diagnosis not present

## 2024-05-28 DIAGNOSIS — M19011 Primary osteoarthritis, right shoulder: Secondary | ICD-10-CM | POA: Diagnosis not present

## 2024-05-28 DIAGNOSIS — G5602 Carpal tunnel syndrome, left upper limb: Secondary | ICD-10-CM | POA: Diagnosis not present

## 2024-05-28 MED ORDER — METHYLPREDNISOLONE ACETATE 40 MG/ML IJ SUSP
40.0000 mg | Freq: Once | INTRAMUSCULAR | Status: AC
Start: 2024-05-28 — End: 2024-05-28
  Administered 2024-05-28: 40 mg via INTRAMUSCULAR

## 2024-05-28 MED ORDER — KETOROLAC TROMETHAMINE 30 MG/ML IJ SOLN
30.0000 mg | Freq: Once | INTRAMUSCULAR | Status: AC
Start: 2024-05-28 — End: 2024-05-28
  Administered 2024-05-28: 30 mg via INTRAMUSCULAR

## 2024-05-28 NOTE — Assessment & Plan Note (Signed)
 Repeat injection aain 05/28/06 repeat injection today, tolerated the procedure well, discussed icing regimen and home exercises, discussed with patient about icing regimen.  Topical anti-inflammatories.  Follow-up again in 6 to 8 weeks

## 2024-05-28 NOTE — Assessment & Plan Note (Signed)
 Severe overall, does have thenar eminence wasting.  Held on any injection with him having surgery in the next month.  Toradol  and Depo-Medrol  injections given instead.

## 2024-05-28 NOTE — Patient Instructions (Addendum)
 Cocktail injection today See you again in 2-3 months Aug 13th: nurse visit for 1/2 cocktail injection

## 2024-06-10 ENCOUNTER — Other Ambulatory Visit: Payer: Self-pay | Admitting: Family Medicine

## 2024-06-10 ENCOUNTER — Encounter: Payer: Self-pay | Admitting: Family Medicine

## 2024-06-12 ENCOUNTER — Ambulatory Visit (INDEPENDENT_AMBULATORY_CARE_PROVIDER_SITE_OTHER)

## 2024-06-12 ENCOUNTER — Other Ambulatory Visit: Payer: Self-pay | Admitting: Family Medicine

## 2024-06-12 ENCOUNTER — Ambulatory Visit

## 2024-06-12 DIAGNOSIS — G8929 Other chronic pain: Secondary | ICD-10-CM | POA: Diagnosis not present

## 2024-06-12 DIAGNOSIS — M79645 Pain in left finger(s): Secondary | ICD-10-CM | POA: Diagnosis not present

## 2024-06-12 MED ORDER — METHYLPREDNISOLONE ACETATE 40 MG/ML IJ SUSP
40.0000 mg | Freq: Once | INTRAMUSCULAR | Status: AC
Start: 2024-06-12 — End: 2024-06-12
  Administered 2024-06-12 (×2): 40 mg via INTRAMUSCULAR

## 2024-06-12 MED ORDER — KETOROLAC TROMETHAMINE 30 MG/ML IJ SOLN
30.0000 mg | Freq: Once | INTRAMUSCULAR | Status: AC
Start: 2024-06-12 — End: 2024-06-12
  Administered 2024-06-12 (×2): 30 mg via INTRAMUSCULAR

## 2024-06-12 NOTE — Progress Notes (Signed)
 Patient received Toradol  30 mg in left buttocks and Methylprednisolone  40 mg in right buttocks. Patient tolerated well.

## 2024-06-12 NOTE — Progress Notes (Deleted)
 Patient received Toradol  30 mg in left buttocks and Methylprednisolone  40 mg in right buttocks. Patient tolerated well.

## 2024-06-25 ENCOUNTER — Other Ambulatory Visit: Payer: Self-pay

## 2024-06-25 DIAGNOSIS — M25551 Pain in right hip: Secondary | ICD-10-CM

## 2024-07-02 NOTE — Progress Notes (Unsigned)
 Matthew [name] [address] [place] [address] Phone: [phone] Subjective:   Matthew [name], am serving as a scribe for Dr. Arthea [name].  I'm seeing this patient by the request  of:  Geofm Glade PARAS, MD  CC: left shoulder pain   YEP:Dlagzrupcz  Matthew [name] is a 68 y.o. male coming in with complaint of L shoulder pain. Injected AC on 05/28/2024. Patient states R hip has been flared for about a month and R 3rd finger pain over DIP.SABRA Thinks it maybe gout.  Seen ortman had carpal tunnel release last follow up 9/2    Past Medical History:  Diagnosis Date   Allergy    RHINITIS   Anemia    Arthritis    Bursitis of left foot 11/11/2016   Colonic polyp 09/2006   colonoscopy   Ganglion 11/02/2017   GERD (gastroesophageal reflux disease)    Golfer's elbow, right 09/25/2019   Gout    Heart beat abnormality 12/14/2017   Heart murmur    Herpes genitalis in men 02/14/2014   Hypertension    Osteitis pubis (HCC) 01/27/2016   Primary osteoarthritis of left knee 11/06/2017   Right groin pain 11/08/2018   SOB (shortness of breath)    per pt, may be coming from new medication   Subluxation of distal radial-ulnar joint 12/03/2019   Subluxation of tendon of long head of biceps 10/15/2016   Injected shoulder 12/19/2016   Subungual hematoma of foot, initial encounter 05/29/2018   SVT (supraventricular tachycardia) (HCC) 03/09/2020   Past Surgical History:  Procedure Laterality Date   COLONOSCOPY  09/2006   KNEE ARTHROSCOPY Left 2011   KNEE SURGERY Left 08/2018   scar tissue surgery   LUMBAR DISC SURGERY  1990   SVT ABLATION N/A 01/09/2020   Procedure: SVT ABLATION;  Surgeon: Inocencio Soyla Lunger, MD;  Location: [facility];  Service: Cardiovascular;  Laterality: N/A;   TOTAL HIP ARTHROPLASTY Left 07/20/2021   Procedure: LEFT TOTAL HIP ARTHROPLASTY ANTERIOR APPROACH;  Surgeon: Sheril Maude, MD;  Location: [facility];  Service: Orthopedics;  Laterality:  Left;   TOTAL KNEE ARTHROPLASTY Left 11/06/2017   Procedure: TOTAL KNEE ARTHROPLASTY;  Surgeon: Yvone Rush, MD;  Location: [facility];  Service: Orthopedics;  Laterality: Left;   Social History   Socioeconomic History   Marital status: Married    Spouse name: Not on file   Number of children: 1   Years of education: Not on file   Highest education level: Not on file  Occupational History   Not on file  Tobacco Use   Smoking status: Some Days    Types: Cigars   Smokeless tobacco: Never   Tobacco comments:    occasional cigar  Vaping Use   Vaping status: Never Used  Substance and Sexual Activity   Alcohol use: Yes    Alcohol/week: 3.0 standard drinks of alcohol    Types: 3 Standard drinks or equivalent per week    Comment: daily   Drug use: No   Sexual activity: Yes  Other Topics Concern   Not on file  Social History Narrative   Not on file   Social Drivers of Health   Financial Resource Strain: Low Risk  (04/04/2024)   Received from [facility]   Overall Financial Resource Strain (CARDIA)    Difficulty of Paying Living Expenses: Not hard at all  Food Insecurity: No Food Insecurity (04/04/2024)   Received from [facility]   Hunger Vital Sign    Within the past 12 months, you worried that your food would run out before you got the money to buy more.: Never true    Within the past 12 months, the food you bought just didn't last and you didn't have money to get more.: Never true  Transportation Needs: No Transportation Needs (04/04/2024)   Received from [facility] - Transportation    In the past 12 months, has lack of transportation kept you from medical appointments or from getting medications?: No    Lack of Transportation (Non-Medical): No  Physical Activity: Sufficiently Active (07/17/2020)   Received from [facility]   Exercise Vital Sign    On average, how many days per week do you engage in  moderate to strenuous exercise (like a brisk walk)?: 5 days    On average, how many minutes do you engage in exercise at this level?: 60 min  Stress: No Stress Concern Present (07/17/2020)   Received from [facility] - Occupational Stress Questionnaire    Feeling of Stress : Only a little  Social Connections: Unknown (07/17/2020)   Received from [facility]   Social Connection and Isolation Panel    Frequency of Communication with Friends and Family: Not on file    Frequency of Social Gatherings with Friends and Family: Not on file    Attends Religious Services: Not on file    Do you belong to any clubs or organizations such as church groups, unions, fraternal or athletic groups, or school groups?: Yes    Attends Banker Meetings: Not on file    Are you married, widowed, divorced, separated, never married, or living with a partner?: Married   Allergies  Allergen Reactions   Beta Adrenergic Blockers Other (See Comments)    Fatigue   Lisinopril  Cough   Asa [Aspirin ] Rash    High dosage ASA causes break out   Demerol  Itching   Family History  Problem Relation Age of Onset   Stroke Father    Gout Father    Depression Brother    Dermatomyositis Daughter    Colon cancer Neg Hx    Rectal cancer Neg Hx    Esophageal cancer Neg Hx    Pancreatic cancer Neg Hx    Stomach cancer Neg Hx    Liver disease Neg Hx      Current Outpatient Medications (Cardiovascular):    diltiazem  (CARDIZEM  CD) 360 MG 24 hr capsule, TAKE 1 CAPSULE BY MOUTH EVERY DAY   flecainide (TAMBOCOR) 50 MG tablet, Take 50 mg by mouth 2 (two) times daily.   hydrochlorothiazide  (HYDRODIURIL ) 25 MG tablet, Take 25 mg by mouth daily.   losartan  (COZAAR ) 100 MG tablet, Take 100 mg by mouth daily.   nitroGLYCERIN  (NITRO-DUR ) 0.2 mg/hr patch, Apply 1/4 of a patch to skin once daily.   rosuvastatin  (CRESTOR ) 20 MG tablet, TAKE 1 TABLET  BY MOUTH EVERY DAY  Current Outpatient Medications (Respiratory):    fexofenadine (ALLEGRA) 180 MG tablet, Take 180 mg by mouth at bedtime.    fluticasone (FLONASE) 50 MCG/ACT nasal spray, Place into the nose.   montelukast  (SINGULAIR ) 10 MG tablet, TAKE 1 TABLET BY MOUTH EVERY NIGHT AT BEDTIME  Current Outpatient Medications (Analgesics):    allopurinol  (ZYLOPRIM ) 300 MG tablet, TAKE 1 TABLET BY MOUTH EVERY DAY   meloxicam  (MOBIC ) 15 MG tablet,  TAKE 1 TABLET BY MOUTH DAILY   Current Outpatient Medications (Other):    cholecalciferol (VITAMIN D ) 1000 units tablet, Take 1,000 Units by mouth daily.   dicyclomine (BENTYL) 10 MG/5ML solution,    doxycycline  (VIBRAMYCIN ) 100 MG capsule, Take 1 capsule (100 mg total) by mouth 2 (two) times daily.   esomeprazole  (NEXIUM ) 40 MG capsule, TAKE 1 CAPSULE (40 MG TOTAL) BY MOUTH 2 (TWO) TIMES DAILY BEFORE A MEAL. TAKE ONE TABLET 30-60 MINUTES BEFORE BREAKFAST   Multiple Vitamin (MULTIVITAMIN) tablet, Take 1 tablet by mouth daily.   TART CHERRY PO, Take by mouth.   traZODone  (DESYREL ) 50 MG tablet, TAKE A HALF TO 1 TABLET BY MOUTH EVERY NIGHT AT BEDTIME AS NEEDED FOR SLEEP   triamcinolone  (KENALOG ) 0.1 % paste, SMARTSIG:1 TO TEETH Every Night   valACYclovir  (VALTREX ) 500 MG tablet, Take 500 mg by mouth daily.   Reviewed prior external information including notes and imaging from  primary care provider As well as notes that were available from care everywhere and other healthcare systems.  Past medical history, social, surgical and family history all reviewed in electronic medical record.  No pertanent information unless stated regarding to the chief complaint.   Review of Systems:  No headache, visual changes, nausea, vomiting, diarrhea, constipation, dizziness, abdominal pain, skin rash, fevers, chills, night sweats, weight loss, swollen lymph nodes, body aches, joint swelling, chest pain, shortness of breath, mood changes. POSITIVE muscle  aches  Objective  Blood pressure 116/70, pulse 75, height 6' (1.829 m), weight 187 lb (84.8 kg), SpO2 98%.   General: No apparent distress alert and oriented x3 mood and affect normal, dressed appropriately.  HEENT: Pupils equal, extraocular movements intact  Respiratory: Patient's speak in full sentences and does not appear short of breath  Cardiovascular: No lower extremity edema, non tender, no erythema  Right middle finger and DIP has swelling noted.  This seems to be around the soft tissue though.  Tender to palpation noted. Right hip exam shows significant decrease in internal range of motion of the hip.  Patient has an antalgic gait noted.  Limited muscular skeletal ultrasound was performed and interpreted by [name] HUSSAR, M  Limited ultrasound of the DIP soft tissue hypoechoic changes with increasing in Doppler flow consistent with a potential soft tissue formation while patient is joint appears to be unremarkable.  No significant calcific deposition noted Impression: Questionable cellulitic changes  Procedure: Real-time Ultrasound Guided Injection of right hip Device: GE Logiq Q7  Ultrasound guided injection is preferred based studies that show increased duration, increased effect, greater accuracy, decreased procedural pain, increased response rate with ultrasound guided versus blind injection.  Verbal informed consent obtained.  Time-out conducted.  Noted no overlying erythema, induration, or other signs of local infection.  Skin prepped in a sterile fashion.  Local anesthesia: Topical Ethyl chloride.  With sterile technique and under real time ultrasound guidance:  Anterior capsule visualized, needle visualized going to the head neck junction at the anterior capsule. Pictures taken. Patient did have injection of  2 cc of 0.5% Marcaine , and 1 cc of Kenalog  40 mg/dL. Completed without difficulty  Pain immediately improved suggesting accurate placement of the medication.   Advised to call if fevers/chills, erythema, induration, drainage, or persistent bleeding.  Images permanently stored  Impression: Technically successful ultrasound guided injection.   Impression and Recommendations:    The above documentation has been reviewed and is accurate and complete Kalany Diekmann M Uchechukwu Dhawan, DO

## 2024-07-03 ENCOUNTER — Encounter: Payer: Self-pay | Admitting: Family Medicine

## 2024-07-03 ENCOUNTER — Ambulatory Visit: Admitting: Family Medicine

## 2024-07-03 ENCOUNTER — Ambulatory Visit

## 2024-07-03 ENCOUNTER — Other Ambulatory Visit: Payer: Self-pay

## 2024-07-03 VITALS — BP 116/70 | HR 75 | Ht 72.0 in | Wt 187.0 lb

## 2024-07-03 DIAGNOSIS — M79644 Pain in right finger(s): Secondary | ICD-10-CM

## 2024-07-03 DIAGNOSIS — M25551 Pain in right hip: Secondary | ICD-10-CM | POA: Diagnosis not present

## 2024-07-03 DIAGNOSIS — M1611 Unilateral primary osteoarthritis, right hip: Secondary | ICD-10-CM

## 2024-07-03 DIAGNOSIS — L089 Local infection of the skin and subcutaneous tissue, unspecified: Secondary | ICD-10-CM | POA: Insufficient documentation

## 2024-07-03 MED ORDER — DOXYCYCLINE HYCLATE 100 MG PO CAPS: 100.0000 mg | ORAL_CAPSULE | Freq: Two times a day (BID) | ORAL | 0 refills | Status: AC

## 2024-07-03 NOTE — Assessment & Plan Note (Signed)
 On ultrasound seems to have an infection of the DIP joint noted.  Discussed icing regimen and home exercises, discussed which activities to do and which ones to avoid.  Doxycycline  100 mg twice daily for 10 days.  Prescribed today.

## 2024-07-03 NOTE — Assessment & Plan Note (Signed)
 Patient is looking to avoid surgical intervention.  Would like to wait till February of next year.  That is 5 months away.  Fairly severe arthritis on x-rays.  Discussed with patient though about had a fracture on the contralateral side previously before the replacement.  Hoping we would not get to that stage again.  Discussed with patient about icing regimen and home exercises, discussed which activities to do and which ones to avoid.  Increase activity slowly.  Discussed icing regimen.  Will follow-up again in 6 to 8 weeks otherwise.  Can repeat injection every 10 weeks if necessary.

## 2024-07-03 NOTE — Patient Instructions (Signed)
 Injection in hip today See you again in 10 weeks

## 2024-07-06 ENCOUNTER — Other Ambulatory Visit: Payer: Self-pay | Admitting: Family Medicine

## 2024-07-11 ENCOUNTER — Ambulatory Visit: Admitting: Family Medicine

## 2024-07-17 ENCOUNTER — Other Ambulatory Visit: Payer: Self-pay | Admitting: Family Medicine

## 2024-08-04 ENCOUNTER — Other Ambulatory Visit: Payer: Self-pay | Admitting: Family Medicine

## 2024-08-07 LAB — COLOGUARD: COLOGUARD: NEGATIVE

## 2024-08-17 ENCOUNTER — Encounter: Payer: Self-pay | Admitting: Family Medicine

## 2024-08-19 ENCOUNTER — Other Ambulatory Visit: Payer: Self-pay

## 2024-08-19 MED ORDER — PREDNISONE 20 MG PO TABS: 40.0000 mg | ORAL_TABLET | Freq: Every day | ORAL | 0 refills | Status: AC

## 2024-09-02 ENCOUNTER — Encounter: Payer: Self-pay | Admitting: Family Medicine

## 2024-09-02 NOTE — Progress Notes (Signed)
 Surgery orders requested via Epic inbox.

## 2024-09-03 ENCOUNTER — Other Ambulatory Visit: Payer: Self-pay | Admitting: Orthopaedic Surgery

## 2024-09-04 ENCOUNTER — Ambulatory Visit: Admitting: Family Medicine

## 2024-09-04 ENCOUNTER — Other Ambulatory Visit: Payer: Self-pay | Admitting: Family Medicine

## 2024-09-05 NOTE — Progress Notes (Signed)
 DERMATOLOGY INTAKE   Chief Complaint  Patient presents with  . Skin Lesion    TCA Peel on scalp    HISTORY OF PRESENT ILLNESS    Matthew [name] is a 68 y.o. male who presents for the following:  Reason for visit: TCA Peel Duration: 56yr  Modifying Factors (makes better/worse): no modifying factors Prior workup: none Therapies tried: none   Additional concerns: No other skin complaints today Does patient request a chaperone to be present during the exam? no

## 2024-09-05 NOTE — Progress Notes (Signed)
 The following solution has been prepared by Verdon VEAR Dare, CMA for administration by Powell Doffing, PA-C Solution: Trichloroacetic Acid 30% Item number: [id number] Lot: [id number] Expiration Date: 08/21/2025

## 2024-09-06 NOTE — Patient Instructions (Signed)
 SURGICAL WAITING ROOM VISITATION  Patients having surgery or a procedure may have no more than 2 support people in the waiting area - these visitors may rotate.    Children under the age of 13 must have an adult with them who is not the patient.  Visitors with respiratory illnesses are discouraged from visiting and should remain at home.  If the patient needs to stay at the hospital during part of their recovery, the visitor guidelines for inpatient rooms apply. Pre-op nurse will coordinate an appropriate time for 1 support person to accompany patient in pre-op.  This support person may not rotate.    Please refer to the [facility] website for the visitor guidelines for Inpatients (after your surgery is over and you are in a regular room).       Your procedure is scheduled on: 09/17/24   Report to [facility] Main Entrance    Report to admitting at 7:30 AM   Call this number if you have problems the morning of surgery [phone]   Do not eat food :After Midnight.   After Midnight you may have the following liquids until 7 AM DAY OF SURGERY  Water  Non-Citrus Juices (without pulp, NO RED-Apple, White grape, White cranberry) Black Coffee (NO MILK/CREAM OR CREAMERS, sugar ok)  Clear Tea (NO MILK/CREAM OR CREAMERS, sugar ok) regular and decaf                             Plain Jell-O (NO RED)                                           Fruit ices (not with fruit pulp, NO RED)                                     Popsicles (NO RED)                                                               Sports drinks like Gatorade (NO RED)                   The day of surgery:  Drink ONE (1) Pre-Surgery Clear Ensure  at 7 AM the morning of surgery. Drink in one sitting. Do not sip.  This drink was given to you during your hospital  pre-op appointment visit. Nothing else to drink after completing the  Pre-Surgery Clear Ensure.    Oral Hygiene is also important to reduce your risk  of infection.                                    Remember - BRUSH YOUR TEETH THE MORNING OF SURGERY WITH YOUR REGULAR TOOTHPASTE   Stop all vitamins and herbal supplements 7 days before surgery.   Take these medicines the morning of surgery with A SIP OF WATER : allopurinol , diltiazem , esomeprazole (nexium ), fexofenadine, flecainide, nasal spray, prednisone , rosuvastatin .  Do not take Bentyl, hydrochlorothiazide  or Losartan  the morning of surgery.              You may not have any metal on your body including hair pins, jewelry, and body piercing             Do not wear make-up, lotions, powders, perfumes/cologne, or deodorant              Men may shave face and neck.   Do not bring valuables to the hospital. [facility] IS NOT             RESPONSIBLE   FOR VALUABLES.   Contacts, glasses, dentures or bridgework may not be worn into surgery.  DO NOT BRING YOUR HOME MEDICATIONS TO THE HOSPITAL. PHARMACY WILL DISPENSE MEDICATIONS LISTED ON YOUR MEDICATION LIST TO YOU DURING YOUR ADMISSION IN THE HOSPITAL!    Patients discharged on the day of surgery will not be allowed to drive home.  Someone NEEDS to stay with you for the first 24 hours after anesthesia.   Special Instructions: Bring a copy of your healthcare power of attorney and living will documents the day of surgery if you haven't scanned them before.              Please read over the following fact sheets you were given: IF YOU HAVE QUESTIONS ABOUT YOUR PRE-OP INSTRUCTIONS PLEASE CALL [phone] [place]   If you received a COVID test during your pre-op visit  it is requested that you wear a mask when out in public, stay away from anyone that may not be feeling well and notify your surgeon if you develop symptoms. If you test positive for Covid or have been in contact with anyone that has tested positive in the last 10 days please notify you surgeon.      Pre-operative 4 CHG Bath Instructions  DYNA-Hex 4  Chlorhexidine  Gluconate 4% Solution Antiseptic 4 fl. oz   You can play a key role in reducing the risk of infection after surgery. Your skin needs to be as free of germs as possible. You can reduce the number of germs on your skin by washing with CHG (chlorhexidine  gluconate) soap before surgery. CHG is an antiseptic soap that kills germs and continues to kill germs even after washing.   DO NOT use if you have an allergy to chlorhexidine /CHG or antibacterial soaps. If your skin becomes reddened or irritated, stop using the CHG and notify one of our RNs at   Please shower with the CHG soap starting 4 days before surgery using the following schedule:     Please keep in mind the following:  DO NOT shave, including legs and underarms, starting the day of your first shower.   You may shave your face at any point before/day of surgery.  Place clean sheets on your bed the day you start using CHG soap. Use a clean washcloth (not used since being washed) for each shower. DO NOT sleep with pets once you start using the CHG.  CHG Shower Instructions:  If you choose to wash your hair and private area, wash first with your normal shampoo/soap.  After you use shampoo/soap, rinse your hair and body thoroughly to remove shampoo/soap residue.  Turn the water  OFF and apply about 3 tablespoons (45 ml) of CHG soap to a CLEAN washcloth.  Apply CHG soap ONLY FROM YOUR NECK DOWN TO YOUR TOES (washing for 3-5 minutes)  DO NOT use CHG soap on face,  private areas, open wounds, or sores.  Pay special attention to the area where your surgery is being performed.  If you are having back surgery, having someone wash your back for you may be helpful. Wait 2 minutes after CHG soap is applied, then you may rinse off the CHG soap.  Pat dry with a clean towel  Put on clean clothes/pajamas   If you choose to wear lotion, please use ONLY the CHG-compatible lotions on the back of this paper.     Additional instructions for  the day of surgery: DO NOT APPLY any lotions, deodorants, cologne, or perfumes.   Put on clean/comfortable clothes.  Brush your teeth.  Ask your nurse before applying any prescription medications to the skin.   CHG Compatible Lotions   Aveeno Moisturizing lotion  Cetaphil Moisturizing Cream  Cetaphil Moisturizing Lotion  Clairol Herbal Essence Moisturizing Lotion, Dry Skin  Clairol Herbal Essence Moisturizing Lotion, Extra Dry Skin  Clairol Herbal Essence Moisturizing Lotion, Normal Skin  Curel Age Defying Therapeutic Moisturizing Lotion with Alpha Hydroxy  Curel Extreme Care Body Lotion  Curel Soothing Hands Moisturizing Hand Lotion  Curel Therapeutic Moisturizing Cream, Fragrance-Free  Curel Therapeutic Moisturizing Lotion, Fragrance-Free  Curel Therapeutic Moisturizing Lotion, Original Formula  Eucerin Daily Replenishing Lotion  Eucerin Dry Skin Therapy Plus Alpha Hydroxy Crme  Eucerin Dry Skin Therapy Plus Alpha Hydroxy Lotion  Eucerin Original Crme  Eucerin Original Lotion  Eucerin Plus Crme Eucerin Plus Lotion  Eucerin TriLipid Replenishing Lotion  Keri Anti-Bacterial Hand Lotion  Keri Deep Conditioning Original Lotion Dry Skin Formula Softly Scented  Keri Deep Conditioning Original Lotion, Fragrance Free Sensitive Skin Formula  Keri Lotion Fast Absorbing Fragrance Free Sensitive Skin Formula  Keri Lotion Fast Absorbing Softly Scented Dry Skin Formula  Keri Original Lotion  Keri Skin Renewal Lotion Keri Silky Smooth Lotion  Keri Silky Smooth Sensitive Skin Lotion  Nivea Body Creamy Conditioning Oil  Nivea Body Extra Enriched Lotion  Nivea Body Original Lotion  Nivea Body Sheer Moisturizing Lotion Nivea Crme  Nivea Skin Firming Lotion  NutraDerm 30 Skin Lotion  NutraDerm Skin Lotion  NutraDerm Therapeutic Skin Cream  NutraDerm Therapeutic Skin Lotion  ProShield Protective Hand Cream  Incentive Spirometer  An incentive spirometer is a tool that can help keep  your lungs clear and active. This tool measures how well you are filling your lungs with each breath. Taking long deep breaths may help reverse or decrease the chance of developing breathing (pulmonary) problems (especially infection) following: A long period of time when you are unable to move or be active. BEFORE THE PROCEDURE  If the spirometer includes an indicator to show your best effort, your nurse or respiratory therapist will set it to a desired goal. If possible, sit up straight or lean slightly forward. Try not to slouch. Hold the incentive spirometer in an upright position. INSTRUCTIONS FOR USE  Sit on the edge of your bed if possible, or sit up as far as you can in bed or on a chair. Hold the incentive spirometer in an upright position. Breathe out normally. Place the mouthpiece in your mouth and seal your lips tightly around it. Breathe in slowly and as deeply as possible, raising the piston or the ball toward the top of the column. Hold your breath for 3-5 seconds or for as long as possible. Allow the piston or ball to fall to the bottom of the column. Remove the mouthpiece from your mouth and breathe out normally. Rest for a few  seconds and repeat Steps 1 through 7 at least 10 times every 1-2 hours when you are awake. Take your time and take a few normal breaths between deep breaths. The spirometer may include an indicator to show your best effort. Use the indicator as a goal to work toward during each repetition. After each set of 10 deep breaths, practice coughing to be sure your lungs are clear. If you have an incision (the cut made at the time of surgery), support your incision when coughing by placing a pillow or rolled up towels firmly against it. Once you are able to get out of bed, walk around indoors and cough well. You may stop using the incentive spirometer when instructed by your caregiver.  RISKS AND COMPLICATIONS Take your time so you do not get dizzy or  light-headed. If you are in pain, you may need to take or ask for pain medication before doing incentive spirometry. It is harder to take a deep breath if you are having pain. AFTER USE Rest and breathe slowly and easily. It can be helpful to keep track of a log of your progress. Your caregiver can provide you with a simple table to help with this. If you are using the spirometer at home, follow these instructions: SEEK MEDICAL CARE IF:  You are having difficultly using the spirometer. You have trouble using the spirometer as often as instructed. Your pain medication is not giving enough relief while using the spirometer. You develop fever of 100.5 F (38.1 C) or higher. SEEK IMMEDIATE MEDICAL CARE IF:  You cough up bloody sputum that had not been present before. You develop fever of 102 F (38.9 C) or greater. You develop worsening pain at or near the incision site. MAKE SURE YOU:  Understand these instructions. Will watch your condition. Will get help right away if you are not doing well or get worse. Document Released: 02/27/2007 Document Revised: 01/09/2012 Document Reviewed: 04/30/2007   WHAT IS A BLOOD TRANSFUSION? Blood Transfusion Information  A transfusion is the replacement of blood or some of its parts. Blood is made up of multiple cells which provide different functions. Red blood cells carry oxygen and are used for blood loss replacement. White blood cells fight against infection. Platelets control bleeding. Plasma helps clot blood. Other blood products are available for specialized needs, such as hemophilia or other clotting disorders. BEFORE THE TRANSFUSION  Who gives blood for transfusions?  Healthy volunteers who are fully evaluated to make sure their blood is safe. This is blood bank blood. Transfusion therapy is the safest it has ever been in the practice of medicine. Before blood is taken from a donor, a complete history is taken to make sure that person has no  history of diseases nor engages in risky social behavior (examples are intravenous drug use or sexual activity with multiple partners). The donor's travel history is screened to minimize risk of transmitting infections, such as malaria. The donated blood is tested for signs of infectious diseases, such as HIV and hepatitis. The blood is then tested to be sure it is compatible with you in order to minimize the chance of a transfusion reaction. If you or a relative donates blood, this is often done in anticipation of surgery and is not appropriate for emergency situations. It takes many days to process the donated blood. RISKS AND COMPLICATIONS Although transfusion therapy is very safe and saves many lives, the main dangers of transfusion include:  Getting an infectious disease. Developing a transfusion  reaction. This is an allergic reaction to something in the blood you were given. Every precaution is taken to prevent this. The decision to have a blood transfusion has been considered carefully by your caregiver before blood is given. Blood is not given unless the benefits outweigh the risks. AFTER THE TRANSFUSION Right after receiving a blood transfusion, you will usually feel much better and more energetic. This is especially true if your red blood cells have gotten low (anemic). The transfusion raises the level of the red blood cells which carry oxygen, and this usually causes an energy increase. The nurse administering the transfusion will monitor you carefully for complications. HOME CARE INSTRUCTIONS  No special instructions are needed after a transfusion. You may find your energy is better. Speak with your caregiver about any limitations on activity for underlying diseases you may have. SEEK MEDICAL CARE IF:  Your condition is not improving after your transfusion. You develop redness or irritation at the intravenous (IV) site. SEEK IMMEDIATE MEDICAL CARE IF:  Any of the following symptoms occur  over the next 12 hours: Shaking chills. You have a temperature by mouth above 102 F (38.9 C), not controlled by medicine. Chest, back, or muscle pain. People around you feel you are not acting correctly or are confused. [name] or difficulty breathing. Dizziness and fainting. You get a rash or develop hives. You have a decrease in urine output. Your urine turns a dark color or changes to pink, red, or brown. Any of the following symptoms occur over the next 10 days: You have a temperature by mouth above 102 F (38.9 C), not controlled by medicine. [name]. Weakness after normal activity. The white part of the eye turns yellow (jaundice). You have a decrease in the amount of urine or are urinating less often. Your urine turns a dark color or changes to pink, red, or brown. Document Released: 10/14/2000 Document Revised: 01/09/2012 Document Reviewed: 06/02/2008 [facility] Patient Information 2014 [place], [place].

## 2024-09-06 NOTE — Progress Notes (Signed)
 Dermatology Problem List 1. History of NMSC             BCC left posterior shoulder, s/p ED&C 09/16/2020  2. Actinic keratoses              Efudex scalp, temples and upper left abdomen 2021 and scalp 2022, 2023 and 2024  Dermatology Patient Visit  Patient contact information:    [phone] (home) [phone] (work), He gives us  permission to leave a message at this number.  Chief complaint:Skin Lesion (TCA Peel on scalp and spot on neck.)   History of present illness: [name] is a 68 y.o. male who presents for (1) TCA peel of scalp.  (2) He notes lesion of concern on his left neck.   Medications: Current Outpatient Medications  Medication Sig Dispense Refill  . allopurinoL  (ZYLOPRIM ) 300 MG tablet Take 1 tablet (300 mg total) by mouth once daily for 360 days 90 tablet 3  . amoxicillin  (AMOXIL ) 500 MG capsule Take 2,000 mg by mouth once as needed Take 1 hr before dental work    . C/sourcherry/celery/grape seed (TART CHERRY ORAL) Take 3 capsules by mouth once daily    . cholecalciferol, vitamin D3, (VITAMIN D3 ORAL) Take 1 tablet by mouth once daily    . dilTIAZem  (CARDIZEM  CD) 360 MG CD capsule TAKE 1 CAPSULE BY MOUTH DAILY 90 capsule 3  . docosahexaenoic acid/epa (FISH OIL ORAL) Take 2 capsules by mouth once daily    . esomeprazole  (NEXIUM ) 20 MG DR capsule Take 40 mg by mouth 2 (two) times daily *Two 20mg  capsules, two times per day    . ferrous sulfate  (IRON ORAL) Take 1 tablet by mouth every other day    . flecainide (TAMBOCOR) 50 MG tablet TAKE 1 TABLET BY MOUTH 2 TIMES A DAY 180 tablet 1  . fluorouraciL (EFUDEX) 5 % cream Apply to top of scalp 2 x day for 2-4 weeks, until brisk reaction develops. 40 g 2  . fluticasone propionate (FLONASE) 50 mcg/actuation nasal spray Place 1 spray into both nostrils every morning    . hydrALAZINE (APRESOLINE) 10 MG tablet TAKE 1 TABLET BY MOUTH 3 TIMES A DAY 270 tablet 3  . hydroCHLOROthiazide  (HYDRODIURIL ) 25 MG tablet TAKE 1  TABLET BY MOUTH DAILY 90 tablet 3  . losartan  (COZAAR ) 100 MG tablet TAKE 1 TABLET BY MOUTH DAILY 90 tablet 3  . MAGNESIUM  GLYCINATE ORAL Take 500 mg by mouth at bedtime    . meloxicam  (MOBIC ) 15 MG tablet Take 1 tablet by mouth Take 5 days weekly    . montelukast  (SINGULAIR ) 10 mg tablet Take 1 tablet (10 mg total) by mouth at bedtime    . multivitamin tablet Take 1 tablet by mouth once daily    . rosuvastatin  (CRESTOR ) 20 MG tablet TAKE 1 TABLET BY MOUTH DAILY 90 tablet 3  . traZODone  (DESYREL ) 50 MG tablet Take 1 tablet (50 mg total) by mouth at bedtime 3 tablet 0  . triamcinolone  acetonide (ORALONE ) 0.1 % paste as needed For mouth sores    . valACYclovir  (VALTREX ) 500 MG tablet TAKE 1 TABLET BY MOUTH DAILY 90 tablet 3   No current facility-administered medications for this visit.    Allergies: Allergies  Allergen Reactions  . Beta-Blockers (Beta-Adrenergic Blocking Agts) Other (See Comments)    Fatigue  . Demerol  [Meperidine ] Itching  . Lisinopril  Cough  . Aspirin  Rash    High dosage ASA causes break out    Past medical history: Past Medical  History:  Diagnosis Date  . Anemia 2015  . Arrhythmia   . Atrial fibrillation (CMS/HHS-HCC)   . GERD (gastroesophageal reflux disease) 2010  . Gout, joint 2010  . Hyperlipidemia   . Hypertension   . Osteoarthritis of multiple joints 04/21/2024  . Supraventricular tachycardia (CMS/HHS-HCC)     Family history:  Family History  Problem Relation Name Age of Onset  . Bipolar disorder Brother Jerilynn   . Depression Brother Jerilynn   . Stroke Father RD   . Bipolar disorder Father RD   . Gout Father RD   . Alcohol abuse Mother    . Dermatomyositis Paternal Grandfather         Dx as juvenille DM  . No Known Problems Brother       Social history: reports that he has been smoking cigars. He has never been exposed to tobacco smoke. He has never used smokeless tobacco. He reports current alcohol use of about 7.0 standard drinks of  alcohol per week. He reports that he does not use drugs.  Review of Systems: No other skin lesions or dermatoses of concern.   Physical exam:    Vitals:   09/05/24 1513  PainSc: 0-No pain   He is well-appearing, in no acute distress, and well-groomed.  Alert and oriented x 3, with pleasant and appropriate mood and affect.  Examination with palpation of the scalp and neck performed.  Exam of these areas yielded the following findings:   Superior scalp with many keratotic and hypkeratotic papules and plaques. Left superior posterior scalp in hair bearing margin with ulceration.   Left lateral neck with inflamed well circumscribed papule with milia like cysts.   Impression and plan: 1. Actinic keratoses Note: Superior Scalp  TCA CHEMICAL PEEL  Procedure: trichloroacetic acid chemoexfoliation (chemical peel) for treatment of confluent actinic keratosis (>15 lesions) on  [x]  Scalp []  Forehead []  Lower Face  []  Forearms []  Hands  []  Patient was given eczema herpeticum prophylaxis   [x]  Verbal consent was obtained from patient after discussing risks and benefits of the procedure including burning sensation, pain, swelling, risk for infection,  skin discoloration and/or scarring.   TCA peel was performed as follows:  [x]  Area was primed and degreased in the usual fashion with acetone  []  Jessner's solution was applied to area using 4x4 gauze in circular fashion, followed by [x]  30% TCA acid was applied to area using 4x4 gauze in circular fashion [x]  Affected areas responded with white frosting. Towels presoaked in ice water  were applied with pressure until all stinging was gone. Patient tolerated procedure well and without complications. Area was coated with petrolatum ointment and post-procedure wound care instructions provided to patient.. >15 actinic keratoses were treated in this manner  Note: minimal frosting noted   2. Seborrheic keratoses, inflamed Note: Left  Neck -Recommend treatment of lesions noted on exam today consistent with irritated seborrheic keratoses as they are symptomatic to patient and should resolve with cryotherapy.   -Inflamed and/or symptomatic seborrheic keratoses were treated with liquid nitrogen today x  2 cycles each after verbal consent was obtained from the patient including discussion of the risks including but not limited to scar, post-inflammatory pigment changes, infection, blister, pain, recurrence, and further treatment.  Wound care instructions discussed. -Instructed patient to notify me if not resolved by 84-month > would biopsy  Return for As Scheduled .  Attestation Statement:   I personally performed the service, non-incident to. (WP)   HEATHER PASTOR Nelson, PA  This note has been created using automated tools and reviewed for accuracy by [facility] PASTOR SCIMECA.

## 2024-09-06 NOTE — Progress Notes (Addendum)
 COVID Vaccine received:  []  No [x]  Yes Date of any COVID positive Test in last 90 days: no PCP - Dr. Marcos Saris At [facility] Cardiologist - Dr. Bernardino at [facility] Dr. Melchor At [facility]  Chest x-ray -  EKG -  09/09/24 Epic Stress Test -  ECHO - 12/26/17 Epic Cardiac Cath -   Bowel Prep - [x]  No  []   Yes ______  Pacemaker / ICD device [x]  No []  Yes   Spinal Cord Stimulator:[x]  No []  Yes       History of Sleep Apnea? [x]  No []  Yes   CPAP used?- [x]  No []  Yes    Does the patient monitor blood sugar?          [x]  No []  Yes  []  N/A  Patient has: [x]  NO Hx DM   []  Pre-DM                 []  DM1  []   DM2 Does patient have a [employer] or Dexacom? []  No []  Yes   Fasting Blood Sugar Ranges-  Checks Blood Sugar _____ times a day  GLP1 agonist / usual dose - no GLP1 instructions:  SGLT-2 inhibitors / usual dose - no SGLT-2 instructions:   Blood Thinner / Instructions:no Aspirin  Instructions:no  Comments:   Activity level: Patient is able to climb a flight of stairs without difficulty; [x]  No CP  [x]  No SOB,    Patient can perform ADLs without assistance.   Anesthesia review: murmur, HTN, SVT, A-fib, prediabetic  Patient denies shortness of breath, fever, cough and chest pain at PAT appointment.  Patient verbalized understanding and agreement to the Pre-Surgical Instructions that were given to them at this PAT appointment. Patient was also educated of the need to review these PAT instructions again prior to his/her surgery.I reviewed the appropriate phone numbers to call if they have any and questions or concerns.

## 2024-09-09 ENCOUNTER — Encounter (HOSPITAL_COMMUNITY)
Admission: RE | Admit: 2024-09-09 | Discharge: 2024-09-09 | Disposition: A | Discharge status: Home / Self Care | Source: Ambulatory Visit | Attending: Orthopaedic Surgery | Admitting: Orthopaedic Surgery

## 2024-09-09 ENCOUNTER — Other Ambulatory Visit: Payer: Self-pay

## 2024-09-09 ENCOUNTER — Encounter (HOSPITAL_COMMUNITY): Payer: Self-pay

## 2024-09-09 ENCOUNTER — Ambulatory Visit (HOSPITAL_COMMUNITY)
Admission: RE | Admit: 2024-09-09 | Discharge: 2024-09-09 | Disposition: A | Discharge status: Home / Self Care | Source: Ambulatory Visit | Attending: Orthopaedic Surgery | Admitting: Orthopaedic Surgery

## 2024-09-09 VITALS — BP 137/83 | HR 66 | Temp 98.2°F | Resp 16 | Ht 73.0 in | Wt 186.0 lb

## 2024-09-09 DIAGNOSIS — I10 Essential (primary) hypertension: Secondary | ICD-10-CM | POA: Insufficient documentation

## 2024-09-09 DIAGNOSIS — Z01818 Encounter for other preprocedural examination: Secondary | ICD-10-CM

## 2024-09-09 HISTORY — DX: Cardiac arrhythmia, unspecified: I49.9

## 2024-09-09 LAB — BASIC METABOLIC PANEL WITH GFR
Anion gap: 13 (ref 5–15)
BUN: 20 mg/dL (ref 8–23)
CO2: 22 mmol/L (ref 22–32)
Calcium: 10 mg/dL (ref 8.9–10.3)
Chloride: 102 mmol/L (ref 98–111)
Creatinine, Ser: 0.82 mg/dL (ref 0.61–1.24)
GFR, Estimated: >60 mL/min (ref >60)
Glucose, Bld: 98 mg/dL (ref 70–99)
Potassium: 4 mmol/L (ref 3.5–5.1)
Sodium: 137 mmol/L (ref 135–145)

## 2024-09-09 LAB — SURGICAL PCR SCREEN
MRSA, PCR: NEGATIVE
Staphylococcus aureus: POSITIVE — AB

## 2024-09-09 LAB — CBC
HCT: 39.7 % (ref 39.0–52.0)
Hemoglobin: 13 g/dL (ref 13.0–17.0)
MCH: 33.9 pg (ref 26.0–34.0)
MCHC: 32.7 g/dL (ref 30.0–36.0)
MCV: 103.4 fL — ABNORMAL HIGH (ref 80.0–100.0)
Platelets: 328 K/uL (ref 150–400)
RBC: 3.84 MIL/uL — ABNORMAL LOW (ref 4.22–5.81)
RDW: 12.9 % (ref 11.5–15.5)
WBC: 9.1 K/uL (ref 4.0–10.5)
nRBC: 0 % (ref 0.0–0.2)

## 2024-09-09 NOTE — H&P (Signed)
 TOTAL HIP ADMISSION H&P  Patient is admitted for right total hip arthroplasty.  Subjective:  Chief Complaint: right hip pain  HPI: Matthew [name], 68 y.o. male, has a history of pain and functional disability in the right hip(s) due to arthritis and patient has failed non-surgical conservative treatments for greater than 12 weeks to include NSAID's and/or analgesics, corticosteriod injections, flexibility and strengthening excercises, supervised PT with diminished ADL's post treatment, use of assistive devices, weight reduction as appropriate, and activity modification.  Onset of symptoms was gradual starting 5 years ago with gradually worsening course since that time.The patient noted no past surgery on the right hip(s).  Patient currently rates pain in the right hip at 10 out of 10 with activity. Patient has night pain, worsening of pain with activity and weight bearing, trendelenberg gait, pain that interfers with activities of daily living, pain with passive range of motion, and crepitus. Patient has evidence of subchondral cysts, subchondral sclerosis, periarticular osteophytes, and joint space narrowing by imaging studies. This condition presents safety issues increasing the risk of falls.  There is no current active infection.  Patient Active Problem List   Diagnosis Date Noted   Arthritis of right hip 07/03/2024   Infection of finger 07/03/2024   Acute bursitis of right shoulder 02/28/2024   Trigger finger, right ring finger 02/28/2024   Strain musc/tend the rotator cuff of unsp shoulder, sequela 01/19/2024   Left carpal tunnel syndrome 12/27/2022   Fungal nail infection 10/26/2022   Food poisoning 09/14/2022   AC (acromioclavicular) arthritis 02/03/2022   Degenerative joint disease of knee, right 01/07/2022   Numbness and tingling of left thumb 09/07/2021   Primary localized osteoarthritis of left hip 07/20/2021   Prediabetes 07/07/2021   Pre-op evaluation 07/05/2021   Severe  left groin pain 05/18/2021   Lateral epicondylitis, left elbow 04/15/2021   Left shoulder pain 02/24/2021   Pharyngeal dysphagia, mild 01/17/2021   Left-sided nosebleed 12/21/2020   Acquired trigger finger of right middle finger 11/12/2020   Sinusitis, chronic 08/18/2020   History of atrial fibrillation 05/25/2020   SVT (supraventricular tachycardia) 03/09/2020   Subluxation of distal radial-ulnar joint 12/03/2019   Golfer's elbow, right 09/25/2019   Former smoker 03/07/2019   Right groin pain 11/08/2018   Chronic knee pain after total replacement of left knee joint 06/04/2018   Subungual hematoma of foot, initial encounter 05/29/2018   Pain and swelling of left knee 05/29/2018   Heart beat abnormality 12/14/2017   [name] 11/15/2017   Primary osteoarthritis of left knee 11/06/2017   Lipoma of torso 11/02/2017   Ganglion 11/02/2017   Sleep deficient 03/22/2017   Bursitis of left foot 11/11/2016   Subluxation of tendon of long head of biceps 10/15/2016   Leg cramps, sleep related 10/15/2016   Hamstring tendinitis of left thigh 07/26/2016   Gout 07/26/2016   Osteitis pubis 01/27/2016   Neck tightness 08/12/2015   Nonallopathic lesion of cervical region 08/12/2015   Nonallopathic lesion of thoracic region 08/12/2015   Nonallopathic lesion-rib cage 08/12/2015   ACE-inhibitor cough 08/12/2015   GERD (gastroesophageal reflux disease) 08/01/2014   Neck pain 03/03/2014   Hypertension 02/14/2014   Allergic rhinitis 02/14/2014   Herpes genitalis in men 02/14/2014   History of colonic polyps 05/06/2011   Pes planus 04/30/2008   Past Medical History:  Diagnosis Date   Allergy    RHINITIS   Anemia    Arthritis    Bursitis of left foot 11/11/2016   Colonic  polyp 09/2006   colonoscopy   Dysrhythmia    Ganglion 11/02/2017   GERD (gastroesophageal reflux disease)    Golfer's elbow, right 09/25/2019   Gout    Heart beat abnormality 12/14/2017   Heart murmur     Herpes genitalis in men 02/14/2014   Hypertension    Osteitis pubis 01/27/2016   Primary osteoarthritis of left knee 11/06/2017   Right groin pain 11/08/2018   SOB (shortness of breath)    per pt, may be coming from new medication   Subluxation of distal radial-ulnar joint 12/03/2019   Subluxation of tendon of long head of biceps 10/15/2016   Injected shoulder 12/19/2016   Subungual hematoma of foot, initial encounter 05/29/2018   SVT (supraventricular tachycardia) 03/09/2020    Past Surgical History:  Procedure Laterality Date   COLONOSCOPY  09/2006   KNEE ARTHROSCOPY Left 2011   KNEE SURGERY Left 08/2018   scar tissue surgery   LUMBAR DISC SURGERY  1990   SVT ABLATION N/A 01/09/2020   Procedure: SVT ABLATION;  Surgeon: Inocencio Soyla Lunger, MD;  Location: [facility];  Service: Cardiovascular;  Laterality: N/A;   TOTAL HIP ARTHROPLASTY Left 07/20/2021   Procedure: LEFT TOTAL HIP ARTHROPLASTY ANTERIOR APPROACH;  Surgeon: Sheril Coy, MD;  Location: [facility];  Service: Orthopedics;  Laterality: Left;   TOTAL KNEE ARTHROPLASTY Left 11/06/2017   Procedure: TOTAL KNEE ARTHROPLASTY;  Surgeon: Yvone Rush, MD;  Location: [facility];  Service: Orthopedics;  Laterality: Left;    No current facility-administered medications for this encounter.   Current Outpatient Medications  Medication Sig Dispense Refill Last Dose/Taking   allopurinol  (ZYLOPRIM ) 300 MG tablet TAKE 1 TABLET BY MOUTH EVERY DAY 90 tablet 1 Taking   cholecalciferol (VITAMIN D ) 1000 units tablet Take 1,000 Units by mouth daily.   Taking   diltiazem  (CARDIZEM  CD) 360 MG 24 hr capsule TAKE 1 CAPSULE BY MOUTH EVERY DAY 30 capsule 0 Taking   esomeprazole  (NEXIUM ) 40 MG capsule TAKE 1 CAPSULE (40 MG TOTAL) BY MOUTH 2 (TWO) TIMES DAILY BEFORE A MEAL. TAKE ONE TABLET 30-60 MINUTES BEFORE BREAKFAST (Patient taking differently: Take 80 mg by mouth 2 (two) times daily before a meal.) 60 capsule 3 Taking Differently   ferrous sulfate   325 (65 FE) MG tablet Take 325 mg by mouth every other day.   Taking   fexofenadine (ALLEGRA) 180 MG tablet Take 180 mg by mouth at bedtime.    Taking   flecainide (TAMBOCOR) 50 MG tablet Take 50 mg by mouth 2 (two) times daily.   Taking   fluticasone (FLONASE) 50 MCG/ACT nasal spray Place 2 sprays into both nostrils daily.   Taking   hydrALAZINE (APRESOLINE) 10 MG tablet Take 10 mg by mouth in the morning and at bedtime.   Taking   hydrochlorothiazide  (HYDRODIURIL ) 25 MG tablet Take 25 mg by mouth daily.   Taking   losartan  (COZAAR ) 100 MG tablet Take 100 mg by mouth daily.   Taking   magnesium  gluconate (MAGONATE) 500 (27 Mg) MG TABS tablet Take 500 mg by mouth at bedtime.   Taking   meloxicam  (MOBIC ) 15 MG tablet TAKE 1 TABLET BY MOUTH DAILY (Patient taking differently: Take 15 mg by mouth See admin instructions. Take 15 mg by mouth 5 day a week) 30 tablet 2 Taking Differently   montelukast  (SINGULAIR ) 10 MG tablet TAKE 1 TABLET BY MOUTH EVERY NIGHT AT BEDTIME 90 tablet 1 Taking   Multiple Vitamin (MULTIVITAMIN) tablet Take 1 tablet by  mouth daily.   Taking   Omega-3 Fatty Acids (FISH OIL PO) Take 2 capsules by mouth daily.   Taking   predniSONE  (DELTASONE ) 20 MG tablet Take 2 tablets (40 mg total) by mouth daily with breakfast. (Patient not taking: Reported on 09/09/2024) 10 tablet 0 Not Taking   rosuvastatin  (CRESTOR ) 20 MG tablet TAKE 1 TABLET BY MOUTH EVERY DAY 30 tablet 11 Taking   TART CHERRY PO Take 3 tablets by mouth daily.   Taking   traZODone  (DESYREL ) 50 MG tablet TAKE ONE HALF TO ONE TABLET BY MOUTH EVERY NIGHT AT BEDTIME AS NEEDED FOR SLEEP (Patient taking differently: Take 25 mg by mouth at bedtime.) 30 tablet 0 Taking Differently   triamcinolone  (KENALOG ) 0.1 % paste Use as directed 1 Application in the mouth or throat daily as needed (mouth sores).   Taking As Needed   valACYclovir  (VALTREX ) 500 MG tablet Take 500 mg by mouth daily.   Taking   doxycycline  (VIBRAMYCIN ) 100 MG  capsule Take 1 capsule (100 mg total) by mouth 2 (two) times daily. (Patient not taking: Reported on 09/09/2024) 20 capsule 0 Not Taking   HYDROcodone -acetaminophen  (NORCO/VICODIN) 5-325 MG tablet 1 tablets, every 8 hours, by mouth,  as needed for pain, for 5 days. (Patient not taking: Reported on 09/09/2024)   Not Taking   nitroGLYCERIN  (NITRO-DUR ) 0.2 mg/hr patch Apply 1/4 of a patch to skin once daily. (Patient not taking: Reported on 09/09/2024) 30 patch 0 Not Taking   Allergies  Allergen Reactions   Beta Adrenergic Blockers Other (See Comments)    Fatigue   Lisinopril  Cough   Asa [Aspirin ] Rash    High dosage ASA causes break out   Demerol  Itching    Social History   Tobacco Use   Smoking status: Some Days    Types: Cigars   Smokeless tobacco: Never   Tobacco comments:    occasional cigar  Substance Use Topics   Alcohol use: Yes    Alcohol/week: 1.0 standard drink of alcohol    Types: 1 Glasses of wine per week    Comment: daily    Family History  Problem Relation Age of Onset   Stroke Father    Gout Father    Depression Brother    Dermatomyositis Daughter    Colon cancer Neg Hx    Rectal cancer Neg Hx    Esophageal cancer Neg Hx    Pancreatic cancer Neg Hx    Stomach cancer Neg Hx    Liver disease Neg Hx      Review of Systems  Musculoskeletal:  Positive for arthralgias.       Right hip  All other systems reviewed and are negative.   Objective:  Physical Exam Constitutional:      Appearance: Normal appearance.  HENT:     Head: Normocephalic and atraumatic.     Nose: Nose normal.     Mouth/Throat:     Pharynx: Oropharynx is clear.  Eyes:     Extraocular Movements: Extraocular movements intact.  Pulmonary:     Effort: Pulmonary effort is normal.  Abdominal:     Palpations: Abdomen is soft.  Musculoskeletal:     Cervical back: Normal range of motion.     Comments: Examination of the right hip shows decreased range of motion with pain at internal  rotation.  He walks with a slightly altered gait.  Left hip has full range of motion.    Skin:    General: Skin is warm  and dry.  Neurological:     General: No focal deficit present.     Mental Status: He is alert and oriented to person, place, and time.  Psychiatric:        Mood and Affect: Mood normal.        Behavior: Behavior normal.        Thought Content: Thought content normal.        Judgment: Judgment normal.     Vital signs in last 24 hours: Temp:  [98.2 F (36.8 C)] 98.2 F (36.8 C) (11/10 1114) Pulse Rate:  [66] 66 (11/10 1114) Resp:  [16] 16 (11/10 1114) BP: (137)/(83) 137/83 (11/10 1114) SpO2:  [95 %] 95 % (11/10 1114) Weight:  [84.4 kg] 84.4 kg (11/10 1114)  Labs:   Estimated body mass index is 24.54 kg/m as calculated from the following:   Height as of 09/09/24: 6' 1 (1.854 m).   Weight as of 09/09/24: 84.4 kg.   Imaging Review Plain radiographs demonstrate severe degenerative joint disease of the right hip(s). The bone quality appears to be good for age and reported activity level.    Assessment/Plan:  End stage primary arthritis, right hip(s)  The patient history, physical examination, clinical judgement of the provider and imaging studies are consistent with end stage degenerative joint disease of the right hip(s) and total hip arthroplasty is deemed medically necessary. The treatment options including medical management, injection therapy, arthroscopy and arthroplasty were discussed at length. The risks and benefits of total hip arthroplasty were presented and reviewed. The risks due to aseptic loosening, infection, stiffness, dislocation/subluxation,  thromboembolic complications and other imponderables were discussed.  The patient acknowledged the explanation, agreed to proceed with the plan and consent was signed. Patient is being admitted for inpatient treatment for surgery, pain control, PT, OT, prophylactic antibiotics, VTE prophylaxis, progressive  ambulation and ADL's and discharge planning.The patient is planning to be discharged home with home health services

## 2024-09-10 ENCOUNTER — Encounter (HOSPITAL_COMMUNITY): Payer: Self-pay

## 2024-09-10 NOTE — Progress Notes (Signed)
 Request sent to Dr. MYRTIS Herald to view pt's pre op PCR result from 09/09/24.

## 2024-09-10 NOTE — Progress Notes (Signed)
 Case: [id number] Date/Time: 09/17/24 0715   Procedure: ARTHROPLASTY, HIP, TOTAL, ANTERIOR APPROACH (Right: Hip)   Anesthesia type: Spinal   Pre-op diagnosis: RIGHT HIP DEGENERATIVE JOINT DISEASE   [facility]   Surgeons: Sheril Coy, MD       DISCUSSION: Matthew [name] is a 68 yo male with PMH of current smoking, HTN, CAD (by CT), SVT, NSVT, A.fib, GERD.  Patient follows with Cardiology at [facility]. He had attempted ablation for SVT at [facility] however per Dr. Inocencio: He had an attempted ablation 01/09/2020, unfortunately though went into atrial fibrillation.  No pacing could be performed due to recurrences of atrial fibrillation.  He is currently on diltiazem .  Additionally Holter monitor in 01/2022 showed 2 episodes of VT with the longest episodes being 8 beats and 10 episodes of SVT with the longest episode being 16 beats. Patient last seen on 03/26/24 and he reported occasional palpitations when going upstairs. Event monitor was discussed however patient declined due to lack of significant symptoms. He was advised continue medications (Flecainide and metoprolol ) and f/u in 6 months.  Seen by PCP on 04/05/24. Stable at that visit.  VS: BP 137/83   Pulse 66   Temp 36.8 C (Oral)   Resp 16   Ht 6' 1 (1.854 m)   Wt 84.4 kg   SpO2 95%   BMI 24.54 kg/m   PROVIDERS: Geofm Glade PARAS, MD   LABS: Labs reviewed: Acceptable for surgery. (all labs ordered are listed, but only abnormal results are displayed)  Labs Reviewed  SURGICAL PCR SCREEN - Abnormal; Notable for the following components:      Result Value   Staphylococcus aureus POSITIVE (*)    All other components within normal limits  CBC - Abnormal; Notable for the following components:   RBC 3.84 (*)    MCV 103.4 (*)    All other components within normal limits  BASIC METABOLIC PANEL WITH GFR  TYPE AND SCREEN    CXR 09/09/24:  FINDINGS: The cardiomediastinal contours are stable. The heart is normal  in size. The lungs are clear. Pulmonary vasculature is normal. No consolidation, pleural effusion, or pneumothorax. No acute osseous abnormalities are seen. Pectus excavatum.   IMPRESSION: No active cardiopulmonary disease.  EKG 09/09/24:  Normal sinus rhythm Left axis deviation Pulmonary disease pattern Incomplete right bundle branch block Abnormal ECG When compared with ECG of 09-Jan-2020 09:16, No significant change since last tracing   Echocardiography (03/30/2022) - [facility]  EF >55%   NORMAL LEFT VENTRICULAR SYSTOLIC FUNCTION WITH MILD LVH   NORMAL LA PRESSURES WITH NORMAL DIASTOLIC FUNCTION   NORMAL RIGHT VENTRICULAR SYSTOLIC FUNCTION   VALVULAR REGURGITATION: TRIVIAL PR, TRIVIAL TR   NO VALVULAR STENOSIS     Compared with prior Echo study on 07/27/2020: NO SIGNIFICANT CHANGE  Stress test 03/25/2022 [facility]:    Summary: Resting ECG: normal sinus rhythm.  HR Response to Exercise: appropriate.  BP Response to Exercise: normal resting BP - appropriate response.  Chest Pain: none.  Arrhythmias: ventricular couplets, isolated PVCs.    ST Changes: Depression upsloping.  Overall Impression: negative, adequate.    cMRI (11/19/2020) - [facility]iac MRI: 1. The left ventricle is normal in cavity size and wall thickness. Regional and global systolic function are normal. The LV ejection fraction is >70%.  2. The right ventricle is normal in cavity size, wall thickness, and systolic function. There are no RV aneurysms or wall motion abnormalities.  The RVEF is calculated at 61%.  3. Both atria are normal in size. 4. The aortic valve is trileaflet in morphology. There is no significant aortic valve stenosis or regurgitation.  5.  There is mild tricuspid regurgitation. There is trivial pulmonic regurgitation.  There is trivial mitral regurgitation. 6. Delayed enhancement imaging demonstrates no evidence of myocardial infarction, scarring or infiltration. 7.  There is no evidence  of an intracardiac thrombus. 8.  The pericardium is normal in thickness.  There is trivial pericardial effusion.  CT coronary 07/02/2019:  IMPRESSION: 1. Coronary calcium  score of 43. This was 3 percentile for age and sex matched control.   2. Normal coronary origin with right dominance.   3. Mild calcified plaque in proximal LAD; minimal calcified plaque proximal RCA; CAD-RADS 1.   4. Mildly dilated pulmonary artery. Past Medical History:  Diagnosis Date   Allergy    RHINITIS   Anemia    Arthritis    Bursitis of left foot 11/11/2016   Colonic polyp 09/2006   colonoscopy   Dysrhythmia    Ganglion 11/02/2017   GERD (gastroesophageal reflux disease)    Golfer's elbow, right 09/25/2019   Gout    Heart beat abnormality 12/14/2017   Heart murmur    Herpes genitalis in men 02/14/2014   Hypertension    Osteitis pubis 01/27/2016   Primary osteoarthritis of left knee 11/06/2017   Right groin pain 11/08/2018   SOB (shortness of breath)    per pt, may be coming from new medication   Subluxation of distal radial-ulnar joint 12/03/2019   Subluxation of tendon of long head of biceps 10/15/2016   Injected shoulder 12/19/2016   Subungual hematoma of foot, initial encounter 05/29/2018   SVT (supraventricular tachycardia) 03/09/2020    Past Surgical History:  Procedure Laterality Date   COLONOSCOPY  09/2006   KNEE ARTHROSCOPY Left 2011   KNEE SURGERY Left 08/2018   scar tissue surgery   LUMBAR DISC SURGERY  1990   SVT ABLATION N/A 01/09/2020   Procedure: SVT ABLATION;  Surgeon: Inocencio Soyla Lunger, MD;  [facility];  Service: Cardiovascular;  Laterality: N/A;   TOTAL HIP ARTHROPLASTY Left 07/20/2021   Procedure: LEFT TOTAL HIP ARTHROPLASTY ANTERIOR APPROACH;  Surgeon: Sheril Coy, MD;  Location: [facility];  Service: Orthopedics;  Laterality: Left;   TOTAL KNEE ARTHROPLASTY Left 11/06/2017   Procedure: TOTAL KNEE ARTHROPLASTY;  Surgeon: Yvone Rush, MD;  Location:  [facility];  Service: Orthopedics;  Laterality: Left;    MEDICATIONS:  predniSONE  (DELTASONE ) 20 MG tablet   allopurinol  (ZYLOPRIM ) 300 MG tablet   cholecalciferol (VITAMIN D ) 1000 units tablet   diltiazem  (CARDIZEM  CD) 360 MG 24 hr capsule   doxycycline  (VIBRAMYCIN ) 100 MG capsule   esomeprazole  (NEXIUM ) 40 MG capsule   ferrous sulfate  325 (65 FE) MG tablet   fexofenadine (ALLEGRA) 180 MG tablet   flecainide (TAMBOCOR) 50 MG tablet   fluticasone (FLONASE) 50 MCG/ACT nasal spray   hydrALAZINE (APRESOLINE) 10 MG tablet   hydrochlorothiazide  (HYDRODIURIL ) 25 MG tablet   HYDROcodone -acetaminophen  (NORCO/VICODIN) 5-325 MG tablet   losartan  (COZAAR ) 100 MG tablet   magnesium  gluconate (MAGONATE) 500 (27 Mg) MG TABS tablet   meloxicam  (MOBIC ) 15 MG tablet   montelukast  (SINGULAIR ) 10 MG tablet   Multiple Vitamin (MULTIVITAMIN) tablet   nitroGLYCERIN  (NITRO-DUR ) 0.2 mg/hr patch   Omega-3 Fatty Acids (FISH OIL PO)   rosuvastatin  (CRESTOR ) 20 MG tablet   TART CHERRY PO   traZODone  (DESYREL ) 50 MG tablet   triamcinolone  (KENALOG ) 0.1 %  paste   valACYclovir  (VALTREX ) 500 MG tablet   No current facility-administered medications for this encounter.   Burnard CHRISTELLA Odis DEVONNA [facility] Short Stay/Anesthesiology [facility] Phone [phone] 09/10/2024 3:00 PM

## 2024-09-10 NOTE — Anesthesia Preprocedure Evaluation (Addendum)
 Anesthesia Evaluation  Patient identified by MRN, date of birth, ID band Patient awake    Reviewed: Allergy & Precautions, NPO status , Patient's Chart, lab work & pertinent test results  History of Anesthesia Complications Negative for: history of anesthetic complications  Airway Mallampati: II  TM Distance: >3 FB Neck ROM: Full    Dental no notable dental hx.    Pulmonary Current Smoker and Patient abstained from smoking.   Pulmonary exam normal        Cardiovascular hypertension, Pt. on medications Normal cardiovascular exam+ dysrhythmias Supra Ventricular Tachycardia      Neuro/Psych negative neurological ROS     GI/Hepatic Neg liver ROS,GERD  Medicated,,  Endo/Other  negative endocrine ROS    Renal/GU negative Renal ROS     Musculoskeletal  (+) Arthritis ,    Abdominal   Peds  Hematology negative hematology ROS (+)   Anesthesia Other Findings   Reproductive/Obstetrics                              Anesthesia Physical Anesthesia Plan  ASA: 2  Anesthesia Plan: Spinal   Post-op Pain Management: Tylenol  PO (pre-op)*   Induction:   PONV Risk Score and Plan: 2 and Treatment may vary due to age or medical condition, Ondansetron , Propofol  infusion, Dexamethasone  and Midazolam   Airway Management Planned: Natural Airway and Simple Face Mask  Additional Equipment: None  Intra-op Plan:   Post-operative Plan:   Informed Consent: I have reviewed the patients History and Physical, chart, labs and discussed the procedure including the risks, benefits and alternatives for the proposed anesthesia with the patient or authorized representative who has indicated his/her understanding and acceptance.       Plan Discussed with: CRNA  Anesthesia Plan Comments: (See PAT note from 11/10)         Anesthesia Quick Evaluation

## 2024-09-16 MED ORDER — TRANEXAMIC ACID 1000 MG/10ML IV SOLN
2000.0000 mg | INTRAVENOUS | Status: DC
  Filled 2024-09-16: qty 20

## 2024-09-17 ENCOUNTER — Ambulatory Visit (HOSPITAL_COMMUNITY): Payer: Self-pay | Admitting: Physician Assistant

## 2024-09-17 ENCOUNTER — Ambulatory Visit (HOSPITAL_COMMUNITY)
Admission: RE | Admit: 2024-09-17 | Discharge: 2024-09-17 | Disposition: A | Discharge status: Home / Self Care | Source: Ambulatory Visit | Attending: Orthopaedic Surgery | Admitting: Orthopaedic Surgery

## 2024-09-17 ENCOUNTER — Encounter (HOSPITAL_COMMUNITY)
Admission: RE | Disposition: A | Discharge status: Home / Self Care | Payer: Self-pay | Source: Ambulatory Visit | Attending: Orthopaedic Surgery

## 2024-09-17 ENCOUNTER — Ambulatory Visit (HOSPITAL_BASED_OUTPATIENT_CLINIC_OR_DEPARTMENT_OTHER): Payer: Self-pay | Admitting: Anesthesiology

## 2024-09-17 ENCOUNTER — Ambulatory Visit (HOSPITAL_COMMUNITY)

## 2024-09-17 ENCOUNTER — Encounter (HOSPITAL_COMMUNITY): Payer: Self-pay | Admitting: Orthopaedic Surgery

## 2024-09-17 DIAGNOSIS — I10 Essential (primary) hypertension: Secondary | ICD-10-CM | POA: Insufficient documentation

## 2024-09-17 DIAGNOSIS — M1611 Unilateral primary osteoarthritis, right hip: Secondary | ICD-10-CM | POA: Diagnosis present

## 2024-09-17 DIAGNOSIS — Z79899 Other long term (current) drug therapy: Secondary | ICD-10-CM | POA: Diagnosis not present

## 2024-09-17 DIAGNOSIS — K219 Gastro-esophageal reflux disease without esophagitis: Secondary | ICD-10-CM | POA: Insufficient documentation

## 2024-09-17 DIAGNOSIS — F17290 Nicotine dependence, other tobacco product, uncomplicated: Secondary | ICD-10-CM | POA: Insufficient documentation

## 2024-09-17 LAB — TYPE AND SCREEN
ABO/RH(D): O POS
Antibody Screen: NEGATIVE

## 2024-09-17 SURGERY — ARTHROPLASTY, HIP, TOTAL, ANTERIOR APPROACH
Anesthesia: Spinal | Site: Hip | Laterality: Right

## 2024-09-17 MED ORDER — TIZANIDINE HCL 4 MG PO TABS: 4.0000 mg | ORAL_TABLET | Freq: Four times a day (QID) | ORAL | 0 refills | Status: AC | PRN

## 2024-09-17 MED ORDER — BUPIVACAINE LIPOSOME 1.3 % IJ SUSP: 10.0000 mL | Freq: Once | INTRAMUSCULAR | Status: DC

## 2024-09-17 MED ORDER — CEFAZOLIN SODIUM-DEXTROSE 2-4 GM/100ML-% IV SOLN
2.0000 g | INTRAVENOUS | Status: AC
  Administered 2024-09-17: 2 g via INTRAVENOUS
  Filled 2024-09-17: qty 100

## 2024-09-17 MED ORDER — DROPERIDOL 2.5 MG/ML IJ SOLN: 0.6250 mg | Freq: Once | INTRAMUSCULAR | Status: DC | PRN

## 2024-09-17 MED ORDER — FENTANYL CITRATE (PF) 100 MCG/2ML IJ SOLN
INTRAMUSCULAR | Status: AC
  Filled 2024-09-17: qty 2

## 2024-09-17 MED ORDER — PROPOFOL 10 MG/ML IV BOLUS
INTRAVENOUS | Status: DC | PRN
  Administered 2024-09-17: 140 ug/kg/min via INTRAVENOUS
  Administered 2024-09-17: 50 mg via INTRAVENOUS

## 2024-09-17 MED ORDER — LIDOCAINE HCL (PF) 2 % IJ SOLN
INTRAMUSCULAR | Status: AC
  Filled 2024-09-17: qty 5

## 2024-09-17 MED ORDER — ASPIRIN 81 MG PO TBEC: 81.0000 mg | DELAYED_RELEASE_TABLET | Freq: Two times a day (BID) | ORAL | 0 refills | Status: AC

## 2024-09-17 MED ORDER — METHOCARBAMOL 500 MG PO TABS
500.0000 mg | ORAL_TABLET | Freq: Four times a day (QID) | ORAL | Status: DC | PRN
  Administered 2024-09-17: 500 mg via ORAL

## 2024-09-17 MED ORDER — METHOCARBAMOL 1000 MG/10ML IJ SOLN
500.0000 mg | Freq: Four times a day (QID) | INTRAMUSCULAR | Status: DC | PRN
Start: 2024-09-17 — End: 2024-09-17

## 2024-09-17 MED ORDER — TRANEXAMIC ACID-NACL 1000-0.7 MG/100ML-% IV SOLN
INTRAVENOUS | Status: DC | PRN
  Administered 2024-09-17: 1000 mg via INTRAVENOUS

## 2024-09-17 MED ORDER — LACTATED RINGERS IV SOLN: INTRAVENOUS | Status: DC

## 2024-09-17 MED ORDER — MIDAZOLAM HCL 2 MG/2ML IJ SOLN
INTRAMUSCULAR | Status: AC
  Filled 2024-09-17: qty 2

## 2024-09-17 MED ORDER — PROPOFOL 1000 MG/100ML IV EMUL
INTRAVENOUS | Status: AC
  Filled 2024-09-17: qty 100

## 2024-09-17 MED ORDER — LACTATED RINGERS IV BOLUS
250.0000 mL | Freq: Once | INTRAVENOUS | Status: AC
  Administered 2024-09-17: 250 mL via INTRAVENOUS

## 2024-09-17 MED ORDER — BUPIVACAINE IN DEXTROSE 0.75-8.25 % IT SOLN
INTRATHECAL | Status: DC | PRN
  Administered 2024-09-17: 1.6 mL via INTRATHECAL

## 2024-09-17 MED ORDER — VASOPRESSIN 20 UNIT/ML IV SOLN
INTRAVENOUS | Status: AC
  Filled 2024-09-17: qty 1

## 2024-09-17 MED ORDER — ONDANSETRON HCL 4 MG/2ML IJ SOLN
INTRAMUSCULAR | Status: AC
  Filled 2024-09-17: qty 2

## 2024-09-17 MED ORDER — BUPIVACAINE-EPINEPHRINE (PF) 0.25% -1:200000 IJ SOLN
INTRAMUSCULAR | Status: DC | PRN
  Administered 2024-09-17: 40 mL

## 2024-09-17 MED ORDER — BUPIVACAINE LIPOSOME 1.3 % IJ SUSP
INTRAMUSCULAR | Status: AC
  Filled 2024-09-17: qty 10

## 2024-09-17 MED ORDER — PHENYLEPHRINE 80 MCG/ML (10ML) SYRINGE FOR IV PUSH (FOR BLOOD PRESSURE SUPPORT)
PREFILLED_SYRINGE | INTRAVENOUS | Status: DC | PRN
  Administered 2024-09-17: 80 ug via INTRAVENOUS

## 2024-09-17 MED ORDER — TRANEXAMIC ACID 1000 MG/10ML IV SOLN
INTRAVENOUS | Status: DC | PRN
  Administered 2024-09-17: 2000 mg via TOPICAL

## 2024-09-17 MED ORDER — HYDROCODONE-ACETAMINOPHEN 7.5-325 MG PO TABS
ORAL_TABLET | ORAL | Status: AC
  Filled 2024-09-17: qty 1

## 2024-09-17 MED ORDER — HYDROCODONE-ACETAMINOPHEN 5-325 MG PO TABS: 1.0000 | ORAL_TABLET | Freq: Four times a day (QID) | ORAL | 0 refills | Status: AC | PRN

## 2024-09-17 MED ORDER — METHOCARBAMOL 500 MG PO TABS
ORAL_TABLET | ORAL | Status: AC
  Filled 2024-09-17: qty 1

## 2024-09-17 MED ORDER — CEFAZOLIN SODIUM-DEXTROSE 2-4 GM/100ML-% IV SOLN: 2.0000 g | Freq: Four times a day (QID) | INTRAVENOUS | Status: DC

## 2024-09-17 MED ORDER — OXYCODONE HCL 5 MG PO TABS: 5.0000 mg | ORAL_TABLET | Freq: Once | ORAL | Status: DC | PRN

## 2024-09-17 MED ORDER — HYDROCODONE-ACETAMINOPHEN 7.5-325 MG PO TABS
1.0000 | ORAL_TABLET | ORAL | Status: DC | PRN
  Administered 2024-09-17: 1 via ORAL

## 2024-09-17 MED ORDER — FENTANYL CITRATE (PF) 50 MCG/ML IJ SOSY: 25.0000 ug | PREFILLED_SYRINGE | INTRAMUSCULAR | Status: DC | PRN

## 2024-09-17 MED ORDER — BUPIVACAINE-EPINEPHRINE (PF) 0.25% -1:200000 IJ SOLN
INTRAMUSCULAR | Status: AC
  Filled 2024-09-17: qty 30

## 2024-09-17 MED ORDER — ORAL CARE MOUTH RINSE: 15.0000 mL | Freq: Once | OROMUCOSAL | Status: AC

## 2024-09-17 MED ORDER — LACTATED RINGERS IV SOLN: INTRAVENOUS | Status: DC | PRN

## 2024-09-17 MED ORDER — PHENYLEPHRINE HCL (PRESSORS) 10 MG/ML IV SOLN
INTRAVENOUS | Status: AC
  Filled 2024-09-17: qty 1

## 2024-09-17 MED ORDER — 0.9 % SODIUM CHLORIDE (POUR BTL) OPTIME
TOPICAL | Status: DC | PRN
  Administered 2024-09-17: 1000 mL

## 2024-09-17 MED ORDER — OXYCODONE HCL 5 MG/5ML PO SOLN: 5.0000 mg | Freq: Once | ORAL | Status: DC | PRN

## 2024-09-17 MED ORDER — PHENYLEPHRINE 80 MCG/ML (10ML) SYRINGE FOR IV PUSH (FOR BLOOD PRESSURE SUPPORT)
PREFILLED_SYRINGE | INTRAVENOUS | Status: AC
  Filled 2024-09-17: qty 10

## 2024-09-17 MED ORDER — TRANEXAMIC ACID-NACL 1000-0.7 MG/100ML-% IV SOLN
INTRAVENOUS | Status: AC
  Filled 2024-09-17: qty 100

## 2024-09-17 MED ORDER — FENTANYL CITRATE (PF) 100 MCG/2ML IJ SOLN
INTRAMUSCULAR | Status: DC | PRN
  Administered 2024-09-17: 50 ug via INTRAVENOUS

## 2024-09-17 MED ORDER — MIDAZOLAM HCL (PF) 2 MG/2ML IJ SOLN
INTRAMUSCULAR | Status: DC | PRN
  Administered 2024-09-17: 2 mg via INTRAVENOUS

## 2024-09-17 MED ORDER — ONDANSETRON HCL 4 MG/2ML IJ SOLN
INTRAMUSCULAR | Status: DC | PRN
  Administered 2024-09-17: 4 mg via INTRAVENOUS

## 2024-09-17 MED ORDER — CHLORHEXIDINE GLUCONATE 4 % EX SOLN: 1.0000 | CUTANEOUS | 1 refills | Status: AC

## 2024-09-17 MED ORDER — LACTATED RINGERS IV BOLUS
500.0000 mL | Freq: Once | INTRAVENOUS | Status: AC
  Administered 2024-09-17: 500 mL via INTRAVENOUS

## 2024-09-17 MED ORDER — DEXAMETHASONE SOD PHOSPHATE PF 10 MG/ML IJ SOLN
INTRAMUSCULAR | Status: DC | PRN
  Administered 2024-09-17: 10 mg via INTRAVENOUS

## 2024-09-17 MED ORDER — POVIDONE-IODINE 10 % EX SWAB: 2.0000 | Freq: Once | CUTANEOUS | Status: DC

## 2024-09-17 MED ORDER — TRANEXAMIC ACID-NACL 1000-0.7 MG/100ML-% IV SOLN
1000.0000 mg | INTRAVENOUS | Status: DC
  Filled 2024-09-17: qty 100

## 2024-09-17 MED ORDER — SODIUM CHLORIDE 0.9 % IV SOLN: INTRAVENOUS | Status: DC | PRN

## 2024-09-17 MED ORDER — TRANEXAMIC ACID-NACL 1000-0.7 MG/100ML-% IV SOLN
1000.0000 mg | Freq: Once | INTRAVENOUS | Status: AC
  Administered 2024-09-17: 1000 mg via INTRAVENOUS

## 2024-09-17 MED ORDER — CHLORHEXIDINE GLUCONATE 0.12 % MT SOLN
15.0000 mL | Freq: Once | OROMUCOSAL | Status: AC
  Administered 2024-09-17: 15 mL via OROMUCOSAL

## 2024-09-17 MED ORDER — PHENYLEPHRINE HCL-NACL 20-0.9 MG/250ML-% IV SOLN
INTRAVENOUS | Status: DC | PRN
  Administered 2024-09-17: 50 ug/min via INTRAVENOUS

## 2024-09-17 MED ORDER — MUPIROCIN 2 % EX OINT: 1.0000 | TOPICAL_OINTMENT | Freq: Two times a day (BID) | CUTANEOUS | 0 refills | Status: AC

## 2024-09-17 MED ORDER — ACETAMINOPHEN 500 MG PO TABS
1000.0000 mg | ORAL_TABLET | Freq: Once | ORAL | Status: AC
  Administered 2024-09-17: 1000 mg via ORAL
  Filled 2024-09-17: qty 2

## 2024-09-17 SURGICAL SUPPLY — 43 items
BAG COUNTER SPONGE SURGICOUNT (BAG) IMPLANT
BAG DECANTER FOR FLEXI CONT (MISCELLANEOUS) ×2 IMPLANT
BLADE SAW SGTL 18X1.27X75 (BLADE) ×2 IMPLANT
BOOTIES KNEE HIGH SLOAN (MISCELLANEOUS) ×2 IMPLANT
COVER PERINEAL POST (MISCELLANEOUS) ×2 IMPLANT
COVER SURGICAL LIGHT HANDLE (MISCELLANEOUS) ×2 IMPLANT
CUP PINN GRIPTON 58 100 (Cup) IMPLANT
DRAPE FOOT SWITCH (DRAPES) ×2 IMPLANT
DRAPE IMP U-DRAPE 54X76 (DRAPES) ×2 IMPLANT
DRAPE STERI IOBAN 125X83 (DRAPES) ×2 IMPLANT
DRAPE U-SHAPE 47X51 STRL (DRAPES) ×2 IMPLANT
DRSG AQUACEL AG ADV 3.5X 6 (GAUZE/BANDAGES/DRESSINGS) ×2 IMPLANT
DURAPREP 26ML APPLICATOR (WOUND CARE) ×2 IMPLANT
ELECT PENCIL ROCKER SW 15FT (MISCELLANEOUS) ×2 IMPLANT
ELECT REM PT RETURN 15FT ADLT (MISCELLANEOUS) ×2 IMPLANT
ELIMINATOR HOLE APEX DEPUY (Hips) IMPLANT
GLOVE BIO SURGEON STRL SZ8 (GLOVE) ×4 IMPLANT
GLOVE BIOGEL PI IND STRL 7.0 (GLOVE) ×2 IMPLANT
GLOVE BIOGEL PI IND STRL 8 (GLOVE) ×4 IMPLANT
GLOVE SURG SYN 7.0 PF PI (GLOVE) ×2 IMPLANT
GOWN SRG XL LVL 4 BRTHBL STRL (GOWNS) ×2 IMPLANT
GOWN STRL REUS W/ TWL XL LVL3 (GOWN DISPOSABLE) ×4 IMPLANT
HEAD CERAMIC DELTA 36 PLUS 1.5 (Hips) IMPLANT
HOLDER FOLEY CATH W/STRAP (MISCELLANEOUS) ×2 IMPLANT
KIT TURNOVER KIT A (KITS) ×2 IMPLANT
LINER NEUTRAL 36X58 PLUS4 IMPLANT
MANIFOLD NEPTUNE II (INSTRUMENTS) ×2 IMPLANT
NDL HYPO 22X1.5 SAFETY MO (MISCELLANEOUS) ×2 IMPLANT
NEEDLE HYPO 22X1.5 SAFETY MO (MISCELLANEOUS) ×1 IMPLANT
NS IRRIG 1000ML POUR BTL (IV SOLUTION) ×2 IMPLANT
PACK ANTERIOR HIP CUSTOM (KITS) ×2 IMPLANT
RETRACTOR WND ALEXIS 18 MED (MISCELLANEOUS) ×2 IMPLANT
SPIKE FLUID TRANSFER (MISCELLANEOUS) ×2 IMPLANT
STEM FEMORAL SZ5 HIGH ACTIS (Stem) IMPLANT
STRIP CLOSURE SKIN 1/2X4 (GAUZE/BANDAGES/DRESSINGS) IMPLANT
SUT ETHIBOND NAB CT1 #1 30IN (SUTURE) ×4 IMPLANT
SUT VIC AB 1 CT1 36 (SUTURE) ×2 IMPLANT
SUT VIC AB 2-0 CT1 TAPERPNT 27 (SUTURE) ×2 IMPLANT
SUT VICRYL AB 3-0 FS1 BRD 27IN (SUTURE) ×2 IMPLANT
SUTURE STRATFX 0 PDS 27 VIOLET (SUTURE) ×2 IMPLANT
TRAY CATH INTERMITTENT SS 16FR (CATHETERS) IMPLANT
TRAY FOLEY MTR SLVR 16FR STAT (SET/KITS/TRAYS/PACK) ×2 IMPLANT
YANKAUER SUCT BULB TIP NO VENT (SUCTIONS) ×2 IMPLANT

## 2024-09-17 NOTE — Interval H&P Note (Signed)
 History and Physical Interval Note:  09/17/2024 7:11 AM  Matthew [name]  has presented today for surgery, with the diagnosis of RIGHT HIP DEGENERATIVE JOINT DISEASE.  The various methods of treatment have been discussed with the patient and family. After consideration of risks, benefits and other options for treatment, the patient has consented to  Procedure(s): ARTHROPLASTY, HIP, TOTAL, ANTERIOR APPROACH (Right) as a surgical intervention.  The patient's history has been reviewed, patient examined, no change in status, stable for surgery.  I have reviewed the patient's chart and labs.  Questions were answered to the patient's satisfaction.     [name]

## 2024-09-17 NOTE — Anesthesia Procedure Notes (Signed)
 Spinal  Patient location during procedure: OR Start time: 09/17/2024 7:32 AM End time: 09/17/2024 7:35 AM Reason for block: surgical anesthesia Staffing Performed: anesthesiologist  Anesthesiologist: Paul Lamarr BRAVO, MD Performed by: Paul Lamarr BRAVO, MD Authorized by: Paul Lamarr BRAVO, MD   Preanesthetic Checklist Completed: patient identified, IV checked, risks and benefits discussed, surgical consent, monitors and equipment checked, pre-op evaluation and timeout performed Spinal Block Patient position: sitting Prep: DuraPrep and site prepped and draped Patient monitoring: continuous pulse ox, blood pressure and heart rate Approach: midline Location: L3-4 Injection technique: single-shot Needle Needle type: Pencan  Needle gauge: 24 G Needle length: 9 cm Assessment Events: CSF return Additional Notes Risks, benefits, and alternative discussed. Patient gave consent to procedure. Prepped and draped in sitting position. Patient sedated but responsive to voice. Clear CSF obtained after one needle pass. Positive terminal aspiration. No pain or paraesthesias with injection. Patient tolerated procedure well. Vital signs stable. LANEY Paul, MD

## 2024-09-17 NOTE — CV Procedure (Signed)
 PRE-OP DIAGNOSIS:  RIGHT HIP DEGENERATIVE JOINT DISEASE POST-OP DIAGNOSIS:  same PROCEDURE: RIGHT TOTAL HIP ARTHROPLASTY ANTERIOR APPROACH ANESTHESIA:  Spinal and MAC SURGEON:  Maude Herald MD ASSISTANT:  Prentice Earl PA-C   INDICATIONS FOR PROCEDURE:  The patient is a 68 y.o. male with a long history of a painful hip.  This has persisted despite multiple conservative measures.  The patient has persisted with pain and dysfunction making rest and activity difficult.  A total hip replacement is offered as surgical treatment.  Informed operative consent was obtained after discussion of possible complications including reaction to anesthesia, infection, neurovascular injury, dislocation, DVT, PE, and death.  The importance of the postoperative rehab program to optimize result was stressed with the patient.  SUMMARY OF FINDINGS AND PROCEDURE:  Under the above anesthesia through a anterior approach an the Hana table a right THR was performed.  The patient had severe degenerative change and excellent bone quality.  We used DePuy components to replace the hip and these were size 4 high offset Actis femur capped with a +1.5 36mm ceramic hip ball.  On the acetabular side we used a size 58 Gription shell with a  plus 4 neutral polyethylene liner.  We did use a hole eliminator.  Prentice Earl PA-C assisted throughout and was invaluable to the completion of the case in that he helped position and retract while I performed the procedure.  He also closed simultaneously to help minimize OR time.  I used fluoroscopy throughout the case to check position of implants and leg lengths and read all of these views myself.  DESCRIPTION OF PROCEDURE:  The patient was taken to the OR suite where the above anesthetic was applied.  The patient was then positioned on the Hana table supine.  All bony prominences were appropriately padded.  Prep and drape was then performed in normal sterile fashion.  The patient was given kefzol   preoperative antibiotic and an appropriate time out was performed.  We then took an anterior approach to the right hip.  Dissection was taken through adipose to the tensor fascia lata fascia.  This structure was incised longitudinally and we dissected in the intermuscular interval just medial to this muscle.  Cobra retractors were placed superior and inferior to the femoral neck superficial to the capsule.  A capsular incision was then made and the retractors were placed along the femoral neck.  Xray was brought in to get a good level for the femoral neck cut which was made with an oscillating saw and osteotome.  The femoral head was removed with a corkscrew.  The acetabulum was exposed and some labral tissues were excised. Reaming was taken to the inside wall of the pelvis and sequentially up to 1 mm smaller than the actual component.  A trial of components was done and then the aforementioned acetabular shell was placed in appropriate tilt and anteversion confirmed by fluoroscopy. The liner was placed along with the hole eliminator and attention was turned to the femur.  The leg was brought down and over into adduction and the elevator bar was used to raise the femur up gently in the wound.  The piriformis was released with care taken to preserve the obturator internus attachment and all of the posterior capsule. The femur was reamed and then broached to the appropriate size.  A trial reduction was done and the aforementioned head and neck assembly gave us  the best stability in extension with external rotation.  Leg lengths were felt to be  about equal by fluoroscopic exam.  The trial components were removed and the wound irrigated.  We then placed the femoral component in appropriate anteversion.  The head was applied to a dry stem neck and the hip again reduced.  It was again stable in the aforementioned position.  The would was irrigated again followed by re-approximation of anterior capsule with ethibond  suture. Tensor fascia was repaired with V-loc suture  followed by deep closure with #O and #2 undyed vicryl.  Skin was closed with subQ stitch and steristrips followed by a sterile dressing.  EBL and IOF can be obtained from anesthesia records.  DISPOSITION:  The patient was taken to PACU in stable condition to potentially go home same day depending on ability to walk and tolerate liquids.

## 2024-09-17 NOTE — Transfer of Care (Signed)
 Immediate Anesthesia Transfer of Care Note  Patient: Matthew [name] Pod  Procedure(s) Performed: ARTHROPLASTY, HIP, TOTAL, ANTERIOR APPROACH (Right: Hip)  Patient Location: PACU  Anesthesia Type:MAC and Spinal  Level of Consciousness: sedated and responds to stimulation  Airway & Oxygen Therapy: Patient Spontanous Breathing and Patient connected to face mask oxygen  Post-op Assessment: Report given to RN and Post -op Vital signs reviewed and stable  Post vital signs: Reviewed and stable  Last Vitals:  Vitals Value Taken Time  BP 105/65 09/17/24 09:07  Temp    Pulse 59 09/17/24 09:08  Resp 15 09/17/24 09:08  SpO2 99 % 09/17/24 09:08  Vitals shown include unfiled device data.  Last Pain:  Vitals:   09/17/24 0546  TempSrc: Oral  PainSc: 0-No pain         Complications: No notable events documented.

## 2024-09-17 NOTE — Anesthesia Procedure Notes (Signed)
 Procedure Name: MAC Date/Time: 09/17/2024 7:35 AM  Performed by: Obadiah Reyes BROCKS, CRNAPre-anesthesia Checklist: Patient identified, Emergency Drugs available, Suction available, Patient being monitored and Timeout performed Patient Re-evaluated:Patient Re-evaluated prior to induction Oxygen Delivery Method: Simple face mask Preoxygenation: Pre-oxygenation with 100% oxygen Airway Equipment and Method: Oral airway

## 2024-09-17 NOTE — Evaluation (Signed)
 Physical Therapy Evaluation Patient Details Name: Matthew [name] MRN: [id number] DOB: [birth year] Today's Date: 09/17/2024  History of Present Illness  68 y.o. male admitted for R THA-AA 09/17/24. PMH: L THA 2022, B TKA, GERD, gout, HTN, SVT.  Clinical Impression  Pt is progressing well with mobility. He ambulated 150' with RW, no loss of balance. Stair training completed. He demonstrates good understanding of HEP. He is ready to DC home from a PT standpoint.        If plan is discharge home, recommend the following: Help with stairs or ramp for entrance;Assist for transportation   Can travel by private vehicle        Equipment Recommendations None recommended by PT  Recommendations for Other Services       Functional Status Assessment Patient has had a recent decline in their functional status and demonstrates the ability to make significant improvements in function in a reasonable and predictable amount of time.     Precautions / Restrictions Precautions Precautions: Fall Recall of Precautions/Restrictions: Intact Restrictions Weight Bearing Restrictions Per Provider Order: No Other Position/Activity Restrictions: WBAT      Mobility  Bed Mobility Overal bed mobility: Modified Independent             General bed mobility comments: HOB up    Transfers Overall transfer level: Modified independent Equipment used: Rolling walker (2 wheels)               General transfer comment: VCs hand placement    Ambulation/Gait Ambulation/Gait assistance: Modified independent (Device/Increase time) Gait Distance (Feet): 150 Feet Assistive device: Rolling walker (2 wheels) Gait Pattern/deviations: Step-through pattern, Decreased stride length Gait velocity: WFL     General Gait Details: steady, no loss of balance  Stairs Stairs: Yes Stairs assistance: Min assist Stair Management: No rails, Backwards, With walker, Step to pattern Number of Stairs: 2 General  stair comments: spouse steadied RW, VCs sequencing, 2 steps backwards x 2 trials  Wheelchair Mobility     Tilt Bed    Modified Rankin (Stroke Patients Only)       Balance Overall balance assessment: Modified Independent                                           Pertinent Vitals/Pain Pain Assessment Pain Assessment: 0-10 Pain Score: 3  Pain Location: R hip Pain Descriptors / Indicators: Sore, Operative site guarding Pain Intervention(s): Limited activity within patient's tolerance, Monitored during session, Patient requesting pain meds-RN notified, Repositioned, Ice applied    Home Living Family/patient expects to be discharged to:: Private residence Living Arrangements: Spouse/significant other Available Help at Discharge: Family Type of Home: House Home Access: Stairs to enter Entrance Stairs-Rails: None Entrance Stairs-Number of Steps: 3   Home Layout: Two level;Able to live on main level with bedroom/bathroom Home Equipment: Rolling Walker (2 wheels)      Prior Function Prior Level of Function : Independent/Modified Independent;Driving             Mobility Comments: walked without AD, denies falls in past 6 months, plays gold ADLs Comments: independent     Extremity/Trunk Assessment   Upper Extremity Assessment Upper Extremity Assessment: Overall WFL for tasks assessed    Lower Extremity Assessment Lower Extremity Assessment: RLE deficits/detail RLE Deficits / Details: SLR at least 3/5, knee ext +4/5 RLE Sensation: WNL RLE Coordination: WNL    Cervical /  Trunk Assessment Cervical / Trunk Assessment: Normal  Communication   Communication Communication: No apparent difficulties    Cognition Arousal: Alert Behavior During Therapy: WFL for tasks assessed/performed   PT - Cognitive impairments: No apparent impairments                         Following commands: Intact       Cueing Cueing Techniques: Verbal cues      General Comments      Exercises Total Joint Exercises Ankle Circles/Pumps: AROM, Both, 10 reps, Supine Quad Sets: AROM, Both, 5 reps, Supine Short Arc Quad: AROM, Right, 5 reps, Supine Heel Slides: AROM, Right, 10 reps, Supine Hip ABduction/ADduction: AROM, Right, 10 reps, Supine Long Arc Quad: AROM, Right, 5 reps, Seated   Assessment/Plan    PT Assessment Patient does not need any further PT services  PT Problem List         PT Treatment Interventions      PT Goals (Current goals can be found in the Care Plan section)  Acute Rehab PT Goals Patient Stated Goal: golf PT Goal Formulation: All assessment and education complete, DC therapy    Frequency       Co-evaluation               AM-PAC PT 6 Clicks Mobility  Outcome Measure Help needed turning from your back to your side while in a flat bed without using bedrails?: None Help needed moving from lying on your back to sitting on the side of a flat bed without using bedrails?: None Help needed moving to and from a bed to a chair (including a wheelchair)?: None Help needed standing up from a chair using your arms (e.g., wheelchair or bedside chair)?: None Help needed to walk in hospital room?: None Help needed climbing 3-5 steps with a railing? : None 6 Click Score: 24    End of Session Equipment Utilized During Treatment: Gait belt Activity Tolerance: Patient tolerated treatment well Patient left: in bed;with nursing/sitter in room;with call bell/phone within reach Nurse Communication: Mobility status      Time: 8788-8758 PT Time Calculation (min) (ACUTE ONLY): 30 min   Charges:   PT Evaluation $PT Eval Moderate Complexity: 1 Mod PT Treatments $Gait Training: 8-22 mins PT General Charges $$ ACUTE PT VISIT: 1 Visit         Sylvan Delon Copp PT 09/17/2024  Acute Rehabilitation Services  Office [phone]

## 2024-09-17 NOTE — Anesthesia Postprocedure Evaluation (Signed)
 Anesthesia Post Note  Patient: Matthew [name] Pod  Procedure(s) Performed: ARTHROPLASTY, HIP, TOTAL, ANTERIOR APPROACH (Right: Hip)     Patient location during evaluation: PACU Anesthesia Type: Spinal Level of consciousness: awake and alert Pain management: pain level controlled Vital Signs Assessment: post-procedure vital signs reviewed and stable Respiratory status: spontaneous breathing, nonlabored ventilation and respiratory function stable Cardiovascular status: blood pressure returned to baseline Postop Assessment: no apparent nausea or vomiting, spinal receding, no headache and no backache Anesthetic complications: no   No notable events documented.  Last Vitals:  Vitals:   09/17/24 1000 09/17/24 1030  BP: (!) 109/59 126/75  Pulse: (!) 58 63  Resp: 16 18  Temp:    SpO2: 97% 95%    Last Pain:  Vitals:   09/17/24 1000  TempSrc:   PainSc: 0-No pain            L Sensory Level: S1-Sole of foot, small toes (09/17/24 1030) R Sensory Level: S1-Sole of foot, small toes (09/17/24 1030)  Vertell Row

## 2024-09-18 ENCOUNTER — Encounter (HOSPITAL_COMMUNITY): Payer: Self-pay | Admitting: Orthopaedic Surgery

## 2024-09-30 NOTE — Op Note (Signed)
 PRE-OP DIAGNOSIS:  RIGHT HIP DEGENERATIVE JOINT DISEASE POST-OP DIAGNOSIS:  same PROCEDURE: RIGHT TOTAL HIP ARTHROPLASTY ANTERIOR APPROACH ANESTHESIA:  Spinal and MAC SURGEON:  Maude Herald MD ASSISTANT:  Prentice Earl PA-C   INDICATIONS FOR PROCEDURE:  The patient is a 68 y.o. male with a long history of a painful hip.  This has persisted despite multiple conservative measures.  The patient has persisted with pain and dysfunction making rest and activity difficult.  A total hip replacement is offered as surgical treatment.  Informed operative consent was obtained after discussion of possible complications including reaction to anesthesia, infection, neurovascular injury, dislocation, DVT, PE, and death.  The importance of the postoperative rehab program to optimize result was stressed with the patient.  SUMMARY OF FINDINGS AND PROCEDURE:  Under the above anesthesia through a anterior approach an the Hana table a right THR was performed.  The patient had severe degenerative change and excellent bone quality.  We used DePuy components to replace the hip and these were size 5 high offset Actis femur capped with a +1.5 36mm ceramic hip ball.  On the acetabular side we used a size 58 Gription shell with a  plus 4 neutral polyethylene liner.  We did use a hole eliminator.  Prentice Earl PA-C assisted throughout and was invaluable to the completion of the case in that he helped position and retract while I performed the procedure.  He also closed simultaneously to help minimize OR time.  I used fluoroscopy throughout the case to check position of implants and leg lengths and read all of these views myself.  DESCRIPTION OF PROCEDURE:  The patient was taken to the OR suite where the above anesthetic was applied.  The patient was then positioned on the Hana table supine.  All bony prominences were appropriately padded.  Prep and drape was then performed in normal sterile fashion.  The patient was given kefzol   preoperative antibiotic and an appropriate time out was performed.  We then took an anterior approach to the right hip.  Dissection was taken through adipose to the tensor fascia lata fascia.  This structure was incised longitudinally and we dissected in the intermuscular interval just medial to this muscle.  Cobra retractors were placed superior and inferior to the femoral neck superficial to the capsule.  A capsular incision was then made and the retractors were placed along the femoral neck.  Xray was brought in to get a good level for the femoral neck cut which was made with an oscillating saw and osteotome.  The femoral head was removed with a corkscrew.  The acetabulum was exposed and some labral tissues were excised. Reaming was taken to the inside wall of the pelvis and sequentially up to 1 mm smaller than the actual component.  A trial of components was done and then the aforementioned acetabular shell was placed in appropriate tilt and anteversion confirmed by fluoroscopy. The liner was placed along with the hole eliminator and attention was turned to the femur.  The leg was brought down and over into adduction and the elevator bar was used to raise the femur up gently in the wound.  The piriformis was released with care taken to preserve the obturator internus attachment and all of the posterior capsule. The femur was reamed and then broached to the appropriate size.  A trial reduction was done and the aforementioned head and neck assembly gave us  the best stability in extension with external rotation.  Leg lengths were felt to be  about equal by fluoroscopic exam.  The trial components were removed and the wound irrigated.  We then placed the femoral component in appropriate anteversion.  The head was applied to a dry stem neck and the hip again reduced.  It was again stable in the aforementioned position.  The would was irrigated again followed by re-approximation of anterior capsule with ethibond  suture. Tensor fascia was repaired with V-loc suture  followed by deep closure with #O and #2 undyed vicryl.  Skin was closed with subQ stitch and steristrips followed by a sterile dressing.  EBL and IOF can be obtained from anesthesia records.  DISPOSITION:  The patient was taken to PACU in stable condition to potentially go home same day depending on ability to walk and tolerate liquids.

## 2024-10-11 ENCOUNTER — Other Ambulatory Visit: Payer: Self-pay | Admitting: Family Medicine

## 2024-10-15 ENCOUNTER — Encounter: Payer: Self-pay | Admitting: Family Medicine

## 2024-10-15 NOTE — Progress Notes (Deleted)
 [facility] [address] [place] [address] Phone: [phone] Subjective:    I'm seeing this patient by the request  of:  System, Provider Not In  CC:   YEP:Dlagzrupcz  07/03/2024 On ultrasound seems to have an infection of the DIP joint noted.  Discussed icing regimen and home exercises, discussed which activities to do and which ones to avoid.  Doxycycline  100 mg twice daily for 10 days.  Prescribed today.     Update 10/16/2024 RICE WALSH is a 68 y.o. male coming in with complaint of finger pain. THR 09/17/2024. Patient states        Past Medical History:  Diagnosis Date   Allergy    RHINITIS   Anemia    Arthritis    Bursitis of left foot 11/11/2016   Colonic polyp 09/2006   colonoscopy   Dysrhythmia    Ganglion 11/02/2017   GERD (gastroesophageal reflux disease)    Golfer's elbow, right 09/25/2019   Gout    Heart beat abnormality 12/14/2017   Heart murmur    Herpes genitalis in men 02/14/2014   Hypertension    Osteitis pubis 01/27/2016   Primary osteoarthritis of left knee 11/06/2017   Right groin pain 11/08/2018   SOB (shortness of breath)    per pt, may be coming from new medication   Subluxation of distal radial-ulnar joint 12/03/2019   Subluxation of tendon of long head of biceps 10/15/2016   Injected shoulder 12/19/2016   Subungual hematoma of foot, initial encounter 05/29/2018   SVT (supraventricular tachycardia) 03/09/2020   Past Surgical History:  Procedure Laterality Date   COLONOSCOPY  09/2006   KNEE ARTHROSCOPY Left 2011   KNEE SURGERY Left 08/2018   scar tissue surgery   LUMBAR DISC SURGERY  1990   SVT ABLATION N/A 01/09/2020   Procedure: SVT ABLATION;  Surgeon: Inocencio Soyla Lunger, MD;  Location: [facility];  Service: Cardiovascular;  Laterality: N/A;   TOTAL HIP ARTHROPLASTY Left 07/20/2021   Procedure: LEFT TOTAL HIP ARTHROPLASTY ANTERIOR APPROACH;  Surgeon: Sheril Maude, MD;  Location:  [facility];  Service: Orthopedics;  Laterality: Left;   TOTAL HIP ARTHROPLASTY Right 09/17/2024   Procedure: ARTHROPLASTY, HIP, TOTAL, ANTERIOR APPROACH;  Surgeon: Sheril Maude, MD;  Location: [facility];  Service: Orthopedics;  Laterality: Right;   TOTAL KNEE ARTHROPLASTY Left 11/06/2017   Procedure: TOTAL KNEE ARTHROPLASTY;  Surgeon: Yvone Rush, MD;  Location: [facility];  Service: Orthopedics;  Laterality: Left;   Social History   Socioeconomic History   Marital status: Married    Spouse name: Not on file   Number of children: 1   Years of education: Not on file   Highest education level: Not on file  Occupational History   Not on file  Tobacco Use   Smoking status: Some Days    Types: Cigars   Smokeless tobacco: Never   Tobacco comments:    occasional cigar  Vaping Use   Vaping status: Never Used  Substance and Sexual Activity   Alcohol use: Yes    Alcohol/week: 1.0 standard drink of alcohol    Types: 1 Glasses of wine per week    Comment: daily   Drug use: No   Sexual activity: Yes  Other Topics Concern   Not on file  Social History Narrative   Not on file   Social Drivers of Health   Tobacco Use: High Risk (09/17/2024)   Patient History    Smoking Tobacco  Use: Some Days    Smokeless Tobacco Use: Never    Passive Exposure: Not on file  Financial Resource Strain: Low Risk  (04/04/2024)   Received from [facility]   Overall Financial Resource Strain (CARDIA)    Difficulty of Paying Living Expenses: Not hard at all  Food Insecurity: No Food Insecurity (04/04/2024)   Received from [facility]   Epic    Within the past 12 months, you worried that your food would run out before you got the money to buy more.: Never true    Within the past 12 months, the food you bought just didn't last and you didn't have money to get more.: Never true  Transportation Needs: No Transportation Needs (04/04/2024)   Received from [facility] - Transportation    In the past 12 months, has lack of transportation kept you from medical appointments or from getting medications?: No    Lack of Transportation (Non-Medical): No  Physical Activity: Not on file  Stress: Not on file  Social Connections: Not on file  Depression (EYV7-0): Not on file  Alcohol Screen: Not on file  Housing: Low Risk  (04/04/2024)   Received from [facility]   Epic    In the last 12 months, was there a time when you were not able to pay the mortgage or rent on time?: No    In the past 12 months, how many times have you moved where you were living?: 0    At any time in the past 12 months, were you homeless or living in a shelter (including now)?: No  Utilities: Not At Risk (04/04/2024)   Received from [facility]    Threatened with loss of utilities: No  Health Literacy: Not on file   Allergies[1] Family History  Problem Relation Age of Onset   Stroke Father    Gout Father    Depression Brother    Dermatomyositis Daughter    Colon cancer Neg Hx    Rectal cancer Neg Hx    Esophageal cancer Neg Hx    Pancreatic cancer Neg Hx    Stomach cancer Neg Hx    Liver disease Neg Hx     Current Outpatient Medications (Endocrine & Metabolic):    predniSONE  (DELTASONE ) 20 MG tablet, Take 2 tablets (40 mg total) by mouth daily with breakfast. (Patient not taking: Reported on 09/09/2024)  Current Outpatient Medications (Cardiovascular):    diltiazem  (CARDIZEM  CD) 360 MG 24 hr capsule, TAKE 1 CAPSULE BY MOUTH EVERY DAY   flecainide (TAMBOCOR) 50 MG tablet, Take 50 mg by mouth 2 (two) times daily.   hydrALAZINE (APRESOLINE) 10 MG tablet, Take 10 mg by mouth in the morning and at bedtime.   hydrochlorothiazide  (HYDRODIURIL ) 25 MG tablet, Take 25 mg by mouth daily.   losartan  (COZAAR ) 100 MG tablet, Take 100 mg by mouth daily.   nitroGLYCERIN  (NITRO-DUR ) 0.2 mg/hr patch, Apply 1/4 of a patch to skin once  daily. (Patient not taking: Reported on 09/09/2024)   rosuvastatin  (CRESTOR ) 20 MG tablet, TAKE 1 TABLET BY MOUTH EVERY DAY  Current Outpatient Medications (Respiratory):    fexofenadine (ALLEGRA) 180 MG tablet, Take 180 mg by mouth at bedtime.    fluticasone (FLONASE) 50 MCG/ACT nasal spray, Place 2 sprays into both nostrils daily.   montelukast  (SINGULAIR ) 10 MG tablet, TAKE 1 TABLET BY MOUTH EVERY NIGHT AT BEDTIME  Current  Outpatient Medications (Analgesics):    allopurinol  (ZYLOPRIM ) 300 MG tablet, TAKE 1 TABLET BY MOUTH EVERY DAY   aspirin  EC 81 MG tablet, Take 1 tablet (81 mg total) by mouth 2 (two) times daily after a meal. Swallow whole. For 2 weeks then once a day for 2 weeks for DVT prevention.   HYDROcodone -acetaminophen  (NORCO/VICODIN) 5-325 MG tablet, Take 1-2 tablets by mouth every 6 (six) hours as needed for moderate pain (pain score 4-6) or severe pain (pain score 7-10) (post op pain).  Current Outpatient Medications (Hematological):    ferrous sulfate  325 (65 FE) MG tablet, Take 325 mg by mouth every other day.  Current Outpatient Medications (Other):    chlorhexidine  (HIBICLENS ) 4 % external liquid, Apply 15 mLs (1 Application total) topically as directed for 30 doses. Use as directed daily for 5 days every other week for 6 weeks.   cholecalciferol (VITAMIN D ) 1000 units tablet, Take 1,000 Units by mouth daily.   doxycycline  (VIBRAMYCIN ) 100 MG capsule, Take 1 capsule (100 mg total) by mouth 2 (two) times daily. (Patient not taking: Reported on 09/09/2024)   esomeprazole  (NEXIUM ) 40 MG capsule, TAKE 1 CAPSULE (40 MG TOTAL) BY MOUTH 2 (TWO) TIMES DAILY BEFORE A MEAL. TAKE ONE TABLET 30-60 MINUTES BEFORE BREAKFAST (Patient taking differently: Take 80 mg by mouth 2 (two) times daily before a meal.)   magnesium  gluconate (MAGONATE) 500 (27 Mg) MG TABS tablet, Take 500 mg by mouth at bedtime.   Multiple Vitamin (MULTIVITAMIN) tablet, Take 1 tablet by mouth daily.   mupirocin   ointment (BACTROBAN ) 2 %, Place 1 Application into the nose 2 (two) times daily for 60 doses. Use as directed 2 times daily for 5 days every other week for 6 weeks.   Omega-3 Fatty Acids (FISH OIL PO), Take 2 capsules by mouth daily.   TART CHERRY PO, Take 3 tablets by mouth daily.   tiZANidine  (ZANAFLEX ) 4 MG tablet, Take 1 tablet (4 mg total) by mouth every 6 (six) hours as needed for muscle spasms.   traZODone  (DESYREL ) 50 MG tablet, TAKE A HALF TO 1 TABLET BY MOUTH EVERY NIGHT AT BEDTIME AS NEEDED FOR SLEEP   triamcinolone  (KENALOG ) 0.1 % paste, Use as directed 1 Application in the mouth or throat daily as needed (mouth sores).   valACYclovir  (VALTREX ) 500 MG tablet, Take 500 mg by mouth daily.   Reviewed prior external information including notes and imaging from  primary care provider As well as notes that were available from care everywhere and other healthcare systems.  Past medical history, social, surgical and family history all reviewed in electronic medical record.  No pertanent information unless stated regarding to the chief complaint.   Review of Systems:  No headache, visual changes, nausea, vomiting, diarrhea, constipation, dizziness, abdominal pain, skin rash, fevers, chills, night sweats, weight loss, swollen lymph nodes, body aches, joint swelling, chest pain, shortness of breath, mood changes. POSITIVE muscle aches  Objective  There were no vitals taken for this visit.   General: No apparent distress alert and oriented x3 mood and affect normal, dressed appropriately.  HEENT: Pupils equal, extraocular movements intact  Respiratory: Patient's speak in full sentences and does not appear short of breath  Cardiovascular: No lower extremity edema, non tender, no erythema      Impression and Recommendations:           [1]  Allergies Allergen Reactions   Beta Adrenergic Blockers Other (See Comments)    Fatigue   Lisinopril  Cough  Asa [Aspirin ] Rash    High  dosage ASA causes break out   Demerol  Itching

## 2024-10-16 ENCOUNTER — Ambulatory Visit: Admitting: Family Medicine

## 2024-10-21 ENCOUNTER — Other Ambulatory Visit: Payer: Self-pay | Admitting: Family Medicine

## 2024-11-28 NOTE — Progress Notes (Unsigned)
 " [facility] [address] [place] [address] Phone: [phone] Subjective:   LILLETTE Berwyn Posey, am serving as a scribe for Dr. Arthea Claudene.  I'm seeing this patient by the request  of:  System, Provider Not In  CC: Finger pain  YEP:Dlagzrupcz  07/03/2024 On ultrasound seems to have an infection of the DIP joint noted.  Discussed icing regimen and home exercises, discussed which activities to do and which ones to avoid.  Doxycycline  100 mg twice daily for 10 days.  Prescribed today.     Patient is looking to avoid surgical intervention.  Would like to wait till February of next year.  That is 5 months away.  Fairly severe arthritis on x-rays.  Discussed with patient though about had a fracture on the contralateral side previously before the replacement.  Hoping we would not get to that stage again.  Discussed with patient about icing regimen and home exercises, discussed which activities to do and which ones to avoid.  Increase activity slowly.  Discussed icing regimen.  Will follow-up again in 6 to 8 weeks otherwise.  Can repeat injection every 10 weeks if necessary.      Update 12/04/2024 HAVIER DEEB is a 69 y.o. male coming in with complaint of B shoulder pain. L shoulder is cracking with flexion. R shoulder is painful at night in deltoid insertion when he is lying on that side.  R hand ring finger unable to make a fist due to pain in PIP. Unsure if this is coming from carpal tunnel.   Tinnitus that occurs intermittently. Feels that he has full hearing.       Past Medical History:  Diagnosis Date   Allergy    RHINITIS   Anemia    Arthritis    Bursitis of left foot 11/11/2016   Colonic polyp 09/2006   colonoscopy   Dysrhythmia    Ganglion 11/02/2017   GERD (gastroesophageal reflux disease)    Golfer's elbow, right 09/25/2019   Gout    Heart beat abnormality 12/14/2017   Heart murmur    Herpes genitalis in men 02/14/2014    Hypertension    Osteitis pubis 01/27/2016   Primary osteoarthritis of left knee 11/06/2017   Right groin pain 11/08/2018   SOB (shortness of breath)    per pt, may be coming from new medication   Subluxation of distal radial-ulnar joint 12/03/2019   Subluxation of tendon of long head of biceps 10/15/2016   Injected shoulder 12/19/2016   Subungual hematoma of foot, initial encounter 05/29/2018   SVT (supraventricular tachycardia) 03/09/2020   Past Surgical History:  Procedure Laterality Date   COLONOSCOPY  09/2006   KNEE ARTHROSCOPY Left 2011   KNEE SURGERY Left 08/2018   scar tissue surgery   LUMBAR DISC SURGERY  1990   SVT ABLATION N/A 01/09/2020   Procedure: SVT ABLATION;  Surgeon: Inocencio Soyla Lunger, MD;  Location: [facility];  Service: Cardiovascular;  Laterality: N/A;   TOTAL HIP ARTHROPLASTY Left 07/20/2021   Procedure: LEFT TOTAL HIP ARTHROPLASTY ANTERIOR APPROACH;  Surgeon: Sheril Maude, MD;  Location: [facility];  Service: Orthopedics;  Laterality: Left;   TOTAL HIP ARTHROPLASTY Right 09/17/2024   Procedure: ARTHROPLASTY, HIP, TOTAL, ANTERIOR APPROACH;  Surgeon: Sheril Maude, MD;  Location: [facility];  Service: Orthopedics;  Laterality: Right;   TOTAL KNEE ARTHROPLASTY Left 11/06/2017   Procedure: TOTAL KNEE ARTHROPLASTY;  Surgeon: Yvone Rush, MD;  Location: [facility];  Service: Orthopedics;  Laterality: Left;   Social History   Socioeconomic History   Marital status: Married    Spouse name: Not on file   Number of children: 1   Years of education: Not on file   Highest education level: Not on file  Occupational History   Not on file  Tobacco Use   Smoking status: Some Days    Types: Cigars   Smokeless tobacco: Never   Tobacco comments:    occasional cigar  Vaping Use   Vaping status: Never Used  Substance and Sexual Activity   Alcohol use: Yes    Alcohol/week: 1.0 standard drink of alcohol    Types: 1 Glasses of wine per week    Comment: daily   Drug  use: No   Sexual activity: Yes  Other Topics Concern   Not on file  Social History Narrative   Not on file   Social Drivers of Health   Tobacco Use: High Risk (10/17/2024)   Received from [facility]   Patient History    Smoking Tobacco Use: Some Days    Smokeless Tobacco Use: Never    Passive Exposure: Never  Financial Resource Strain: Low Risk  (04/04/2024)   Received from [facility]   Overall Financial Resource Strain (CARDIA)    Difficulty of Paying Living Expenses: Not hard at all  Food Insecurity: No Food Insecurity (04/04/2024)   Received from [facility]   Epic    Within the past 12 months, you worried that your food would run out before you got the money to buy more.: Never true    Within the past 12 months, the food you bought just didn't last and you didn't have money to get more.: Never true  Transportation Needs: No Transportation Needs (04/04/2024)   Received from [facility] - Transportation    In the past 12 months, has lack of transportation kept you from medical appointments or from getting medications?: No    Lack of Transportation (Non-Medical): No  Physical Activity: Not on file  Stress: Not on file  Social Connections: Not on file  Depression (EYV7-0): Not on file  Alcohol Screen: Not on file  Housing: Low Risk  (04/04/2024)   Received from [facility]   Epic    In the last 12 months, was there a time when you were not able to pay the mortgage or rent on time?: No    In the past 12 months, how many times have you moved where you were living?: 0    At any time in the past 12 months, were you homeless or living in a shelter (including now)?: No  Utilities: Not At Risk (04/04/2024)   Received from [facility] Utilities    Threatened with loss of utilities: No  Health Literacy: Not on file   Allergies[1] Family History  Problem Relation  Age of Onset   Stroke Father    Gout Father    Depression Brother    Dermatomyositis Daughter    Colon cancer Neg Hx    Rectal cancer Neg Hx    Esophageal cancer Neg Hx    Pancreatic cancer Neg Hx    Stomach cancer Neg Hx    Liver disease Neg Hx     Current Outpatient Medications (Endocrine & Metabolic):    predniSONE  (DELTASONE ) 20 MG tablet, Take 2 tablets (40 mg total) by mouth daily with  breakfast. (Patient not taking: Reported on 09/09/2024)  Current Outpatient Medications (Cardiovascular):    diltiazem  (CARDIZEM  CD) 360 MG 24 hr capsule, TAKE 1 CAPSULE BY MOUTH EVERY DAY   flecainide (TAMBOCOR) 50 MG tablet, Take 50 mg by mouth 2 (two) times daily.   hydrALAZINE (APRESOLINE) 10 MG tablet, Take 10 mg by mouth in the morning and at bedtime.   hydrochlorothiazide  (HYDRODIURIL ) 25 MG tablet, Take 25 mg by mouth daily.   losartan  (COZAAR ) 100 MG tablet, Take 100 mg by mouth daily.   rosuvastatin  (CRESTOR ) 20 MG tablet, TAKE 1 TABLET BY MOUTH EVERY DAY   nitroGLYCERIN  (NITRO-DUR ) 0.2 mg/hr patch, Apply 1/4 of a patch to skin once daily. (Patient not taking: Reported on 09/09/2024)  Current Outpatient Medications (Respiratory):    fexofenadine (ALLEGRA) 180 MG tablet, Take 180 mg by mouth at bedtime.    fluticasone (FLONASE) 50 MCG/ACT nasal spray, Place 2 sprays into both nostrils daily.   montelukast  (SINGULAIR ) 10 MG tablet, TAKE 1 TABLET BY MOUTH EVERY NIGHT AT BEDTIME  Current Outpatient Medications (Analgesics):    allopurinol  (ZYLOPRIM ) 300 MG tablet, TAKE 1 TABLET BY MOUTH EVERY DAY   meloxicam  (MOBIC ) 15 MG tablet, TAKE 1 TABLET BY MOUTH DAILY   aspirin  EC 81 MG tablet, Take 1 tablet (81 mg total) by mouth 2 (two) times daily after a meal. Swallow whole. For 2 weeks then once a day for 2 weeks for DVT prevention.   HYDROcodone -acetaminophen  (NORCO/VICODIN) 5-325 MG tablet, Take 1-2 tablets by mouth every 6 (six) hours as needed for moderate pain (pain score 4-6) or severe  pain (pain score 7-10) (post op pain).  Current Outpatient Medications (Hematological):    ferrous sulfate  325 (65 FE) MG tablet, Take 325 mg by mouth every other day.  Current Outpatient Medications (Other):    cholecalciferol (VITAMIN D ) 1000 units tablet, Take 1,000 Units by mouth daily.   esomeprazole  (NEXIUM ) 40 MG capsule, TAKE 1 CAPSULE (40 MG TOTAL) BY MOUTH 2 (TWO) TIMES DAILY BEFORE A MEAL. TAKE ONE TABLET 30-60 MINUTES BEFORE BREAKFAST (Patient taking differently: Take 80 mg by mouth 2 (two) times daily before a meal.)   magnesium  gluconate (MAGONATE) 500 (27 Mg) MG TABS tablet, Take 500 mg by mouth at bedtime.   Multiple Vitamin (MULTIVITAMIN) tablet, Take 1 tablet by mouth daily.   Omega-3 Fatty Acids (FISH OIL PO), Take 2 capsules by mouth daily.   TART CHERRY PO, Take 3 tablets by mouth daily.   traZODone  (DESYREL ) 50 MG tablet, TAKE A HALF TO 1 TABLET BY MOUTH EVERY NIGHT AT BEDTIME AS NEEDED FOR SLEEP   triamcinolone  (KENALOG ) 0.1 % paste, Use as directed 1 Application in the mouth or throat daily as needed (mouth sores).   valACYclovir  (VALTREX ) 500 MG tablet, Take 500 mg by mouth daily.   chlorhexidine  (HIBICLENS ) 4 % external liquid, Apply 15 mLs (1 Application total) topically as directed for 30 doses. Use as directed daily for 5 days every other week for 6 weeks.   doxycycline  (VIBRAMYCIN ) 100 MG capsule, Take 1 capsule (100 mg total) by mouth 2 (two) times daily. (Patient not taking: Reported on 09/09/2024)   tiZANidine  (ZANAFLEX ) 4 MG tablet, Take 1 tablet (4 mg total) by mouth every 6 (six) hours as needed for muscle spasms.   Reviewed prior external information including notes and imaging from  primary care provider As well as notes that were available from care everywhere and other healthcare systems.  Past medical history, social, surgical and  family history all reviewed in electronic medical record.  No pertanent information unless stated regarding to the chief  complaint.   Review of Systems:  No headache, visual changes, nausea, vomiting, diarrhea, constipation, dizziness, abdominal pain, skin rash, fevers, chills, night sweats, weight loss, swollen lymph nodes, body aches, joint swelling, chest pain, shortness of breath, mood changes. POSITIVE muscle aches  Objective  Blood pressure 124/86, pulse 63, height 6' 1 (1.854 m), weight 189 lb (85.7 kg), SpO2 96%.   General: No apparent distress alert and oriented x3 mood and affect normal, dressed appropriately.  HEENT: Pupils equal, extraocular movements intact  Respiratory: Patient's speak in full sentences and does not appear short of breath  Cardiovascular: No lower extremity edema, non tender, no erythema  Hip exam shows patient does have some tenderness to palpation noted normal.  Patient though is having more pain with the right shoulder.  Positive crossover noted.  Some limited range of motion noted.  Positive crossover.  Finger exam shows trigger nodule noted of the right ring finger at the A2 pulley.  Patient has some mild triggering noted today.  Neurovascularly intact distally.  Negative Tinel's noted today.  Procedure: Real-time Ultrasound Guided Injection of right acromioclavicular joint Device: GE Logiq Q7 Ultrasound guided injection is preferred based studies that show increased duration, increased effect, greater accuracy, decreased procedural pain, increased response rate, and decreased cost with ultrasound guided versus blind injection.  Verbal informed consent obtained.  Time-out conducted.  Noted no overlying erythema, induration, or other signs of local infection.  Skin prepped in a sterile fashion.  Local anesthesia: Topical Ethyl chloride.  With sterile technique and under real time ultrasound guidance: With a 25-gauge half inch needle injected with 0.5 cc of 0.5% Marcaine  and 0.5 cc of Kenalog  40 mg/mL Completed without difficulty  Pain immediately resolved suggesting  accurate placement of the medication.  Advised to call if fevers/chills, erythema, induration, drainage, or persistent bleeding.  Images saved Impression: Technically successful ultrasound guided injection.  Procedure: Real-time Ultrasound Guided Injection of right fourth flexor tendon sheath Device: GE Logiq Q7 Ultrasound guided injection is preferred based studies that show increased duration, increased effect, greater accuracy, decreased procedural pain, increased response rate, and decreased cost with ultrasound guided versus blind injection.  Verbal informed consent obtained.  Time-out conducted.  Noted no overlying erythema, induration, or other signs of local infection.  Skin prepped in a sterile fashion.  Local anesthesia: Topical Ethyl chloride.  With sterile technique and under real time ultrasound guidance: With a 25-gauge half inch needle injected with 0.5 cc of 0.5% Marcaine  and 0.5 cc of Kenalog  40 mg/mL Completed without difficulty  Pain immediately resolved suggesting accurate placement of the medication.  Advised to call if fevers/chills, erythema, induration, drainage, or persistent bleeding.  Impression: Technically successful ultrasound guided injection.   Impression and Recommendations:     The above documentation has been reviewed and is accurate and complete Arthea CHRISTELLA Sharps, DO       [1]  Allergies Allergen Reactions   Beta Adrenergic Blockers Other (See Comments)    Fatigue   Lisinopril  Cough   Asa [Aspirin ] Rash    High dosage ASA causes break out   Demerol  Itching   "

## 2024-12-04 ENCOUNTER — Encounter: Payer: Self-pay | Admitting: Family Medicine

## 2024-12-04 ENCOUNTER — Other Ambulatory Visit: Payer: Self-pay

## 2024-12-04 ENCOUNTER — Ambulatory Visit: Admitting: Family Medicine

## 2024-12-04 VITALS — BP 124/86 | HR 63 | Ht 73.0 in | Wt 189.0 lb

## 2024-12-04 DIAGNOSIS — H9313 Tinnitus, bilateral: Secondary | ICD-10-CM

## 2024-12-04 DIAGNOSIS — M65341 Trigger finger, right ring finger: Secondary | ICD-10-CM | POA: Diagnosis not present

## 2024-12-04 DIAGNOSIS — M79641 Pain in right hand: Secondary | ICD-10-CM | POA: Diagnosis not present

## 2024-12-04 DIAGNOSIS — S43003A Unspecified subluxation of unspecified shoulder joint, initial encounter: Secondary | ICD-10-CM | POA: Diagnosis not present

## 2024-12-04 DIAGNOSIS — M19011 Primary osteoarthritis, right shoulder: Secondary | ICD-10-CM | POA: Diagnosis not present

## 2024-12-04 DIAGNOSIS — H9319 Tinnitus, unspecified ear: Secondary | ICD-10-CM | POA: Insufficient documentation

## 2024-12-04 NOTE — Assessment & Plan Note (Signed)
 Injection given today, patient did tolerate extremely well previously.  Hopeful that this makes any significant movement.  Discussed potential bracing at night.  Follow-up again in 6 to 12 weeks.

## 2024-12-04 NOTE — Assessment & Plan Note (Signed)
 Referred to ENT for further evaluation for peripheral versus central.

## 2024-12-04 NOTE — Assessment & Plan Note (Signed)
 Has had difficulty before.  Does have some hypoechoic changes and we discussed that there is some bicep tendinopathy that could be contributing as well.  Discussed icing regimen and home exercises, which activities to do and which ones to avoid.  Increase activity slowly.  Discussed icing regimen.  Follow-up again in 6 to 12 weeks.

## 2024-12-04 NOTE — Patient Instructions (Signed)
 Injected R AC joint Injected L hand Keep hands in peripheral vision Arm compression sleeve See me again in 8 weeks

## 2024-12-04 NOTE — Assessment & Plan Note (Signed)
 Some increasing in hypoechoic changes again.  Discussed with patient about icing regimen and home exercises, increase activity slowly.  Follow-up again in 6 to 12 weeks.

## 2025-01-15 ENCOUNTER — Ambulatory Visit: Admitting: Family Medicine
